# Patient Record
Sex: Female | Born: 1990 | Race: White | Hispanic: No | Smoking: Never smoker
Health system: Southern US, Community
[De-identification: ages and names within clinical notes are randomized; demographics above are authoritative.]

## PROBLEM LIST (undated history)

## (undated) DIAGNOSIS — E538 Deficiency of other specified B group vitamins: Secondary | ICD-10-CM

## (undated) DIAGNOSIS — L709 Acne, unspecified: Secondary | ICD-10-CM

## (undated) DIAGNOSIS — F419 Anxiety disorder, unspecified: Secondary | ICD-10-CM

## (undated) DIAGNOSIS — L409 Psoriasis, unspecified: Secondary | ICD-10-CM

## (undated) DIAGNOSIS — T7840XA Allergy, unspecified, initial encounter: Secondary | ICD-10-CM

## (undated) DIAGNOSIS — D649 Anemia, unspecified: Secondary | ICD-10-CM

## (undated) DIAGNOSIS — E559 Vitamin D deficiency, unspecified: Secondary | ICD-10-CM

## (undated) DIAGNOSIS — F319 Bipolar disorder, unspecified: Secondary | ICD-10-CM

## (undated) DIAGNOSIS — F988 Other specified behavioral and emotional disorders with onset usually occurring in childhood and adolescence: Secondary | ICD-10-CM

## (undated) DIAGNOSIS — E282 Polycystic ovarian syndrome: Secondary | ICD-10-CM

## (undated) DIAGNOSIS — E039 Hypothyroidism, unspecified: Secondary | ICD-10-CM

## (undated) DIAGNOSIS — M2550 Pain in unspecified joint: Secondary | ICD-10-CM

## (undated) DIAGNOSIS — F1990 Other psychoactive substance use, unspecified, uncomplicated: Secondary | ICD-10-CM

## (undated) DIAGNOSIS — M7989 Other specified soft tissue disorders: Secondary | ICD-10-CM

## (undated) DIAGNOSIS — F1010 Alcohol abuse, uncomplicated: Secondary | ICD-10-CM

## (undated) DIAGNOSIS — E079 Disorder of thyroid, unspecified: Secondary | ICD-10-CM

## (undated) DIAGNOSIS — F32A Depression, unspecified: Secondary | ICD-10-CM

## (undated) HISTORY — DX: Disorder of thyroid, unspecified: E07.9

## (undated) HISTORY — DX: Allergy, unspecified, initial encounter: T78.40XA

## (undated) HISTORY — DX: Anemia, unspecified: D64.9

## (undated) HISTORY — DX: Hypothyroidism, unspecified: E03.9

## (undated) HISTORY — DX: Polycystic ovarian syndrome: E28.2

## (undated) HISTORY — DX: Bipolar disorder, unspecified: F31.9

## (undated) HISTORY — DX: Anxiety disorder, unspecified: F41.9

## (undated) HISTORY — DX: Other specified soft tissue disorders: M79.89

## (undated) HISTORY — DX: Psoriasis, unspecified: L40.9

## (undated) HISTORY — DX: Other psychoactive substance use, unspecified, uncomplicated: F19.90

## (undated) HISTORY — DX: Vitamin D deficiency, unspecified: E55.9

## (undated) HISTORY — DX: Alcohol abuse, uncomplicated: F10.10

## (undated) HISTORY — DX: Deficiency of other specified B group vitamins: E53.8

## (undated) HISTORY — DX: Other specified behavioral and emotional disorders with onset usually occurring in childhood and adolescence: F98.8

## (undated) HISTORY — DX: Acne, unspecified: L70.9

## (undated) HISTORY — DX: Pain in unspecified joint: M25.50

---
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
## 2009-03-19 ENCOUNTER — Emergency Department (HOSPITAL_COMMUNITY): Admission: EM | Admit: 2009-03-19 | Discharge: 2009-03-19 | Payer: Self-pay | Admitting: Emergency Medicine

## 2010-10-25 ENCOUNTER — Emergency Department (HOSPITAL_COMMUNITY)
Admission: EM | Admit: 2010-10-25 | Discharge: 2010-10-25 | Payer: Self-pay | Source: Home / Self Care | Admitting: Family Medicine

## 2011-03-10 LAB — CULTURE, ROUTINE-ABSCESS: Gram Stain: NONE SEEN

## 2011-04-08 LAB — URINALYSIS, ROUTINE W REFLEX MICROSCOPIC
Glucose, UA: NEGATIVE mg/dL
Hgb urine dipstick: NEGATIVE
Specific Gravity, Urine: 1.023 (ref 1.005–1.030)
Urobilinogen, UA: 1 mg/dL (ref 0.0–1.0)
pH: 6 (ref 5.0–8.0)

## 2011-04-08 LAB — DIFFERENTIAL
Basophils Absolute: 0 10*3/uL (ref 0.0–0.1)
Basophils Relative: 0 % (ref 0–1)
Monocytes Relative: 8 % (ref 3–12)
Neutro Abs: 3.4 10*3/uL (ref 1.7–7.7)
Neutrophils Relative %: 58 % (ref 43–77)

## 2011-04-08 LAB — COMPREHENSIVE METABOLIC PANEL
ALT: 10 U/L (ref 0–35)
Albumin: 3.4 g/dL — ABNORMAL LOW (ref 3.5–5.2)
Alkaline Phosphatase: 57 U/L (ref 39–117)
Glucose, Bld: 64 mg/dL — ABNORMAL LOW (ref 70–99)
Potassium: 3.7 mEq/L (ref 3.5–5.1)
Sodium: 138 mEq/L (ref 135–145)
Total Protein: 5.7 g/dL — ABNORMAL LOW (ref 6.0–8.3)

## 2011-04-08 LAB — WET PREP, GENITAL: WBC, Wet Prep HPF POC: NONE SEEN

## 2011-04-08 LAB — CBC
MCHC: 35.5 g/dL (ref 30.0–36.0)
RBC: 4.13 MIL/uL (ref 3.87–5.11)

## 2011-04-08 LAB — URINE MICROSCOPIC-ADD ON

## 2011-09-21 LAB — CULTURE, ROUTINE-ABSCESS

## 2011-10-08 LAB — CBC
MCHC: 34.9
MCV: 90.5
RBC: 4.53

## 2011-10-08 LAB — URINALYSIS, ROUTINE W REFLEX MICROSCOPIC
Bilirubin Urine: NEGATIVE
Ketones, ur: NEGATIVE
Nitrite: NEGATIVE
Urobilinogen, UA: 0.2
pH: 8

## 2011-10-08 LAB — RAPID URINE DRUG SCREEN, HOSP PERFORMED
Amphetamines: NOT DETECTED
Cocaine: NOT DETECTED
Opiates: NOT DETECTED
Tetrahydrocannabinol: NOT DETECTED

## 2011-10-08 LAB — DIFFERENTIAL
Basophils Relative: 0
Eosinophils Absolute: 0.3
Eosinophils Relative: 5
Monocytes Relative: 8
Neutrophils Relative %: 65

## 2011-10-30 ENCOUNTER — Emergency Department (HOSPITAL_COMMUNITY)
Admission: EM | Admit: 2011-10-30 | Discharge: 2011-10-30 | Disposition: A | Discharge status: Home / Self Care | Payer: Self-pay | Attending: Emergency Medicine | Admitting: Emergency Medicine

## 2011-10-30 ENCOUNTER — Emergency Department (HOSPITAL_COMMUNITY): Payer: Self-pay

## 2011-10-30 DIAGNOSIS — F341 Dysthymic disorder: Secondary | ICD-10-CM | POA: Insufficient documentation

## 2011-10-30 DIAGNOSIS — R05 Cough: Secondary | ICD-10-CM | POA: Insufficient documentation

## 2011-10-30 DIAGNOSIS — F17200 Nicotine dependence, unspecified, uncomplicated: Secondary | ICD-10-CM | POA: Insufficient documentation

## 2011-10-30 DIAGNOSIS — R059 Cough, unspecified: Secondary | ICD-10-CM | POA: Insufficient documentation

## 2011-10-30 DIAGNOSIS — R0602 Shortness of breath: Secondary | ICD-10-CM | POA: Insufficient documentation

## 2011-10-30 DIAGNOSIS — J40 Bronchitis, not specified as acute or chronic: Secondary | ICD-10-CM | POA: Insufficient documentation

## 2016-01-09 ENCOUNTER — Ambulatory Visit (INDEPENDENT_AMBULATORY_CARE_PROVIDER_SITE_OTHER): Payer: BLUE CROSS/BLUE SHIELD | Admitting: Family Medicine

## 2016-01-09 ENCOUNTER — Ambulatory Visit (INDEPENDENT_AMBULATORY_CARE_PROVIDER_SITE_OTHER): Payer: BLUE CROSS/BLUE SHIELD

## 2016-01-09 VITALS — BP 122/80 | HR 87 | Temp 98.0°F | Resp 16 | Ht 71.0 in | Wt 169.2 lb

## 2016-01-09 DIAGNOSIS — Z658 Other specified problems related to psychosocial circumstances: Secondary | ICD-10-CM

## 2016-01-09 DIAGNOSIS — R0789 Other chest pain: Secondary | ICD-10-CM

## 2016-01-09 DIAGNOSIS — R0781 Pleurodynia: Secondary | ICD-10-CM | POA: Diagnosis not present

## 2016-01-09 DIAGNOSIS — F439 Reaction to severe stress, unspecified: Secondary | ICD-10-CM

## 2016-01-09 LAB — POCT CBC
Granulocyte percent: 68.9 %G (ref 37–80)
HCT, POC: 46 % (ref 37.7–47.9)
HEMOGLOBIN: 15.8 g/dL (ref 12.2–16.2)
LYMPH, POC: 1.5 (ref 0.6–3.4)
MCH: 34 pg — AB (ref 27–31.2)
MCHC: 34.4 g/dL (ref 31.8–35.4)
MCV: 98.8 fL — AB (ref 80–97)
MID (cbc): 0.3 (ref 0–0.9)
MPV: 8.7 fL (ref 0–99.8)
PLATELET COUNT, POC: 185 10*3/uL (ref 142–424)
POC Granulocyte: 3.9 (ref 2–6.9)
POC LYMPH PERCENT: 26.1 %L (ref 10–50)
POC MID %: 5 % (ref 0–12)
RBC: 4.65 M/uL (ref 4.04–5.48)
RDW, POC: 11.4 %
WBC: 5.6 10*3/uL (ref 4.6–10.2)

## 2016-01-09 LAB — D-DIMER, QUANTITATIVE (NOT AT ARMC): D DIMER QUANT: 0.26 ug{FEU}/mL (ref 0.00–0.48)

## 2016-01-09 NOTE — Progress Notes (Signed)
Subjective:    Patient ID: Wanda Torres, female    DOB: [birth year], 25 y.o.   MRN: [id number]  HPI Wanda Torres is a 25 y.o. female  C/o stabbing chest pain on L front of chest. Now on 4th day.  NKI.  No new exercises, does Yoga everyday, but not inpast 4 days due to pain. Initially had some soreness in L arm, but that is now resolved. More sore to bend forward or lying down, but is able to press and reproduce pain pressing on upper left chest wall. Now hurts to clear throat, talking, hurts to take a deep breath. Stopped Adderall on 2nd day in case it was heart related. Did take Adderall today, but still feels fatigued. No fever. No dyspnea, just pain with deep breathe. No cough or recent URI.   Has been under a lot of stress recently.  Husband with alcohol W/D recently. Hx of anxiety and panic attacks in past, but current sx's feel different. No SI/HI, just stressed. Drank 1/2 bottle wine last night, but usually only 2-3 per week, no hx of DUI or problems with alcohol. No illicit drug use.   No recent prolonged car travel, but flew to [place] (2 and a half hours) 1 week ago, no recent calf pain or swelling.no hx of DVT. No OCP's, but is smoker.    Tx: ibuprofen - 855m once initially, then 8040mup to every 6 hours.  Helps some.    Review of Systems  Constitutional: Negative for fever and chills.  HENT: Negative for congestion.   Respiratory: Negative for cough, chest tightness and shortness of breath.   Cardiovascular: Positive for chest pain (sharp). Negative for palpitations and leg swelling.  Musculoskeletal: Positive for arthralgias (l arm initailly, not now. ).       Objective:   Physical Exam  Constitutional: She is oriented to person, place, and time. She appears well-developed and well-nourished.  HENT:  Head: Normocephalic and atraumatic.  Eyes: Conjunctivae and EOM are normal. Pupils are equal, round, and reactive to light.  Neck: Normal range of motion.  Carotid bruit is not present.  Cardiovascular: Normal rate, regular rhythm, normal heart sounds and intact distal pulses.   Pulmonary/Chest: Effort normal and breath sounds normal. She exhibits tenderness. She exhibits no crepitus, no deformity and no swelling.    Abdominal: Soft. She exhibits no pulsatile midline mass. There is no tenderness.  Musculoskeletal:       Cervical back: She exhibits normal range of motion, no tenderness and no bony tenderness.  Calves nt, negative homan's, no LE edema.   Neurological: She is alert and oriented to person, place, and time.  Skin: Skin is warm and dry.  Psychiatric: She has a normal mood and affect. Her behavior is normal.  Vitals reviewed.  EKG: SR, no acute findings.   [facility] reading (PRIMARY) by  Dr. GrCarlota Raspberry CXR: no apparent pneumothorax or acute findings.      Assessment & Plan:   Wanda KAMAs a [age].o. female Pleuritic chest pain - Plan: EKG 12-Lead, DG Chest 2 View, POCT CBC, Sedimentation Rate, D-dimer, [facility] Chest wall pain - Plan: EKG 12-Lead, DG Chest 2 View, POCT CBC, Sedimentation Rate, D[facility]dimer, [facility] Situational stress  Appears to be chest wall pain as reproducible in office. D dimer and ESR were ultimately normal range, and EKG, CXR appeared ok.  Doubt pericarditis, PE, or cardiac cause.   -symptomatic  care discussed with heat or ice and advil otc - dosing discussed and to take with food.   -rtc/er precautions discussed if persistent or worsening  -situational stressors at home, but appears to be coping ok overall - advised to rtc to discuss further if needed.   Meds ordered this encounter  Medications  . amphetamine-dextroamphetamine (ADDERALL) 10 MG tablet    Sig: Take 10 mg by mouth daily with breakfast.   Patient Instructions  I suspect your pain is coming form the chest wall, but will check some blood tests for other conditions as we discussed.  Heat or ice to  affected area if it feels better, ibuprofen 600-863m every 6-8 hours (take with food), relative rest from upper body exercises for now.  We will call you about the blood test results, and if other testing needed will let you know.  Return to the clinic or go to the nearest emergency room if any of your symptoms worsen or new symptoms occur.  Chest Wall Pain Chest wall pain is pain in or around the bones and muscles of your chest. Sometimes, an injury causes this pain. Sometimes, the cause may not be known. This pain may take several weeks or longer to get better. HOME CARE INSTRUCTIONS  Pay attention to any changes in your symptoms. Take these actions to help with your pain:   Rest as told by your health care provider.   Avoid activities that cause pain. These include any activities that use your chest muscles or your abdominal and side muscles to lift heavy items.   If directed, apply ice to the painful area:  Put ice in a plastic bag.  Place a towel between your skin and the bag.  Leave the ice on for 20 minutes, 2-3 times per day.  Take over-the-counter and prescription medicines only as told by your health care provider.  Do not use tobacco products, including cigarettes, chewing tobacco, and e-cigarettes. If you need help quitting, ask your health care provider.  Keep all follow-up visits as told by your health care provider. This is important. SEEK MEDICAL CARE IF:  You have a fever.  Your chest pain becomes worse.  You have new symptoms. SEEK IMMEDIATE MEDICAL CARE IF:  You have nausea or vomiting.  You feel sweaty or light-headed.  You have a cough with phlegm (sputum) or you cough up blood.  You develop shortness of breath.   This information is not intended to replace advice given to you by your health care provider. Make sure you discuss any questions you have with your health care provider.   Document Released: 12/13/2005 Document Revised: 09/03/2015  Document Reviewed: 03/10/2015 Elsevier Interactive Patient Education [employer]     I personally performed the services described in this documentation, which was scribed in my presence. The recorded information has been reviewed and considered, and addended by me as needed.

## 2016-01-09 NOTE — Patient Instructions (Addendum)
I suspect your pain is coming form the chest wall, but will check some blood tests for other conditions as we discussed.  Heat or ice to affected area if it feels better, ibuprofen 600-800mg  every 6-8 hours (take with food), relative rest from upper body exercises for now.  We will call you about the blood test results, and if other testing needed will let you know.  Return to the clinic or go to the nearest emergency room if any of your symptoms worsen or new symptoms occur.  Chest Wall Pain Chest wall pain is pain in or around the bones and muscles of your chest. Sometimes, an injury causes this pain. Sometimes, the cause may not be known. This pain may take several weeks or longer to get better. HOME CARE INSTRUCTIONS  Pay attention to any changes in your symptoms. Take these actions to help with your pain:   Rest as told by your health care provider.   Avoid activities that cause pain. These include any activities that use your chest muscles or your abdominal and side muscles to lift heavy items.   If directed, apply ice to the painful area:  Put ice in a plastic bag.  Place a towel between your skin and the bag.  Leave the ice on for 20 minutes, 2-3 times per day.  Take over-the-counter and prescription medicines only as told by your health care provider.  Do not use tobacco products, including cigarettes, chewing tobacco, and e-cigarettes. If you need help quitting, ask your health care provider.  Keep all follow-up visits as told by your health care provider. This is important. SEEK MEDICAL CARE IF:  You have a fever.  Your chest pain becomes worse.  You have new symptoms. SEEK IMMEDIATE MEDICAL CARE IF:  You have nausea or vomiting.  You feel sweaty or light-headed.  You have a cough with phlegm (sputum) or you cough up blood.  You develop shortness of breath.   This information is not intended to replace advice given to you by your health care provider. Make  sure you discuss any questions you have with your health care provider.   Document Released: 12/13/2005 Document Revised: 09/03/2015 Document Reviewed: 03/10/2015 Elsevier Interactive Patient Education [employer]

## 2016-01-10 LAB — SEDIMENTATION RATE: Sed Rate: 3 mm/hr (ref 0–20)

## 2016-01-26 ENCOUNTER — Encounter: Payer: Self-pay | Admitting: Family Medicine

## 2016-01-26 ENCOUNTER — Ambulatory Visit (INDEPENDENT_AMBULATORY_CARE_PROVIDER_SITE_OTHER): Payer: BLUE CROSS/BLUE SHIELD | Admitting: Family Medicine

## 2016-01-26 ENCOUNTER — Ambulatory Visit: Payer: Self-pay | Admitting: Family Medicine

## 2016-01-26 VITALS — BP 114/82 | HR 92 | Temp 98.4°F | Resp 16 | Ht 71.75 in | Wt 168.8 lb

## 2016-01-26 DIAGNOSIS — F418 Other specified anxiety disorders: Secondary | ICD-10-CM

## 2016-01-26 DIAGNOSIS — G4700 Insomnia, unspecified: Secondary | ICD-10-CM

## 2016-01-26 DIAGNOSIS — E282 Polycystic ovarian syndrome: Secondary | ICD-10-CM

## 2016-01-26 DIAGNOSIS — L709 Acne, unspecified: Secondary | ICD-10-CM

## 2016-01-26 MED ORDER — CLONAZEPAM 0.5 MG PO TABS: 0.2500 mg | ORAL_TABLET | Freq: Every evening | ORAL | Status: DC | PRN

## 2016-01-26 MED ORDER — SPIRONOLACTONE 25 MG PO TABS: 25.0000 mg | ORAL_TABLET | Freq: Every day | ORAL | Status: DC

## 2016-01-26 NOTE — Progress Notes (Signed)
Subjective:  By signing my name below, I, Wanda Torres, attest that this documentation has been prepared under the direction and in the presence of Wanda Ray, MD. Electronically Signed: Moises Torres, [place]. 01/26/2016 , 5:28 PM .  Patient was seen in Room 26 .   Patient ID: Wanda Torres, female    DOB: 08-01-1991, 25 y.o.   MRN: [id number] Chief Complaint  Patient presents with  . Follow-up  . Anxiety    "no better", can't sleep  . have taken melantonin, not working  . depression scale during triage    score 14   HPI Wanda Torres is a 25 y.o. female Here for follow up. Last seen by me approximately 3 weeks ago for situational stress and chest wall pain. Suspected chest wall pain at that time with normal d-dimer, normal sed rate, reassuring EKG and chest xray. Based on our discussion at that time, she is coping okay with her stressors. Today, here for worsening symptoms of anxiety and difficulty sleeping.   Anxiety She's taken melatonin without any relief. She's still having situational shortness of breath and chest wall pain. She's been stressed due to getting her husband to treatment at [facility]. She had to move out to pay for her husband's treatment. She's currently staying with her friends and has had trouble sleeping. She tends to stay up until around 4:00AM. She's tried to meditation (via Calm App), melatonin, yoga, and walking her dogs without relief at the moment. She has an appointment in 2 weeks, covered by [employer]. She has coffee in the morning. She denies naps during the day.   Medications She denies taking xanax, or klonopin in the past.  She's taken [place] in the past when she had her daughter.  She's still on adderall.   She works at [employer], Librarian, [place]   Depression Depression screen [facility] 2/9 01/26/2016 01/09/2016  Decreased Interest 1 0  Down, Depressed, Hopeless 3 0  PHQ - 2 Score 4 0  Altered sleeping 3 -  Tired,  decreased energy 0 -  Change in appetite 1 -  Feeling bad or failure about yourself  1 -  Trouble concentrating 2 -  Moving slowly or fidgety/restless 3 -  Suicidal thoughts 0 -  PHQ-9 Score 14 -  Difficult doing work/chores Very difficult -   She denies SI and HI. She denies illicit drug use.   Acne She took spironolactone for acne and facial hair 6 years ago. She's on PCS and her periods are normal.   There are no active problems to display for this patient.  Past Medical History  Diagnosis Date  . Allergy   . Anemia   . Anxiety   . Thyroid disease    No past surgical history on file. No Known Allergies Prior to Admission medications   Medication Sig Start Date End Date Taking? Authorizing Provider  amphetamine-dextroamphetamine (ADDERALL) 10 MG tablet Take 10 mg by mouth daily with breakfast.    Historical Provider, MD   Social History   Social History  . Marital Status: Single    Spouse Name: N/A  . Number of Children: N/A  . Years of Education: N/A   Occupational History  . Not on file.   Social History Main Topics  . Smoking status: Never Smoker   . Smokeless tobacco: Not on file  . Alcohol Use: Not on file  . Drug Use: Not on file  . Sexual Activity: Not on file  Other Topics Concern  . Not on file   Social History Narrative   Review of Systems  Constitutional: Positive for fatigue. Negative for fever and chills.  Gastrointestinal: Negative for nausea and vomiting.  Skin: Positive for rash. Negative for wound.  Neurological: Negative for dizziness, weakness, numbness and headaches.  Psychiatric/Behavioral: Positive for sleep disturbance. Negative for suicidal ideas and self-injury.       Objective:   Physical Exam  Constitutional: She is oriented to person, place, and time. She appears well-developed and well-nourished. No distress.  HENT:  Head: Normocephalic and atraumatic.  Eyes: EOM are normal. Pupils are equal, round, and reactive to  light.  Neck: Neck supple.  Cardiovascular: Normal rate.   Pulmonary/Chest: Effort normal. No respiratory distress.  Musculoskeletal: Normal range of motion.  Neurological: She is alert and oriented to person, place, and time.  Skin: Skin is warm and dry.  Few scratched excoriated comedones, cystic appearance on face bilaterally, nothing on upper back or chest, no other rash  Psychiatric: She has a normal mood and affect. Her behavior is normal.  Nursing note and vitals reviewed.   Filed Vitals:   01/26/16 1631  BP: 114/82  Pulse: 92  Temp: 98.4 F (36.9 C)  TempSrc: Oral  Resp: 16  Height: 5' 11.75" (1.822 m)  Weight: 168 lb 12.8 oz (76.567 kg)  SpO2: 98%      Assessment & Plan:   AMMI HUTT is a 25 y.o. female Insomnia - Plan: clonazePAM (KLONOPIN) 0.5 MG tablet Situational anxiety - Plan: clonazePAM (KLONOPIN) 0.5 MG tablet  - situational stressor wit husband in alcohol rehab and new living situation.   -continue to try relaxation techniques, yoga, exercise in afternoons, avoid afternoon caffeine.   -follow up with therapist as planned, [place] meeting  -short term klonopin at bedtime discussed, taper off as soon as sx's improve.   -follow up in 1 month to decide if daily med needed.   PCOS (polycystic ovarian syndrome) - Plan: spironolactone (ALDACTONE) 25 MG tablet Acne, unspecified acne type - Plan: spironolactone (ALDACTONE) 25 MG tablet  - discussed OCP as first line, but HA's with ocp's and tolerated spirinolactone in past.   -start low dose spirinolactone, SED, orthostatic/hypotension precautions. May need to increase dose as 36m and up may be needed for PCOS sx's.   - discuss further next OV.     Meds ordered this encounter  Medications  . clonazePAM (KLONOPIN) 0.5 MG tablet    Sig: Take 0.5-1 tablets (0.25-0.5 mg total) by mouth at bedtime as needed for anxiety.    Dispense:  15 tablet    Refill:  0  . spironolactone (ALDACTONE) 25 MG tablet     Sig: Take 1 tablet (25 mg total) by mouth daily.    Dispense:  30 tablet    Refill:  1   Patient Instructions  Check into dose of spirinolactone previously used, but start with 1/2 pill per day for first week, then full pill per day.  Klonopin at bedtime if needed, but keep appointment with therapist as planned and try other techniques for insomnia.  recheck in next month. sooner if worse.  Insomnia Insomnia is a sleep disorder that makes it difficult to fall asleep or to stay asleep. Insomnia can cause tiredness (fatigue), low energy, difficulty concentrating, mood swings, and poor performance at work or school.  There are three different ways to classify insomnia:  Difficulty falling asleep.  Difficulty staying asleep.  Waking up too early in  the morning. Any type of insomnia can be long-term (chronic) or short-term (acute). Both are common. Short-term insomnia usually lasts for three months or less. Chronic insomnia occurs at least three times a week for longer than three months. CAUSES  Insomnia may be caused by another condition, situation, or substance, such as:  Anxiety.  Certain medicines.  Gastroesophageal reflux disease (GERD) or other gastrointestinal conditions.  Asthma or other breathing conditions.  Restless legs syndrome, sleep apnea, or other sleep disorders.  Chronic pain.  Menopause. This may include hot flashes.  Stroke.  Abuse of alcohol, tobacco, or illegal drugs.  Depression.  Caffeine.   Neurological disorders, such as Alzheimer disease.  An overactive thyroid (hyperthyroidism). The cause of insomnia may not be known. RISK FACTORS Risk factors for insomnia include:  Gender. Women are more commonly affected than men.  Age. Insomnia is more common as you get older.  Stress. This may involve your professional or personal life.  Income. Insomnia is more common in people with lower income.  Lack of exercise.   Irregular work schedule  or night shifts.  Traveling between different time zones. SIGNS AND SYMPTOMS If you have insomnia, trouble falling asleep or trouble staying asleep is the main symptom. This may lead to other symptoms, such as:  Feeling fatigued.  Feeling nervous about going to sleep.  Not feeling rested in the morning.  Having trouble concentrating.  Feeling irritable, anxious, or depressed. TREATMENT  Treatment for insomnia depends on the cause. If your insomnia is caused by an underlying condition, treatment will focus on addressing the condition. Treatment may also include:   Medicines to help you sleep.  Counseling or therapy.  Lifestyle adjustments. HOME CARE INSTRUCTIONS   Take medicines only as directed by your health care provider.  Keep regular sleeping and waking hours. Avoid naps.  Keep a sleep diary to help you and your health care provider figure out what could be causing your insomnia. Include:   When you sleep.  When you wake up during the night.  How well you sleep.   How rested you feel the next day.  Any side effects of medicines you are taking.  What you eat and drink.   Make your bedroom a comfortable place where it is easy to fall asleep:  Put up shades or special blackout curtains to block light from outside.  Use a white noise machine to block noise.  Keep the temperature cool.   Exercise regularly as directed by your health care provider. Avoid exercising right before bedtime.  Use relaxation techniques to manage stress. Ask your health care provider to suggest some techniques that may work well for you. These may include:  Breathing exercises.  Routines to release muscle tension.  Visualizing peaceful scenes.  Cut back on alcohol, caffeinated beverages, and cigarettes, especially close to bedtime. These can disrupt your sleep.  Do not overeat or eat spicy foods right before bedtime. This can lead to digestive discomfort that can make it hard  for you to sleep.  Limit screen use before bedtime. This includes:  Watching TV.  Using your smartphone, tablet, and computer.  Stick to a routine. This can help you fall asleep faster. Try to do a quiet activity, brush your teeth, and go to bed at the same time each night.  Get out of bed if you are still awake after 15 minutes of trying to sleep. Keep the lights down, but try reading or doing a quiet activity. When you feel  sleepy, go back to bed.  Make sure that you drive carefully. Avoid driving if you feel very sleepy.  Keep all follow-up appointments as directed by your health care provider. This is important. SEEK MEDICAL CARE IF:   You are tired throughout the day or have trouble in your daily routine due to sleepiness.  You continue to have sleep problems or your sleep problems get worse. SEEK IMMEDIATE MEDICAL CARE IF:   You have serious thoughts about hurting yourself or someone else.   This information is not intended to replace advice given to you by your health care provider. Make sure you discuss any questions you have with your health care provider.   Document Released: 12/10/2000 Document Revised: 09/03/2015 Document Reviewed: 09/13/2014 Elsevier Interactive Patient Education 2016 [place] and Stress Management Stress is a normal reaction to life events. It is what you feel when life demands more than you are used to or more than you can handle. Some stress can be useful. For example, the stress reaction can help you catch the last bus of the day, study for a test, or meet a deadline at work. But stress that occurs too often or for too long can cause problems. It can affect your emotional health and interfere with relationships and normal daily activities. Too much stress can weaken your immune system and increase your risk for physical illness. If you already have a medical problem, stress can make it worse. CAUSES  All sorts of life events may cause  stress. An event that causes stress for one person may not be stressful for another person. Major life events commonly cause stress. These may be positive or negative. Examples include losing your job, moving into a new home, getting married, having a baby, or losing a loved one. Less obvious life events may also cause stress, especially if they occur day after day or in combination. Examples include working long hours, driving in traffic, caring for children, being in debt, or being in a difficult relationship. SIGNS AND SYMPTOMS Stress may cause emotional symptoms including, the following:  Anxiety. This is feeling worried, afraid, on edge, overwhelmed, or out of control.  Anger. This is feeling irritated or impatient.  Depression. This is feeling sad, down, helpless, or guilty.  Difficulty focusing, remembering, or making decisions. Stress may cause physical symptoms, including the following:   Aches and pains. These may affect your head, neck, back, stomach, or other areas of your body.  Tight muscles or clenched jaw.  Low energy or trouble sleeping. Stress may cause unhealthy behaviors, including the following:   Eating to feel better (overeating) or skipping meals.  Sleeping too little, too much, or both.  Working too much or putting off tasks (procrastination).  Smoking, drinking alcohol, or using drugs to feel better. DIAGNOSIS  Stress is diagnosed through an assessment by your health care provider. Your health care provider will ask questions about your symptoms and any stressful life events.Your health care provider will also ask about your medical history and may order Torres tests or other tests. Certain medical conditions and medicine can cause physical symptoms similar to stress. Mental illness can cause emotional symptoms and unhealthy behaviors similar to stress. Your health care provider may refer you to a mental health professional for further evaluation.  TREATMENT    Stress management is the recommended treatment for stress.The goals of stress management are reducing stressful life events and coping with stress in healthy ways.  Techniques for reducing  stressful life events include the following:  Stress identification. Self-monitor for stress and identify what causes stress for you. These skills may help you to avoid some stressful events.  Time management. Set your priorities, keep a calendar of events, and learn to say "no." These tools can help you avoid making too many commitments. Techniques for coping with stress include the following:  Rethinking the problem. Try to think realistically about stressful events rather than ignoring them or overreacting. Try to find the positives in a stressful situation rather than focusing on the negatives.  Exercise. Physical exercise can release both physical and emotional tension. The key is to find a form of exercise you enjoy and do it regularly.  Relaxation techniques. These relax the body and mind. Examples include yoga, meditation, tai chi, biofeedback, deep breathing, progressive muscle relaxation, listening to music, being out in nature, journaling, and other hobbies. Again, the key is to find one or more that you enjoy and can do regularly.  Healthy lifestyle. Eat a balanced diet, get plenty of sleep, and do not smoke. Avoid using alcohol or drugs to relax.  Strong support network. Spend time with family, friends, or other people you enjoy being around.Express your feelings and talk things over with someone you trust. Counseling or talktherapy with a mental health professional may be helpful if you are having difficulty managing stress on your own. Medicine is typically not recommended for the treatment of stress.Talk to your health care provider if you think you need medicine for symptoms of stress. HOME CARE INSTRUCTIONS  Keep all follow-up visits as directed by your health care provider.  Take all  medicines as directed by your health care provider. SEEK MEDICAL CARE IF:  Your symptoms get worse or you start having new symptoms.  You feel overwhelmed by your problems and can no longer manage them on your own. SEEK IMMEDIATE MEDICAL CARE IF:  You feel like hurting yourself or someone else.   This information is not intended to replace advice given to you by your health care provider. Make sure you discuss any questions you have with your health care provider.   Document Released: 06/08/2001 Document Revised: 01/03/2015 Document Reviewed: 08/07/2013 Elsevier Interactive Patient Education [employer].     I personally performed the services described in this documentation, which was scribed in my presence. The recorded information has been reviewed and considered, and addended by me as needed.

## 2016-01-26 NOTE — Patient Instructions (Signed)
Check into dose of spirinolactone previously used, but start with 1/2 pill per day for first week, then full pill per day.  Klonopin at bedtime if needed, but keep appointment with therapist as planned and try other techniques for insomnia.  recheck in next month. sooner if worse.  Insomnia Insomnia is a sleep disorder that makes it difficult to fall asleep or to stay asleep. Insomnia can cause tiredness (fatigue), low energy, difficulty concentrating, mood swings, and poor performance at work or school.  There are three different ways to classify insomnia:  Difficulty falling asleep.  Difficulty staying asleep.  Waking up too early in the morning. Any type of insomnia can be long-term (chronic) or short-term (acute). Both are common. Short-term insomnia usually lasts for three months or less. Chronic insomnia occurs at least three times a week for longer than three months. CAUSES  Insomnia may be caused by another condition, situation, or substance, such as:  Anxiety.  Certain medicines.  Gastroesophageal reflux disease (GERD) or other gastrointestinal conditions.  Asthma or other breathing conditions.  Restless legs syndrome, sleep apnea, or other sleep disorders.  Chronic pain.  Menopause. This may include hot flashes.  Stroke.  Abuse of alcohol, tobacco, or illegal drugs.  Depression.  Caffeine.   Neurological disorders, such as Alzheimer disease.  An overactive thyroid (hyperthyroidism). The cause of insomnia may not be known. RISK FACTORS Risk factors for insomnia include:  Gender. Women are more commonly affected than men.  Age. Insomnia is more common as you get older.  Stress. This may involve your professional or personal life.  Income. Insomnia is more common in people with lower income.  Lack of exercise.   Irregular work schedule or night shifts.  Traveling between different time zones. SIGNS AND SYMPTOMS If you have insomnia, trouble  falling asleep or trouble staying asleep is the main symptom. This may lead to other symptoms, such as:  Feeling fatigued.  Feeling nervous about going to sleep.  Not feeling rested in the morning.  Having trouble concentrating.  Feeling irritable, anxious, or depressed. TREATMENT  Treatment for insomnia depends on the cause. If your insomnia is caused by an underlying condition, treatment will focus on addressing the condition. Treatment may also include:   Medicines to help you sleep.  Counseling or therapy.  Lifestyle adjustments. HOME CARE INSTRUCTIONS   Take medicines only as directed by your health care provider.  Keep regular sleeping and waking hours. Avoid naps.  Keep a sleep diary to help you and your health care provider figure out what could be causing your insomnia. Include:   When you sleep.  When you wake up during the night.  How well you sleep.   How rested you feel the next day.  Any side effects of medicines you are taking.  What you eat and drink.   Make your bedroom a comfortable place where it is easy to fall asleep:  Put up shades or special blackout curtains to block light from outside.  Use a white noise machine to block noise.  Keep the temperature cool.   Exercise regularly as directed by your health care provider. Avoid exercising right before bedtime.  Use relaxation techniques to manage stress. Ask your health care provider to suggest some techniques that may work well for you. These may include:  Breathing exercises.  Routines to release muscle tension.  Visualizing peaceful scenes.  Cut back on alcohol, caffeinated beverages, and cigarettes, especially close to bedtime. These can disrupt your sleep.  Do not overeat or eat spicy foods right before bedtime. This can lead to digestive discomfort that can make it hard for you to sleep.  Limit screen use before bedtime. This includes:  Watching TV.  Using your smartphone,  tablet, and computer.  Stick to a routine. This can help you fall asleep faster. Try to do a quiet activity, brush your teeth, and go to bed at the same time each night.  Get out of bed if you are still awake after 15 minutes of trying to sleep. Keep the lights down, but try reading or doing a quiet activity. When you feel sleepy, go back to bed.  Make sure that you drive carefully. Avoid driving if you feel very sleepy.  Keep all follow-up appointments as directed by your health care provider. This is important. SEEK MEDICAL CARE IF:   You are tired throughout the day or have trouble in your daily routine due to sleepiness.  You continue to have sleep problems or your sleep problems get worse. SEEK IMMEDIATE MEDICAL CARE IF:   You have serious thoughts about hurting yourself or someone else.   This information is not intended to replace advice given to you by your health care provider. Make sure you discuss any questions you have with your health care provider.   Document Released: 12/10/2000 Document Revised: 09/03/2015 Document Reviewed: 09/13/2014 Elsevier Interactive Patient Education 2016 [place] and Stress Management Stress is a normal reaction to life events. It is what you feel when life demands more than you are used to or more than you can handle. Some stress can be useful. For example, the stress reaction can help you catch the last bus of the day, study for a test, or meet a deadline at work. But stress that occurs too often or for too long can cause problems. It can affect your emotional health and interfere with relationships and normal daily activities. Too much stress can weaken your immune system and increase your risk for physical illness. If you already have a medical problem, stress can make it worse. CAUSES  All sorts of life events may cause stress. An event that causes stress for one person may not be stressful for another person. Major life events  commonly cause stress. These may be positive or negative. Examples include losing your job, moving into a new home, getting married, having a baby, or losing a loved one. Less obvious life events may also cause stress, especially if they occur day after day or in combination. Examples include working long hours, driving in traffic, caring for children, being in debt, or being in a difficult relationship. SIGNS AND SYMPTOMS Stress may cause emotional symptoms including, the following:  Anxiety. This is feeling worried, afraid, on edge, overwhelmed, or out of control.  Anger. This is feeling irritated or impatient.  Depression. This is feeling sad, down, helpless, or guilty.  Difficulty focusing, remembering, or making decisions. Stress may cause physical symptoms, including the following:   Aches and pains. These may affect your head, neck, back, stomach, or other areas of your body.  Tight muscles or clenched jaw.  Low energy or trouble sleeping. Stress may cause unhealthy behaviors, including the following:   Eating to feel better (overeating) or skipping meals.  Sleeping too little, too much, or both.  Working too much or putting off tasks (procrastination).  Smoking, drinking alcohol, or using drugs to feel better. DIAGNOSIS  Stress is diagnosed through an assessment by your health  care provider. Your health care provider will ask questions about your symptoms and any stressful life events.Your health care provider will also ask about your medical history and may order blood tests or other tests. Certain medical conditions and medicine can cause physical symptoms similar to stress. Mental illness can cause emotional symptoms and unhealthy behaviors similar to stress. Your health care provider may refer you to a mental health professional for further evaluation.  TREATMENT  Stress management is the recommended treatment for stress.The goals of stress management are reducing  stressful life events and coping with stress in healthy ways.  Techniques for reducing stressful life events include the following:  Stress identification. Self-monitor for stress and identify what causes stress for you. These skills may help you to avoid some stressful events.  Time management. Set your priorities, keep a calendar of events, and learn to say "no." These tools can help you avoid making too many commitments. Techniques for coping with stress include the following:  Rethinking the problem. Try to think realistically about stressful events rather than ignoring them or overreacting. Try to find the positives in a stressful situation rather than focusing on the negatives.  Exercise. Physical exercise can release both physical and emotional tension. The key is to find a form of exercise you enjoy and do it regularly.  Relaxation techniques. These relax the body and mind. Examples include yoga, meditation, tai chi, biofeedback, deep breathing, progressive muscle relaxation, listening to music, being out in nature, journaling, and other hobbies. Again, the key is to find one or more that you enjoy and can do regularly.  Healthy lifestyle. Eat a balanced diet, get plenty of sleep, and do not smoke. Avoid using alcohol or drugs to relax.  Strong support network. Spend time with family, friends, or other people you enjoy being around.Express your feelings and talk things over with someone you trust. Counseling or talktherapy with a mental health professional may be helpful if you are having difficulty managing stress on your own. Medicine is typically not recommended for the treatment of stress.Talk to your health care provider if you think you need medicine for symptoms of stress. HOME CARE INSTRUCTIONS  Keep all follow-up visits as directed by your health care provider.  Take all medicines as directed by your health care provider. SEEK MEDICAL CARE IF:  Your symptoms get worse or  you start having new symptoms.  You feel overwhelmed by your problems and can no longer manage them on your own. SEEK IMMEDIATE MEDICAL CARE IF:  You feel like hurting yourself or someone else.   This information is not intended to replace advice given to you by your health care provider. Make sure you discuss any questions you have with your health care provider.   Document Released: 06/08/2001 Document Revised: 01/03/2015 Document Reviewed: 08/07/2013 Elsevier Interactive Patient Education [employer].

## 2016-02-13 ENCOUNTER — Telehealth: Payer: Self-pay

## 2016-02-13 NOTE — Telephone Encounter (Signed)
Pt needs a refill on Adderall  Please advise when ready  [phone]

## 2016-02-16 NOTE — Telephone Encounter (Signed)
Dr Neva Seat, I see in your OV notes from 1/30 that you noted that pt is taking Adderall, but we have not Rxd it for pt before. Did you discuss Rxing this for pt or does pt need to RTC now to discuss this rather than waiting for f/up that you planned for end of month?

## 2016-02-16 NOTE — Telephone Encounter (Signed)
We have not been prescribing that medicine. I believe she had a different provider for Adderall.

## 2016-02-17 NOTE — Telephone Encounter (Signed)
Left message we have not prescribed the adderall,

## 2016-02-19 ENCOUNTER — Ambulatory Visit (INDEPENDENT_AMBULATORY_CARE_PROVIDER_SITE_OTHER): Payer: BLUE CROSS/BLUE SHIELD | Admitting: Family Medicine

## 2016-02-19 VITALS — BP 102/72 | HR 81 | Temp 98.7°F | Resp 16 | Ht 71.0 in | Wt 168.6 lb

## 2016-02-19 DIAGNOSIS — F39 Unspecified mood [affective] disorder: Secondary | ICD-10-CM

## 2016-02-19 DIAGNOSIS — L709 Acne, unspecified: Secondary | ICD-10-CM | POA: Diagnosis not present

## 2016-02-19 DIAGNOSIS — E282 Polycystic ovarian syndrome: Secondary | ICD-10-CM | POA: Diagnosis not present

## 2016-02-19 MED ORDER — AMPHETAMINE-DEXTROAMPHETAMINE 10 MG PO TABS: 20.0000 mg | ORAL_TABLET | Freq: Every day | ORAL | Status: DC

## 2016-02-19 MED ORDER — SPIRONOLACTONE 25 MG PO TABS: 25.0000 mg | ORAL_TABLET | Freq: Every day | ORAL | Status: DC

## 2016-02-19 NOTE — Patient Instructions (Signed)
Let us know when you have run out of your medicine so we can refill it.

## 2016-02-19 NOTE — Progress Notes (Signed)
By signing my name below, I, Stann Ore, attest that this documentation has been prepared under the direction and in the presence of Elvina Sidle, MD. Electronically Signed: Stann Ore, Scribe. 02/19/2016 , 4:36 PM .  Patient was seen in room 9 .   Patient ID: Wanda Torres MRN: [id number], DOB: 06/10/1991, 25 y.o. Date of Encounter: 02/19/2016  Primary Physician: No primary care provider on file.  Chief Complaint:  Chief Complaint  Patient presents with  . Medication Refill    adderall    HPI:  Wanda Torres is a 25 y.o. female who presents to [facility] for medication refill of adderall. She has  tablets, and takes 2 daily on days she works. She just takes 1 tablet on days she doesn't work.   She also takes spironolactone for her acne.   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   Past Medical History  Diagnosis Date  . Allergy   . Anemia   . Anxiety   . Thyroid disease      Home Meds: Prior to Admission medications   Medication Sig Start Date End Date Taking? Authorizing Provider  amphetamine-dextroamphetamine (ADDERALL) 10 MG tablet Take 10 mg by mouth daily with breakfast.   Yes Historical Provider, MD  spironolactone (ALDACTONE) 25 MG tablet Take 1 tablet (25 mg total) by mouth daily. Patient taking differently: Take 12.5 mg by mouth daily.  01/26/16  Yes Shade Flood, MD  clonazePAM (KLONOPIN) 0.5 MG tablet Take 0.5-1 tablets (0.25-0.5 mg total) by mouth at bedtime as needed for anxiety. Patient not taking: Reported on 02/19/2016 01/26/16   Shade Flood, MD    Allergies: No Known Allergies  Social History   Social History  . Marital Status: Single    Spouse Name: N/A  . Number of Children: N/A  . Years of Education: N/A   Occupational History  . Not on file.   Social History Main Topics  . Smoking status: Never Smoker   . Smokeless tobacco: Not on file  . Alcohol Use: Not on file   . Drug Use: Not on file  . Sexual Activity: Not on file   Other Topics Concern  . Not on file   Social History Narrative     Review of Systems: Constitutional: negative for fever, chills, night sweats, weight changes, or fatigue  HEENT: negative for vision changes, hearing loss, congestion, rhinorrhea, ST, epistaxis, or sinus pressure Cardiovascular: negative for chest pain or palpitations Respiratory: negative for hemoptysis, wheezing, shortness of breath, or cough Abdominal: negative for abdominal pain, nausea, vomiting, diarrhea, or constipation Dermatological: negative for rash Neurologic: negative for headache, dizziness, or syncope All other systems reviewed and are otherwise negative with the exception to those above and in the HPI.  Physical Exam: Blood pressure 102/72, pulse 81, temperature 98.7 F (37.1 C), temperature source Oral, resp. rate 16, height  (1.803 m), weight 168 lb 9.6 oz (76.476 kg), last menstrual period 02/15/2016, SpO2 99 %., Body mass index is 23.53 kg/(m^2). General: Well developed, well nourished, in no acute distress. Head: Normocephalic, atraumatic, eyes without discharge, sclera non-icteric, nares are without discharge. Bilateral auditory canals clear, TM's are without perforation, pearly grey and translucent with reflective cone of light bilaterally. Oral cavity moist, posterior pharynx without exudate, erythema, peritonsillar abscess, or post nasal drip.  Neck: Supple. No thyromegaly. Full ROM. No lymphadenopathy. Lungs: Clear bilaterally to auscultation without wheezes, rales, or rhonchi. Breathing is unlabored. Heart:  RRR with S1 S2. No murmurs, rubs, or gallops appreciated. Abdomen: Soft, non-tender, non-distended with normoactive bowel sounds. No hepatomegaly. No rebound/guarding. No obvious abdominal masses. Msk:  Strength and tone normal for age. Extremities/Skin: Warm and dry. Mild cystic acne Neuro: Alert and oriented X 3. Moves all  extremities spontaneously. Gait is normal. CNII-XII grossly in tact. Psych:  Responds to questions appropriately with a normal affect.    ASSESSMENT AND PLAN:  25 y.o. year old female with  This chart was scribed in my presence and reviewed by me personally.    ICD-9-CM ICD-10-CM   1. PCOS (polycystic ovarian syndrome) 256.4 E28.2 spironolactone (ALDACTONE) 25 MG tablet  2. Acne, unspecified acne type 706.1 L70.9 spironolactone (ALDACTONE) 25 MG tablet  3. Affective disorder (HCC) 296.90 F39 amphetamine-dextroamphetamine (ADDERALL) 10 MG tablet   Signed, Elvina Sidle, MD 02/19/2016 4:36 PM

## 2016-02-25 ENCOUNTER — Ambulatory Visit: Payer: Self-pay | Admitting: Family Medicine

## 2016-03-16 ENCOUNTER — Telehealth: Payer: Self-pay

## 2016-03-16 NOTE — Telephone Encounter (Signed)
She will need to find another PCP.  I filled the Adderall as a courtesy last visit, but I am really not the right person to refill this.  I can refer her or she can "adopt" a different PCP and follow up with that person

## 2016-03-16 NOTE — Telephone Encounter (Signed)
Pt would like a refill on her amphetamine-dextroamphetamine (ADDERALL) 10 MG tablet [[id number]]. CB # [phone]

## 2016-03-18 NOTE — Telephone Encounter (Signed)
Please ask her to call [facility] or [facility]

## 2016-03-18 NOTE — Telephone Encounter (Signed)
Who would you refer her to? Psychiatry?

## 2016-03-18 NOTE — Telephone Encounter (Signed)
Patient stated she will like a referral for a PCP. I reviewed the message to the patient from Dr. Milus GlazierLauenstein.  Patient stated she didn't know that was the case.  [phone].

## 2016-03-19 NOTE — Telephone Encounter (Signed)
Advised pt, she will come in to establish care with another provider. She has had her psychological testing done.

## 2016-03-22 ENCOUNTER — Ambulatory Visit (INDEPENDENT_AMBULATORY_CARE_PROVIDER_SITE_OTHER): Payer: BLUE CROSS/BLUE SHIELD | Admitting: Family Medicine

## 2016-03-22 VITALS — BP 120/80 | HR 103 | Temp 98.0°F | Resp 20 | Wt 178.2 lb

## 2016-03-22 DIAGNOSIS — F39 Unspecified mood [affective] disorder: Secondary | ICD-10-CM

## 2016-03-22 DIAGNOSIS — E282 Polycystic ovarian syndrome: Secondary | ICD-10-CM

## 2016-03-22 DIAGNOSIS — L700 Acne vulgaris: Secondary | ICD-10-CM

## 2016-03-22 DIAGNOSIS — J301 Allergic rhinitis due to pollen: Secondary | ICD-10-CM | POA: Diagnosis not present

## 2016-03-22 MED ORDER — AMPHETAMINE-DEXTROAMPHET ER 10 MG PO CP24: 10.0000 mg | ORAL_CAPSULE | Freq: Two times a day (BID) | ORAL | Status: DC

## 2016-03-22 MED ORDER — CLINDAMYCIN PHOSPHATE 1 % EX GEL
Freq: Two times a day (BID) | CUTANEOUS | Status: DC
Start: 2016-03-22 — End: 2016-10-20

## 2016-03-22 NOTE — Patient Instructions (Addendum)
     IF you received an x-ray today, you will receive an invoice from [facility]. Please contact [facility] at [phone] with questions or concerns regarding your invoice.   IF you received labwork today, you will receive an invoice from [facility]. Please contact [facility] at [phone] with questions or concerns regarding your invoice.   Our billing staff will not be able to assist you with questions regarding bills from these companies.  You will be contacted with the lab results as soon as they are available. The fastest way to get your results is to activate your My Chart account. Instructions are located on the last page of this paperwork. If you have not heard from us regarding the results in 2 weeks, please contact this office.     [facility] Policy for Prescribing Controlled Substances (Revised 10/2012) 1. Prescriptions for controlled substances will be filled by ONE provider at [facility] with whom you have established and developed a plan for your care, including follow-up. 2. You are encouraged to schedule an appointment with your prescriber at our appointment center for follow-up visits whenever possible. 3. If you request a prescription for the controlled substance while at [facility] for an acute problem (with someone other than your regular prescriber), you MAY be given a ONE-TIME prescription for a 30-day supply of the controlled substance, to allow time for you to return to see your regular prescriber for additional prescriptions. 4.  

## 2016-03-22 NOTE — Progress Notes (Signed)
Subjective:    Patient ID: Wanda Torres, female    DOB: 16-Apr-1991, 25 y.o.   MRN: [id number]  03/22/2016  Medication Refill   HPI This 25 y.o. female presents for follow-up:   1.  ADD: ran out of Adderall for days.  Unaware that if transferred office, will need to establish with new provider.  Spoke with medical records staff; previous office had copy of records from psychologist.  Copy is in storage.  Saw Dr. Jenean Lindau in [place] for diagnosis; recent dx 1.5 years ago.  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  Husband was very disappointed; very stressed out; saw psychologist; sensory discourse processing disorder; Adderall is very beneficial.  No side effects other than ten pound wegiht loss.  Started eating six times per day.  Same dose for the entire time; had  ER bid.  Started taking both in the morning.  On days off, does not need more than  daily.  The fatigue if does not take it is very intense.    2. Acne: wants to discuss options.  Got a sample of Aczone gel 7.5%.  Taking Spironolactone  1/2 daily. Known PCOS so no contraception because causes nausea.  Cannot tolerate nausea from OCPs.  Still desires children.  Doxycycline causes nausea.  Septra in the past and caused nausea and diarrhea.  Face wash charcoal yes.   3. PCOS: testosterone was really high; no one managing PCOS now.  Insulin resistance.  Previous Metformin.  Has been taking Spironolactone two months ago.   Regular menses; goes 42 days every other month; 27-36 days every other month.  Not actively trying to get pregnant.  Daughter five weeks early; consider progesterone cream.  Children (1).     Last physical: Pap smear:  Midwife in [place]; 2015; previous [place] Influenza: Eye exam: Dental exam:  Review of Systems  Constitutional: Negative for fever, chills, diaphoresis and fatigue.  Eyes: Negative for visual disturbance.  Respiratory: Negative for cough and  shortness of breath.   Cardiovascular: Negative for chest pain, palpitations and leg swelling.  Gastrointestinal: Negative for nausea, vomiting, abdominal pain, diarrhea and constipation.  Endocrine: Negative for cold intolerance, heat intolerance, polydipsia, polyphagia and polyuria.  Neurological: Negative for dizziness, tremors, seizures, syncope, facial asymmetry, speech difficulty, weakness, light-headedness, numbness and headaches.    Past Medical History  Diagnosis Date  . Allergy   . Anemia   . Anxiety   . Thyroid disease    No past surgical history on file. No Known Allergies  Social History   Social History  . Marital Status: Single    Spouse Name: N/A  . Number of Children: N/A  . Years of Education: N/A   Occupational History  . Not on file.   Social History Main Topics  . Smoking status: Never Smoker   . Smokeless tobacco: Not on file  . Alcohol Use: Not on file  . Drug Use: Not on file  . Sexual Activity: Not on file   Other Topics Concern  . Not on file   Social History Narrative   No family history on file.     Objective:    BP 120/80 mmHg  Pulse 103  Temp(Src) 98 F (36.7 C) (Oral)  Resp 20  Wt 178 lb 3.2 oz (80.831 kg)  SpO2 98%  LMP 03/22/2016 Physical Exam  Constitutional: She is oriented to person, place, and time. She appears well-developed and well-nourished. No distress.  HENT:  Head: Normocephalic and atraumatic.  Right Ear: External ear normal.  Left Ear: External ear normal.  Nose: Nose normal.  Mouth/Throat: Oropharynx is clear and moist.  Eyes: Conjunctivae and EOM are normal. Pupils are equal, round, and reactive to light.  Neck: Normal range of motion. Neck supple. Carotid bruit is not present. No thyromegaly present.  Cardiovascular: Normal rate, regular rhythm, normal heart sounds and intact distal pulses.  Exam reveals no gallop and no friction rub.   No murmur heard. Pulmonary/Chest: Effort normal and breath sounds  normal. She has no wheezes. She has no rales.  Abdominal: Soft. Bowel sounds are normal. She exhibits no distension and no mass. There is no tenderness. There is no rebound and no guarding.  Lymphadenopathy:    She has no cervical adenopathy.  Neurological: She is alert and oriented to person, place, and time. No cranial nerve deficit.  Skin: Skin is warm and dry. No rash noted. She is not diaphoretic. No erythema. No pallor.  Cystic acne scattered along face and anterior chest.  Psychiatric: She has a normal mood and affect. Her behavior is normal.   Results for orders placed or performed in visit on 01/09/16  Sedimentation Rate  Result Value Ref Range   Sed Rate 3 0 - 20 mm/hr  D-dimer, quantitative (not at [facility])  Result Value Ref Range   D-Dimer, Quant 0.26 0.00 - 0.48 ug/mL-FEU  POCT CBC  Result Value Ref Range   WBC 5.6 4.6 - 10.2 K/uL   Lymph, poc 1.5 0.6 - 3.4   POC LYMPH PERCENT 26.1 10 - 50 %L   MID (cbc) 0.3 0 - 0.9   POC MID % 5.0 0 - 12 %M   POC Granulocyte 3.9 2 - 6.9   Granulocyte percent 68.9 37 - 80 %G   RBC 4.65 4.04 - 5.48 M/uL   Hemoglobin 15.8 12.2 - 16.2 g/dL   HCT, POC 16.1 09.6 - 47.9 %   MCV 98.8 (A) 80 - 97 fL   MCH, POC 34.0 (A) 27 - 31.2 pg   MCHC 34.4 31.8 - 35.4 g/dL   RDW, POC 04.5 %   Platelet Count, POC 185 142 - 424 K/uL   MPV 8.7 0 - 99.8 fL       Assessment & Plan:   1. Affective disorder (HCC)   2. Acne vulgaris   3. PCOS (polycystic ovarian syndrome)   4. Seasonal allergic rhinitis due to pollen     No orders of the defined types were placed in this encounter.   Meds ordered this encounter  Medications  . fluticasone (FLONASE) 50 MCG/ACT nasal spray    Sig: Place into both nostrils daily.  Marland Kitchen DISCONTD: amphetamine-dextroamphetamine (ADDERALL XR) 10 MG 24 hr capsule    Sig: Take 1 capsule (10 mg total) by mouth 2 (two) times daily.    Dispense:  60 capsule    Refill:  0  . DISCONTD: amphetamine-dextroamphetamine (ADDERALL XR)  10 MG 24 hr capsule    Sig: Take 1 capsule (10 mg total) by mouth 2 (two) times daily.    Dispense:  60 capsule    Refill:  0    DO NOT FILL UNTIL 04-22-2016  . amphetamine-dextroamphetamine (ADDERALL XR) 10 MG 24 hr capsule    Sig: Take 1 capsule (10 mg total) by mouth 2 (two) times daily.    Dispense:  60 capsule    Refill:  0    DO NOT FILL UNTIL 05-22-2016  .  clindamycin (CLINDAGEL) 1 % gel    Sig: Apply topically 2 (two) times daily.    Dispense:  30 g    Refill:  2    Return in about 6 months (around 09/22/2016) for recheck.    Clarine Elrod Paulita FujitaMartin Sokhna Christoph, M.D. [facility]   [address] [place], [place]  [address] [phone] phone [phone] fax

## 2016-03-26 DIAGNOSIS — E282 Polycystic ovarian syndrome: Secondary | ICD-10-CM | POA: Insufficient documentation

## 2016-06-21 ENCOUNTER — Other Ambulatory Visit: Payer: Self-pay

## 2016-06-21 NOTE — Telephone Encounter (Signed)
Patient is calling to request a refill for adderral [phone]

## 2016-06-24 MED ORDER — AMPHETAMINE-DEXTROAMPHET ER 10 MG PO CP24: 10.0000 mg | ORAL_CAPSULE | Freq: Two times a day (BID) | ORAL | Status: DC

## 2016-06-24 NOTE — Telephone Encounter (Signed)
Last Rx due to be filled 5/27.

## 2016-06-24 NOTE — Telephone Encounter (Signed)
Please call -- Adderall is ready for pick up.

## 2016-06-28 NOTE — Telephone Encounter (Signed)
I do not see this Rx in drawer or log book. I called pt and LMOM for her to call back if she still needs this script.

## 2016-06-28 NOTE — Telephone Encounter (Signed)
Pt called back and advised that she has not picked up this Rx, but that she would like to today if possible.

## 2016-07-07 ENCOUNTER — Ambulatory Visit (INDEPENDENT_AMBULATORY_CARE_PROVIDER_SITE_OTHER): Payer: BLUE CROSS/BLUE SHIELD | Admitting: Family Medicine

## 2016-07-07 ENCOUNTER — Encounter: Payer: Self-pay | Admitting: Family Medicine

## 2016-07-07 VITALS — BP 120/66 | HR 89 | Temp 98.4°F | Resp 16 | Ht 71.75 in | Wt 182.2 lb

## 2016-07-07 DIAGNOSIS — L409 Psoriasis, unspecified: Secondary | ICD-10-CM | POA: Diagnosis not present

## 2016-07-07 MED ORDER — DESONIDE 0.05 % EX CREA: TOPICAL_CREAM | Freq: Two times a day (BID) | CUTANEOUS | Status: DC

## 2016-07-07 MED ORDER — CLOBETASOL PROPIONATE 0.05 % EX SHAM: 1.0000 "application " | MEDICATED_SHAMPOO | Freq: Every day | CUTANEOUS | Status: DC

## 2016-07-07 MED ORDER — KETOCONAZOLE 2 % EX SHAM: 1.0000 "application " | MEDICATED_SHAMPOO | CUTANEOUS | Status: DC

## 2016-07-07 NOTE — Patient Instructions (Addendum)
     IF you received an x-ray today, you will receive an invoice from [facility]. Please contact [facility] at [phone] with questions or concerns regarding your invoice.   IF you received labwork today, you will receive an invoice from [facility]. Please contact [facility] at [phone] with questions or concerns regarding your invoice.   Our billing staff will not be able to assist you with questions regarding bills from these companies.  You will be contacted with the lab results as soon as they are available. The fastest way to get your results is to activate your My Chart account. Instructions are located on the last page of this paperwork. If you have not heard from us regarding the results in 2 weeks, please contact this office.    Seborrheic Dermatitis Seborrheic dermatitis involves pink or red skin with greasy, flaky scales. It usually occurs on the scalp, and it is often called dandruff. This condition may also affect the eyebrows, nose, ears, chest, and the bearded area of men's faces. It often occurs where skin has more oil (sebaceous) glands. It may come and go for no known reason, and it is often long-lasting (chronic). CAUSES The cause is not known. RISK FACTORS This condition is more like to develop in:  People who are stressed or tired.  People who have skin conditions, such as acne.  People who have certain conditions, such as:  HIV (human immunodeficiency virus).  AIDS (acquired immunodeficiency syndrome).  Parkinson disease.  An eating disorder.  Stroke.  Depression.  Epilepsy.  Alcoholism.  People who live in places that have extreme weather.  People who have a family history of seborrheic dermatitis.  People who use skin creams that are made with alcohol.  People who are 30-60 years old.  People who take certain medicines. SYMPTOMS Symptoms of this condition include:  Thick scales on the  scalp.  Redness on the face or in the armpits.  Skin that is flaky. The flakes may be white or yellow.  Skin that seems oily or dry but is not helped with moisturizers.  Itching or burning in the affected areas. DIAGNOSIS This condition is diagnosed with a medical history and physical exam. A sample of your skin may be tested (skin biopsy). You may need to see a skin specialist (dermatologist). TREATMENT There is no cure for this condition, but treatment can help to manage the symptoms. Treatment may include:  Cortisone (steroid) ointments, creams, and lotions.  Over-the-counter or prescription shampoos. HOME CARE INSTRUCTIONS  Apply over-the-counter and prescription medicines only as told by your health care provider.  Keep all follow-up visits as told by your health care provider. This is important.  Try to reduce your stress, such as with yoga or mediation. If you need help to reduce stress, ask your health care provider.  Shower or bathe as told by your health care provider.  Use any medicated shampoos as told by your health care provider. SEEK MEDICAL CARE IF:  Your symptoms do not improve with treatment.  Your symptoms get worse.  You have new symptoms.   This information is not intended to replace advice given to you by your health care provider. Make sure you discuss any questions you have with your health care provider.   Document Released: 12/13/2005 Document Revised: 09/03/2015 Document Reviewed: 04/30/2015 Elsevier Interactive Patient Education 2016 Elsevier Inc.  

## 2016-07-07 NOTE — Progress Notes (Signed)
Subjective:    Patient ID: Wanda Torres, female    DOB: 01-27-1991, 25 y.o.   MRN: [id number]  07/07/2016  Psoriasis (on body - head and fingers x 1 week)   HPI This 25 y.o. female presents for evaluation of worsening psoriasis.  Diagnosed years ago by dermatology. Recent worsening especially around hairline along forehead and posterior scalp.  Not sure of trigger.   Acne vulgaris: spironolactone daily.   Review of Systems  Constitutional: Negative for chills, diaphoresis, fatigue and fever.  Musculoskeletal: Negative for arthralgias and joint swelling.  Skin: Positive for color change and rash. Negative for pallor.    Past Medical History:  Diagnosis Date  . Allergy   . Anemia   . Anxiety   . Thyroid disease    History reviewed. No pertinent surgical history. No Known Allergies  Social History   Social History  . Marital status: Single    Spouse name: N/A  . Number of children: N/A  . Years of education: N/A   Occupational History  .      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 Smoking status: Never Smoker  . Smokeless tobacco: Not on file  . Alcohol use Not on file  . Drug use: Unknown  . Sexual activity: Not on file   Other Topics Concern  . Not on file   Social History Narrative  . No narrative on file   History reviewed. No pertinent family history.     Objective:    BP 120/66   Pulse 89   Temp 98.4 F (36.9 C) (Oral)   Resp 16   Ht 5' 11.75" (1.822 m)   Wt 182 lb 3.2 oz (82.6 kg)   LMP 07/06/2016   SpO2 100%   BMI 24.88 kg/m  Physical Exam  Constitutional: She is oriented to person, place, and time. She appears well-developed and well-nourished. No distress.  HENT:  Head: Normocephalic and atraumatic.  Eyes: Conjunctivae are normal. Pupils are equal, round, and reactive to light.  Neck: Normal range of motion. Neck supple.  Cardiovascular: Normal rate, regular rhythm and normal  heart sounds.  Exam reveals no gallop and no friction rub.   No murmur heard. Pulmonary/Chest: Effort normal and breath sounds normal. She has no wheezes. She has no rales.  Neurological: She is alert and oriented to person, place, and time.  Skin: Rash noted. She is not diaphoretic.  Scaling erythematous rash along hairline diffusely along frontal region and posterior scalp.   Psychiatric: She has a normal mood and affect. Her behavior is normal.  Nursing note and vitals reviewed.  Results for orders placed or performed in visit on 01/09/16  Sedimentation Rate  Result Value Ref Range   Sed Rate 3 0 - 20 mm/hr  D-dimer, quantitative (not at [facility])  Result Value Ref Range   D-Dimer, Quant 0.26 0.00 - 0.48 ug/mL-FEU  POCT CBC  Result Value Ref Range   WBC 5.6 4.6 - 10.2 K/uL   Lymph, poc 1.5 0.6 - 3.4   POC LYMPH PERCENT 26.1 10 - 50 %L   MID (cbc) 0.3 0 - 0.9   POC MID % 5.0 0 - 12 %M   POC Granulocyte 3.9 2 - 6.9   Granulocyte percent 68.9 37 - 80 %G   RBC 4.65 4.04 - 5.48 M/uL   Hemoglobin 15.8 12.2 - 16.2 g/dL   HCT, POC 40.9 81.1 - 47.9 %   MCV  98.8 (A) 80 - 97 fL   MCH, POC 34.0 (A) 27 - 31.2 pg   MCHC 34.4 31.8 - 35.4 g/dL   RDW, POC 16.111.4 %   Platelet Count, POC 185 142 - 424 K/uL   MPV 8.7 0 - 99.8 fL       Assessment & Plan:   1. Psoriasis    -acutely worsening. -versus seborrhea dermatitis. -treatment is similar. -rx for Clobetasol shampoo, Ketoconazole shampoo, and Desowen. -if no improvement, refer to dermatology.   No orders of the defined types were placed in this encounter.  Meds ordered this encounter  Medications  . Clobetasol Propionate 0.05 % shampoo    Sig: Apply 1 application topically daily.    Dispense:  118 mL    Refill:  2  . ketoconazole (NIZORAL) 2 % shampoo    Sig: Apply 1 application topically 2 (two) times a week.    Dispense:  120 mL    Refill:  2  . desonide (DESOWEN) 0.05 % cream    Sig: Apply topically 2 (two) times daily.     Dispense:  30 g    Refill:  0    No Follow-up on file.    Wanda Torres Wanda Torres, M.D. [facility]  [place] [address] [place], [place]  [address] [phone] phone [phone] fax

## 2016-08-18 ENCOUNTER — Encounter: Payer: Self-pay | Admitting: Family Medicine

## 2016-09-23 ENCOUNTER — Ambulatory Visit (INDEPENDENT_AMBULATORY_CARE_PROVIDER_SITE_OTHER): Payer: BLUE CROSS/BLUE SHIELD | Admitting: Family Medicine

## 2016-09-23 VITALS — BP 130/74 | HR 109 | Temp 98.6°F | Resp 17 | Ht 71.75 in | Wt 180.0 lb

## 2016-09-23 DIAGNOSIS — F329 Major depressive disorder, single episode, unspecified: Secondary | ICD-10-CM | POA: Diagnosis not present

## 2016-09-23 DIAGNOSIS — F4321 Adjustment disorder with depressed mood: Secondary | ICD-10-CM

## 2016-09-23 DIAGNOSIS — F900 Attention-deficit hyperactivity disorder, predominantly inattentive type: Secondary | ICD-10-CM

## 2016-09-23 DIAGNOSIS — F419 Anxiety disorder, unspecified: Secondary | ICD-10-CM | POA: Diagnosis not present

## 2016-09-23 DIAGNOSIS — F4320 Adjustment disorder, unspecified: Secondary | ICD-10-CM | POA: Diagnosis not present

## 2016-09-23 MED ORDER — AMPHETAMINE-DEXTROAMPHET ER 10 MG PO CP24: 10.0000 mg | ORAL_CAPSULE | Freq: Two times a day (BID) | ORAL | 0 refills | Status: DC

## 2016-09-23 MED ORDER — SERTRALINE HCL 50 MG PO TABS: 50.0000 mg | ORAL_TABLET | Freq: Every day | ORAL | 5 refills | Status: DC

## 2016-09-23 MED ORDER — CLONAZEPAM 0.5 MG PO TABS: 0.2500 mg | ORAL_TABLET | Freq: Two times a day (BID) | ORAL | 0 refills | Status: DC | PRN

## 2016-09-23 NOTE — Progress Notes (Signed)
Subjective:    Patient ID: Wanda Torres, female    DOB: 03-Aug-1991, 25 y.o.   MRN: [id number]  09/23/2016  Depression (Onset August 23, 2016 since Aunt died.) and Medication Refill (Adderall)  HPI  HPI Comments: Wanda Torres is a 25 y.o. female who [facility] medication refill of Adderall and to discuss depression.  Aunt recently committed suicide and patient not coping well.  Having episodes of anger and anxiety; frequently will develop times of rage and does not recall the events of these episodes.  Denies SI.    Depression screen [facility] 2/9 09/23/2016 07/07/2016 03/22/2016 02/19/2016 01/26/2016  Decreased Interest 1 0 0 0 1  Down, Depressed, Hopeless 3 0 0 0 3  PHQ - 2 Score 4 0 0 0 4  Altered sleeping 3 - 0 - 3  Tired, decreased energy 3 - 0 - 0  Change in appetite 3 - 0 - 1  Feeling bad or failure about yourself  0 - 0 - 1  Trouble concentrating 3 - 0 - 2  Moving slowly or fidgety/restless 0 - 0 - 3  Suicidal thoughts 0 - 0 - 0  PHQ-9 Score 16 - 0 - 14  Difficult doing work/chores Very difficult - Not difficult at all - Very difficult   Review of Systems  Past Medical History:  Diagnosis Date  . Acne   . Allergy   . Anemia   . Anxiety   . Psoriasis   . Thyroid disease    No past surgical history on file. No Known Allergies  Social History   Social History  . Marital status: Single    Spouse name: N/A  . Number of children: N/A  . Years of education: N/A   Occupational History  .      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 Smoking status: Never Smoker  . Smokeless tobacco: Not on file  . Alcohol use Not on file  . Drug use: Unknown  . Sexual activity: Not on file   Other Topics Concern  . Not on file   Social History Narrative  . No narrative on file   No family history on file.     Objective:    BP 130/74 (BP Location: Right Arm, Patient Position: Sitting,  Cuff Size: Normal)   Pulse (!) 109   Temp 98.6 F (37 C) (Oral)   Resp 17   Ht 5' 11.75" (1.822 m)   Wt 180 lb (81.6 kg)   LMP 09/16/2016   SpO2 97%   BMI 24.58 kg/m  Physical Exam  Constitutional: She is oriented to person, place, and time. She appears well-developed and well-nourished. No distress.  HENT:  Head: Normocephalic and atraumatic.  Right Ear: External ear normal.  Left Ear: External ear normal.  Nose: Nose normal.  Mouth/Throat: Oropharynx is clear and moist.  Eyes: Conjunctivae and EOM are normal. Pupils are equal, round, and reactive to light.  Neck: Normal range of motion. Neck supple. Carotid bruit is not present. No thyromegaly present.  Cardiovascular: Normal rate, regular rhythm, normal heart sounds and intact distal pulses.  Exam reveals no gallop and no friction rub.   No murmur heard. Pulmonary/Chest: Effort normal and breath sounds normal. She has no wheezes. She has no rales.  Abdominal: Soft. Bowel sounds are normal. She exhibits no distension and no mass. There is no tenderness. There is no rebound and no  guarding.  Lymphadenopathy:    She has no cervical adenopathy.  Neurological: She is alert and oriented to person, place, and time. No cranial nerve deficit. She exhibits normal muscle tone. Coordination normal.  Skin: Skin is warm and dry. No rash noted. She is not diaphoretic. No erythema. No pallor.  Psychiatric: She has a normal mood and affect. Her behavior is normal. Judgment and thought content normal.    Assessment & Plan:   1. Anxiety and depression   2. Grief reaction   3. Attention deficit hyperactivity disorder (ADHD), predominantly inattentive type    -New; counseling provided; rx for Zoloft 50mg  daily and Klonopin 0.5mg  provided for PRN use. -highly recommend psychotherapy. -refill of Adderall XR 10mg  daily provided; call in three months for refills.   No orders of the defined types were placed in this encounter.  Meds ordered  this encounter  Medications  . sertraline (ZOLOFT) 50 MG tablet    Sig: Take 1 tablet (50 mg total) by mouth daily.    Dispense:  30 tablet    Refill:  5  . DISCONTD: amphetamine-dextroamphetamine (ADDERALL XR) 10 MG 24 hr capsule    Sig: Take 1 capsule (10 mg total) by mouth 2 (two) times daily.    Dispense:  60 capsule    Refill:  0    Do not fill until 09/24/2016  . DISCONTD: amphetamine-dextroamphetamine (ADDERALL XR) 10 MG 24 hr capsule    Sig: Take 1 capsule (10 mg total) by mouth 2 (two) times daily.    Dispense:  60 capsule    Refill:  0    Do not fill until 10/24/2016  . amphetamine-dextroamphetamine (ADDERALL XR) 10 MG 24 hr capsule    Sig: Take 1 capsule (10 mg total) by mouth 2 (two) times daily.    Dispense:  60 capsule    Refill:  0    Do not fill until 11/24/2016  . clonazePAM (KLONOPIN) 0.5 MG tablet    Sig: Take 0.5-1 tablets (0.25-0.5 mg total) by mouth 2 (two) times daily as needed for anxiety.    Dispense:  20 tablet    Refill:  0    Return in about 4 weeks (around 10/21/2016) for recheck.   Wanda Hanken Paulita FujitaMartin Zuriel Torres, M.D. [facility]  [place] [address] [place], [place]  [address] [phone] phone [phone] fax

## 2016-09-23 NOTE — Patient Instructions (Addendum)
   IF you received an x-ray today, you will receive an invoice from [facility]. Please contact [facility] at [phone] with questions or concerns regarding your invoice.   IF you received labwork today, you will receive an invoice from [facility]. Please contact [facility] at [phone] with questions or concerns regarding your invoice.   Our billing staff will not be able to assist you with questions regarding bills from these companies.  You will be contacted with the lab results as soon as they are available. The fastest way to get your results is to activate your My Chart account. Instructions are located on the last page of this paperwork. If you have not heard from us regarding the results in 2 weeks, please contact this office.     Generalized Anxiety Disorder Generalized anxiety disorder (GAD) is a mental disorder. It interferes with life functions, including relationships, work, and school. GAD is different from normal anxiety, which everyone experiences at some point in their lives in response to specific life events and activities. Normal anxiety actually helps us prepare for and get through these life events and activities. Normal anxiety goes away after the event or activity is over.  GAD causes anxiety that is not necessarily related to specific events or activities. It also causes excess anxiety in proportion to specific events or activities. The anxiety associated with GAD is also difficult to control. GAD can vary from mild to severe. People with severe GAD can have intense waves of anxiety with physical symptoms (panic attacks).  SYMPTOMS The anxiety and worry associated with GAD are difficult to control. This anxiety and worry are related to many life events and activities and also occur more days than not for 6 months or longer. People with GAD also have three or more of the following symptoms (one or more in  children):  Restlessness.   Fatigue.  Difficulty concentrating.   Irritability.  Muscle tension.  Difficulty sleeping or unsatisfying sleep. DIAGNOSIS GAD is diagnosed through an assessment by your health care provider. Your health care provider will ask you questions aboutyour mood,physical symptoms, and events in your life. Your health care provider may ask you about your medical history and use of alcohol or drugs, including prescription medicines. Your health care provider may also do a physical exam and blood tests. Certain medical conditions and the use of certain substances can cause symptoms similar to those associated with GAD. Your health care provider may refer you to a mental health specialist for further evaluation. TREATMENT The following therapies are usually used to treat GAD:   Medication. Antidepressant medication usually is prescribed for long-term daily control. Antianxiety medicines may be added in severe cases, especially when panic attacks occur.   Talk therapy (psychotherapy). Certain types of talk therapy can be helpful in treating GAD by providing support, education, and guidance. A form of talk therapy called cognitive behavioral therapy can teach you healthy ways to think about and react to daily life events and activities.  Stress managementtechniques. These include yoga, meditation, and exercise and can be very helpful when they are practiced regularly. A mental health specialist can help determine which treatment is best for you. Some people see improvement with one therapy. However, other people require a combination of therapies.   This information is not intended to replace advice given to you by your health care provider. Make sure you discuss any questions you have with your health care provider.   Document Released: 04/09/2013   Document Revised: 01/03/2015 Document Reviewed: 04/09/2013 Elsevier Interactive Patient Education 2016 Elsevier Inc.  

## 2016-10-17 DIAGNOSIS — F419 Anxiety disorder, unspecified: Principal | ICD-10-CM

## 2016-10-17 DIAGNOSIS — F329 Major depressive disorder, single episode, unspecified: Secondary | ICD-10-CM | POA: Insufficient documentation

## 2016-10-17 DIAGNOSIS — F900 Attention-deficit hyperactivity disorder, predominantly inattentive type: Secondary | ICD-10-CM | POA: Insufficient documentation

## 2016-10-17 DIAGNOSIS — F32A Depression, unspecified: Secondary | ICD-10-CM | POA: Insufficient documentation

## 2016-10-18 ENCOUNTER — Ambulatory Visit: Payer: BLUE CROSS/BLUE SHIELD

## 2016-10-20 ENCOUNTER — Ambulatory Visit (INDEPENDENT_AMBULATORY_CARE_PROVIDER_SITE_OTHER): Payer: BLUE CROSS/BLUE SHIELD | Admitting: Family Medicine

## 2016-10-20 VITALS — BP 112/76 | HR 120 | Temp 98.7°F | Resp 17 | Ht 71.75 in | Wt 177.0 lb

## 2016-10-20 DIAGNOSIS — E282 Polycystic ovarian syndrome: Secondary | ICD-10-CM | POA: Diagnosis not present

## 2016-10-20 DIAGNOSIS — F900 Attention-deficit hyperactivity disorder, predominantly inattentive type: Secondary | ICD-10-CM | POA: Diagnosis not present

## 2016-10-20 DIAGNOSIS — F419 Anxiety disorder, unspecified: Principal | ICD-10-CM

## 2016-10-20 DIAGNOSIS — F418 Other specified anxiety disorders: Secondary | ICD-10-CM

## 2016-10-20 DIAGNOSIS — F32A Depression, unspecified: Secondary | ICD-10-CM

## 2016-10-20 DIAGNOSIS — F329 Major depressive disorder, single episode, unspecified: Secondary | ICD-10-CM

## 2016-10-20 DIAGNOSIS — L700 Acne vulgaris: Secondary | ICD-10-CM

## 2016-10-20 MED ORDER — MINOCYCLINE HCL 50 MG PO CAPS: 50.0000 mg | ORAL_CAPSULE | Freq: Three times a day (TID) | ORAL | 2 refills | Status: DC

## 2016-10-20 MED ORDER — NORETHIN ACE-ETH ESTRAD-FE 1-20 MG-MCG PO TABS: 1.0000 | ORAL_TABLET | Freq: Every day | ORAL | 11 refills | Status: DC

## 2016-10-20 MED ORDER — ADAPALENE 0.1 % EX GEL: Freq: Every day | CUTANEOUS | 0 refills | Status: DC

## 2016-10-20 NOTE — Patient Instructions (Addendum)
  Call dermatology for an appointment. Call for gynecology appointment for IUD.   IF you received an x-ray today, you will receive an invoice from [facility]. Please contact [facility] at [phone] with questions or concerns regarding your invoice.   IF you received labwork today, you will receive an invoice from [facility]. Please contact [facility] at [phone] with questions or concerns regarding your invoice.   Our billing staff will not be able to assist you with questions regarding bills from these companies.  You will be contacted with the lab results as soon as they are available. The fastest way to get your results is to activate your My Chart account. Instructions are located on the last page of this paperwork. If you have not heard from us regarding the results in 2 weeks, please contact this office.
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
## 2017-01-26 ENCOUNTER — Ambulatory Visit: Payer: BLUE CROSS/BLUE SHIELD | Admitting: Family Medicine

## 2017-04-13 ENCOUNTER — Encounter: Payer: Self-pay | Admitting: Family Medicine

## 2017-04-14 ENCOUNTER — Ambulatory Visit (INDEPENDENT_AMBULATORY_CARE_PROVIDER_SITE_OTHER): Payer: BLUE CROSS/BLUE SHIELD | Admitting: Family Medicine

## 2017-04-14 VITALS — BP 110/70 | HR 102 | Temp 98.1°F | Resp 18 | Ht 71.75 in | Wt 207.0 lb

## 2017-04-14 DIAGNOSIS — F329 Major depressive disorder, single episode, unspecified: Secondary | ICD-10-CM | POA: Diagnosis not present

## 2017-04-14 DIAGNOSIS — F900 Attention-deficit hyperactivity disorder, predominantly inattentive type: Secondary | ICD-10-CM | POA: Diagnosis not present

## 2017-04-14 DIAGNOSIS — E282 Polycystic ovarian syndrome: Secondary | ICD-10-CM

## 2017-04-14 DIAGNOSIS — F419 Anxiety disorder, unspecified: Secondary | ICD-10-CM

## 2017-04-14 DIAGNOSIS — L700 Acne vulgaris: Secondary | ICD-10-CM

## 2017-04-14 DIAGNOSIS — F32A Depression, unspecified: Secondary | ICD-10-CM

## 2017-04-14 MED ORDER — SERTRALINE HCL 50 MG PO TABS: 50.0000 mg | ORAL_TABLET | Freq: Every day | ORAL | 5 refills | Status: DC

## 2017-04-14 MED ORDER — AMPHETAMINE-DEXTROAMPHET ER 10 MG PO CP24: 10.0000 mg | ORAL_CAPSULE | Freq: Two times a day (BID) | ORAL | 0 refills | Status: DC

## 2017-04-14 NOTE — Patient Instructions (Signed)
     IF you received an x-ray today, you will receive an invoice from [facility]. Please contact [facility] at [phone] with questions or concerns regarding your invoice.   IF you received labwork today, you will receive an invoice from [facility]. Please contact [facility] at [phone] with questions or concerns regarding your invoice.   Our billing staff will not be able to assist you with questions regarding bills from these companies.  You will be contacted with the lab results as soon as they are available. The fastest way to get your results is to activate your My Chart account. Instructions are located on the last page of this paperwork. If you have not heard from us regarding the results in 2 weeks, please contact this office.     

## 2017-04-14 NOTE — Progress Notes (Signed)
110/70

## 2017-04-14 NOTE — Progress Notes (Signed)
Subjective:    Patient ID: Wanda Torres, female    DOB: 21-Jul-1991, 26 y.o.   MRN: [id number]  04/14/2017  Medication Refill (Refill on Zoloft and would like to discuss other meds )   HPI This 26 y.o. female presents for six month follow-up of anxiety/depression.  After last visit, realized that menses late; menses have always been irregular.  After Thanksgiving, had been six weeks since menses.  Took five pregnancy tests; all five positive.  Six months pregnant.  [facility].  Now on progesterone injections weekly.  Nauseated, tired.  Daughter was 35 weeks.  Quit smoking.  Stopped all medications; still on Sertraline  daily.  Anatomy ultrasound normal.  Took Sertraline  daily first pregnancy.  Stopped with all shampoos also.  Has not taken Adderall for six months.  Wanted to discuss options; has had three discussions with supervisor during pregnancy due to decline in work performance off of Adderall.   [redacted] weeks pregnant.    Review of Systems  Constitutional: Negative for chills, diaphoresis, fatigue and fever.  Eyes: Negative for visual disturbance.  Respiratory: Negative for cough and shortness of breath.   Cardiovascular: Negative for chest pain, palpitations and leg swelling.  Gastrointestinal: Negative for abdominal pain, constipation, diarrhea, nausea and vomiting.  Endocrine: Negative for cold intolerance, heat intolerance, polydipsia, polyphagia and polyuria.  Neurological: Negative for dizziness, tremors, seizures, syncope, facial asymmetry, speech difficulty, weakness, light-headedness, numbness and headaches.  Psychiatric/Behavioral: Positive for decreased concentration. Negative for dysphoric mood, self-injury, sleep disturbance and suicidal ideas. The patient is nervous/anxious.     Past Medical History:  Diagnosis Date  . Acne   . Allergy   . Anemia   . Anxiety   . Psoriasis   . Thyroid disease    History reviewed. No pertinent  surgical history. No Known Allergies  Social History   Social History  . Marital status: Single    Spouse name: N/A  . Number of children: N/A  . Years of education: N/A   Occupational History  .      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 Smoking status: Never Smoker  . Smokeless tobacco: Never Used  . Alcohol use Not on file  . Drug use: Unknown  . Sexual activity: Not on file   Other Topics Concern  . Not on file   Social History Narrative  . No narrative on file   History reviewed. No pertinent family history.     Objective:    BP 110/70   Pulse (!) 102   Temp 98.1 F (36.7 C) (Oral)   Resp 18   Ht 5' 11.75" (1.822 m)   Wt 207 lb (93.9 kg)   LMP 10/18/2016   SpO2 96%   BMI 28.27 kg/m  Physical Exam  Constitutional: She is oriented to person, place, and time. She appears well-developed and well-nourished. No distress.  HENT:  Head: Normocephalic and atraumatic.  Right Ear: External ear normal.  Left Ear: External ear normal.  Nose: Nose normal.  Mouth/Throat: Oropharynx is clear and moist.  Eyes: Conjunctivae and EOM are normal. Pupils are equal, round, and reactive to light.  Neck: Normal range of motion. Neck supple. Carotid bruit is not present. No thyromegaly present.  Cardiovascular: Normal rate, regular rhythm, normal heart sounds and intact distal pulses.  Exam reveals no gallop and no friction rub.   No murmur heard. Pulmonary/Chest: Effort normal and breath sounds  normal. She has no wheezes. She has no rales.  Abdominal:  Gravid abdomen.  Lymphadenopathy:    She has no cervical adenopathy.  Neurological: She is alert and oriented to person, place, and time. No cranial nerve deficit.  Skin: Skin is warm and dry. No rash noted. She is not diaphoretic. No erythema. No pallor.  Psychiatric: She has a normal mood and affect. Her behavior is normal. Judgment and thought content normal.   Depression  screen [facility] 2/9 04/14/2017 10/20/2016 09/23/2016 07/07/2016 03/22/2016  Decreased Interest 0 0 1 0 0  Down, Depressed, Hopeless 0 0 3 0 0  PHQ - 2 Score 0 0 4 0 0  Altered sleeping - - 3 - 0  Tired, decreased energy - - 3 - 0  Change in appetite - - 3 - 0  Feeling bad or failure about yourself  - - 0 - 0  Trouble concentrating - - 3 - 0  Moving slowly or fidgety/restless - - 0 - 0  Suicidal thoughts - - 0 - 0  PHQ-9 Score - - 16 - 0  Difficult doing work/chores - - Very difficult - Not difficult at all        Assessment & Plan:   1. Anxiety and depression   2. Attention deficit hyperactivity disorder (ADHD), predominantly inattentive type   3. PCOS (polycystic ovarian syndrome)   4. Acne vulgaris    -discussed risk and benefits of stimulants during pregnancy.  Refill of Adderall XR  daily provided.Pregnancy class C. -discussed risk and benefits of Zoloft use during pregnancy; refill of Zoloft provided.  Use of SSRIs have been associated risk of PP hemorrhage.  Pregnancy Class C. -recommend regular psychotherapy as warranted. -discussed increased risk of PP depression; thus, close follow-up warranted after delivery.  No orders of the defined types were placed in this encounter.  Meds ordered this encounter  Medications  . hydroxyprogesterone caproate (MAKENA) 250 mg/mL OIL injection    Sig: Inject 250 mg into the muscle once. Once weekly  . sertraline (ZOLOFT) 50 MG tablet    Sig: Take 1 tablet (50 mg total) by mouth daily.    Dispense:  30 tablet    Refill:  5  . amphetamine-dextroamphetamine (ADDERALL XR) 10 MG 24 hr capsule    Sig: Take 1 capsule (10 mg total) by mouth 2 (two) times daily.    Dispense:  60 capsule    Refill:  0  . DISCONTD: amphetamine-dextroamphetamine (ADDERALL XR) 10 MG 24 hr capsule    Sig: Take 1 capsule (10 mg total) by mouth 2 (two) times daily.    Dispense:  60 capsule    Refill:  0    Do not fill for 30 days after prescribed  .  amphetamine-dextroamphetamine (ADDERALL XR) 10 MG 24 hr capsule    Sig: Take 1 capsule (10 mg total) by mouth 2 (two) times daily.    Dispense:  60 capsule    Refill:  0    Do not fill for 60 days after prescribed    No Follow-up on file.   Kam Rahimi Paulita Fujita, M.D. Primary Care at [facility] previously [facility] [address] [place], [place]  [address] [phone] phone [phone] fax

## 2017-04-17 ENCOUNTER — Other Ambulatory Visit: Payer: Self-pay | Admitting: Family Medicine

## 2017-06-27 ENCOUNTER — Encounter: Payer: Self-pay | Admitting: Family Medicine

## 2017-06-27 ENCOUNTER — Ambulatory Visit (INDEPENDENT_AMBULATORY_CARE_PROVIDER_SITE_OTHER): Payer: BLUE CROSS/BLUE SHIELD | Admitting: Family Medicine

## 2017-06-27 VITALS — BP 119/74 | HR 95 | Temp 98.0°F | Resp 18 | Ht 72.05 in | Wt 220.0 lb

## 2017-06-27 DIAGNOSIS — Z3A32 32 weeks gestation of pregnancy: Secondary | ICD-10-CM

## 2017-06-27 DIAGNOSIS — G5621 Lesion of ulnar nerve, right upper limb: Secondary | ICD-10-CM

## 2017-06-27 NOTE — Progress Notes (Signed)
Subjective:    Patient ID: Wanda Torres, female    DOB: 06-Dec-1991, 26 y.o.   MRN: [id number]  06/27/2017  Carpal Tunnel (x 1 month right hand )   HPI This 26 y.o. female presents for evaluation of R carpal tunnel syndrome. Onset one month ago.  Wrote out of work.  Wrote out of work 05/25/17.   Has not been to work in past one month.  Still having symptoms.  Wearing brace also in afternoon and at night.No B6 or B complex to take.  Taking PNV.  Fourth and fifth digits are numb and radiates into forearm.  When making fist, pain radiates into forearm.  Boyfriend is Public librarianJoey and starting medical school; bought a house.  Taking Tylenol.  [facility] [place].  Goes to see them next week.    For work, types all day.    Not taking Adderall since out of work for past month.  Midwife will not fill out paperwork for short-term disability so patient is presenting to PCP for short term disability form completion.  Midwife wrote patient out of work for four weeks.     Review of Systems  Constitutional: Negative for chills, diaphoresis, fatigue and fever.  Eyes: Negative for visual disturbance.  Respiratory: Negative for cough and shortness of breath.   Cardiovascular: Negative for chest pain, palpitations and leg swelling.  Gastrointestinal: Negative for abdominal pain, constipation, diarrhea, nausea and vomiting.  Endocrine: Negative for cold intolerance, heat intolerance, polydipsia, polyphagia and polyuria.  Musculoskeletal: Positive for arthralgias.  Neurological: Positive for weakness and numbness. Negative for dizziness, tremors, seizures, syncope, facial asymmetry, speech difficulty, light-headedness and headaches.    Past Medical History:  Diagnosis Date  . Acne   . Allergy   . Anemia   . Anxiety   . Psoriasis   . Thyroid disease    History reviewed. No pertinent surgical history. No Known Allergies Current Outpatient Prescriptions  Medication Sig  Dispense Refill  . amphetamine-dextroamphetamine (ADDERALL XR) 10 MG 24 hr capsule Take 1 capsule (10 mg total) by mouth 2 (two) times daily. 60 capsule 0  . hydroxyprogesterone caproate (MAKENA) 250 mg/mL OIL injection Inject 250 mg into the muscle once. Once weekly    . sertraline (ZOLOFT) 50 MG tablet Take 1 tablet (50 mg total) by mouth daily. 30 tablet 5   No current facility-administered medications for this visit.    Social History   Social History  . Marital status: Single    Spouse name: N/A  . Number of children: N/A  . Years of education: N/A   Occupational History  .      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 Smoking status: Never Smoker  . Smokeless tobacco: Never Used  . Alcohol use Not on file  . Drug use: Unknown  . Sexual activity: Not on file   Other Topics Concern  . Not on file   Social History Narrative  . No narrative on file   History reviewed. No pertinent family history.     Objective:    BP 119/74   Pulse 95   Temp 98 F (36.7 C) (Oral)   Resp 18   Ht 6' 0.05" (1.83 m)   Wt 220 lb (99.8 kg)   LMP 10/18/2016   SpO2 97%   BMI 29.80 kg/m  Physical Exam  Constitutional: She is oriented to person, place, and time. She appears  well-developed and well-nourished. No distress.  HENT:  Head: Normocephalic and atraumatic.  Eyes: Conjunctivae and EOM are normal. Pupils are equal, round, and reactive to light.  Neck: Normal range of motion. Neck supple. Carotid bruit is not present.  Cardiovascular: Normal rate, regular rhythm, normal heart sounds and intact distal pulses.  Exam reveals no gallop and no friction rub.   No murmur heard. Pulmonary/Chest: Effort normal and breath sounds normal. She has no wheezes. She has no rales.  Abdominal:  gravid  Musculoskeletal:       Right shoulder: Normal.       Right elbow: Normal.      Right wrist: Normal.       Cervical back: Normal. She exhibits  normal range of motion, no tenderness, no bony tenderness, no pain, no spasm and normal pulse.       Right hand: Normal.  Neurological: She is alert and oriented to person, place, and time. No cranial nerve deficit.  Skin: Skin is warm and dry. No rash noted. She is not diaphoretic. No erythema. No pallor.  Psychiatric: She has a normal mood and affect. Her behavior is normal.        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 [redacted] weeks gestation of pregnancy    -New onset RIGHT ulnar neuropathy; continue wrist splint during the day. -recommend starting B complex vitamin. -clearance to return to work; recommend submitting paperwork to midwife who wrote pt out of work.  I am not comfortable completing disability paperwork as I would not have written patient out of work for ulnar neuropathy.  -refer to ortho for further management   Orders Placed This Encounter  Procedures  . AMB referral to orthopedics    Referral Priority:   Routine    Referral Type:   Consultation    Number of Visits Requested:   1   No orders of the defined types were placed in this encounter.   No Follow-up on file.   Ranyah Groeneveld Paulita Fujita, M.D. Primary Care at [facility] previously [facility] [address] [place], [place]  [address] [phone] phone [phone] fax

## 2017-06-27 NOTE — Patient Instructions (Addendum)
Recommend starting B complex daily. Wear brace at night and only with excessive use of wrist during the day.   IF you received an x-ray today, you will receive an invoice from [facility]. Please contact [facility] at [phone] with questions or concerns regarding your invoice.   IF you received labwork today, you will receive an invoice from [facility]. Please contact [facility] at [phone] with questions or concerns regarding your invoice.   Our billing staff will not be able to assist you with questions regarding bills from these companies.  You will be contacted with the lab results as soon as they are available. The fastest way to get your results is to activate your My Chart account. Instructions are located on the last page of this paperwork. If you have not heard from us regarding the results in 2 weeks, please contact this office.      Ulnar Nerve Contusion Rehab Ask your health care provider which exercises are safe for you. Do exercises exactly as told by your health care provider and adjust them as directed. It is normal to feel mild stretching, pulling, tightness, or discomfort as you do these exercises, but you should stop right away if you feel sudden pain or your pain gets worse. Do not begin these exercises until told by your health care provider. Stretching and range of motion exercises These exercises warm up your muscles and joints and improve the movement and flexibility of your forearm. These exercises also help to relieve pain, numbness, and tingling. Exercise A: Extensor stretch 1. Stand over a tabletop with your __________ hand resting palm-up on the tabletop and your fingers pointing away from your body. Your arm should be extended and there should be a slight bend in your elbow. 2. Gently press the back of your hand down onto the table by straightening your elbow. You should feel a stretch on the top of your forearm. 3. Hold this position for  __________ seconds. 4. Slowly return to the starting position. Repeat __________ times. Complete this stretch __________ times a day. Exercise B: Flexor stretch  1. Stand over a tabletop with your __________ hand resting palm-down on the tabletop and your fingers pointing away from your body. Your arm should be extended and there should be a slight bend in your elbow. 2. Gently press your fingers and palm down onto the table by straightening your elbow. You should feel a stretch on the inside of your forearm. 3. Hold this position for __________ seconds. 4. Slowly return to the starting position. Repeat __________ times. Complete this stretch __________ times a day. Exercise C: Ulnar nerve glide, hand moving 1. Stand or sit with your left / right elbow bent and your hand at the height of your shoulder. 2. Move your elbow about 6 inches (15 cm) away from your body. 3. Gently and slowly wave your hand back and forth. 4. Repeat this motion for __________ seconds. Repeat __________ times. Complete this exercise __________ times a day. Exercise D: Ulnar nerve glide, elbow moving 1. Stand or sit with your left / right arm straight out to your side at shoulder height. 2. Bring your fingers up toward the ceiling so your palm faces away from you. 3. Bend your elbow and bring it to your side and bend your wrist so your palm now faces the floor. 4. Slowly go back and forth between doing the step 2 position and the step 3 position. 5. Repeat these motions for __________ seconds. Repeat __________  times. Complete this exercise __________ times a day. Strengthening exercises These exercises build strength and endurance in your forearm. Endurance is the ability to use your muscles for a long time, even after they get tired. Exercise E: Grip  1. Hold one of these items in your __________ hand: a dense sponge, a tennis ball, or a large, rolled sock. 2. Slowly squeeze as hard as you can without increasing  any pain. 3. Hold this position for __________ seconds. 4. Slowly release your grip. Repeat __________ times. Complete this exercise __________ times a day. This information is not intended to replace advice given to you by your health care provider. Make sure you discuss any questions you have with your health care provider. Document Released: 12/13/2005 Document Revised: 08/19/2016 Document Reviewed: 10/23/2015 Elsevier Interactive Patient Education  [employer].

## 2017-08-26 ENCOUNTER — Ambulatory Visit (INDEPENDENT_AMBULATORY_CARE_PROVIDER_SITE_OTHER): Payer: BLUE CROSS/BLUE SHIELD | Admitting: Family Medicine

## 2017-08-26 ENCOUNTER — Encounter: Payer: Self-pay | Admitting: Family Medicine

## 2017-08-26 VITALS — BP 97/63 | HR 81 | Temp 98.8°F | Resp 16 | Ht 71.0 in | Wt 205.0 lb

## 2017-08-26 DIAGNOSIS — F900 Attention-deficit hyperactivity disorder, predominantly inattentive type: Secondary | ICD-10-CM | POA: Diagnosis not present

## 2017-08-26 DIAGNOSIS — L700 Acne vulgaris: Secondary | ICD-10-CM

## 2017-08-26 DIAGNOSIS — E282 Polycystic ovarian syndrome: Secondary | ICD-10-CM | POA: Diagnosis not present

## 2017-08-26 DIAGNOSIS — F329 Major depressive disorder, single episode, unspecified: Secondary | ICD-10-CM

## 2017-08-26 DIAGNOSIS — F419 Anxiety disorder, unspecified: Secondary | ICD-10-CM | POA: Diagnosis not present

## 2017-08-26 DIAGNOSIS — Z23 Encounter for immunization: Secondary | ICD-10-CM | POA: Diagnosis not present

## 2017-08-26 MED ORDER — AMPHETAMINE-DEXTROAMPHET ER 10 MG PO CP24: 10.0000 mg | ORAL_CAPSULE | Freq: Two times a day (BID) | ORAL | 0 refills | Status: DC

## 2017-08-26 NOTE — Patient Instructions (Signed)
     IF you received an x-ray today, you will receive an invoice from [facility]. Please contact  Radiology at [phone] with questions or concerns regarding your invoice.   IF you received labwork today, you will receive an invoice from [facility]. Please contact [facility] at [phone] with questions or concerns regarding your invoice.   Our billing staff will not be able to assist you with questions regarding bills from these companies.  You will be contacted with the lab results as soon as they are available. The fastest way to get your results is to activate your My Chart account. Instructions are located on the last page of this paperwork. If you have not heard from us regarding the results in 2 weeks, please contact this office.     

## 2017-08-26 NOTE — Progress Notes (Signed)
Subjective:    Patient ID: Wanda Torres, female    DOB: [birth year], 26 y.o.   MRN: [id number]  08/26/2017  Medication Refill (ADDERALL and ZOLOFT)   HPI This 26 y.o. female presents for evaluation of ANXIETY AND ADHD.  So tired.  Exhausted.  No sadness; no anxiety.  MRSA admission; armpit.  Four day admission.  Went well.  Now stressed about every little thing; s/p lumbar puncture.    Newborn is one 18month old.   Delivery went well; 39 weeks.  Joey had a quiz the day went into labor. 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    Going back to work after six weeks; has two more weeks remaining.  Wearing brace.   Breastfeeding.  Taking Zoloft 50mg  daily.    Adderall XR not recommended in breastfeeding.     BP Readings from Last 3 Encounters:  08/26/17 97/63  06/27/17 119/74  04/14/17 110/70   Wt Readings from Last 3 Encounters:  08/26/17 205 lb (93 kg)  06/27/17 220 lb (99.8 kg)  04/14/17 207 lb (93.9 kg)   There is no immunization history for the selected administration types on file for this patient.  Review of Systems  Constitutional: Negative for chills, diaphoresis, fatigue and fever.  Eyes: Negative for visual disturbance.  Respiratory: Negative for cough and shortness of breath.   Cardiovascular: Negative for chest pain, palpitations and leg swelling.  Gastrointestinal: Negative for abdominal pain, constipation, diarrhea, nausea and vomiting.  Endocrine: Negative for cold intolerance, heat intolerance, polydipsia, polyphagia and polyuria.  Neurological: Negative for dizziness, tremors, seizures, syncope, facial asymmetry, speech difficulty, weakness, light-headedness, numbness and headaches.  Psychiatric/Behavioral: Positive for decreased concentration. Negative for dysphoric mood, self-injury and sleep disturbance. The patient is nervous/anxious.     Past Medical History:  Diagnosis Date  . Acne   . Allergy   . Anemia   . Anxiety     . Psoriasis   . Thyroid disease    No past surgical history on file. No Known Allergies  Social History   Social History  . Marital status: Single    Spouse name: N/A  . Number of children: N/A  . Years of education: N/A   Occupational History  .      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 Smoking status: Never Smoker  . Smokeless tobacco: Never Used  . Alcohol use Not on file  . Drug use: Unknown  . Sexual activity: Not on file   Other Topics Concern  . Not on file   Social History Narrative  . No narrative on file   No family history on file.     Objective:    BP 97/63 (BP Location: Right Arm, Patient Position: Sitting, Cuff Size: Large)   Pulse 81   Temp 98.8 F (37.1 C) (Oral)   Resp 16   Ht 5\' 11"  (1.803 m)   Wt 205 lb (93 kg)   LMP 10/18/2016   SpO2 97%   BMI 28.59 kg/m  Physical Exam  Constitutional: She is oriented to person, place, and time. She appears well-developed and well-nourished. No distress.  HENT:  Head: Normocephalic and atraumatic.  Right Ear: External ear normal.  Left Ear: External ear normal.  Nose: Nose normal.  Mouth/Throat: Oropharynx is clear and moist.  Eyes: Pupils are equal, round, and reactive to light. Conjunctivae and EOM are normal.  Neck: Normal range of motion. Neck supple.  Carotid bruit is not present. No thyromegaly present.  Cardiovascular: Normal rate, regular rhythm, normal heart sounds and intact distal pulses.  Exam reveals no gallop and no friction rub.   No murmur heard. Pulmonary/Chest: Effort normal and breath sounds normal. She has no wheezes. She has no rales.  Abdominal: Soft. Bowel sounds are normal. She exhibits no distension and no mass. There is no tenderness. There is no rebound and no guarding.  Lymphadenopathy:    She has no cervical adenopathy.  Neurological: She is alert and oriented to person, place, and time. No cranial nerve deficit.  Skin:  Skin is warm and dry. No rash noted. She is not diaphoretic. No erythema. No pallor.  Psychiatric: She has a normal mood and affect. Her behavior is normal. Judgment and thought content normal.    No results found. Depression screen [facility] 2/9 08/26/2017 06/27/2017 04/14/2017 10/20/2016 09/23/2016  Decreased Interest 0 0 0 0 1  Down, Depressed, Hopeless 0 0 0 0 3  PHQ - 2 Score 0 0 0 0 4  Altered sleeping - - - - 3  Tired, decreased energy - - - - 3  Change in appetite - - - - 3  Feeling bad or failure about yourself  - - - - 0  Trouble concentrating - - - - 3  Moving slowly or fidgety/restless - - - - 0  Suicidal thoughts - - - - 0  PHQ-9 Score - - - - 16  Difficult doing work/chores - - - - Very difficult   Fall Risk  08/26/2017 06/27/2017 04/14/2017 10/20/2016 07/07/2016  Falls in the past year? No No No No No        Assessment & Plan:   1. Anxiety and depression   2. Attention deficit hyperactivity disorder (ADHD), predominantly inattentive type   3. PCOS (polycystic ovarian syndrome)   4. Acne vulgaris   5. Need for prophylactic vaccination and inoculation against influenza    -doing well in postpartum period; appropriate stress with newborn with recent sepsis rule out admission.  Continue Zoloft at current dose. -refill of Adderall provided; cannot breastfeed while taking Adderall XR; pt expressed understanding; plans to breastfeed on weekends while not taking Adderall. Must have Adderall for returning to work. -prolonged face-to-face for 25 minutes with greater than 50% of time dedicated to counseling and coordination of care.   Orders Placed This Encounter  Procedures  . Flu Vaccine QUAD 36+ mos IM   Meds ordered this encounter  Medications  . DISCONTD: amphetamine-dextroamphetamine (ADDERALL XR) 10 MG 24 hr capsule    Sig: Take 1 capsule (10 mg total) by mouth 2 (two) times daily.    Dispense:  60 capsule    Refill:  0  . DISCONTD: amphetamine-dextroamphetamine (ADDERALL  XR) 10 MG 24 hr capsule    Sig: Take 1 capsule (10 mg total) by mouth 2 (two) times daily.    Dispense:  60 capsule    Refill:  0    DO NOT FILL FOR 30 DAYS AFTER PRESCRIBED  . amphetamine-dextroamphetamine (ADDERALL XR) 10 MG 24 hr capsule    Sig: Take 1 capsule (10 mg total) by mouth 2 (two) times daily.    Dispense:  60 capsule    Refill:  0    DO NOT FILL FOR 60 DAYS AFTER PRESCRIBED    Return in about 3 months (around 11/25/2017) for recheck ADHD and anxiety.   Timm Bonenberger Paulita Fujita, M.D. Primary Care at [facility] previously  [facility] [address] [place], [place]  [address] [phone] phone [phone] fax

## 2017-10-13 ENCOUNTER — Encounter: Payer: Self-pay | Admitting: Family Medicine

## 2017-10-13 ENCOUNTER — Ambulatory Visit (INDEPENDENT_AMBULATORY_CARE_PROVIDER_SITE_OTHER): Payer: BLUE CROSS/BLUE SHIELD | Admitting: Family Medicine

## 2017-10-13 VITALS — BP 102/68 | HR 130 | Temp 98.0°F | Resp 16 | Ht 72.05 in | Wt 212.0 lb

## 2017-10-13 DIAGNOSIS — F32A Depression, unspecified: Secondary | ICD-10-CM

## 2017-10-13 DIAGNOSIS — F419 Anxiety disorder, unspecified: Secondary | ICD-10-CM | POA: Diagnosis not present

## 2017-10-13 DIAGNOSIS — F329 Major depressive disorder, single episode, unspecified: Secondary | ICD-10-CM

## 2017-10-13 DIAGNOSIS — F900 Attention-deficit hyperactivity disorder, predominantly inattentive type: Secondary | ICD-10-CM | POA: Diagnosis not present

## 2017-10-13 MED ORDER — AMPHETAMINE-DEXTROAMPHET ER 25 MG PO CP24: 25.0000 mg | ORAL_CAPSULE | Freq: Every day | ORAL | 0 refills | Status: DC

## 2017-10-13 MED ORDER — AMPHETAMINE-DEXTROAMPHETAMINE 5 MG PO TABS: 5.0000 mg | ORAL_TABLET | Freq: Every day | ORAL | 0 refills | Status: DC

## 2017-10-13 NOTE — Patient Instructions (Signed)
     IF you received an x-ray today, you will receive an invoice from [facility]. Please contact [facility] at [phone] with questions or concerns regarding your invoice.   IF you received labwork today, you will receive an invoice from [facility]. Please contact [facility] at [phone] with questions or concerns regarding your invoice.   Our billing staff will not be able to assist you with questions regarding bills from these companies.  You will be contacted with the lab results as soon as they are available. The fastest way to get your results is to activate your My Chart account. Instructions are located on the last page of this paperwork. If you have not heard from us regarding the results in 2 weeks, please contact this office.     

## 2017-10-13 NOTE — Progress Notes (Signed)
Subjective:    Patient ID: Wanda Torres, female    DOB: 07-03-1991, 26 y.o.   MRN: [id number]  10/13/2017  ADHD (follow-up ) and Anxiety    HPI This 26 y.o. female presents for six week follow-up of anxiety/depression, ADHD.  Returning early because of poor concentration.  Has returned to work; returned on 09/29/17.  Struggling at work.  Getting up at 2:00am for diaper change; then feeds at 5:00am.  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   Taking Adderall XR 20mg  at 6:00am. Works 9:30-6:00. Leaving at 3:00-4:00.   BP Readings from Last 3 Encounters:  10/13/17 102/68  08/26/17 97/63  06/27/17 119/74   Wt Readings from Last 3 Encounters:  10/13/17 212 lb (96.2 kg)  08/26/17 205 lb (93 kg)  06/27/17 220 lb (99.8 kg)   Immunization History  Administered Date(s) Administered  . Influenza,inj,Quad PF,6+ Mos 08/26/2017    Review of Systems  Constitutional: Negative for chills, diaphoresis, fatigue and fever.  Eyes: Negative for visual disturbance.  Respiratory: Negative for cough and shortness of breath.   Cardiovascular: Negative for chest pain, palpitations and leg swelling.  Gastrointestinal: Negative for abdominal pain, constipation, diarrhea, nausea and vomiting.  Endocrine: Negative for cold intolerance, heat intolerance, polydipsia, polyphagia and polyuria.  Neurological: Negative for dizziness, tremors, seizures, syncope, facial asymmetry, speech difficulty, weakness, light-headedness, numbness and headaches.  Psychiatric/Behavioral: Positive for decreased concentration. Negative for dysphoric mood, self-injury and sleep disturbance. The patient is not nervous/anxious.     Past Medical History:  Diagnosis Date  . Acne   . Allergy   . Anemia   . Anxiety   . Psoriasis   . Thyroid disease    History reviewed. No pertinent surgical history. No Known Allergies Current Outpatient Prescriptions on File Prior to Visit  Medication Sig Dispense Refill  . sertraline (ZOLOFT) 50 MG  tablet Take 1 tablet (50 mg total) by mouth daily. 30 tablet 5   No current facility-administered medications on file prior to visit.    Social History   Social History  . Marital status: Single    Spouse name: N/A  . Number of children: N/A  . Years of education: N/A   Occupational History  .      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 Smoking status: Never Smoker  . Smokeless tobacco: Never Used  . Alcohol use Not on file  . Drug use: Unknown  . Sexual activity: Not on file   Other Topics Concern  . Not on file   Social History Narrative  . No narrative on file   History reviewed. No pertinent family history.     Objective:    BP 102/68   Pulse (!) 130   Temp 98 F (36.7 C) (Oral)   Resp 16   Ht 6' 0.05" (1.83 m)   Wt 212 lb (96.2 kg)   LMP 10/18/2016   SpO2 97%   Breastfeeding? Unknown   BMI 28.71 kg/m  Physical Exam  Constitutional: She is oriented to person, place, and time. She appears well-developed and well-nourished. No distress.  HENT:  Head: Normocephalic and atraumatic.  Eyes: Pupils are equal, round, and reactive to light. Conjunctivae are normal.  Neck: Normal range of motion. Neck supple.  Cardiovascular: Normal rate, regular rhythm and normal heart sounds.  Exam reveals no gallop and no friction rub.   No murmur heard. Pulmonary/Chest: Effort normal and breath sounds normal. She has no wheezes. She has no  rales.  Neurological: She is alert and oriented to person, place, and time.  Skin: She is not diaphoretic.  Psychiatric: She has a normal mood and affect. Her behavior is normal. Judgment and thought content normal.  Nursing note and vitals reviewed.  No results found. Depression screen [facility] 2/9 10/13/2017 08/26/2017 06/27/2017 04/14/2017 10/20/2016  Decreased Interest 0 0 0 0 0  Down, Depressed, Hopeless 0 0 0 0 0  PHQ - 2 Score 0 0 0 0 0  Altered sleeping - - - - -  Tired, decreased energy  - - - - -  Change in appetite - - - - -  Feeling bad or failure about yourself  - - - - -  Trouble concentrating - - - - -  Moving slowly or fidgety/restless - - - - -  Suicidal thoughts - - - - -  PHQ-9 Score - - - - -  Difficult doing work/chores - - - - -   Fall Risk  10/13/2017 08/26/2017 06/27/2017 04/14/2017 10/20/2016  Falls in the past year? No No No No No        Assessment & Plan:   1. Anxiety and depression   2. Attention deficit hyperactivity disorder (ADHD), predominantly inattentive type     -anxiety and depression well controlled on current regimen; coping well with stressors of newborn. -uncontrolled ADHD since returning to work; increase Adderall XR to 25mg  daily and add Adderall 5mg  after lunch.  No orders of the defined types were placed in this encounter.  Meds ordered this encounter  Medications  . amphetamine-dextroamphetamine (ADDERALL XR) 25 MG 24 hr capsule    Sig: Take 1 capsule by mouth daily.    Dispense:  32 capsule    Refill:  0  . amphetamine-dextroamphetamine (ADDERALL) 5 MG tablet    Sig: Take 1 tablet (5 mg total) by mouth daily after lunch.    Dispense:  30 tablet    Refill:  0    No Follow-up on file.   Wanda Torres, M.D. Primary Care at [facility] [address] [place], [place]  [id number] [phone] phone [phone] fax

## 2017-10-19 ENCOUNTER — Other Ambulatory Visit: Payer: Self-pay | Admitting: Family Medicine

## 2017-11-14 ENCOUNTER — Ambulatory Visit: Payer: BLUE CROSS/BLUE SHIELD | Admitting: Family Medicine

## 2017-11-25 ENCOUNTER — Ambulatory Visit: Payer: BLUE CROSS/BLUE SHIELD | Admitting: Family Medicine

## 2017-11-25 ENCOUNTER — Other Ambulatory Visit: Payer: Self-pay

## 2017-11-25 ENCOUNTER — Encounter: Payer: Self-pay | Admitting: Family Medicine

## 2017-11-25 VITALS — BP 116/72 | HR 101 | Temp 99.0°F | Resp 16 | Ht 71.26 in | Wt 217.0 lb

## 2017-11-25 DIAGNOSIS — F329 Major depressive disorder, single episode, unspecified: Secondary | ICD-10-CM

## 2017-11-25 DIAGNOSIS — F900 Attention-deficit hyperactivity disorder, predominantly inattentive type: Secondary | ICD-10-CM

## 2017-11-25 DIAGNOSIS — F419 Anxiety disorder, unspecified: Secondary | ICD-10-CM | POA: Diagnosis not present

## 2017-11-25 MED ORDER — AMPHETAMINE-DEXTROAMPHET ER 25 MG PO CP24: 25.0000 mg | ORAL_CAPSULE | Freq: Every day | ORAL | 0 refills | Status: DC

## 2017-11-25 MED ORDER — AMPHETAMINE-DEXTROAMPHETAMINE 10 MG PO TABS: 10.0000 mg | ORAL_TABLET | Freq: Every day | ORAL | 0 refills | Status: DC

## 2017-11-25 NOTE — Patient Instructions (Signed)
     IF you received an x-ray today, you will receive an invoice from [facility]. Please contact [facility] at [phone] with questions or concerns regarding your invoice.   IF you received labwork today, you will receive an invoice from [facility]. Please contact [facility] at [phone] with questions or concerns regarding your invoice.   Our billing staff will not be able to assist you with questions regarding bills from these companies.  You will be contacted with the lab results as soon as they are available. The fastest way to get your results is to activate your My Chart account. Instructions are located on the last page of this paperwork. If you have not heard from us regarding the results in 2 weeks, please contact this office.     

## 2017-11-25 NOTE — Progress Notes (Signed)
Subjective:    Patient ID: Wanda Torres, female    DOB: [birth year], 26 y.o.   MRN: [id number]  11/25/2017  Depression (4 week follow-up ) and Anxiety    HPI This 26 y.o. female presents for evaluation of anxiety/depression and ADHD.  Increased Adderall XR at last visit and also added Adderall 5mg  every afternoon.  Great improvement in work performance with adjustments.  Anxiety has greatly improved with improved sleep.  Tolerating medications without side effects.  Sleeping better. Four months old.  Ready to exercise. Got to get in shape; plans to get in shape to get pregnant again.  Anxiety is fine.  Remember to take medication.   Brain is working better.   Adderall XR 25mg  is good.  Adderall 5mg  at 2:00-3:00 is not effective.     BP Readings from Last 3 Encounters:  11/25/17 116/72  10/13/17 102/68  08/26/17 97/63   Wt Readings from Last 3 Encounters:  11/25/17 217 lb (98.4 kg)  10/13/17 212 lb (96.2 kg)  08/26/17 205 lb (93 kg)   Immunization History  Administered Date(s) Administered  . Influenza,inj,Quad PF,6+ Mos 08/26/2017    Review of Systems  Constitutional: Negative for chills, diaphoresis, fatigue and fever.  Eyes: Negative for visual disturbance.  Respiratory: Negative for cough and shortness of breath.   Cardiovascular: Negative for chest pain, palpitations and leg swelling.  Endocrine: Negative for cold intolerance, heat intolerance, polydipsia, polyphagia and polyuria.  Neurological: Negative for dizziness, tremors, seizures, syncope, facial asymmetry, speech difficulty, weakness, light-headedness, numbness and headaches.  Psychiatric/Behavioral: Positive for decreased concentration and sleep disturbance. Negative for self-injury and suicidal ideas. The patient is nervous/anxious.     Past Medical History:  Diagnosis Date  . Acne   . Allergy   . Anemia   . Anxiety   . Psoriasis   . Thyroid disease    History reviewed. No pertinent  surgical history. No Known Allergies Current Outpatient Medications on File Prior to Visit  Medication Sig Dispense Refill  . sertraline (ZOLOFT) 50 MG tablet TAKE 1 TABLET BY MOUTH ONCE DAILY 30 tablet 0   No current facility-administered medications on file prior to visit.    Social History   Socioeconomic History  . Marital status: Single    Spouse name: Not on file  . Number of children: Not on file  . Years of education: Not on file  . Highest education level: Not on file  Social Needs  . Financial resource strain: Not on file  . Food insecurity - worry: Not on file  . Food insecurity - inability: Not on file  . Transportation needs - medical: Not on file  . 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 Smoking status: Never Smoker  . Smokeless tobacco: Never Used  Substance and Sexual Activity  . Alcohol use: Not on file  . Drug use: Not on file  . Sexual activity: Not on file  Other Topics Concern  . Not on file  Social History Narrative  . Not on file   History reviewed. No pertinent family history.     Objective:    BP 116/72   Pulse (!) 101   Temp 99 F (37.2 C) (Oral)   Resp 16   Ht 5' 11.26" (1.81 m)   Wt 217 lb (98.4 kg)   SpO2 97%   BMI 30.04 kg/m  Physical Exam  Constitutional: She is oriented to person, place, and time. She appears well-developed and well-nourished. No distress.  HENT:  Head: Normocephalic and atraumatic.  Right Ear: External ear normal.  Left Ear: External ear normal.  Nose: Nose normal.  Mouth/Throat: Oropharynx is clear and moist.  Eyes: Conjunctivae and EOM are normal. Pupils are equal, round, and reactive to light.  Neck: Normal range of motion. Neck supple. Carotid bruit is not present. No thyromegaly present.  Cardiovascular: Normal rate, regular rhythm, normal heart sounds and intact distal pulses. Exam reveals  no gallop and no friction rub.  No murmur heard. Pulmonary/Chest: Effort normal and breath sounds normal. She has no wheezes. She has no rales.  Abdominal: Soft. Bowel sounds are normal. She exhibits no distension and no mass. There is no tenderness. There is no rebound and no guarding.  Lymphadenopathy:    She has no cervical adenopathy.  Neurological: She is alert and oriented to person, place, and time. No cranial nerve deficit.  Skin: Skin is warm and dry. No rash noted. She is not diaphoretic. No erythema. No pallor.  Psychiatric: She has a normal mood and affect. Her behavior is normal.   No results found. [facility] screen [facility] 2/9 11/25/2017 10/13/2017 08/26/2017 06/27/2017 04/14/2017  Decreased Interest 0 0 0 0 0  Down, Depressed, Hopeless 0 0 0 0 0  PHQ - 2 Score 0 0 0 0 0  Altered sleeping - - - - -  Tired, decreased energy - - - - -  Change in appetite - - - - -  Feeling bad or failure about yourself  - - - - -  Trouble concentrating - - - - -  Moving slowly or fidgety/restless - - - - -  Suicidal thoughts - - - - -  PHQ-9 Score - - - - -  Difficult doing work/chores - - - - -   Fall Risk  11/25/2017 10/13/2017 08/26/2017 06/27/2017 04/14/2017  Falls in the past year? No No No No No        Assessment & Plan:   1. Attention deficit hyperactivity disorder (ADHD), predominantly inattentive type   2. Anxiety and depression    Improved control of attention deficit with increase of Adderall to 25 mg and adding short acting Adderall in the afternoons.  Will increase afternoon Adderall dose to 10 mg.  Refills for 2 months provided today at visit.  Patient return in 2 months for follow-up.  Anxiety much improved with adjustment to newborn baby.  Newborn is also sleeping much better and patient is sleeping better.  No changes in sertraline dose at this time.  Recommend exercise for stress management and weight management.  Patient plans to start an exercise regimen in anticipation of  attempting pregnancy again in approximately 1 year.   No orders of the defined types were placed in this encounter.  Meds ordered this encounter  Medications  . DISCONTD: amphetamine-dextroamphetamine (ADDERALL) 10 MG tablet    Sig: Take 1 tablet (10 mg total) by mouth daily.    Dispense:  30 tablet    Refill:  0  . DISCONTD: amphetamine-dextroamphetamine (ADDERALL XR) 25 MG 24 hr capsule    Sig: Take 1 capsule by mouth daily.    Dispense:  32 capsule    Refill:  0  . amphetamine-dextroamphetamine (ADDERALL) 10 MG tablet    Sig: Take 1 tablet (10 mg total) by mouth daily.    Dispense:  32 tablet    Refill:  0  Do not fill for 30 days after filled  . amphetamine-dextroamphetamine (ADDERALL XR) 25 MG 24 hr capsule    Sig: Take 1 capsule by mouth daily.    Dispense:  32 capsule    Refill:  0    Do not fill for 30 days after prescribed.    Return in about 2 months (around 01/25/2018) for follow-up chronic medical conditions.   Donalda Job Paulita Fujita, M.D. Primary Care at [facility] previously [facility] [address] [place], [place]  [address] [phone] phone [phone] fax

## 2017-11-30 ENCOUNTER — Ambulatory Visit: Payer: BLUE CROSS/BLUE SHIELD | Admitting: Family Medicine

## 2018-01-02 ENCOUNTER — Other Ambulatory Visit: Payer: Self-pay | Admitting: Family Medicine

## 2018-01-25 ENCOUNTER — Ambulatory Visit: Payer: BLUE CROSS/BLUE SHIELD | Admitting: Family Medicine

## 2018-01-26 ENCOUNTER — Other Ambulatory Visit: Payer: Self-pay | Admitting: Family Medicine

## 2018-01-27 ENCOUNTER — Encounter: Payer: Self-pay | Admitting: Family Medicine

## 2018-01-27 ENCOUNTER — Other Ambulatory Visit: Payer: Self-pay | Admitting: Family Medicine

## 2018-01-27 NOTE — Telephone Encounter (Signed)
Copied from CRM [phone]#[id number]. Topic: Quick Communication - Rx Refill/Question >> Jan 27, 2018  3:20 PM Cipriano BunkerLambe, Annette S wrote: Medication: Adderall    Has the patient contacted their pharmacy? Yes.     (Agent: If no, request that the patient contact the pharmacy for the refill.)   Preferred Pharmacy (with phone number or street name): [facility] [id number] - [place], [place] - [address] [place] [address] Phone: [phone] Fax: [phone]     Agent: Please be advised that RX refills may take up to 3 business days. We ask that you follow-up with your pharmacy.

## 2018-01-27 NOTE — Telephone Encounter (Signed)
Requesting refill of Adderall  LOV 11/25/17 with Dr. Katrinka BlazingSmith  [facility] 11[facility]  #32  0 refills  Preferred Pharmacy (with phone number or street name): [facility] [id number] - [place], [place] - [address] [place] [address] Phone: [phone] Fax: [phone]

## 2018-01-30 NOTE — Telephone Encounter (Addendum)
°  Relation to pt: self Call back number: [phone] [id number] [address] [place] [address] [phone] (Phone) [phone] (Fax)     Reason for call:  Patient checking on the status of message below, informed patient please allow 48 to 72 hour turn around time, patient voice understanding

## 2018-01-31 MED ORDER — AMPHETAMINE-DEXTROAMPHET ER 25 MG PO CP24: 25.0000 mg | ORAL_CAPSULE | Freq: Every day | ORAL | 0 refills | Status: DC

## 2018-01-31 MED ORDER — AMPHETAMINE-DEXTROAMPHETAMINE 10 MG PO TABS: 10.0000 mg | ORAL_TABLET | Freq: Every day | ORAL | 0 refills | Status: DC

## 2018-01-31 NOTE — Telephone Encounter (Signed)
[facility]  Per [place] Controlled Substance Registry, last refilled 1/5 and 1/8; so patient should not be out of medication.

## 2018-01-31 NOTE — Telephone Encounter (Signed)
Left detailed message per Release, Rx's sent and per MD notes, Pt advised to call us back if she has any further questions or concerns

## 2018-02-27 ENCOUNTER — Other Ambulatory Visit: Payer: Self-pay | Admitting: Family Medicine

## 2018-02-27 NOTE — Telephone Encounter (Signed)
Copied from CRM [id number]. Topic: Quick Communication - Rx Refill/Question >> Feb 27, 2018 12:38 PM Raquel SarnaHayes, Teresa G wrote: Adderall 10 mg Adderall - Extended 25 mg  Pt is out of refills.   [facility] [id number] - [place] - [address] [facility] [address] [place] [phone] Phone: [phone] Fax: [phone]

## 2018-02-28 NOTE — Telephone Encounter (Signed)
LOV 11/23/17 Dr. Nilda SimmerKristi Smith [facility] [address]

## 2018-03-02 ENCOUNTER — Other Ambulatory Visit: Payer: Self-pay | Admitting: Family Medicine

## 2018-03-06 ENCOUNTER — Encounter: Payer: Self-pay | Admitting: Family Medicine

## 2018-03-06 ENCOUNTER — Ambulatory Visit: Payer: Self-pay | Admitting: Family Medicine

## 2018-03-06 VITALS — BP 108/70 | HR 101 | Temp 97.6°F | Resp 16 | Ht 72.0 in | Wt 223.3 lb

## 2018-03-06 DIAGNOSIS — F329 Major depressive disorder, single episode, unspecified: Secondary | ICD-10-CM

## 2018-03-06 DIAGNOSIS — E282 Polycystic ovarian syndrome: Secondary | ICD-10-CM

## 2018-03-06 DIAGNOSIS — R635 Abnormal weight gain: Secondary | ICD-10-CM

## 2018-03-06 DIAGNOSIS — F419 Anxiety disorder, unspecified: Secondary | ICD-10-CM

## 2018-03-06 DIAGNOSIS — F900 Attention-deficit hyperactivity disorder, predominantly inattentive type: Secondary | ICD-10-CM

## 2018-03-06 MED ORDER — AMPHETAMINE-DEXTROAMPHET ER 10 MG PO CP24: 10.0000 mg | ORAL_CAPSULE | Freq: Every day | ORAL | 0 refills | Status: DC

## 2018-03-06 MED ORDER — AMPHETAMINE-DEXTROAMPHETAMINE 10 MG PO TABS: 10.0000 mg | ORAL_TABLET | Freq: Every day | ORAL | 0 refills | Status: DC

## 2018-03-06 NOTE — Patient Instructions (Addendum)
60-100 grams of protein per day; 20 grams per meal of protein.   IF you received an x-ray today, you will receive an invoice from [facility]. Please contact [facility] at [phone] with questions or concerns regarding your invoice.   IF you received labwork today, you will receive an invoice from [place]. Please contact [facility] at [phone] with questions or concerns regarding your invoice.   Our billing staff will not be able to assist you with questions regarding bills from these companies.  You will be contacted with the lab results as soon as they are available. The fastest way to get your results is to activate your My Chart account. Instructions are located on the last page of this paperwork. If you have not heard from [place] regarding the results in 2 weeks, please contact this office.      Calorie Counting for Weight Loss Calories are units of energy. Your body needs a certain amount of calories from food to keep you going throughout the day. When you eat more calories than your body needs, your body stores the extra calories as fat. When you eat fewer calories than your body needs, your body burns fat to get the energy it needs. Calorie counting means keeping track of how many calories you eat and drink each day. Calorie counting can be helpful if you need to lose weight. If you make sure to eat fewer calories than your body needs, you should lose weight. Ask your health care provider what a healthy weight is for you. For calorie counting to work, you will need to eat the right number of calories in a day in order to lose a healthy amount of weight per week. A dietitian can help you determine how many calories you need in a day and will give you suggestions on how to reach your calorie goal.  A healthy amount of weight to lose per week is usually 1-2 lb (0.5-0.9 kg). This usually means that your daily calorie intake should be reduced by 500-750  calories.  Eating 1,200 - 1,500 calories per day can help most women lose weight.  Eating 1,500 - 1,800 calories per day can help most men lose weight.  What is my plan? My goal is to have ____1500-1800______ calories per day. If I have this many calories per day, I should lose around __________ pounds per week. What do I need to know about calorie counting? In order to meet your daily calorie goal, you will need to:  Find out how many calories are in each food you would like to eat. Try to do this before you eat.  Decide how much of the food you plan to eat.  Write down what you ate and how many calories it had. Doing this is called keeping a food log.  To successfully lose weight, it is important to balance calorie counting with a healthy lifestyle that includes regular activity. Aim for 150 minutes of moderate exercise (such as walking) or 75 minutes of vigorous exercise (such as running) each week. Where do I find calorie information?  The number of calories in a food can be found on a Nutrition Facts label. If a food does not have a Nutrition Facts label, try to look up the calories online or ask your dietitian for help. Remember that calories are listed per serving. If you choose to have more than one serving of a food, you will have to multiply the calories per serving by the amount of  servings you plan to eat. For example, the label on a package of bread might say that a serving size is 1 slice and that there are 90 calories in a serving. If you eat 1 slice, you will have eaten 90 calories. If you eat 2 slices, you will have eaten 180 calories. How do I keep a food log? Immediately after each meal, record the following information in your food log:  What you ate. Don't forget to include toppings, sauces, and other extras on the food.  How much you ate. This can be measured in cups, ounces, or number of items.  How many calories each food and drink had.  The total number of  calories in the meal.  Keep your food log near you, such as in a small notebook in your pocket, or use a mobile app or website. Some programs will calculate calories for you and show you how many calories you have left for the day to meet your goal. What are some calorie counting tips?  Use your calories on foods and drinks that will fill you up and not leave you hungry: ? Some examples of foods that fill you up are nuts and nut butters, vegetables, lean proteins, and high-fiber foods like whole grains. High-fiber foods are foods with more than 5 g fiber per serving. ? Drinks such as sodas, specialty coffee drinks, alcohol, and juices have a lot of calories, yet do not fill you up.  Eat nutritious foods and avoid empty calories. Empty calories are calories you get from foods or beverages that do not have many vitamins or protein, such as candy, sweets, and soda. It is better to have a nutritious high-calorie food (such as an avocado) than a food with few nutrients (such as a bag of chips).  Know how many calories are in the foods you eat most often. This will help you calculate calorie counts faster.  Pay attention to calories in drinks. Low-calorie drinks include water and unsweetened drinks.  Pay attention to nutrition labels for "low fat" or "fat free" foods. These foods sometimes have the same amount of calories or more calories than the full fat versions. They also often have added sugar, starch, or salt, to make up for flavor that was removed with the fat.  Find a way of tracking calories that works for you. Get creative. Try different apps or programs if writing down calories does not work for you. What are some portion control tips?  Know how many calories are in a serving. This will help you know how many servings of a certain food you can have.  Use a measuring cup to measure serving sizes. You could also try weighing out portions on a kitchen scale. With time, you will be able to  estimate serving sizes for some foods.  Take some time to put servings of different foods on your favorite plates, bowls, and cups so you know what a serving looks like.  Try not to eat straight from a bag or box. Doing this can lead to overeating. Put the amount you would like to eat in a cup or on a plate to make sure you are eating the right portion.  Use smaller plates, glasses, and bowls to prevent overeating.  Try not to multitask (for example, watch TV or use your computer) while eating. If it is time to eat, sit down at a table and enjoy your food. This will help you to know when you are full.  It will also help you to be aware of what you are eating and how much you are eating. What are tips for following this plan? Reading food labels  Check the calorie count compared to the serving size. The serving size may be smaller than what you are used to eating.  Check the source of the calories. Make sure the food you are eating is high in vitamins and protein and low in saturated and trans fats. Shopping  Read nutrition labels while you shop. This will help you make healthy decisions before you decide to purchase your food.  Make a grocery list and stick to it. Cooking  Try to cook your favorite foods in a healthier way. For example, try baking instead of frying.  Use low-fat dairy products. Meal planning  Use more fruits and vegetables. Half of your plate should be fruits and vegetables.  Include lean proteins like poultry and fish. How do I count calories when eating out?  Ask for smaller portion sizes.  Consider sharing an entree and sides instead of getting your own entree.  If you get your own entree, eat only half. Ask for a box at the beginning of your meal and put the rest of your entree in it so you are not tempted to eat it.  If calories are listed on the menu, choose the lower calorie options.  Choose dishes that include vegetables, fruits, whole grains, low-fat  dairy products, and lean protein.  Choose items that are boiled, broiled, grilled, or steamed. Stay away from items that are buttered, battered, fried, or served with cream sauce. Items labeled "crispy" are usually fried, unless stated otherwise.  Choose water, low-fat milk, unsweetened iced tea, or other drinks without added sugar. If you want an alcoholic beverage, choose a lower calorie option such as a glass of wine or light beer.  Ask for dressings, sauces, and syrups on the side. These are usually high in calories, so you should limit the amount you eat.  If you want a salad, choose a garden salad and ask for grilled meats. Avoid extra toppings like bacon, cheese, or fried items. Ask for the dressing on the side, or ask for olive oil and vinegar or lemon to use as dressing.  Estimate how many servings of a food you are given. For example, a serving of cooked rice is  cup or about the size of half a baseball. Knowing serving sizes will help you be aware of how much food you are eating at restaurants. The list below tells you how big or small some common portion sizes are based on everyday objects: ? 1 oz-4 stacked dice. ? 3 oz-1 deck of cards. ? 1 tsp-1 die. ? 1 Tbsp- a ping-pong ball. ? 2 Tbsp-1 ping-pong ball. ?  cup- baseball. ? 1 cup-1 baseball. Summary  Calorie counting means keeping track of how many calories you eat and drink each day. If you eat fewer calories than your body needs, you should lose weight.  A healthy amount of weight to lose per week is usually 1-2 lb (0.5-0.9 kg). This usually means reducing your daily calorie intake by 500-750 calories.  The number of calories in a food can be found on a Nutrition Facts label. If a food does not have a Nutrition Facts label, try to look up the calories online or ask your dietitian for help.  Use your calories on foods and drinks that will fill you up, and not on foods and drinks  that will leave you hungry.  Use smaller  plates, glasses, and bowls to prevent overeating. This information is not intended to replace advice given to you by your health care provider. Make sure you discuss any questions you have with your health care provider. Document Released: 12/13/2005 Document Revised: 11/12/2016 Document Reviewed: 11/12/2016 Elsevier Interactive Patient Education  [employer]2018 Elsevier Inc.

## 2018-03-06 NOTE — Progress Notes (Signed)
Subjective:    Patient ID: Wanda HarbourElizabeth A Rosetti, female    DOB: 01-08-1991, 27 y.o.   MRN: [id number]  03/06/2018  ADHD (3 month follow-up )    HPI This 27 y.o. female presents for evaluation of ADHD and anxiety.   No longer working at [employer] any longer.  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  Plans to do school online.   Getting bored. Not insane.  Daughter is 2seven years old.   Bus drops daughter in afternoon. Wants to decrease Adderall 10 and 10. Very different ---- Adderall IR is like drinking a cup of coffee; immediate; XR is not interchangeable.   Anxiety has been fine;considering to adjust Zoloft when quit work in December 2018.  Ran out of Zoloft 50mg  until a couple of days ago. Breastfeeding and pumping.  Gaining weight.  Exercising yoga every day.  Cold feet.  No longer smoking for past year.  BP Readings from Last 3 Encounters:  03/06/18 108/70  11/25/17 116/72  10/13/17 102/68   Wt Readings from Last 3 Encounters:  03/06/18 223 lb 4.8 oz (101.3 kg)  11/25/17 217 lb (98.4 kg)  10/13/17 212 lb (96.2 kg)   Immunization History  Administered Date(s) Administered  . Influenza,inj,Quad PF,6+ Mos 08/26/2017  . Tdap 09/05/2010    Review of Systems  Constitutional: Negative for chills, diaphoresis, fatigue and fever.  HENT: Negative for ear pain, postnasal drip, rhinorrhea, sinus pressure, sore throat and trouble swallowing.   Respiratory: Negative for cough and shortness of breath.   Cardiovascular: Negative for chest pain, palpitations and leg swelling.  Gastrointestinal: Negative for abdominal pain, constipation, diarrhea, nausea and vomiting.  Psychiatric/Behavioral: Positive for decreased concentration. Negative for dysphoric mood, self-injury, sleep disturbance and suicidal ideas. The patient is not nervous/anxious and is not hyperactive.     Past Medical History:  Diagnosis Date  . Acne   . Allergy   . Anemia   .  Anxiety   . Psoriasis   . Thyroid disease    History reviewed. No pertinent surgical history. No Known Allergies Current Outpatient Medications on File Prior to Visit  Medication Sig Dispense Refill  . sertraline (ZOLOFT) 50 MG tablet TAKE 1 TABLET BY MOUTH DAILY 30 tablet 0   No current facility-administered medications on file prior to visit.    Social History   Socioeconomic History  . Marital status: Single    Spouse name: Not on file  . Number of children: Not on file  . Years of education: Not on file  . Highest education level: Not on file  Occupational History    Comment: Supervisor reservations; 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[employer]  Social Needs  . Financial resource strain: Not on file  . Food insecurity:    Worry: Not on file    Inability: Not on file  . Transportation needs:    Medical: Not on file    Non-medical: Not on file  Tobacco Use  . Smoking status: Never Smoker  . Smokeless tobacco: Never Used  Substance and Sexual Activity  . Alcohol use: Not on file  . Drug use: Not on file  . Sexual activity: Not on file  Lifestyle  . Physical activity:    Days per week: Not on file    Minutes per session: Not on file  . Stress: Not on file  Relationships  . Social connections:    Talks on phone: Not on file    Gets together: Not on file  Attends religious service: Not on file    Active member of club or organization: Not on file    Attends meetings of clubs or organizations: Not on file    Relationship status: Not on file  . Intimate partner violence:    Fear of current or ex partner: Not on file    Emotionally abused: Not on file    Physically abused: Not on file    Forced sexual activity: Not on file  Other Topics Concern  . Not on file  Social History Narrative  . Not on file   History reviewed. No pertinent family history.     Objective:    BP 108/70 (BP Location: Left Arm, Patient Position: Sitting, Cuff Size: Large)   Pulse (!) 101   Temp  97.6 F (36.4 C) (Oral)   Resp 16   Ht 6' (1.829 m)   Wt 223 lb 4.8 oz (101.3 kg)   SpO2 99%   BMI 30.28 kg/m  Physical Exam  Constitutional: She is oriented to person, place, and time. She appears well-developed and well-nourished. No distress.  HENT:  Head: Normocephalic and atraumatic.  Eyes: Conjunctivae are normal. Pupils are equal, round, and reactive to light.  Neck: Normal range of motion. Neck supple.  Cardiovascular: Normal rate, regular rhythm and normal heart sounds. Exam reveals no gallop and no friction rub.  No murmur heard. Pulmonary/Chest: Effort normal and breath sounds normal. She has no wheezes. She has no rales.  Neurological: She is alert and oriented to person, place, and time.  Skin: She is not diaphoretic.  Psychiatric: She has a normal mood and affect. Her behavior is normal.  Nursing note and vitals reviewed.  No results found. Depression screen [facility] 2/9 03/06/2018 11/25/2017 10/13/2017 08/26/2017 06/27/2017  Decreased Interest 0 0 0 0 0  Down, Depressed, Hopeless 0 0 0 0 0  PHQ - 2 Score 0 0 0 0 0  Altered sleeping - - - - -  Tired, decreased energy - - - - -  Change in appetite - - - - -  Feeling bad or failure about yourself  - - - - -  Trouble concentrating - - - - -  Moving slowly or fidgety/restless - - - - -  Suicidal thoughts - - - - -  PHQ-9 Score - - - - -  Difficult doing work/chores - - - - -   Fall Risk  03/06/2018 11/25/2017 10/13/2017 08/26/2017 06/27/2017  Falls in the past year? No No No No No        Assessment & Plan:   1. Anxiety and depression   2. Attention deficit hyperactivity disorder (ADHD), predominantly inattentive type   3. PCOS (polycystic ovarian syndrome)   4. Weight gain    Anxiety depression: Well-controlled on Zoloft therapy. ADHD: Controlled.  Will adjust therapy now but no longer employed.  Prescription for Adderall XR 10 mg every morning provided.  Also prescribed Adderall short-acting 10 mg every  afternoon. Unintentional weight gain: Obtain thyroid panel. Recommend weight loss, exercise for 30-60 minutes five days per week; recommend 1600 kcal restriction per day with a minimum of 60 grams of protein per day. PCOS: Chronic.  Obtain labs.  Recommend weight loss, exercise, low-fat food choices.   Orders Placed This Encounter  Procedures  . TSH  . T4, free  . Testosterone  . Progesterone  . Comprehensive metabolic panel   Meds ordered this encounter  Medications  . DISCONTD: amphetamine-dextroamphetamine (ADDERALL XR) 10 MG  24 hr capsule    Sig: Take 1-2 capsules (10-20 mg total) by mouth daily.    Dispense:  60 capsule    Refill:  0  . DISCONTD: amphetamine-dextroamphetamine (ADDERALL) 10 MG tablet    Sig: Take 1 tablet (10 mg total) by mouth daily.    Dispense:  30 tablet    Refill:  0  . DISCONTD: amphetamine-dextroamphetamine (ADDERALL) 10 MG tablet    Sig: Take 1 tablet (10 mg total) by mouth daily.    Dispense:  30 tablet    Refill:  0    DO NOT FILL FOR 30 DAYS AFTER PRESCRIBED.  Marland Kitchen DISCONTD: amphetamine-dextroamphetamine (ADDERALL XR) 10 MG 24 hr capsule    Sig: Take 1-2 capsules (10-20 mg total) by mouth daily.    Dispense:  60 capsule    Refill:  0    DO NOT FILL FOR 30 DAYS AFTER PRESCRIBED.  Marland Kitchen amphetamine-dextroamphetamine (ADDERALL) 10 MG tablet    Sig: Take 1 tablet (10 mg total) by mouth daily.    Dispense:  30 tablet    Refill:  0    DO NOT FILL FOR 60 DAYS AFTER PRESCRIBED.  Marland Kitchen amphetamine-dextroamphetamine (ADDERALL XR) 10 MG 24 hr capsule    Sig: Take 1-2 capsules (10-20 mg total) by mouth daily.    Dispense:  60 capsule    Refill:  0    DO NOT FILL FOR 60 DAYS AFTER PRESCRIBED.    Return in about 6 months (around 09/06/2018) for follow-up chronic medical conditions.   Chrissa Meetze Paulita Fujita, M.D. Primary [facility] at [facility] previously [facility] [address] [place], [place]  [address] [phone] phone [phone] fax

## 2018-03-07 ENCOUNTER — Encounter: Payer: Self-pay | Admitting: Family Medicine

## 2018-03-07 ENCOUNTER — Other Ambulatory Visit: Payer: Self-pay | Admitting: Family Medicine

## 2018-03-07 LAB — COMPREHENSIVE METABOLIC PANEL
ALBUMIN: 4.7 g/dL (ref 3.5–5.5)
ALK PHOS: 95 IU/L (ref 39–117)
ALT: 13 IU/L (ref 0–32)
AST: 16 IU/L (ref 0–40)
Albumin/Globulin Ratio: 1.9 (ref 1.2–2.2)
BUN/Creatinine Ratio: 27 — ABNORMAL HIGH (ref 9–23)
BUN: 17 mg/dL (ref 6–20)
Bilirubin Total: 0.3 mg/dL (ref 0.0–1.2)
CHLORIDE: 100 mmol/L (ref 96–106)
CO2: 24 mmol/L (ref 20–29)
CREATININE: 0.64 mg/dL (ref 0.57–1.00)
Calcium: 9.7 mg/dL (ref 8.7–10.2)
GFR calc Af Amer: 141 mL/min/{1.73_m2} (ref >59)
GFR calc non Af Amer: 123 mL/min/{1.73_m2} (ref >59)
Globulin, Total: 2.5 g/dL (ref 1.5–4.5)
Glucose: 82 mg/dL (ref 65–99)
Potassium: 4 mmol/L (ref 3.5–5.2)
Sodium: 139 mmol/L (ref 134–144)
Total Protein: 7.2 g/dL (ref 6.0–8.5)

## 2018-03-07 LAB — TSH: TSH: 3.06 u[IU]/mL (ref 0.450–4.500)

## 2018-03-07 LAB — TESTOSTERONE: Testosterone: 35 ng/dL (ref 8–48)

## 2018-03-07 LAB — PROGESTERONE: Progesterone: <0.1 ng/mL

## 2018-03-07 LAB — T4, FREE: Free T4: 0.91 ng/dL (ref 0.82–1.77)

## 2018-03-07 NOTE — Telephone Encounter (Signed)
Copied from CRM [id number]. Topic: Quick Communication - Rx Refill/Question >> Mar 07, 2018  2:15 PM [name], Wanda B wrote: Pharmacist called b/c the medication is not available @ their pharmacy but is available @ the pharmacy mentioned below  Medication: amphetamine-dextroamphetamine (ADDERALL XR) 10 MG 24 hr capsule [[id number]][[id number]]   [facility] [address] [phone]

## 2018-03-08 ENCOUNTER — Ambulatory Visit: Payer: BLUE CROSS/BLUE SHIELD | Admitting: Family Medicine

## 2018-03-08 NOTE — Telephone Encounter (Signed)
Phone call to Medicaid, spoke with Inetta Fermoina. She states authorization is needed for quantity. In "submission for clarification field" pharmacy would have to input the number 10. Case for authorization is [id number].  Phone call to pharmacy. Spoke with CiscoChristina. She states she has made a note of this and will contact patient for follow up.

## 2018-03-08 NOTE — Telephone Encounter (Signed)
Pt called in to f/u - she is out of her medication - she states MCD requires PA. Please advise.  Call back [phone]

## 2018-03-08 NOTE — Telephone Encounter (Signed)
LOV 03/06/18 [facility] [address]

## 2018-03-10 ENCOUNTER — Telehealth: Payer: Self-pay

## 2018-03-10 NOTE — Telephone Encounter (Signed)
Received PA request from pharmacy for Adderall XR 10 mg [employer]Called insurance company - Adderall XR 10 mg did not need a PA and was already purchased by patient.

## 2018-03-13 ENCOUNTER — Ambulatory Visit: Payer: BLUE CROSS/BLUE SHIELD | Admitting: Family Medicine

## 2018-03-30 ENCOUNTER — Other Ambulatory Visit: Payer: Self-pay | Admitting: Family Medicine

## 2018-04-10 ENCOUNTER — Telehealth: Payer: Self-pay | Admitting: *Deleted

## 2018-04-18 NOTE — Telephone Encounter (Signed)
j

## 2018-05-01 ENCOUNTER — Encounter: Payer: Self-pay | Admitting: Family Medicine

## 2018-05-01 ENCOUNTER — Other Ambulatory Visit: Payer: Self-pay

## 2018-05-01 ENCOUNTER — Ambulatory Visit: Payer: Medicaid Other | Admitting: Family Medicine

## 2018-05-01 VITALS — BP 130/70 | HR 112 | Temp 98.0°F | Resp 16 | Ht 72.0 in | Wt 230.0 lb

## 2018-05-01 DIAGNOSIS — G4710 Hypersomnia, unspecified: Secondary | ICD-10-CM

## 2018-05-01 DIAGNOSIS — F419 Anxiety disorder, unspecified: Secondary | ICD-10-CM

## 2018-05-01 DIAGNOSIS — R635 Abnormal weight gain: Secondary | ICD-10-CM | POA: Diagnosis not present

## 2018-05-01 DIAGNOSIS — E282 Polycystic ovarian syndrome: Secondary | ICD-10-CM | POA: Diagnosis not present

## 2018-05-01 DIAGNOSIS — E6609 Other obesity due to excess calories: Secondary | ICD-10-CM

## 2018-05-01 DIAGNOSIS — L700 Acne vulgaris: Secondary | ICD-10-CM | POA: Diagnosis not present

## 2018-05-01 DIAGNOSIS — Z6831 Body mass index (BMI) 31.0-31.9, adult: Secondary | ICD-10-CM

## 2018-05-01 DIAGNOSIS — R5383 Other fatigue: Secondary | ICD-10-CM

## 2018-05-01 DIAGNOSIS — F329 Major depressive disorder, single episode, unspecified: Secondary | ICD-10-CM

## 2018-05-01 DIAGNOSIS — F900 Attention-deficit hyperactivity disorder, predominantly inattentive type: Secondary | ICD-10-CM

## 2018-05-01 DIAGNOSIS — F32A Depression, unspecified: Secondary | ICD-10-CM

## 2018-05-01 MED ORDER — AMPHETAMINE-DEXTROAMPHET ER 10 MG PO CP24: 10.0000 mg | ORAL_CAPSULE | Freq: Every day | ORAL | 0 refills | Status: DC

## 2018-05-01 MED ORDER — AMPHETAMINE-DEXTROAMPHETAMINE 10 MG PO TABS: 10.0000 mg | ORAL_TABLET | Freq: Every day | ORAL | 0 refills | Status: DC

## 2018-05-01 NOTE — Patient Instructions (Addendum)
  Add protein shakes for snacks or meals. Goal of 20 grams of protein per meal.  Recommend weight loss, exercise for 30-60 minutes five days per week; recommend 2000 kcal restriction per day with a minimum of 60 grams of protein per day.  Eat 3 meals per day. Do not skip meals. Consider having a protein shake as a meal replacement to aid with eliminating meal skipping. Look for products with <220 calories, <7 gm sugar, and 20-30 gm protein.  Eat breakfast within 2 hours of getting up.   Make  your plate non-starchy vegetables,  protein, and  carbohydrates at lunch and dinner.   Aim for at least 64 oz. of calorie-free beverages daily (water, Crystal Light, diet green tea, etc.). Eliminate any sugary beverages such as regular soda, sweet tea, or fruit juice.   Pay attention to hunger and fullness cues.  Stop eating once you feel satisfied; don't wait until you feel full, stuffed, or sick from eating.  Choose lean meats and low fat/fat free dairy products.  Choose foods high in fiber such as fruits, vegetables, and whole grains (brown rice, whole wheat pasta, whole wheat bread, etc.).  Limit foods with added sugar to <7 gm per serving.  Always eat in the kitchen/dining room.  Never eat in the bedroom or in front of the TV.    Collie Siad, MD IRMA Leretha Pol, MD Benny Lennert, PA-C    IF you received an x-ray today, you will receive an invoice from [facility]. Please contact [facility] at [phone] with questions or concerns regarding your invoice.   IF you received labwork today, you will receive an invoice from [place]. Please contact [facility] at [phone] with questions or concerns regarding your invoice.   Our billing staff will not be able to assist you with questions regarding bills from these companies.  You will be contacted with the lab results as soon as they are available. The fastest way to get your results is to activate your My Chart account.  Instructions are located on the last page of this paperwork. If you have not heard from [place] regarding the results in 2 weeks, please contact this office.

## 2018-05-01 NOTE — Progress Notes (Signed)
Subjective:    Patient ID: Wanda Torres, female    DOB: 04-12-1991, 27 y.o.   MRN: [id number]  05/01/2018  Chronic Conditions (2 month follow-up )    HPI This 27 y.o. female presents for two month follow-up of unintentional weight gain.  Got [email or web address]; was not eating enough calories; not eating enough protein; had to account for breastfeeding.  Has only eaten ice cream once.  Walking everyday for 22 minutes.  Going to do yoga training course; has moved body so much; has eaten so well.  Has gained weight.  So tired all of the time.  Husband is convinced thyroid pathology.   Weird dry spots on skin.   Zoloft is working well; other than tired; very tired.  Lack of energy.   Anxiety is good; well controlled.  Mood down slightly; yet does not feel well.  Now frustrated and angry. Working hard to lose weight.  Has great routine.  Eating 2300 kcal per day; goal is 2300-2400 kcal.  Was eating 2000 kcal.  Skips lunch sometimes.   Was at 2000 kcal; added extra because added protein. B: two eggs, water S: coffee L: skips or salad or sandwich.  Dark greens, purple, hard boil egg, avacodo.  Water. Dinner: meat, vegetable, rice.  Water.  Getting protein not sure; good days. Not hungry.  Gets hungry in middle of the night.  Tries not to eat anything.  Snacking rare; cashews. IUD after birth of daughter.   Sleeping eight hours a night; infant is sleeping through the night.  Taking naps with daughter every day.  Two naps per day. No snoring. Sleeping seven hours per night.  Sleeps all night.   Taking Adderall daily.  Still sleepy.   BP Readings from Last 3 Encounters:  05/01/18 130/70  03/06/18 108/70  11/25/17 116/72   Wt Readings from Last 3 Encounters:  05/01/18 230 lb (104.3 kg)  03/06/18 223 lb 4.8 oz (101.3 kg)  11/25/17 217 lb (98.4 kg)   Immunization History  Administered Date(s) Administered  . Influenza,inj,Quad PF,6+ Mos 08/26/2017  . Tdap 09/05/2010    Review  of Systems  Constitutional: Positive for unexpected weight change. Negative for activity change, appetite change, chills, diaphoresis, fatigue and fever.  HENT: Negative for congestion, dental problem, drooling, ear discharge, ear pain, facial swelling, hearing loss, mouth sores, nosebleeds, postnasal drip, rhinorrhea, sinus pressure, sneezing, sore throat, tinnitus, trouble swallowing and voice change.   Eyes: Negative for photophobia, pain, discharge, redness, itching and visual disturbance.  Respiratory: Negative for apnea, cough, choking, chest tightness, shortness of breath, wheezing and stridor.   Cardiovascular: Negative for chest pain, palpitations and leg swelling.  Gastrointestinal: Negative for abdominal distention, abdominal pain, anal bleeding, blood in stool, constipation, diarrhea, nausea, rectal pain and vomiting.  Endocrine: Negative for cold intolerance, heat intolerance, polydipsia, polyphagia and polyuria.  Genitourinary: Negative for decreased urine volume, difficulty urinating, dyspareunia, dysuria, enuresis, flank pain, frequency, genital sores, hematuria, menstrual problem, pelvic pain, urgency, vaginal bleeding, vaginal discharge and vaginal pain.  Musculoskeletal: Negative for arthralgias, back pain, gait problem, joint swelling, myalgias, neck pain and neck stiffness.  Skin: Negative for color change, pallor, rash and wound.  Allergic/Immunologic: Negative for environmental allergies, food allergies and immunocompromised state.  Neurological: Negative for dizziness, tremors, seizures, syncope, facial asymmetry, speech difficulty, weakness, light-headedness, numbness and headaches.  Hematological: Negative for adenopathy. Does not bruise/bleed easily.  Psychiatric/Behavioral: Positive for decreased concentration. Negative for agitation, behavioral problems, confusion, dysphoric mood, hallucinations, self-injury, sleep  disturbance and suicidal ideas. The patient is  nervous/anxious. The patient is not hyperactive.     Past Medical History:  Diagnosis Date  . Acne   . Allergy   . Anemia   . Anxiety   . Psoriasis   . Thyroid disease    History reviewed. No pertinent surgical history. No Known Allergies Current Outpatient Medications on File Prior to Visit  Medication Sig Dispense Refill  . chlorhexidine (PERIDEX) 0.12 % solution SWISH 1 TO 2 MINUTES AND SPIT QAM AND NIGHT AFTER BRUSHING  1  . PREVIDENT 5000 BOOSTER PLUS 1.1 % PSTE USE ONCE DAILY IN PLACE OF REGULAR TOOTHPASTE. DONT EAT OR DRINK ANYTHING FOR 30 MINUTES AFTER USING  3   No current facility-administered medications on file prior to visit.    Social History   Socioeconomic History  . Marital status: Single    Spouse name: Not on file  . Number of children: Not on file  . Years of education: Not on file  . Highest education level: Not on file  Occupational History    Comment: Supervisor reservations; 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[employer]  Social Needs  . Financial resource strain: Not on file  . Food insecurity:    Worry: Not on file    Inability: Not on file  . Transportation needs:    Medical: Not on file    Non-medical: Not on file  Tobacco Use  . Smoking status: Never Smoker  . Smokeless tobacco: Never Used  Substance and Sexual Activity  . Alcohol use: Not on file  . Drug use: Not on file  . Sexual activity: Not on file  Lifestyle  . Physical activity:    Days per week: Not on file    Minutes per session: Not on file  . Stress: Not on file  Relationships  . Social connections:    Talks on phone: Not on file    Gets together: Not on file    Attends religious service: Not on file    Active member of club or organization: Not on file    Attends meetings of clubs or organizations: Not on file    Relationship status: Not on file  . Intimate partner violence:    Fear of current or ex partner: Not on file    Emotionally abused: Not on file    Physically abused: Not  on file    Forced sexual activity: Not on file  Other Topics Concern  . Not on file  Social History Narrative  . Not on file   History reviewed. No pertinent family history.     Objective:    BP 130/70   Pulse (!) 112   Temp 98 F (36.7 C) (Oral)   Resp 16   Ht 6' (1.829 m)   Wt 230 lb (104.3 kg)   SpO2 99%   BMI 31.19 kg/m  Physical Exam  Constitutional: She is oriented to person, place, and time. She appears well-developed and well-nourished. No distress.  HENT:  Head: Normocephalic and atraumatic.  Right Ear: External ear normal.  Left Ear: External ear normal.  Nose: Nose normal.  Mouth/Throat: Oropharynx is clear and moist.  Eyes: Pupils are equal, round, and reactive to light. Conjunctivae and EOM are normal.  Neck: Normal range of motion. Neck supple. Carotid bruit is not present. No thyromegaly present.  Cardiovascular: Normal rate, regular rhythm, normal heart sounds and intact distal pulses. Exam reveals no gallop and no friction rub.  No murmur heard. Pulmonary/Chest:  Effort normal and breath sounds normal. She has no wheezes. She has no rales.  Abdominal: Soft. Bowel sounds are normal. She exhibits no distension and no mass. There is no tenderness. There is no rebound and no guarding.  Lymphadenopathy:    She has no cervical adenopathy.  Neurological: She is alert and oriented to person, place, and time. No cranial nerve deficit.  Skin: Skin is warm and dry. No rash noted. She is not diaphoretic. No erythema. No pallor.  Psychiatric: She has a normal mood and affect. Her behavior is normal.   No results found. Depression screen [facility] 2/9 05/01/2018 03/06/2018 11/25/2017 10/13/2017 08/26/2017  Decreased Interest 0 0 0 0 0  Down, Depressed, Hopeless 0 0 0 0 0  PHQ - 2 Score 0 0 0 0 0  Altered sleeping - - - - -  Tired, decreased energy - - - - -  Change in appetite - - - - -  Feeling bad or failure about yourself  - - - - -  Trouble concentrating - - - - -    Moving slowly or fidgety/restless - - - - -  Suicidal thoughts - - - - -  PHQ-9 Score - - - - -  Difficult doing work/chores - - - - -   Fall Risk  05/01/2018 03/06/2018 11/25/2017 10/13/2017 08/26/2017  Falls in the past year? No No No No No        Assessment & Plan:   1. Weight gain   2. PCOS (polycystic ovarian syndrome)   3. Acne vulgaris   4. Hypersomnolence   5. Other fatigue   6. Anxiety and depression   7. Attention deficit hyperactivity disorder (ADHD), predominantly inattentive type     Unintentional weight gain: Ongoing issue for patient despite attempts at exercise and weight loss.  Obtain labs to rule out secondary causes.  Recommend patient adding protein shakes for snacks or meals.  Recommend a goal of 20 g of protein per meal.  Patient is on incorporating enough protein throughout the day.  I also recommend moderate intensity exercise for 30 to 45 minutes daily.  Hypersomnolence with fatigue: New onset.  Obtain labs to rule out secondary causes.  No improvement with increase in exercise and is worsening with weight gain.  Anxiety and depression under good control at this time.  Anxiety and depression with attention deficit disorder: Moderately controlled at this time.  Coping well with stressors of newborn.  PCOS and acne vulgaris: Stable at this time.  Orders Placed This Encounter  Procedures  . CBC with Differential/Platelet  . Comprehensive metabolic panel  . VITAMIN D 25 Hydroxy (Vit-D Deficiency, Fractures)  . Vitamin B12  . TSH  . T4, free  . Iron   Meds ordered this encounter  Medications  . DISCONTD: amphetamine-dextroamphetamine (ADDERALL XR) 10 MG 24 hr capsule    Sig: Take 1-2 capsules (10-20 mg total) by mouth daily.    Dispense:  60 capsule    Refill:  0    DO NOT FILL FOR 60 DAYS AFTER PRESCRIBED.  Marland Kitchen DISCONTD: amphetamine-dextroamphetamine (ADDERALL) 10 MG tablet    Sig: Take 1 tablet (10 mg total) by mouth daily.    Dispense:  30 tablet     Refill:  0    DO NOT FILL FOR 60 DAYS AFTER PRESCRIBED.  Marland Kitchen DISCONTD: amphetamine-dextroamphetamine (ADDERALL XR) 10 MG 24 hr capsule    Sig: Take 1-2 capsules (10-20 mg total) by mouth daily.  Dispense:  60 capsule    Refill:  0    DO NOT FILL FOR 30 DAYS AFTER PRESCRIBED.  Marland Kitchen DISCONTD: amphetamine-dextroamphetamine (ADDERALL) 10 MG tablet    Sig: Take 1 tablet (10 mg total) by mouth daily.    Dispense:  30 tablet    Refill:  0    DO NOT FILL FOR 30 DAYS AFTER PRESCRIBED.  Marland Kitchen amphetamine-dextroamphetamine (ADDERALL XR) 10 MG 24 hr capsule    Sig: Take 1-2 capsules (10-20 mg total) by mouth daily.    Dispense:  60 capsule    Refill:  0  . amphetamine-dextroamphetamine (ADDERALL) 10 MG tablet    Sig: Take 1 tablet (10 mg total) by mouth daily.    Dispense:  30 tablet    Refill:  0  . sertraline (ZOLOFT) 50 MG tablet    Sig: Take 1 tablet (50 mg total) by mouth daily.    Dispense:  30 tablet    Refill:  5    Return in about 3 months (around 08/01/2018) for recheck.   Mcclellan Demarais Paulita Fujita, M.D. Primary Care at [facility] [address] [place], [place]  [address] [phone] phone [phone] fax

## 2018-05-02 ENCOUNTER — Telehealth: Payer: Self-pay

## 2018-05-02 LAB — CBC WITH DIFFERENTIAL/PLATELET
BASOS: 0 %
Basophils Absolute: 0 10*3/uL (ref 0.0–0.2)
EOS (ABSOLUTE): 0.1 10*3/uL (ref 0.0–0.4)
Eos: 2 %
HEMOGLOBIN: 14.4 g/dL (ref 11.1–15.9)
Hematocrit: 40.9 % (ref 34.0–46.6)
IMMATURE GRANS (ABS): 0 10*3/uL (ref 0.0–0.1)
IMMATURE GRANULOCYTES: 0 %
LYMPHS: 27 %
Lymphocytes Absolute: 2 10*3/uL (ref 0.7–3.1)
MCH: 32 pg (ref 26.6–33.0)
MCHC: 35.2 g/dL (ref 31.5–35.7)
MCV: 91 fL (ref 79–97)
Monocytes Absolute: 0.7 10*3/uL (ref 0.1–0.9)
Monocytes: 10 %
NEUTROS ABS: 4.5 10*3/uL (ref 1.4–7.0)
NEUTROS PCT: 61 %
PLATELETS: 219 10*3/uL (ref 150–379)
RBC: 4.5 x10E6/uL (ref 3.77–5.28)
RDW: 13 % (ref 12.3–15.4)
WBC: 7.4 10*3/uL (ref 3.4–10.8)

## 2018-05-02 LAB — COMPREHENSIVE METABOLIC PANEL
A/G RATIO: 1.8 (ref 1.2–2.2)
ALK PHOS: 80 IU/L (ref 39–117)
ALT: 16 IU/L (ref 0–32)
AST: 18 IU/L (ref 0–40)
Albumin: 4.4 g/dL (ref 3.5–5.5)
BUN/Creatinine Ratio: 22 (ref 9–23)
BUN: 15 mg/dL (ref 6–20)
Bilirubin Total: 0.3 mg/dL (ref 0.0–1.2)
CO2: 24 mmol/L (ref 20–29)
Calcium: 9.4 mg/dL (ref 8.7–10.2)
Chloride: 105 mmol/L (ref 96–106)
Creatinine, Ser: 0.69 mg/dL (ref 0.57–1.00)
GFR calc Af Amer: 138 mL/min/{1.73_m2} (ref >59)
GFR calc non Af Amer: 120 mL/min/{1.73_m2} (ref >59)
GLOBULIN, TOTAL: 2.4 g/dL (ref 1.5–4.5)
Glucose: 76 mg/dL (ref 65–99)
POTASSIUM: 4.4 mmol/L (ref 3.5–5.2)
SODIUM: 142 mmol/L (ref 134–144)
Total Protein: 6.8 g/dL (ref 6.0–8.5)

## 2018-05-02 LAB — VITAMIN B12: Vitamin B-12: 1361 pg/mL — ABNORMAL HIGH (ref 232–1245)

## 2018-05-02 LAB — IRON: Iron: 79 ug/dL (ref 27–159)

## 2018-05-02 LAB — T4, FREE: Free T4: 0.84 ng/dL (ref 0.82–1.77)

## 2018-05-02 LAB — VITAMIN D 25 HYDROXY (VIT D DEFICIENCY, FRACTURES): VIT D 25 HYDROXY: 37.1 ng/mL (ref 30.0–100.0)

## 2018-05-02 LAB — TSH: TSH: 2.21 u[IU]/mL (ref 0.450–4.500)

## 2018-05-02 NOTE — Telephone Encounter (Signed)
Prior auth done and med approved picked up by pt for adderall xr. Ref [id number]

## 2018-05-12 ENCOUNTER — Encounter: Payer: Self-pay | Admitting: Family Medicine

## 2018-05-17 ENCOUNTER — Encounter: Payer: Self-pay | Admitting: Family Medicine

## 2018-06-06 DIAGNOSIS — M25561 Pain in right knee: Secondary | ICD-10-CM | POA: Insufficient documentation

## 2018-07-16 MED ORDER — SERTRALINE HCL 50 MG PO TABS: 50.0000 mg | ORAL_TABLET | Freq: Every day | ORAL | 5 refills | Status: DC

## 2018-08-09 DIAGNOSIS — O09891 Supervision of other high risk pregnancies, first trimester: Secondary | ICD-10-CM | POA: Insufficient documentation

## 2018-08-09 DIAGNOSIS — O09291 Supervision of pregnancy with other poor reproductive or obstetric history, first trimester: Secondary | ICD-10-CM | POA: Insufficient documentation

## 2018-08-09 LAB — HEPATITIS C ANTIBODY: Hepatitis C Ab: NEGATIVE

## 2018-08-09 LAB — HIV ANTIBODY: HIV 1/2 Antibodies: NONREACTIVE

## 2019-01-03 DIAGNOSIS — F419 Anxiety disorder, unspecified: Secondary | ICD-10-CM | POA: Diagnosis present

## 2019-01-19 DIAGNOSIS — O09899 Supervision of other high risk pregnancies, unspecified trimester: Secondary | ICD-10-CM | POA: Insufficient documentation

## 2019-01-19 HISTORY — DX: Supervision of other high risk pregnancies, unspecified trimester: O09.899

## 2019-02-20 DIAGNOSIS — R768 Other specified abnormal immunological findings in serum: Secondary | ICD-10-CM | POA: Insufficient documentation

## 2019-11-09 ENCOUNTER — Emergency Department (HOSPITAL_COMMUNITY): Payer: Medicaid Other

## 2019-11-09 ENCOUNTER — Other Ambulatory Visit: Payer: Self-pay

## 2019-11-09 ENCOUNTER — Emergency Department (HOSPITAL_COMMUNITY)
Admission: EM | Admit: 2019-11-09 | Discharge: 2019-11-09 | Disposition: A | Discharge status: Home / Self Care | Payer: Medicaid Other | Attending: Emergency Medicine | Admitting: Emergency Medicine

## 2019-11-09 ENCOUNTER — Encounter (HOSPITAL_COMMUNITY): Payer: Self-pay

## 2019-11-09 DIAGNOSIS — Y998 Other external cause status: Secondary | ICD-10-CM | POA: Diagnosis not present

## 2019-11-09 DIAGNOSIS — M25571 Pain in right ankle and joints of right foot: Secondary | ICD-10-CM | POA: Insufficient documentation

## 2019-11-09 DIAGNOSIS — W010XXA Fall on same level from slipping, tripping and stumbling without subsequent striking against object, initial encounter: Secondary | ICD-10-CM | POA: Insufficient documentation

## 2019-11-09 DIAGNOSIS — M25561 Pain in right knee: Secondary | ICD-10-CM | POA: Diagnosis present

## 2019-11-09 DIAGNOSIS — Y92838 Other recreation area as the place of occurrence of the external cause: Secondary | ICD-10-CM | POA: Diagnosis not present

## 2019-11-09 DIAGNOSIS — W19XXXA Unspecified fall, initial encounter: Secondary | ICD-10-CM

## 2019-11-09 DIAGNOSIS — Z79899 Other long term (current) drug therapy: Secondary | ICD-10-CM | POA: Insufficient documentation

## 2019-11-09 DIAGNOSIS — Y9301 Activity, walking, marching and hiking: Secondary | ICD-10-CM | POA: Insufficient documentation

## 2019-11-09 MED ORDER — HYDROCODONE-ACETAMINOPHEN 5-325 MG PO TABS: 1.0000 | ORAL_TABLET | Freq: Two times a day (BID) | ORAL | 0 refills | Status: DC | PRN

## 2019-11-09 MED ORDER — ONDANSETRON 4 MG PO TBDP
4.0000 mg | ORAL_TABLET | Freq: Once | ORAL | Status: AC
  Administered 2019-11-09: 4 mg via ORAL
  Filled 2019-11-09: qty 1

## 2019-11-09 MED ORDER — ONDANSETRON HCL 4 MG PO TABS: 4.0000 mg | ORAL_TABLET | Freq: Four times a day (QID) | ORAL | 0 refills | Status: DC

## 2019-11-09 MED ORDER — METOCLOPRAMIDE HCL 10 MG PO TABS: 10.0000 mg | ORAL_TABLET | Freq: Four times a day (QID) | ORAL | 0 refills | Status: DC

## 2019-11-09 MED ORDER — HYDROMORPHONE HCL 1 MG/ML IJ SOLN
1.0000 mg | Freq: Once | INTRAMUSCULAR | Status: AC
  Administered 2019-11-09: 1 mg via INTRAMUSCULAR
  Filled 2019-11-09: qty 1

## 2019-11-09 MED ORDER — METOCLOPRAMIDE HCL 5 MG/ML IJ SOLN
5.0000 mg | Freq: Once | INTRAMUSCULAR | Status: AC
  Administered 2019-11-09: 21:00:00 5 mg via INTRAMUSCULAR
  Filled 2019-11-09: qty 2

## 2019-11-09 MED ORDER — METOCLOPRAMIDE HCL 10 MG PO TABS
5.0000 mg | ORAL_TABLET | Freq: Once | ORAL | Status: DC
  Filled 2019-11-09: qty 1

## 2019-11-09 NOTE — ED Notes (Signed)
Patient transported to X-ray 

## 2019-11-09 NOTE — ED Provider Notes (Signed)
[facility] DEPT Provider Note   CSN: [id number] Arrival date & time: 11/09/19  1800     History   Chief Complaint Chief Complaint  Patient presents with   Leg Injury    HPI Wanda Torres is a 28 y.o. female.     HPI   28 year old female with a history of acne, allergies, anemia, anxiety, psoriasis, thyroid disease, who presents the emergency department today complaining of right lower extremity pain after mechanical fall prior to arrival.  States she was walking through the mud when she bent her right leg sideways and fell.  Since then she has had significant pain that has been constant in the leg.  Worse with weightbearing or any movement.  She denies any other injuries including no head trauma or LOC.  No pain elsewhere.  Past Medical History:  Diagnosis Date   Acne    Allergy    Anemia    Anxiety    Psoriasis    Thyroid disease     Patient Active Problem List   Diagnosis Date Noted   Anxiety and depression 10/17/2016   Attention deficit hyperactivity disorder (ADHD), predominantly inattentive type 10/17/2016   PCOS (polycystic ovarian syndrome) 03/26/2016   Acne 02/19/2016    History reviewed. No pertinent surgical history.   OB History    Gravida  1   Para      Term      Preterm      AB      Living        SAB      TAB      Ectopic      Multiple      Live Births               Home Medications    Prior to Admission medications   Medication Sig Start Date End Date Taking? Authorizing Provider  amphetamine-dextroamphetamine (ADDERALL XR) 10 MG 24 hr capsule Take 1-2 capsules (10-20 mg total) by mouth daily. 05/01/18   Wardell Honour, MD  amphetamine-dextroamphetamine (ADDERALL) 10 MG tablet Take 1 tablet (10 mg total) by mouth daily. 05/01/18   [facility]ell Honour, MD  chlorhexidine (PERIDEX) 0.12 % solution SWISH 1 TO 2 MINUTES AND SPIT QAM AND NIGHT AFTER BRUSHING 03/21/18   [provider]  HYDROcodone-acetaminophen (NORCO/VICODIN) 5-325 MG tablet Take 1 tablet by mouth every 12 (twelve) hours as needed. 11/09/19   Blondine Hottel S, PA-C  PREVIDENT 5000 BOOSTER PLUS 1.1 % PSTE USE ONCE DAILY IN PLACE OF REGULAR TOOTHPASTE. DONT EAT OR DRINK ANYTHING FOR 30 MINUTES AFTER USING 03/21/18   [provider]  sertraline (ZOLOFT) 50 MG tablet Take 1 tablet (50 mg total) by mouth daily. 07/16/18   W[facility]ell Honour, MD    Family History History reviewed. No pertinent family history.  Social History Social History   Tobacco Use   Smoking status: Never Smoker   Smokeless tobacco: Never Used  Substance Use Topics   Alcohol use: Yes    Alcohol/week: 0.0 standard drinks   Drug use: Never     Allergies   Patient has no known allergies.   Review of Systems Review of Systems  Constitutional: Negative for fever.  Musculoskeletal:       RLE pain  Neurological:       No head trauma or loc     Physical Exam Updated Vital Signs BP 123/77 (BP Location: Left Arm)    Pulse (!) 111  Temp 98.8 F (37.1 C) (Oral)    Resp 18    Ht 6' (1.829 m)    Wt 115.7 kg    SpO2 98%    BMI 34.58 kg/m   Physical Exam Vitals signs and nursing note reviewed.  Constitutional:      General: She is not in acute distress.    Appearance: She is well-developed.  HENT:     Head: Normocephalic and atraumatic.  Eyes:     Conjunctiva/sclera: Conjunctivae normal.  Neck:     Musculoskeletal: Neck supple.  Cardiovascular:     Rate and Rhythm: Normal rate.  Pulmonary:     Effort: Pulmonary effort is normal.  Musculoskeletal: Normal range of motion.     Comments: TTP to the right knee, proximal tib/fib, distal fib and medial/lateral malleolus. Mild joint laxity with valgus stress. No significant laxity with varus stress. Exam somewhat limited 2/2 pain, pt unable to tolerate ACL/PCL testing.  DP pulse intact. Sensation intact. Brisk cap refill.   Skin:    General: Skin is warm and dry.    Neurological:     Mental Status: She is alert.      ED Treatments / Results  Labs (all labs ordered are listed, but only abnormal results are displayed) Labs Reviewed - No data to display  EKG None  Radiology Dg Tibia/fibula Right  Result Date: 11/09/2019 CLINICAL DATA:  Recent slip and fall with right leg pain, initial encounter EXAM: RIGHT TIBIA AND FIBULA - 2 VIEW COMPARISON:  None. FINDINGS: Well corticated bony density is noted adjacent to distal fibula consistent with prior fracture and nonunion. No acute fracture is seen. No soft tissue abnormality is noted. IMPRESSION: Changes consistent with prior distal fibular fracture. No acute abnormality noted. Electronically Signed   By: Alcide CleverMark  Lukens M.D.   On: 11/09/2019 19:11   Dg Ankle Complete Right  Result Date: 11/09/2019 CLINICAL DATA:  Recent slip and fall with right ankle pain, initial encounter EXAM: RIGHT ANKLE - COMPLETE 3+ VIEW COMPARISON:  None. FINDINGS: Well corticated bony density is noted adjacent to the distal fibula consistent with prior trauma and nonunion. Some mild widening of the medial aspect of the tibiotalar space is noted which may be projectional in nature as it is only seen on 1 image. No significant soft tissue abnormality is seen. No acute fracture is noted. 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 No acute abnormality is noted. Electronically Signed   By: Alcide CleverMark  Lukens M.D.   On: 11/09/2019 19:13   Dg Knee Complete 4 Views Right  Result Date: 11/09/2019 CLINICAL DATA:  Recent slip and fall with knee pain, initial encounter EXAM: RIGHT KNEE - COMPLETE 4+ VIEW COMPARISON:  None. FINDINGS: No evidence of fracture, dislocation, or joint effusion. No evidence of arthropathy or other focal bone abnormality. Soft tissues are unremarkable. IMPRESSION: No acute abnormality noted. Electronically Signed   By: Alcide CleverMark  Lukens M.D.   On: 11/09/2019 19:09    Procedures Procedures (including  critical care time)  Medications Ordered in ED Medications  HYDROmorphone (DILAUDID) injection 1 mg (1 mg Intramuscular Given 11/09/19 1915)  ondansetron (ZOFRAN-ODT) disintegrating tablet 4 mg (4 mg Oral Given 11/09/19 2012)  HYDROmorphone (DILAUDID) injection 1 mg (1 mg Intramuscular Given 11/09/19 2013)     Initial Impression / Assessment and Plan / ED Course  I have reviewed the triage vital signs and the nursing notes.  Pertinent labs & imaging results that were available during my care of the patient  were reviewed by me and considered in my medical decision making (see chart for details).     Final Clinical Impressions(s) / ED Diagnoses   Final diagnoses:  Fall, initial encounter  Acute pain of right knee  Acute right ankle pain   28 year old female presenting for evaluation of right lower extremity pain following fall prior to arrival.  Does have tenderness throughout the knee and to the right lateral malleolus and distal fib with associated swelling.  Low suspicion for arterial injury with a normal sensation and neurovascular status.  X-ray right knee negative for acute abnormality X-ray right tip/fib with changes consistent with prior distal fibular fracture without acute abnormality X-ray right ankle with changes consistent with prior trauma and nonunion without acute abnormality.  8:30 PM Discussed case with Dr. Magnus Ivan with orthopedics who recommends ASO ankle splint and a knee immobilizer. Pt can weight bear as tolerated.   Patient given pain medications and Ortho follow-up.  Advised on return precautions.  She voiced understand the plan and reasons to return.  All questions.   ED Discharge Orders         Ordered    HYDROcodone-acetaminophen (NORCO/VICODIN) 5-325 MG tablet  Every 12 hours PRN     11/09/19 2106           Rayne Du 11/09/19 2108    Tegeler, Canary Brim, MD 11/10/19 [id number]

## 2019-11-09 NOTE — Discharge Instructions (Addendum)
You may rotate tylenol and motrin for pain. If you have breakthrough pain you may take norco as discussed while you were in the ED.   Prescription given for Norco. Take medication as directed and do not operate machinery, drive a car, or work while taking this medication as it can make you drowsy.   Please follow up with orthopedics within 5-7 days for re-evaluation of your symptoms. Please return to the emergency department for any new or worsening symptoms.

## 2019-11-09 NOTE — ED Triage Notes (Signed)
Pt arrives POV from home with complaints of right knee pain. Pt reports a slip and fall. Pt reports when she fell she fell with her rt leg landing in an unnatural position. Pulse present in rt foot with use of doppler. Pt reports decreased sensation

## 2019-12-06 DIAGNOSIS — S83411A Sprain of medial collateral ligament of right knee, initial encounter: Secondary | ICD-10-CM | POA: Insufficient documentation

## 2019-12-06 DIAGNOSIS — S83511A Sprain of anterior cruciate ligament of right knee, initial encounter: Secondary | ICD-10-CM | POA: Insufficient documentation

## 2020-01-02 DIAGNOSIS — Z9889 Other specified postprocedural states: Secondary | ICD-10-CM | POA: Insufficient documentation

## 2020-03-20 ENCOUNTER — Ambulatory Visit: Payer: Medicaid Other | Attending: Internal Medicine

## 2020-03-20 DIAGNOSIS — Z23 Encounter for immunization: Secondary | ICD-10-CM

## 2020-03-20 NOTE — Progress Notes (Signed)
   Covid-19 Vaccination Clinic  Name:  Wanda Torres    MRN: [id number] DOB: [birth year]  03/20/2020  [name] was observed post Covid-19 immunization for 15 minutes without incident. She was provided with Vaccine Information Sheet and instruction to access the V-Safe system.   [name] was instructed to call 911 with any severe reactions post vaccine: Marland Kitchen Difficulty breathing  . Swelling of face and throat  . A fast heartbeat  . A bad rash all over body  . Dizziness and weakness   Immunizations Administered    Name Date Dose VIS Date Route   Pfizer COVID-19 Vaccine 03/20/2020  4:00 PM 0.3 mL 12/07/2019 Intramuscular   Manufacturer: [employer], [employer]   Lot: [id number]   NDC: [address]-4

## 2020-04-14 ENCOUNTER — Ambulatory Visit: Payer: Medicaid Other | Attending: Internal Medicine

## 2020-04-14 DIAGNOSIS — Z23 Encounter for immunization: Secondary | ICD-10-CM

## 2020-04-14 NOTE — Progress Notes (Signed)
   Covid-19 Vaccination Clinic  Name:  SAMAIRA HOLZWORTH    MRN: [id number] DOB: [birth year]  04/14/2020  Ms. Goldenstein was observed post Covid-19 immunization for 15 minutes without incident. She was provided with Vaccine Information Sheet and instruction to access the V-Safe system.   Ms. Twist was instructed to call 911 with any severe reactions post vaccine: Marland Kitchen Difficulty breathing  . Swelling of face and throat  . A fast heartbeat  . A bad rash all over body  . Dizziness and weakness   Immunizations Administered    Name Date Dose VIS Date Route   Pfizer COVID-19 Vaccine 04/14/2020  4:41 PM 0.3 mL 02/20/2019 Intramuscular   Manufacturer: [employer], [employer]   Lot: [id number]   NDC: [address]-8

## 2020-07-09 ENCOUNTER — Inpatient Hospital Stay (HOSPITAL_COMMUNITY)
Admission: RE | Admit: 2020-07-09 | Discharge: 2020-07-14 | DRG: 885 | Disposition: A | Discharge status: Home / Self Care | Payer: BC Managed Care – PPO | Attending: Psychiatry | Admitting: Psychiatry

## 2020-07-09 ENCOUNTER — Encounter (HOSPITAL_COMMUNITY): Payer: Self-pay | Admitting: Psychiatry

## 2020-07-09 ENCOUNTER — Other Ambulatory Visit: Payer: Self-pay

## 2020-07-09 DIAGNOSIS — F329 Major depressive disorder, single episode, unspecified: Secondary | ICD-10-CM | POA: Diagnosis not present

## 2020-07-09 DIAGNOSIS — F332 Major depressive disorder, recurrent severe without psychotic features: Principal | ICD-10-CM

## 2020-07-09 DIAGNOSIS — Z79899 Other long term (current) drug therapy: Secondary | ICD-10-CM

## 2020-07-09 DIAGNOSIS — F17200 Nicotine dependence, unspecified, uncomplicated: Secondary | ICD-10-CM | POA: Diagnosis present

## 2020-07-09 DIAGNOSIS — F32A Depression, unspecified: Secondary | ICD-10-CM | POA: Diagnosis present

## 2020-07-09 DIAGNOSIS — R451 Restlessness and agitation: Secondary | ICD-10-CM | POA: Diagnosis present

## 2020-07-09 DIAGNOSIS — X781XXA Intentional self-harm by knife, initial encounter: Secondary | ICD-10-CM | POA: Diagnosis present

## 2020-07-09 DIAGNOSIS — R45851 Suicidal ideations: Secondary | ICD-10-CM | POA: Diagnosis present

## 2020-07-09 DIAGNOSIS — G4700 Insomnia, unspecified: Secondary | ICD-10-CM | POA: Diagnosis present

## 2020-07-09 DIAGNOSIS — Z20822 Contact with and (suspected) exposure to covid-19: Secondary | ICD-10-CM | POA: Diagnosis present

## 2020-07-09 DIAGNOSIS — F419 Anxiety disorder, unspecified: Secondary | ICD-10-CM | POA: Diagnosis present

## 2020-07-09 LAB — HEPATIC FUNCTION PANEL
ALT: 49 U/L — ABNORMAL HIGH (ref 0–44)
AST: 49 U/L — ABNORMAL HIGH (ref 15–41)
Albumin: 4.2 g/dL (ref 3.5–5.0)
Alkaline Phosphatase: 72 U/L (ref 38–126)
Bilirubin, Direct: 0.1 mg/dL (ref 0.0–0.2)
Indirect Bilirubin: 0.6 mg/dL (ref 0.3–0.9)
Total Bilirubin: 0.7 mg/dL (ref 0.3–1.2)
Total Protein: 7.1 g/dL (ref 6.5–8.1)

## 2020-07-09 LAB — HEMOGLOBIN A1C
Hgb A1c MFr Bld: 4.4 % — ABNORMAL LOW (ref 4.8–5.6)
Mean Plasma Glucose: 79.58 mg/dL

## 2020-07-09 LAB — LIPID PANEL
Cholesterol: 131 mg/dL (ref 0–200)
HDL: 59 mg/dL (ref >40)
LDL Cholesterol: 43 mg/dL (ref 0–99)
Total CHOL/HDL Ratio: 2.2 RATIO
Triglycerides: 143 mg/dL (ref <150)
VLDL: 29 mg/dL (ref 0–40)

## 2020-07-09 LAB — COMPREHENSIVE METABOLIC PANEL
ALT: 49 U/L — ABNORMAL HIGH (ref 0–44)
AST: 50 U/L — ABNORMAL HIGH (ref 15–41)
Albumin: 4.2 g/dL (ref 3.5–5.0)
Alkaline Phosphatase: 73 U/L (ref 38–126)
Anion gap: 10 (ref 5–15)
BUN: 18 mg/dL (ref 6–20)
CO2: 26 mmol/L (ref 22–32)
Calcium: 8.9 mg/dL (ref 8.9–10.3)
Chloride: 101 mmol/L (ref 98–111)
Creatinine, Ser: 0.78 mg/dL (ref 0.44–1.00)
GFR calc Af Amer: >60 mL/min (ref >60)
GFR calc non Af Amer: >60 mL/min (ref >60)
Glucose, Bld: 103 mg/dL — ABNORMAL HIGH (ref 70–99)
Potassium: 3.9 mmol/L (ref 3.5–5.1)
Sodium: 137 mmol/L (ref 135–145)
Total Bilirubin: 0.7 mg/dL (ref 0.3–1.2)
Total Protein: 7.2 g/dL (ref 6.5–8.1)

## 2020-07-09 LAB — TSH: TSH: 2.264 u[IU]/mL (ref 0.350–4.500)

## 2020-07-09 LAB — SARS CORONAVIRUS 2 BY RT PCR (HOSPITAL ORDER, PERFORMED IN ~~LOC~~ HOSPITAL LAB): SARS Coronavirus 2: NEGATIVE

## 2020-07-09 LAB — MAGNESIUM: Magnesium: 2.3 mg/dL (ref 1.7–2.4)

## 2020-07-09 LAB — ETHANOL: Alcohol, Ethyl (B): <10 mg/dL (ref <10)

## 2020-07-09 MED ORDER — DULOXETINE HCL 30 MG PO CPEP
30.0000 mg | ORAL_CAPSULE | Freq: Every day | ORAL | Status: DC
  Administered 2020-07-09 – 2020-07-14 (×6): 30 mg via ORAL
  Filled 2020-07-09 (×9): qty 1

## 2020-07-09 MED ORDER — ALUM & MAG HYDROXIDE-SIMETH 200-200-20 MG/5ML PO SUSP: 30.0000 mL | ORAL | Status: DC | PRN

## 2020-07-09 MED ORDER — NICOTINE 21 MG/24HR TD PT24
21.0000 mg | MEDICATED_PATCH | Freq: Every day | TRANSDERMAL | Status: DC
  Administered 2020-07-09 – 2020-07-14 (×6): 21 mg via TRANSDERMAL
  Filled 2020-07-09 (×7): qty 1

## 2020-07-09 MED ORDER — ACETAMINOPHEN 325 MG PO TABS
650.0000 mg | ORAL_TABLET | Freq: Four times a day (QID) | ORAL | Status: DC | PRN
  Administered 2020-07-09 – 2020-07-13 (×7): 650 mg via ORAL
  Filled 2020-07-09 (×8): qty 2

## 2020-07-09 MED ORDER — HYDROXYZINE HCL 25 MG PO TABS
25.0000 mg | ORAL_TABLET | ORAL | Status: AC
  Administered 2020-07-09: 25 mg via ORAL

## 2020-07-09 MED ORDER — HYDROXYZINE HCL 25 MG PO TABS
25.0000 mg | ORAL_TABLET | Freq: Three times a day (TID) | ORAL | Status: DC | PRN
  Administered 2020-07-09 – 2020-07-14 (×16): 25 mg via ORAL
  Filled 2020-07-09 (×17): qty 1

## 2020-07-09 MED ORDER — MAGNESIUM HYDROXIDE 400 MG/5ML PO SUSP: 30.0000 mL | Freq: Every day | ORAL | Status: DC | PRN

## 2020-07-09 MED ORDER — TRAZODONE HCL 50 MG PO TABS
50.0000 mg | ORAL_TABLET | Freq: Every evening | ORAL | Status: DC | PRN
  Administered 2020-07-10: 50 mg via ORAL
  Filled 2020-07-09: qty 1

## 2020-07-09 MED ORDER — BACITRACIN-NEOMYCIN-POLYMYXIN 400-5-5000 EX OINT
TOPICAL_OINTMENT | Freq: Three times a day (TID) | CUTANEOUS | Status: DC
  Administered 2020-07-10 – 2020-07-11 (×2): 1 via TOPICAL
  Filled 2020-07-09 (×18): qty 1

## 2020-07-09 NOTE — H&P (Signed)
Psychiatric Admission Assessment Adult  Patient Identification: Wanda Torres MRN:  [id number] Date of Evaluation:  07/09/2020 Chief Complaint:  Encounter for psychological evaluation [Z00.8] Principal Diagnosis: Anxiety and depression Diagnosis:  Principal Problem:   Anxiety and depression  History of Present Illness: Patient is seen and examined.  Patient is a 29 year old female with a past psychiatric history significant for depression and anxiety who presented [facility] the [facility] with a friend secondary to worsening depression over the last 4 to 6 weeks.  The patient stated that she had been treated with Zoloft as well as Vyvanse.  She decided to go off medications approximately 4 to 6 weeks ago.  Since then she has had worsening depression, and feeling overwhelmed.  She began to have thoughts of self-harm, and cut her left ankle.  She felt so overwhelmed today that she considered hiring babysitter, and been taking aspirin and drinking excessively hoping to die.  She stated she had been treated for depression since approximately age 29.  That is when she is essentially started on Zoloft.  Over the years she is done well with this, and the dosage has been increased to approximately 150 mg p.o. daily.  Towards the end of taking the Zoloft she does not feel as though it was making things much better, but things have certainly gotten much worse.  She has 3 children and does have a significant other, but the significant other recently told her that they were transitioning to female.  This again has caused a great deal of emotional distress and her.  She admitted to helplessness, hopelessness and worthlessness.  She admitted to suicidal ideation this morning.  The decision was made to admit her to the hospital for evaluation and stabilization.  Associated Signs/Symptoms: Depression Symptoms:  depressed mood, anhedonia, insomnia, psychomotor retardation, fatigue, feelings  of worthlessness/guilt, difficulty concentrating, hopelessness, suicidal thoughts without plan, anxiety, disturbed sleep, (Hypo) Manic Symptoms:  Impulsivity, Anxiety Symptoms:  Excessive Worry, Psychotic Symptoms:  denied PTSD Symptoms: Had a traumatic exposure:  In the past Total Time spent with patient: 45 minutes  Past Psychiatric History: Patient had 1 previous psychiatric admission is now less than.  She has been on Zoloft since then.  It has been titrated to 150 mg p.o. daily over the years.  She has been on Adderall extended release but it led to side effects for her attention deficit disorder.  She stated she does better on Vyvanse and it does not disrupt her sleep.  She stopped taking both these medications 4 to 6 weeks ago.  Is the patient at risk to self? Yes.    Has the patient been a risk to self in the past 6 months? No.  Has the patient been a risk to self within the distant past? No.  Is the patient a risk to others? No.  Has the patient been a risk to others in the past 6 months? No.  Has the patient been a risk to others within the distant past? No.   Prior Inpatient Therapy:   Prior Outpatient Therapy:    Alcohol Screening: 1. How often do you have a drink containing alcohol?: Monthly or less 2. How many drinks containing alcohol do you have on a typical day when you are drinking?: 3 or 4 3. How often do you have six or more drinks on one occasion?: Less than monthly AUDIT-C Score: 3 4. How often during the last year have you found that you were not  able to stop drinking once you had started?: Never 5. How often during the last year have you failed to do what was normally expected from you because of drinking?: Never 6. How often during the last year have you needed a first drink in the morning to get yourself going after a heavy drinking session?: Never 7. How often during the last year have you had a feeling of guilt of remorse after drinking?: Never 8. How  often during the last year have you been unable to remember what happened the night before because you had been drinking?: Never 9. Have you or someone else been injured as a result of your drinking?: No 10. Has a relative or friend or a doctor or another health worker been concerned about your drinking or suggested you cut down?: No Alcohol Use Disorder Identification Test Final Score (AUDIT): 3 Alcohol Brief Interventions/Follow-up: AUDIT Score <7 follow-up not indicated Substance Abuse History in the last 12 months:  No. Consequences of Substance Abuse: Negative Previous Psychotropic Medications: Yes  Psychological Evaluations: Yes  Past Medical History:  Past Medical History:  Diagnosis Date  . Acne   . Allergy   . Anemia   . Anxiety   . Psoriasis   . Thyroid disease    History reviewed. No pertinent surgical history. Family History: History reviewed. No pertinent family history. Family Psychiatric  History: She has a family history of an aunt who had a suicide attempt.  She stated she had several members of her family with depression and anxiety. Tobacco Screening: Have you used any form of tobacco in the last 30 days? (Cigarettes, Smokeless Tobacco, Cigars, and/or Pipes): Yes Tobacco use, Select all that apply: smokeless tobacco use, not daily Are you interested in Tobacco Cessation Medications?: Yes, will notify MD for an order Counseled patient on smoking cessation including recognizing danger situations, developing coping skills and basic information about quitting provided: Yes Social History:  Social History   Substance and Sexual Activity  Alcohol Use Yes  . Alcohol/week: 0.0 standard drinks     Social History   Substance and Sexual Activity  Drug Use Never    Additional Social History:      Pain Medications: See MAR Prescriptions: See MAR Over the Counter: See MAR Withdrawal Symptoms: Nausea / Vomiting                    Allergies:  No Known  Allergies Lab Results:  Results for orders placed or performed during the hospital encounter of 07/09/20 (from the past 48 hour(s))  SARS Coronavirus 2 by RT PCR (hospital order, performed in [facility] hospital lab) Nasopharyngeal Nasopharyngeal Swab     Status: None   Collection Time: 07/09/20  3:07 AM   Specimen: Nasopharyngeal Swab  Result Value Ref Range   SARS Coronavirus 2 NEGATIVE NEGATIVE    Comment: (NOTE) SARS-CoV-2 target nucleic acids are NOT DETECTED.  The SARS-CoV-2 RNA is generally detectable in upper and lower respiratory specimens during the acute phase of infection. The lowest concentration of SARS-CoV-2 viral copies this assay can detect is 250 copies / mL. A negative result does not preclude SARS-CoV-2 infection and should not be used as the sole basis for treatment or other patient management decisions.  A negative result may occur with improper specimen collection / handling, submission of specimen other than nasopharyngeal swab, presence of viral mutation(s) within the areas targeted by this assay, and inadequate number of viral copies (<250 copies / mL). A  negative result must be combined with clinical observations, patient history, and epidemiological information.  Fact Sheet for Patients:   BoilerBrush.com.cy  Fact Sheet for Healthcare Providers: https://pope.com/  This test is not yet approved or  cleared by the [place] FDA and has been authorized for detection and/or diagnosis of SARS-CoV-2 by FDA under an Emergency Use Authorization (EUA).  This EUA will remain in effect (meaning this test can be used) for the duration of the COVID-19 declaration under Section 564(b)(1) of the Act, 21 U.S.C. section 360bbb-3(b)(1), unless the authorization is terminated or revoked sooner.  Performed at [facility], [address] [place], [place] [address]     Blood Alcohol level:  Lab  Results  Component Value Date   [facility]  08/11/2007    <5        LOWEST DETECTABLE LIMIT FOR SERUM ALCOHOL IS 11 mg/dL FOR MEDICAL PURPOSES ONLY    Metabolic Disorder Labs:  No results found for: HGBA1C, MPG No results found for: PROLACTIN No results found for: CHOL, TRIG, HDL, CHOLHDL, VLDL, LDLCALC  Current Medications: Current Facility-Administered Medications  Medication Dose Route Frequency Provider Last Rate Last Admin  . acetaminophen (TYLENOL) tablet 650 mg  650 mg Oral Q6H PRN Anike, Adaku C, NP   650 mg at 07/09/20 0333  . alum & mag hydroxide-simeth (MAALOX/MYLANTA) 200-200-20 MG/5ML suspension 30 mL  30 mL Oral Q4H PRN Anike, Adaku C, NP      . DULoxetine (CYMBALTA) DR capsule 30 mg  30 mg Oral Daily Antonieta Pert, MD   30 mg at 07/09/20 1013  . hydrOXYzine (ATARAX/VISTARIL) tablet 25 mg  25 mg Oral TID PRN Anike, Adaku C, NP   25 mg at 07/09/20 0333  . magnesium hydroxide (MILK OF MAGNESIA) suspension 30 mL  30 mL Oral Daily PRN Anike, Adaku C, NP      . neomycin-bacitracin-polymyxin (NEOSPORIN) ointment packet   Topical TID Antonieta Pert, MD   Given at 07/09/20 1151  . nicotine (NICODERM CQ - dosed in mg/24 hours) patch 21 mg  21 mg Transdermal Daily Antonieta Pert, MD   21 mg at 07/09/20 1152  . traZODone (DESYREL) tablet 50 mg  50 mg Oral QHS PRN Anike, Adaku C, NP       PTA Medications: Medications Prior to Admission  Medication Sig Dispense Refill Last Dose  . levonorgestrel (MIRENA) 20 MCG/24HR IUD 1 Intra Uterine Device by Intrauterine route continuous.   04/16/2019  . sertraline (ZOLOFT) 100 MG tablet Take 100 mg by mouth daily.   06/17/2020 at Unknown time  . amphetamine-dextroamphetamine (ADDERALL XR) 10 MG 24 hr capsule Take 1-2 capsules (10-20 mg total) by mouth daily. (Patient not taking: Reported on 07/09/2020) 60 capsule 0 Not Taking at Unknown time  . amphetamine-dextroamphetamine (ADDERALL) 10 MG tablet Take 1 tablet (10 mg total) by mouth daily.  (Patient not taking: Reported on 07/09/2020) 30 tablet 0 Not Taking at Unknown time  . HYDROcodone-acetaminophen (NORCO/VICODIN) 5-325 MG tablet Take 1 tablet by mouth every 12 (twelve) hours as needed. (Patient not taking: Reported on 07/09/2020) 8 tablet 0 Not Taking at Unknown time  . metoCLOPramide (REGLAN) 10 MG tablet Take 1 tablet (10 mg total) by mouth every 6 (six) hours for 5 days. (Patient not taking: Reported on 07/09/2020) 20 tablet 0 Not Taking  . ondansetron (ZOFRAN) 4 MG tablet Take 1 tablet (4 mg total) by mouth every 6 (six) hours. (Patient not taking: Reported on 07/09/2020) 12  tablet 0 Not Taking at Unknown time    Musculoskeletal: Strength & Muscle Tone: within normal limits Gait & Station: normal Patient leans: N/A  Psychiatric Specialty Exam: Physical Exam Vitals and nursing note reviewed.  HENT:     Head: Normocephalic and atraumatic.  Pulmonary:     Effort: Pulmonary effort is normal.  Neurological:     General: No focal deficit present.     Mental Status: She is alert and oriented to person, place, and time.     Review of Systems  Blood pressure 125/70, pulse (!) 107, temperature 98 F (36.7 C), temperature source Oral, resp. rate 18, height 6' (1.829 m), weight 125.6 kg, SpO2 100 %, unknown if currently breastfeeding.Body mass index is 37.57 kg/m.  General Appearance: Disheveled  Eye Contact:  Minimal  Speech:  Normal Rate  Volume:  Decreased  Mood:  Depressed  Affect:  Congruent  Thought Process:  Coherent and Descriptions of Associations: Intact  Orientation:  Full (Time, Place, and Person)  Thought Content:  Logical  Suicidal Thoughts:  Yes.  without intent/plan  Homicidal Thoughts:  No  Memory:  Immediate;   Fair Recent;   Fair Remote;   Fair  Judgement:  Intact  Insight:  Fair  Psychomotor Activity:  Psychomotor Retardation  Concentration:  Concentration: Fair and Attention Span: Fair  Recall:  [employer] of Knowledge:  Good  Language:  Good   Akathisia:  Negative  Handed:  Right  AIMS (if indicated):     Assets:  Desire for Improvement Housing Resilience Talents/Skills Transportation Vocational/Educational  ADL's:  Intact  Cognition:  WNL  Sleep:  Number of Hours: 0.5    Treatment Plan Summary: Daily contact with patient to assess and evaluate symptoms and progress in treatment, Medication management and Plan : Patient is seen and examined.  Patient is a 29 year old female with the above-stated past psychiatric history who was admitted secondary to worsening depression and suicidal ideation.  She will be admitted to the hospital.  She will be integrated in the milieu.  She will be encouraged to attend groups.  We discussed the option of either going back on Zoloft or switching medications.  She is unclear on whether or not the Zoloft was helpful.  We will go on and switch to Cymbalta.  We will start 30 mg p.o. daily and titrate that during the course of the hospitalization.  She does have a history of psoriasis, but is no longer taking methotrexate or folic acid.  She will also have available hydroxyzine for anxiety as well as trazodone for sleep.  Unfortunately we have no laboratories on her currently.  Her blood pressure is stable, and she is mildly tachycardic at a rate of 107.  She is afebrile.  She does have the wound to her left ankle, and we will treat that accordingly.  Observation Level/Precautions:  15 minute checks  Laboratory:  Chemistry Profile  Psychotherapy:    Medications:    Consultations:    Discharge Concerns:    Estimated LOS:  Other:     Physician Treatment Plan for Primary Diagnosis: Anxiety and depression Long Term Goal(s): Improvement in symptoms so as ready for discharge  Short Term Goals: Ability to identify changes in lifestyle to reduce recurrence of condition will improve, Ability to verbalize feelings will improve, Ability to disclose and discuss suicidal ideas, Ability to demonstrate  self-control will improve, Ability to identify and develop effective coping behaviors will improve, Ability to maintain clinical measurements within normal  limits will improve and Compliance with prescribed medications will improve  Physician Treatment Plan for Secondary Diagnosis: Principal Problem:   Anxiety and depression  Long Term Goal(s): Improvement in symptoms so as ready for discharge  Short Term Goals: Ability to identify changes in lifestyle to reduce recurrence of condition will improve, Ability to verbalize feelings will improve, Ability to disclose and discuss suicidal ideas, Ability to demonstrate self-control will improve, Ability to identify and develop effective coping behaviors will improve, Ability to maintain clinical measurements within normal limits will improve and Compliance with prescribed medications will improve  I certify that inpatient services furnished can reasonably be expected to improve the patient's condition.    Antonieta Pert, MD 7/14/20213:24 PM
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
## 2020-07-09 NOTE — BH Assessment (Signed)
Comprehensive Clinical Assessment (CCA) Screening, Triage and Referral Note  07/09/2020 Wanda Torres [id number]  Visit Diagnosis:  F33.2, Major depressive disorder, Recurrent episode, Severe   ICD-10-CM   1. Anxiety and depression  F41.9    F32.9    Wanda Torres is a 29 year old patient who was voluntarily brought to [facility] by her friend due to pt having increased depression over the past month after she stopped taking her medication, which included Zoloft 150mg  and Vyvanse 50mg . Pt states that, since d/c her medication (cold-turkey), she has had worsening depression and she has begun to experience SI with plans to kill herself. Pt states things got bad enough tonight that she planned to hire a babysitter, take a bottle of Asprin and drink excessively, drive over a bridge, or find someone to buy drugs from and o/d. She states one of her best friends killed herself years ago by hanging and that she doesn't want to leave a mess for anyone to have to clean up.  Pt acknowledges SI with a plan. She denies HI, AVH, access to guns/weapons, engagement with the legal system, or the use of substances (with the exception of EtOH). Pt shares she engaged in NSSIB via cutting with a pocket knife this afternoon/evening, which is something she has only engaged in on one other occasion, which was approximately 6 years ago. Pt stated she engaged in this due to dissociating and needing to be able to feel something so she could cook her children dinner.  Pt's protective factors include stable housing, no HI or AVH, and the support of her friends and her sister.  Pt is oriented x5. Her recent and remote memory is intact. Pt was pleasant and cooperative throughout the assessment process, though she was, at times, irritable due to lack of sleep and either the same questions being asked or questions being asked in an insensitive manner. Pt's insight, judgement, and impulse control is fair-poor at this  time.   Patient Reported Information How did you hear about ? No data recorded  Referral name: No data recorded  Referral phone number: No data recorded Whom do you see for routine medical problems? No data recorded  Practice/Facility Name: No data recorded  Practice/Facility Phone Number: No data recorded  Name of Contact: No data recorded  Contact Number: No data recorded  Contact Fax Number: No data recorded  Prescriber Name: No data recorded  Prescriber Address (if known): No data recorded What Is the Reason for Your Visit/Call Today? No data recorded How Long Has This Been Causing You Problems? No data recorded Have You Recently Been in Any Inpatient Treatment (Hospital/Detox/Crisis Center/28-Day Program)? No data recorded  Name/Location of Program/Hospital:No data recorded  How Long Were You There? No data recorded  When Were You Discharged? No data recorded Have You Ever Received Services From [facility] Before? No data recorded  Who Do You See at [facility]? No data recorded Have You Recently Had Any Thoughts About Hurting Yourself? No data recorded  Are You Planning to Commit Suicide/Harm Yourself At This time?  No data recorded Have you Recently Had Thoughts About Hurting Someone [facility]? No data recorded  Explanation: No data recorded Have You Used Any Alcohol or Drugs in the Past 24 Hours? No data recorded  How Long Ago Did You Use Drugs or Alcohol?  No data recorded  What Did You Use and How Much? No data recorded What Do You Feel Would Help You the Most Today? No data recorded  Do You Currently Have a Therapist/Psychiatrist? No data recorded  Name of Therapist/Psychiatrist: No data recorded  Have You Been Recently Discharged From Any Office Practice or Programs? No data recorded  Explanation of Discharge From Practice/Program:  No data recorded    CCA Screening Triage Referral Assessment Type of Contact: No data recorded  Is this Initial or Reassessment? No data  recorded  Date Telepsych consult ordered in [facility]:  No data recorded  Time Telepsych consult ordered in [facility]:  No data recorded Patient Reported Information Reviewed? No data recorded  Patient Left Without Being Seen? No data recorded  Reason for Not Completing Assessment: No data recorded Collateral Involvement: No data recorded Does Patient Have a Court Appointed Legal Guardian? No data recorded  Name and Contact of Legal Guardian:  No data recorded If Minor and Not Living with Parent(s), Who has Custody? No data recorded Is CPS involved or ever been involved? No data recorded Is APS involved or ever been involved? No data recorded Patient Determined To Be At Risk for Harm To Self or Others Based on Review of Patient Reported Information or Presenting Complaint? No data recorded  Method: No data recorded  Availability of Means: No data recorded  Intent: No data recorded  Notification Required: No data recorded  Additional Information for Danger to Others Potential:  No data recorded  Additional Comments for Danger to Others Potential:  No data recorded  Are There Guns or Other Weapons in Your Home?  No data recorded   Types of Guns/Weapons: No data recorded   Are These Weapons Safely Secured?                              No data recorded   Who Could Verify You Are Able To Have These Secured:    No data recorded Do You Have any Outstanding Charges, Pending Court Dates, Parole/Probation? No data recorded Contacted To Inform of Risk of Harm To Self or Others: No data recorded Location of Assessment: No data recorded Does Patient Present under Involuntary Commitment? No data recorded  IVC Papers Initial File Date: No data recorded  [place] of Residence: No data recorded Patient Currently Receiving the Following Services: No data recorded  Determination of Need: No data recorded  Options For Referral: No data recorded  Chevis Pretty, NP, reviewed pt's chart and information and talked with  pt and determined she meets inpatient criteria. Pt was accepted to [facility] Room 305-2 pending a negative COVID test.  Ralph Dowdy, LMFT
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
## 2020-07-09 NOTE — Tx Team (Signed)
Interdisciplinary Treatment and Diagnostic Plan Update  07/09/2020 Time of Session: 9:20am Wanda Torres MRN: [id number]  Principal Diagnosis: Anxiety and depression  Secondary Diagnoses: Principal Problem:   Anxiety and depression   Current Medications:  Current Facility-Administered Medications  Medication Dose Route Frequency Provider Last Rate Last Admin  . acetaminophen (TYLENOL) tablet 650 mg  650 mg Oral Q6H PRN Anike, Adaku C, NP   650 mg at 07/09/20 0333  . alum & mag hydroxide-simeth (MAALOX/MYLANTA) 200-200-20 MG/5ML suspension 30 mL  30 mL Oral Q4H PRN Anike, Adaku C, NP      . DULoxetine (CYMBALTA) DR capsule 30 mg  30 mg Oral Daily Sharma Covert, MD   30 mg at 07/09/20 1013  . hydrOXYzine (ATARAX/VISTARIL) tablet 25 mg  25 mg Oral TID PRN Anike, Adaku C, NP   25 mg at 07/09/20 0333  . magnesium hydroxide (MILK OF MAGNESIA) suspension 30 mL  30 mL Oral Daily PRN Anike, Adaku C, NP      . neomycin-bacitracin-polymyxin (NEOSPORIN) ointment packet   Topical TID Sharma Covert, MD   Given at 07/09/20 1151  . nicotine (NICODERM CQ - dosed in mg/24 hours) patch 21 mg  21 mg Transdermal Daily Sharma Covert, MD   21 mg at 07/09/20 1152  . traZODone (DESYREL) tablet 50 mg  50 mg Oral QHS PRN Anike, Adaku C, NP       PTA Medications: Medications Prior to Admission  Medication Sig Dispense Refill Last Dose  . levonorgestrel (MIRENA) 20 MCG/24HR IUD 1 Intra Uterine Device by Intrauterine route continuous.   04/16/2019  . sertraline (ZOLOFT) 100 MG tablet Take 100 mg by mouth daily.   06/17/2020 at Unknown time  . amphetamine-dextroamphetamine (ADDERALL XR) 10 MG 24 hr capsule Take 1-2 capsules (10-20 mg total) by mouth daily. (Patient not taking: Reported on 07/09/2020) 60 capsule 0 Not Taking at Unknown time  . amphetamine-dextroamphetamine (ADDERALL) 10 MG tablet Take 1 tablet (10 mg total) by mouth daily. (Patient not taking: Reported on 07/09/2020) 30 tablet 0 Not  Taking at Unknown time  . HYDROcodone-acetaminophen (NORCO/VICODIN) 5-325 MG tablet Take 1 tablet by mouth every 12 (twelve) hours as needed. (Patient not taking: Reported on 07/09/2020) 8 tablet 0 Not Taking at Unknown time  . metoCLOPramide (REGLAN) 10 MG tablet Take 1 tablet (10 mg total) by mouth every 6 (six) hours for 5 days. (Patient not taking: Reported on 07/09/2020) 20 tablet 0 Not Taking  . ondansetron (ZOFRAN) 4 MG tablet Take 1 tablet (4 mg total) by mouth every 6 (six) hours. (Patient not taking: Reported on 07/09/2020) 12 tablet 0 Not Taking at Unknown time    Patient Stressors: Marital or family conflict Medication change or noncompliance Substance abuse Traumatic event  Patient Strengths: Ability for insight Capable of independent living Communication skills Supportive family/friends Work skills  Treatment Modalities: Medication Management, Group therapy, Case management,  1 to 1 session with clinician, Psychoeducation, Recreational therapy.   Physician Treatment Plan for Primary Diagnosis: Anxiety and depression Long Term Goal(s): Improvement in symptoms so as ready for discharge Improvement in symptoms so as ready for discharge   Short Term Goals: Ability to identify changes in lifestyle to reduce recurrence of condition will improve Ability to demonstrate self-control will improve Ability to identify and develop effective coping behaviors will improve Ability to maintain clinical measurements within normal limits will improve Compliance with prescribed medications will improve Ability to identify triggers associated with substance abuse/mental health issues will  improve Ability to identify changes in lifestyle to reduce recurrence of condition will improve Ability to demonstrate self-control will improve Ability to identify and develop effective coping behaviors will improve Ability to maintain clinical measurements within normal limits will improve Compliance with  prescribed medications will improve Ability to identify triggers associated with substance abuse/mental health issues will improve  Medication Management: Evaluate patient's response, side effects, and tolerance of medication regimen.  Therapeutic Interventions: 1 to 1 sessions, Unit Group sessions and Medication administration.  Evaluation of Outcomes: Not Met  Physician Treatment Plan for Secondary Diagnosis: Principal Problem:   Anxiety and depression  Long Term Goal(s): Improvement in symptoms so as ready for discharge Improvement in symptoms so as ready for discharge   Short Term Goals: Ability to identify changes in lifestyle to reduce recurrence of condition will improve Ability to demonstrate self-control will improve Ability to identify and develop effective coping behaviors will improve Ability to maintain clinical measurements within normal limits will improve Compliance with prescribed medications will improve Ability to identify triggers associated with substance abuse/mental health issues will improve Ability to identify changes in lifestyle to reduce recurrence of condition will improve Ability to demonstrate self-control will improve Ability to identify and develop effective coping behaviors will improve Ability to maintain clinical measurements within normal limits will improve Compliance with prescribed medications will improve Ability to identify triggers associated with substance abuse/mental health issues will improve     Medication Management: Evaluate patient's response, side effects, and tolerance of medication regimen.  Therapeutic Interventions: 1 to 1 sessions, Unit Group sessions and Medication administration.  Evaluation of Outcomes: Not Met   RN Treatment Plan for Primary Diagnosis: Anxiety and depression Long Term Goal(s): Knowledge of disease and therapeutic regimen to maintain health will improve  Short Term Goals: Ability to remain free from  injury will improve, Ability to participate in decision making will improve, Ability to identify and develop effective coping behaviors will improve and Compliance with prescribed medications will improve  Medication Management: RN will administer medications as ordered by provider, will assess and evaluate patient's response and provide education to patient for prescribed medication. RN will report any adverse and/or side effects to prescribing provider.  Therapeutic Interventions: 1 on 1 counseling sessions, Psychoeducation, Medication administration, Evaluate responses to treatment, Monitor vital signs and CBGs as ordered, Perform/monitor CIWA, COWS, AIMS and Fall Risk screenings as ordered, Perform wound care treatments as ordered.  Evaluation of Outcomes: Not Met   LCSW Treatment Plan for Primary Diagnosis: Anxiety and depression Long Term Goal(s): Safe transition to appropriate next level of care at discharge, Engage patient in therapeutic group addressing interpersonal concerns.  Short Term Goals: Engage patient in aftercare planning with referrals and resources, Increase social support, Identify triggers associated with mental health/substance abuse issues and Increase skills for wellness and recovery  Therapeutic Interventions: Assess for all discharge needs, 1 to 1 time with Social worker, Explore available resources and support systems, Assess for adequacy in community support network, Educate family and significant other(s) on suicide prevention, Complete Psychosocial Assessment, Interpersonal group therapy.  Evaluation of Outcomes: Not Met   Progress in Treatment: Attending groups: No. Participating in groups: No. Taking medication as prescribed: Yes. Toleration medication: Yes. Family/Significant other contact made: No, will contact:  if consent is provided. Patient understands diagnosis: Yes. Discussing patient identified problems/goals with staff: Yes. Medical problems  stabilized or resolved: Yes. Denies suicidal/homicidal ideation: Yes. Issues/concerns per patient self-inventory: No.   New problem(s) identified: No, Describe:  CSW will continue to assess.  New Short Term/Long Term Goal(s): medication stabilization, elimination of SI thoughts, development of comprehensive mental wellness plan.   Patient Goals:  "To get my medicine straight and to not have suicidal thoughts"  Discharge Plan or Barriers: Patient recently admitted. CSW will continue to follow and assess for appropriate referrals and possible discharge planning.   Reason for Continuation of Hospitalization: Anxiety Depression Medication stabilization Suicidal ideation  Estimated Length of Stay: 3-5 days   Attendees: Patient:  07/09/2020   Physician: Myles Lipps, MD 07/09/2020   Nursing:  07/09/2020   RN Care Manager: 07/09/2020   Social Worker: Darletta Moll, [place] 07/09/2020   Recreational Therapist:  07/09/2020   Other:  07/09/2020   Other:  07/09/2020   Other: 07/09/2020      Scribe for Treatment Team: Vassie Moselle, [place] 07/09/2020 3:11 PM
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
## 2020-07-09 NOTE — Tx Team (Signed)
Initial Treatment Plan 07/09/2020 5:13 AM SAM WUNSCHEL YQM:[id number]    PATIENT STRESSORS: Marital or family conflict Medication change or noncompliance Substance abuse Traumatic event   PATIENT STRENGTHS: Ability for insight Capable of independent living Communication skills Supportive family/friends Work skills   PATIENT IDENTIFIED PROBLEMS: Anxiety  Depression   "My medication adjusted"  "Not to die"               DISCHARGE CRITERIA:  Ability to meet basic life and health needs Improved stabilization in mood, thinking, and/or behavior Medical problems require only outpatient monitoring Motivation to continue treatment in a less acute level of care  PRELIMINARY DISCHARGE PLAN: Attend aftercare/continuing care group Attend PHP/IOP Outpatient therapy Return to previous living arrangement Return to previous work or school arrangements  PATIENT/FAMILY INVOLVEMENT: This treatment plan has been presented to and reviewed with the patient, Wanda Torres, and/or family member.  The patient and family have been given the opportunity to ask questions and make suggestions.  Bethann Punches, RN 07/09/2020, 5:13 AM
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
## 2020-07-09 NOTE — Progress Notes (Addendum)
   07/09/20 2030  Psych Admission Type (Psych Patients Only)  Admission Status Voluntary  Psychosocial Assessment  Patient Complaints Anxiety;Worrying  Eye Contact Fair  Facial Expression Anxious;Worried;Sad  Affect Anxious;Depressed;Sad  Speech Logical/coherent  Interaction Assertive  Motor Activity Other (Comment) (WNL)  Appearance/Hygiene Unremarkable  Behavior Characteristics Cooperative;Anxious  Mood Anxious;Sad;Pleasant  Thought Process  Coherency WDL  Content Blaming self  Delusions WDL  Perception WDL  Hallucination None reported or observed  Judgment Poor  Confusion None  Danger to Self  Current suicidal ideation? Passive  Self-Injurious Behavior Some self-injurious ideation observed or expressed.  No lethal plan expressed   Agreement Not to Harm Self Yes  Description of Agreement verbal  Danger to Others  Danger to Others None reported or observed   Pt pleasant. Pt rates anxiety 6-7/10. Pt is worried about being in here and not seeing her three kids. She says they are in a safe place with her sister and a friend. This is her first psychiatric admission. Pt acknowledges that she stopped her depression meds because she felt they were not working for her. She knows that she shouldn't have done that. Pt educated on the dangers of stopping these meds without tapering off. Pt also educated on the need to communicate with her doctor about any side effects or inefficacy of medications so this can be addressed.  Pt endorses passive SI but contracts for safety.

## 2020-07-10 DIAGNOSIS — F329 Major depressive disorder, single episode, unspecified: Secondary | ICD-10-CM

## 2020-07-10 DIAGNOSIS — F419 Anxiety disorder, unspecified: Secondary | ICD-10-CM

## 2020-07-10 DIAGNOSIS — F332 Major depressive disorder, recurrent severe without psychotic features: Secondary | ICD-10-CM

## 2020-07-10 LAB — URINALYSIS, COMPLETE (UACMP) WITH MICROSCOPIC
Bilirubin Urine: NEGATIVE
Glucose, UA: NEGATIVE mg/dL
Hgb urine dipstick: NEGATIVE
Ketones, ur: NEGATIVE mg/dL
Leukocytes,Ua: NEGATIVE
Nitrite: NEGATIVE
Protein, ur: NEGATIVE mg/dL
Specific Gravity, Urine: 1.02 (ref 1.005–1.030)
pH: 7 (ref 5.0–8.0)

## 2020-07-10 LAB — RAPID URINE DRUG SCREEN, HOSP PERFORMED
Amphetamines: NOT DETECTED
Barbiturates: NOT DETECTED
Benzodiazepines: NOT DETECTED
Cocaine: NOT DETECTED
Opiates: NOT DETECTED
Tetrahydrocannabinol: NOT DETECTED

## 2020-07-10 MED ORDER — GABAPENTIN 100 MG PO CAPS
100.0000 mg | ORAL_CAPSULE | Freq: Three times a day (TID) | ORAL | Status: DC
  Administered 2020-07-10 – 2020-07-11 (×2): 100 mg via ORAL
  Filled 2020-07-10 (×5): qty 1
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
## 2020-07-10 NOTE — Plan of Care (Signed)
Progress note  D: pt found in bed; compliant with medication administration. Pt feels their anxiety medication is working, but not as well as they would hope. Pt still has interspersed episodes of anxiety between administration. Pt states poor sleep, but was unaware they had PRN sleep medications. Pt was educated on coming out of their room more and socializing/attending groups as isolation probably isn't helping their depression. Pt stated they would inform RN when they were ready for wound care after their daily shower. Pt endorses passive si with no plan but agrees to approach staff before harming self while at bhh. Pt denies hi/ah/vh and verbally agrees to approach staff if these become apparent or before harming themself/others while at b[facility].  A: Pt provided support and encouragement. Pt given medication per protocol and standing orders. Q23m safety checks implemented and continued.  R: Pt safe on the unit. Will continue to monitor.  Pt progressing in the following metrics  Problem: Coping: Goal: Ability to identify and develop effective coping behavior will improve Outcome: Progressing   Problem: Education: Goal: Utilization of techniques to improve thought processes will improve Outcome: Progressing Goal: Knowledge of the prescribed therapeutic regimen will improve Outcome: Progressing   Problem: Coping: Goal: Coping ability will improve Outcome: Progressing

## 2020-07-10 NOTE — [facility] Suicide Risk Assessment (Signed)
[facility] INPATIENT:  Family/Significant Other Suicide Prevention Education  Suicide Prevention Education:  Education Completed; Wanda Torres [phone],  (name of family member/significant other) has been identified by the patient as the family member/significant other with whom the patient will be residing, and identified as the person(s) who will aid the patient in the event of a mental health crisis (suicidal ideations/suicide attempt).  With written consent from the patient, the family member/significant other has been provided the following suicide prevention education, prior to the and/or following the discharge of the patient.  The suicide prevention education provided includes the following:  Suicide risk factors  Suicide prevention and interventions  National Suicide Hotline telephone number  [facility] assessment telephone number  [facility] Emergency Assistance 911  [facility] and/or Residential Mobile Crisis Unit telephone number  Request made of family/significant other to:  Remove weapons (e.g., guns, rifles, knives), all items previously/currently identified as safety concern.    Remove drugs/medications (over-the-counter, prescriptions, illicit drugs), all items previously/currently identified as a safety concern.  The family member/significant other verbalizes understanding of the suicide prevention education information provided.  The family member/significant other agrees to remove the items of safety concern listed above.  Wanda Torres 07/10/2020, 3:03 PM

## 2020-07-10 NOTE — Progress Notes (Signed)
[facility] MD Progress Note  07/10/2020 3:04 PM Wanda Torres  MRN:  [id number]  Subjective: Wanda Torres reports, "I'm still feeling very depressed today. My anxiety is to the roof. The vistaril is not helping me. I'm wondering if there is anything else to try for this anxiety".  Objective: Patient is a 29 year old female with a past psychiatric history significant for depression and anxiety who presented to the [facility] behavioral health hospital with a friend secondary to worsening depression over the last 4 to 6 weeks. The patient stated that she had been treated with Zoloft as well as Vyvanse. She decided to go off medications approximately 4 to 6 weeks ago. Since then she has had worsening depression, and feeling overwhelmed. She began to have thoughts of self-harm, and cut her left ankle. She felt so overwhelmed today that she considered hiring babysitter, and been taking aspirin and drinking excessively hoping to die. She stated she had been treated for depression since approximately age 27.  Wanda Torres is seen, chart reviewed. The chart findings discussed with the treatment team. She is lying down in her bed napping, easily aroused. She is making good eye contact. She is verbally responsive. She says she is not doing her best today. She says she was feeling very depressed at home. She says she cut her ankle to relieve some emotional pain the other day. However, the self inflicted laceration started hurting her badly & she developed suicidal ideations. She says she is feeling very depressed today & her anxiety is to the roof. She says she is taking the vistaril, but not very helpful. She is asking for something else that may do better for her than the Vistaril. She is taking & tolerating her treatment regimen. Denies any adverse effects. She continues to endorse suicidal ideations, denies any plans or intent to hurt herself. She is able to verbally contract for safety. She currently denies any HI,  AVH, delusional thoughts or paranoia. She does not appear to be responding to any internal stimuli. Wanda Torres rates her depression #10 & anxiety #8. She is in agreement to start gabapentin 100 mg po tid for the anxiety.  Principal Problem: Anxiety and depression  Diagnosis: Principal Problem:   Anxiety and depression  Total Time spent with patient: 25 minutes  Past Psychiatric History:   Past Medical History:  Past Medical History:  Diagnosis Date  . Acne   . Allergy   . Anemia   . Anxiety   . Psoriasis   . Thyroid disease    History reviewed. No pertinent surgical history.  Family History: History reviewed. No pertinent family history.  Family Psychiatric  History: See H&P  Social History:  Social History   Substance and Sexual Activity  Alcohol Use Yes  . Alcohol/week: 0.0 standard drinks     Social History   Substance and Sexual Activity  Drug Use Never    Social History   Socioeconomic History  . Marital status: Single    Spouse name: Not on file  . Number of children: Not on file  . Years of education: Not on file  . Highest education level: Not on file  Occupational History    Comment: Supervisor reservations; 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[employer]  Tobacco Use  . Smoking status: Never Smoker  . Smokeless tobacco: Never Used  Substance and Sexual Activity  . Alcohol use: Yes    Alcohol/week: 0.0 standard drinks  . Drug use: Never  . Sexual activity: Not on file  Other Topics  Concern  . Not on file  Social History Narrative  . Not on file   Social Determinants of Health   Financial Resource Strain:   . Difficulty of Paying Living Expenses:   Food Insecurity:   . Worried About Programme researcher, broadcasting/film/video in the Last Year:   . Barista in the Last Year:   Transportation Needs:   . Freight forwarder (Medical):   Marland Kitchen Lack of Transportation (Non-Medical):   Physical Activity:   . Days of Exercise per Week:   . Minutes of Exercise per Session:    Stress:   . Feeling of Stress :   Social Connections:   . Frequency of Communication with Friends and Family:   . Frequency of Social Gatherings with Friends and Family:   . Attends Religious Services:   . Active Member of Clubs or Organizations:   . Attends Banker Meetings:   Marland Kitchen Marital Status:    Additional Social History:    Pain Medications: See MAR Prescriptions: See MAR Over the Counter: See MAR Withdrawal Symptoms: Nausea / Vomiting  Sleep: 6.75 per doucumentation. However, patient says she did not sleep well last night.  Appetite:  Fair  Current Medications: Current Facility-Administered Medications  Medication Dose Route Frequency Provider Last Rate Last Admin  . acetaminophen (TYLENOL) tablet 650 mg  650 mg Oral Q6H PRN Anike, Adaku C, NP   650 mg at 07/09/20 1604  . alum & mag hydroxide-simeth (MAALOX/MYLANTA) 200-200-20 MG/5ML suspension 30 mL  30 mL Oral Q4H PRN Anike, Adaku C, NP      . DULoxetine (CYMBALTA) DR capsule 30 mg  30 mg Oral Daily Antonieta Pert, MD   30 mg at 07/10/20 0747  . hydrOXYzine (ATARAX/VISTARIL) tablet 25 mg  25 mg Oral TID PRN Anike, Adaku C, NP   25 mg at 07/10/20 0641  . magnesium hydroxide (MILK OF MAGNESIA) suspension 30 mL  30 mL Oral Daily PRN Anike, Adaku C, NP      . neomycin-bacitracin-polymyxin (NEOSPORIN) ointment packet   Topical TID Antonieta Pert, MD   1 application at 07/10/20 0746  . nicotine (NICODERM CQ - dosed in mg/24 hours) patch 21 mg  21 mg Transdermal Daily Antonieta Pert, MD   21 mg at 07/10/20 0747  . traZODone (DESYREL) tablet 50 mg  50 mg Oral QHS PRN Anike, Adaku C, NP        Lab Results:  Results for orders placed or performed during the hospital encounter of 07/09/20 (from the past 48 hour(s))  Urinalysis, Complete w Microscopic     Status: Abnormal   Collection Time: 07/09/20  2:55 AM  Result Value Ref Range   Color, Urine YELLOW YELLOW   APPearance CLEAR CLEAR   Specific Gravity,  Urine 1.020 1.005 - 1.030   pH 7.0 5.0 - 8.0   Glucose, UA NEGATIVE NEGATIVE mg/dL   Hgb urine dipstick NEGATIVE NEGATIVE   Bilirubin Urine NEGATIVE NEGATIVE   Ketones, ur NEGATIVE NEGATIVE mg/dL   Protein, ur NEGATIVE NEGATIVE mg/dL   Nitrite NEGATIVE NEGATIVE   Leukocytes,Ua NEGATIVE NEGATIVE   RBC / HPF 0-5 0 - 5 RBC/hpf   WBC, UA 0-5 0 - 5 WBC/hpf   Bacteria, UA RARE (A) NONE SEEN   Squamous Epithelial / LPF 0-5 0 - 5    Comment: Performed at [facility], [address], [place], [place] [address]  Urine rapid drug screen (hosp performed)not at [facility]  Status: None   Collection Time: 07/09/20  2:55 AM  Result Value Ref Range   Opiates NONE DETECTED NONE DETECTED   Cocaine NONE DETECTED NONE DETECTED   Benzodiazepines NONE DETECTED NONE DETECTED   Amphetamines NONE DETECTED NONE DETECTED   Tetrahydrocannabinol NONE DETECTED NONE DETECTED   Barbiturates NONE DETECTED NONE DETECTED    Comment: (NOTE) DRUG SCREEN FOR MEDICAL PURPOSES ONLY.  IF CONFIRMATION IS NEEDED FOR ANY PURPOSE, NOTIFY LAB WITHIN 5 DAYS.  LOWEST DETECTABLE LIMITS FOR URINE DRUG SCREEN Drug Class                     Cutoff (ng/mL) Amphetamine and metabolites    1000 Barbiturate and metabolites    200 Benzodiazepine                 200 Tricyclics and metabolites     300 Opiates and metabolites        300 Cocaine and metabolites        300 THC                            50 Performed at [facility], [address] [place], [place] [address]   SARS Coronavirus 2 by RT PCR (hospital order, performed in [facility] hospital lab) Nasopharyngeal Nasopharyngeal Swab     Status: None   Collection Time: 07/09/20  3:07 AM   Specimen: Nasopharyngeal Swab  Result Value Ref Range   SARS Coronavirus 2 NEGATIVE NEGATIVE    Comment: (NOTE) SARS-CoV-2 target nucleic acids are NOT DETECTED.  The SARS-CoV-2 RNA is generally detectable in upper and lower respiratory specimens  during the acute phase of infection. The lowest concentration of SARS-CoV-2 viral copies this assay can detect is 250 copies / mL. A negative result does not preclude SARS-CoV-2 infection and should not be used as the sole basis for treatment or other patient management decisions.  A negative result may occur with improper specimen collection / handling, submission of specimen other than nasopharyngeal swab, presence of viral mutation(s) within the areas targeted by this assay, and inadequate number of viral copies (<250 copies / mL). A negative result must be combined with clinical observations, patient history, and epidemiological information.  Fact Sheet for Patients:   BoilerBrush.com.cy  Fact Sheet for Healthcare Providers: https://pope.com/  This test is not yet approved or  cleared by the [place] FDA and has been authorized for detection and/or diagnosis of SARS-CoV-2 by FDA under an Emergency Use Authorization (EUA).  This EUA will remain in effect (meaning this test can be used) for the duration of the COVID-19 declaration under Section 564(b)(1) of the Act, 21 U.S.C. section 360bbb-3(b)(1), unless the authorization is terminated or revoked sooner.  Performed at [facility], [address], [place], [place] [address]   Comprehensive metabolic panel     Status: Abnormal   Collection Time: 07/09/20  5:45 PM  Result Value Ref Range   Sodium 137 135 - 145 mmol/L   Potassium 3.9 3.5 - 5.1 mmol/L   Chloride 101 98 - 111 mmol/L   CO2 26 22 - 32 mmol/L   Glucose, Bld 103 (H) 70 - 99 mg/dL    Comment: Glucose reference range applies only to samples taken after fasting for at least 8 hours.   BUN 18 6 - 20 mg/dL   Creatinine, Ser 3.47 0.44 - 1.00 mg/dL   Calcium 8.9 8.9 -  10.3 mg/dL   Total Protein 7.2 6.5 - 8.1 g/dL   Albumin 4.2 3.5 - 5.0 g/dL   AST 50 (H) 15 - 41 U/L   ALT 49 (H) 0 - 44 U/L    Alkaline Phosphatase 73 38 - 126 U/L   Total Bilirubin 0.7 0.3 - 1.2 mg/dL   GFR calc non Af Amer >60 >60 mL/min   GFR calc Af Amer >60 >60 mL/min   Anion gap 10 5 - 15    Comment: Performed at [facility], [address], [place], [place] [address]  Hemoglobin A1c     Status: Abnormal   Collection Time: 07/09/20  5:45 PM  Result Value Ref Range   Hgb A1c MFr Bld 4.4 (L) 4.8 - 5.6 %    Comment: (NOTE) Pre diabetes:          5.7%-6.4%  Diabetes:              >6.4%  Glycemic control for   <7.0% adults with diabetes    Mean Plasma Glucose 79.58 mg/dL    Comment: Performed at [facility], [address] [place], [place] [address]  Magnesium     Status: None   Collection Time: 07/09/20  5:45 PM  Result Value Ref Range   Magnesium 2.3 1.7 - 2.4 mg/dL    Comment: Performed at [facility], [address], [place], [place] [address]  Ethanol     Status: None   Collection Time: 07/09/20  5:45 PM  Result Value Ref Range   Alcohol, Ethyl (B) <10 <10 mg/dL    Comment: (NOTE) Lowest detectable limit for serum alcohol is 10 mg/dL.  For medical purposes only. Performed at [facility], [address], [place], [place] [address]   Lipid panel     Status: None   Collection Time: 07/09/20  5:45 PM  Result Value Ref Range   Cholesterol 131 0 - 200 mg/dL   Triglycerides 700 <174 mg/dL   HDL 59 >94 mg/dL   Total CHOL/HDL Ratio 2.2 RATIO   VLDL 29 0 - 40 mg/dL   LDL Cholesterol 43 0 - 99 mg/dL    Comment:        Total Cholesterol/HDL:CHD Risk Coronary Heart Disease Risk Table                     Men   Women  1/2 Average Risk   3.4   3.3  Average Risk       5.0   4.4  2 X Average Risk   9.6   7.1  3 X Average Risk  23.4   11.0        Use the calculated Patient Ratio above and the CHD Risk Table to determine the patient's CHD Risk.        ATP III CLASSIFICATION (LDL):  <100     mg/dL   Optimal  496-759  mg/dL   Near  or Above                    Optimal  130-159  mg/dL   Borderline  163-846  mg/dL   High  >659     mg/dL   Very High Performed at [facility], [address] [place], [place] [address]   Hepatic function panel     Status: Abnormal   Collection Time: 07/09/20  5:45 PM  Result Value Ref Range  Total Protein 7.1 6.5 - 8.1 g/dL   Albumin 4.2 3.5 - 5.0 g/dL   AST 49 (H) 15 - 41 U/L   ALT 49 (H) 0 - 44 U/L   Alkaline Phosphatase 72 38 - 126 U/L   Total Bilirubin 0.7 0.3 - 1.2 mg/dL   Bilirubin, Direct 0.1 0.0 - 0.2 mg/dL   Indirect Bilirubin 0.6 0.3 - 0.9 mg/dL    Comment: Performed at [facility], [address] [place], [place] [address]  TSH     Status: None   Collection Time: 07/09/20  5:45 PM  Result Value Ref Range   TSH 2.264 0.350 - 4.500 uIU/mL    Comment: Performed by a 3rd Generation assay with a functional sensitivity of <=0.01 uIU/mL. Performed at [facility], [address] [place], [place] [address]    Blood Alcohol level:  Lab Results  Component Value Date   ETH <10 07/09/2020   [facility]    [facility]5        LOWEST DETECTABLE LIMIT FOR SERUM ALCOHOL IS 11 mg/dL FOR MEDICAL PURPOSES ONLY   Metabolic Disorder Labs: Lab Results  Component Value Date   HGBA1C 4.4 (L) 07/09/2020   MPG 79.58 07/09/2020   No results found for: PROLACTIN Lab Results  Component Value Date   CHOL 131 07/09/2020   TRIG 143 07/09/2020   HDL 59 07/09/2020   CHOLHDL 2.2 07/09/2020   VLDL 29 07/09/2020   LDLCALC 43 07/09/2020   Physical Findings: AIMS: Facial and Oral Movements Muscles of Facial Expression: None, normal Lips and Perioral Area: None, normal Jaw: None, normal Tongue: None, normal,Extremity Movements Upper (arms, wrists, hands, fingers): None, normal Lower (legs, knees, ankles, toes): None, normal, Trunk Movements Neck, shoulders, hips: None, normal, Overall Severity Severity of abnormal movements  (highest score from questions above): None, normal Incapacitation due to abnormal movements: None, normal Patient's awareness of abnormal movements (rate only patient's report): No Awareness, Dental Status Current problems with teeth and/or dentures?: No Does patient usually wear dentures?: No  CIWA:  CIWA-Ar Total: 6 COWS:  COWS Total Score: 6  Musculoskeletal: Strength & Muscle Tone: within normal limits Gait & Station: normal Patient leans: N/A  Psychiatric Specialty Exam: Physical Exam Vitals and nursing note reviewed.  HENT:     Head: Normocephalic.     Nose: Nose normal.     Mouth/Throat:     Pharynx: Oropharynx is clear.  Eyes:     Pupils: Pupils are equal, round, and reactive to light.  Cardiovascular:     Rate and Rhythm: Normal rate.     Pulses: Normal pulses.  Pulmonary:     Effort: Pulmonary effort is normal.  Genitourinary:    Comments: Deferred Musculoskeletal:     Cervical back: Normal range of motion.  Skin:    General: Skin is warm and dry.  Neurological:     Mental Status: She is alert and oriented to person, place, and time.     Review of Systems  Constitutional: Negative.   HENT: Negative.   Eyes: Negative.   Respiratory: Negative.   Cardiovascular: Negative.   Gastrointestinal: Negative.   Endocrine: Negative.   Genitourinary: Negative.  Negative for difficulty urinating.  Musculoskeletal: Positive for myalgias.  Allergic/Immunologic: Negative for environmental allergies and food allergies.       Allergies: NKDA  Neurological: Negative for dizziness, tremors, seizures, numbness and headaches.  Psychiatric/Behavioral: Positive for dysphoric mood (Rates #10 (scale 1-10)), self-injury (Hx of), sleep  disturbance and suicidal ideas (Hx.of). Negative for agitation, behavioral problems, confusion, decreased concentration and hallucinations. The patient is nervous/anxious (Rates #8 (scale 1-10)). The patient is not hyperactive.     Blood pressure  122/66, pulse 100, temperature 98.5 F (36.9 C), temperature source Oral, resp. rate 16, height 6' (1.829 m), weight 125.6 kg, SpO2 98 %, unknown if currently breastfeeding.Body mass index is 37.57 kg/m.  General Appearance: Disheveled  Eye Contact:  Minimal  Speech:  Normal Rate  Volume:  Decreased  Mood:  Depressed  Affect:  Congruent  Thought Process:  Coherent and Descriptions of Associations: Intact  Orientation:  Full (Time, Place, and Person)  Thought Content:  Logical  Suicidal Thoughts:  Yes.  without intent/plan  Homicidal Thoughts:  No  Memory:  Immediate;   Fair Recent;   Fair Remote;   Fair  Judgement:  Intact  Insight:  Fair  Psychomotor Activity:  Psychomotor Retardation  Concentration:  Concentration: Fair and Attention Span: Fair  Recall:  [employer] of Knowledge:  Good  Language:  Good  Akathisia:  Negative  Handed:  Right  AIMS (if indicated):     Assets:  Desire for Improvement Housing Resilience Talents/Skills Transportation Vocational/Educational  ADL's:  Intact  Cognition:  WNL    Sleep:  Number of Hours: 6.75   Treatment Plan Summary: Daily contact with patient to assess and evaluate symptoms and progress in treatment and Medication management.  - Continue inpatient hospitalization. - Will continue today 07/10/2020 plan as below except where it is noted.  Depression.    - Continue Duloxetine 30 mg po daily.   Anxiety/agitation.    - Initiated gabapentin 100 mg po tid.    - Continue Vistaril 25 mg po tid prn.  Insomnia.     - Continue Trazodone 50 mg po Q hs prn.  Nicotine withdrawal symptoms,     - Continue the Nicotine patch 21 mg topically Q 24.  Encourage group session participation to learn coping skills. Discharge disposition is ongoing.     Armandina Stammer, NP, PMHNP, FNP-BC 07/10/2020, 3:04 PM

## 2020-07-10 NOTE — Progress Notes (Signed)
Pt did attend the evening wrap up group. Pt was attentive, sharing, and supportive. Positive thinking and positive change were discussed.  

## 2020-07-10 NOTE — Progress Notes (Signed)
   07/10/20 2100  Psych Admission Type (Psych Patients Only)  Admission Status Voluntary  Psychosocial Assessment  Patient Complaints Anxiety  Eye Contact Fair  Facial Expression Anxious;Animated  Affect Anxious  Speech Logical/coherent  Interaction Assertive  Motor Activity Other (Comment) (WNL)  Appearance/Hygiene Unremarkable  Behavior Characteristics Cooperative;Anxious  Mood Anxious  Thought Process  Coherency WDL  Content WDL  Delusions None reported or observed  Perception WDL  Hallucination None reported or observed  Judgment Poor  Confusion None  Danger to Self  Current suicidal ideation? Denies  Danger to Others  Danger to Others None reported or observed   Pt rates anxiety 3/10. "I feel pretty good today." Pt took Trazodone tonight for sleep because she didn't sleep well last night.

## 2020-07-11 MED ORDER — TRAZODONE HCL 100 MG PO TABS
100.0000 mg | ORAL_TABLET | Freq: Every day | ORAL | Status: DC
  Administered 2020-07-11 – 2020-07-13 (×3): 100 mg via ORAL
  Filled 2020-07-11 (×6): qty 1

## 2020-07-11 MED ORDER — TRAZODONE HCL 100 MG PO TABS: 100.0000 mg | ORAL_TABLET | Freq: Every evening | ORAL | Status: DC | PRN

## 2020-07-11 MED ORDER — GABAPENTIN 100 MG PO CAPS
200.0000 mg | ORAL_CAPSULE | Freq: Three times a day (TID) | ORAL | Status: DC
  Administered 2020-07-11 – 2020-07-14 (×9): 200 mg via ORAL
  Filled 2020-07-11 (×13): qty 2

## 2020-07-11 NOTE — Progress Notes (Signed)
Pt denied SI/HI/AVH.  Endorsed feeling depressed and anxious.  Reported that she had trouble staying asleep last night.  RN administered medications per provider orders.  RN provided encouragement and reassurance. Q 15 min safety checks remain in place.

## 2020-07-11 NOTE — [facility] Counselor (Signed)
CSW called and left message regarding referral at [facility].

## 2020-07-11 NOTE — Progress Notes (Signed)
[facility]  07/11/2020 1:01 PM Rip Harbourlizabeth A Romanek  MRN:  [id number]  Subjective: Wanda Torres reports, "I'm feeling fine today. I don't know if this is all in my head, but after I took the gabapentin yesterday, it did actually help my anxiety. But for the sleep, the Trazodone helps me fall asleep, but does not keep me asleep. Can it be increased & also schedule at 09:00 PM at night. I'm still feeling depressed. My depression is at #8 & anxiety #7 today. No suicidal thoughts".  Objective: Patient is a 29 year old female with a past psychiatric history significant for depression and anxiety who [facility] the [facility] with a friend secondary to worsening depression over the last 4 to 6 weeks. The patient stated that she had been treated with Zoloft as well as Vyvanse. She decided to go off medications approximately 4 to 6 weeks ago. Since then she has had worsening depression, and feeling overwhelmed. She began to have thoughts of self-harm, and cut her left ankle. She felt so overwhelmed today that she considered hiring babysitter, and been taking aspirin and drinking excessively hoping to die. She stated she had been treated for depression since approximately age 29.  Wanda Torres is seen, chart reviewed. The chart findings discussed with the treatment team. She is lying down in her bed, but able to sit-up for this follow-up care assessment. She is making good eye contact. She is verbally responsive. She says she is doing fine, but continues to endorse being depressed & anxious still. She currently denies any SI.  Denies any plans or intent to hurt herself. She is able to verbally contract for safety. She currently denies any HI, AVH, delusional thoughts or paranoia. She does not appear to be responding to any internal stimuli. Wanda Torres says today that she noticed that her anxiety has improved since starting the gabapentin. She is explained that she is still on the  lower dose of the gabapentin. She agreed to increasing the gabapentin to 200 mg po tid. She rates her depression today #8 & anxiety #7. She adds for the record, that she was on Vyvanse prior to coming to the hospital. She feels she does not need it at this time, but plans on resuming it after discharge to be able to concentrate at her work. She has asked for her Trazodone to be scheduled at 9:00 pm Q hs. She is in agreement to continue current plan of care as already in progress. She is attending group sessions. No behavioral problems displayed..  Principal Problem: Major depressive disorder, recurrent episode, severe (HCC)  Diagnosis: Principal Problem:   Major depressive disorder, recurrent episode, severe (HCC) Active Problems:   Anxiety and depression  Total Time spent with patient: 25 minutes  Past Psychiatric History:   Past Medical History:  Past Medical History:  Diagnosis Date   Acne    Allergy    Anemia    Anxiety    Psoriasis    Thyroid disease    History reviewed. No pertinent surgical history.  Family History: History reviewed. No pertinent family history.  Family Psychiatric  History: See H&P  Social History:  Social History   Substance and Sexual Activity  Alcohol Use Yes   Alcohol/week: 0.0 standard drinks     Social History   Substance and Sexual Activity  Drug Use Never    Social History   Socioeconomic History   Marital status: Single    Spouse name: Not on file  Number of children: Not on file   Years of education: Not on file   Highest education level: Not on file  Occupational History    Comment: Supervisor reservations; 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[employer]  Tobacco Use   Smoking status: Never Smoker   Smokeless tobacco: Never Used  Substance and Sexual Activity   Alcohol use: Yes    Alcohol/week: 0.0 standard drinks   Drug use: Never   Sexual activity: Not on file  Other Topics Concern   Not on file  Social History  Narrative   Not on file   Social Determinants of Health   Financial Resource Strain:    Difficulty of Paying Living Expenses:   Food Insecurity:    Worried About Programme researcher, broadcasting/film/video in the Last Year:    Barista in the Last Year:   Transportation Needs:    Freight forwarder (Medical):    Lack of Transportation (Non-Medical):   Physical Activity:    Days of Exercise per Week:    Minutes of Exercise per Session:   Stress:    Feeling of Stress :   Social Connections:    Frequency of Communication with Friends and Family:    Frequency of Social Gatherings with Friends and Family:    Attends Religious Services:    Active Member of Clubs or Organizations:    Attends Banker Meetings:    Marital Status:    Additional Social History:    Pain Medications: See MAR Prescriptions: See MAR Over the Counter: See MAR Withdrawal Symptoms: Nausea / Vomiting  Sleep: 6.0 per doucumentation. However, patient says she did not sleep well last night.  Appetite:  Fair  Current Medications: Current Facility-Administered Medications  Medication Dose Route Frequency Provider Last Rate Last Admin   acetaminophen (TYLENOL) tablet 650 mg  650 mg Oral Q6H PRN Anike, Adaku C, NP   650 mg at 07/10/20 1715   alum & mag hydroxide-simeth (MAALOX/MYLANTA) 200-200-20 MG/5ML suspension 30 mL  30 mL Oral Q4H PRN Anike, Adaku C, NP       DULoxetine (CYMBALTA) DR capsule 30 mg  30 mg Oral Daily Antonieta Pert, MD   30 mg at 07/11/20 7106   gabapentin (NEURONTIN) capsule 200 mg  200 mg Oral TID Armandina Stammer I, NP   200 mg at 07/11/20 1249   hydrOXYzine (ATARAX/VISTARIL) tablet 25 mg  25 mg Oral TID PRN Anike, Adaku C, NP   25 mg at 07/11/20 0819   magnesium hydroxide (MILK OF MAGNESIA) suspension 30 mL  30 mL Oral Daily PRN Anike, Adaku C, NP       neomycin-bacitracin-polymyxin (NEOSPORIN) ointment packet   Topical TID Antonieta Pert, MD   1 application at  07/11/20 0817   nicotine (NICODERM CQ - dosed in mg/24 hours) patch 21 mg  21 mg Transdermal Daily Antonieta Pert, MD   21 mg at 07/11/20 0817   traZODone (DESYREL) tablet 100 mg  100 mg Oral QHS Armandina Stammer I, NP       Lab Results:  Results for orders placed or performed during the hospital encounter of 07/09/20 (from the past 48 hour(s))  Comprehensive metabolic panel     Status: Abnormal   Collection Time: 07/09/20  5:45 PM  Result Value Ref Range   Sodium 137 135 - 145 mmol/L   Potassium 3.9 3.5 - 5.1 mmol/L   Chloride 101 98 - 111 mmol/L   CO2 26 22 - 32 mmol/L  Glucose, Bld 103 (H) 70 - 99 mg/dL    Comment: Glucose reference range applies only to samples taken after fasting for at least 8 hours.   BUN 18 6 - 20 mg/dL   Creatinine, Ser 1.91 0.44 - 1.00 mg/dL   Calcium 8.9 8.9 - 47.8 mg/dL   Total Protein 7.2 6.5 - 8.1 g/dL   Albumin 4.2 3.5 - 5.0 g/dL   AST 50 (H) 15 - 41 U/L   ALT 49 (H) 0 - 44 U/L   Alkaline Phosphatase 73 38 - 126 U/L   Total Bilirubin 0.7 0.3 - 1.2 mg/dL   GFR calc non Af Amer >60 >60 mL/min   GFR calc Af Amer >60 >60 mL/min   Anion gap 10 5 - 15    Comment: Performed at [facility], [address], [place], [place] [address]  Hemoglobin A1c     Status: Abnormal   Collection Time: 07/09/20  5:45 PM  Result Value Ref Range   Hgb A1c MFr Bld 4.4 (L) 4.8 - 5.6 %    Comment: (NOTE) Pre diabetes:          5.7%-6.4%  Diabetes:              >6.4%  Glycemic control for   <7.0% adults with diabetes    Mean Plasma Glucose 79.58 mg/dL    Comment: Performed at [facility], [address], [place], [place] [address]  Magnesium     Status: None   Collection Time: 07/09/20  5:45 PM  Result Value Ref Range   Magnesium 2.3 1.7 - 2.4 mg/dL    Comment: Performed at [facility], [address] [place], [place] [address]  Ethanol     Status: None   Collection Time: 07/09/20  5:45 PM  Result Value Ref Range    Alcohol, Ethyl (B) <10 <10 mg/dL    Comment: (NOTE) Lowest detectable limit for serum alcohol is 10 mg/dL.  For medical purposes only. Performed at [facility], [address], [place], [place] [address]   Lipid panel     Status: None   Collection Time: 07/09/20  5:45 PM  Result Value Ref Range   Cholesterol 131 0 - 200 mg/dL   Triglycerides 284 <132 mg/dL   HDL 59 >44 mg/dL   Total CHOL/HDL Ratio 2.2 RATIO   VLDL 29 0 - 40 mg/dL   LDL Cholesterol 43 0 - 99 mg/dL    Comment:        Total Cholesterol/HDL:CHD Risk Coronary Heart Disease Risk Table                     Men   Women  1/2 Average Risk   3.4   3.3  Average Risk       5.0   4.4  2 X Average Risk   9.6   7.1  3 X Average Risk  23.4   11.0        Use the calculated Patient Ratio above and the CHD Risk Table to determine the patient's CHD Risk.        ATP III CLASSIFICATION (LDL):  <100     mg/dL   Optimal  010-272  mg/dL   Near or Above                    Optimal  130-159  mg/dL   Borderline  536-644  mg/dL  High  >190     mg/dL   Very High Performed at [facility], [address] [place], [place] [address]   Hepatic function panel     Status: Abnormal   Collection Time: 07/09/20  5:45 PM  Result Value Ref Range   Total Protein 7.1 6.5 - 8.1 g/dL   Albumin 4.2 3.5 - 5.0 g/dL   AST 49 (H) 15 - 41 U/L   ALT 49 (H) 0 - 44 U/L   Alkaline Phosphatase 72 38 - 126 U/L   Total Bilirubin 0.7 0.3 - 1.2 mg/dL   Bilirubin, Direct 0.1 0.0 - 0.2 mg/dL   Indirect Bilirubin 0.6 0.3 - 0.9 mg/dL    Comment: Performed at [facility], [address], [place], [place] [address]  TSH     Status: None   Collection Time: 07/09/20  5:45 PM  Result Value Ref Range   TSH 2.264 0.350 - 4.500 uIU/mL    Comment: Performed by a 3rd Generation assay with a functional sensitivity of <=0.01 uIU/mL. Performed at [facility], [address]  [place], [place] [address]    Blood Alcohol level:  Lab Results  Component Value Date   ETH <10 07/09/2020   [facility]  08/11/2007    <5        LOWEST DETECTABLE LIMIT FOR SERUM ALCOHOL IS 11 mg/dL FOR MEDICAL PURPOSES ONLY   Metabolic Disorder Labs: Lab Results  Component Value Date   HGBA1C 4.4 (L) 07/09/2020   MPG 79.58 07/09/2020   No results found for: PROLACTIN Lab Results  Component Value Date   CHOL 131 07/09/2020   TRIG 143 07/09/2020   HDL 59 07/09/2020   CHOLHDL 2.2 07/09/2020   VLDL 29 07/09/2020   LDLCALC 43 07/09/2020   Physical Findings: AIMS: Facial and Oral Movements Muscles of Facial Expression: None, normal Lips and Perioral Area: None, normal Jaw: None, normal Tongue: None, normal,Extremity Movements Upper (arms, wrists, hands, fingers): None, normal Lower (legs, knees, ankles, toes): None, normal, Trunk Movements Neck, shoulders, hips: None, normal, Overall Severity Severity of abnormal movements (highest score from questions above): None, normal Incapacitation due to abnormal movements: None, normal Patient's awareness of abnormal movements (rate only patient's report): No Awareness, Dental Status Current problems with teeth and/or dentures?: No Does patient usually wear dentures?: No  CIWA:  CIWA-Ar Total: 6 COWS:  COWS Total Score: 6  Musculoskeletal: Strength & Muscle Tone: within normal limits Gait & Station: normal Patient leans: N/A  Psychiatric Specialty Exam: Physical Exam Vitals and nursing note reviewed.  HENT:     Head: Normocephalic.     Nose: Nose normal.     Mouth/Throat:     Pharynx: Oropharynx is clear.  Eyes:     Pupils: Pupils are equal, round, and reactive to light.  Cardiovascular:     Rate and Rhythm: Normal rate.     Pulses: Normal pulses.  Pulmonary:     Effort: Pulmonary effort is normal.  Genitourinary:    Comments: Deferred Musculoskeletal:     Cervical back: Normal range of motion.  Skin:    General: Skin is  warm and dry.  Neurological:     Mental Status: She is alert and oriented to person, place, and time.     Review of Systems  Constitutional: Negative.   HENT: Negative.   Eyes: Negative.   Respiratory: Negative.   Cardiovascular: Negative.   Gastrointestinal: Negative.   Endocrine: Negative.   Genitourinary:  Negative.  Negative for difficulty urinating.  Musculoskeletal: Positive for myalgias.  Allergic/Immunologic: Negative for environmental allergies and food allergies.       Allergies: NKDA  Neurological: Negative for dizziness, tremors, seizures, numbness and headaches.  Psychiatric/Behavioral: Positive for dysphoric mood (Rates #10 (scale 1-10)), self-injury (Hx of), sleep disturbance and suicidal ideas (Hx.of). Negative for agitation, behavioral problems, confusion, decreased concentration and hallucinations. The patient is nervous/anxious (Rates #8 (scale 1-10)). The patient is not hyperactive.     Blood pressure 127/79, pulse 92, temperature 98.1 F (36.7 C), temperature source Oral, resp. rate 16, height 6' (1.829 m), weight 125.6 kg, SpO2 98 %, unknown if currently breastfeeding.Body mass index is 37.57 kg/m.  General Appearance: Casual  Eye Contact: Good  Speech:  Normal Rate  Volume: Normal  Mood:  Still Depressed, but improving some. Rated depression #8, anxiety #7.  Affect: Appropriate & reactive.  Thought Process:  Coherent and Descriptions of Associations: Intact  Orientation:  Full (Time, Place, and Person)  Thought Content:  Logical  Suicidal Thoughts:  Denies any thoughts, plans or intent, able to contract for safety, verbally.  Homicidal Thoughts:  No  Memory:  Immediate; Good Recent;   Fair Remote;   Fair  Judgement:  Intact  Insight:  Fair  Psychomotor Activity:  Psychomotor Retardation  Concentration:  Concentration: Fair and Attention Span: Fair  Recall:  [employer] of Knowledge:  Good  Language:  Good  Akathisia:  Negative  Handed:  Right  AIMS  (if indicated):     Assets:  Desire for Improvement Housing Resilience Talents/Skills Transportation Vocational/Educational  ADL's:  Intact  Cognition:  WNL    Sleep:  Number of Hours: 6   Treatment Plan Summary: Daily contact with patient to assess and evaluate symptoms and progress in treatment and Medication management.  - Continue inpatient hospitalization. - Will continue today 07/11/2020 plan as below except where it is noted.  Depression.    - Continue Duloxetine 30 mg po daily.   Anxiety/agitation.    - Increased gabapentin from 100 mg po tid to gabapentin 200 mg po tid.    - Continue Vistaril 25 mg po tid prn.  Insomnia.     - Increased Trazodone from 50 mg to Trazodone 100 mg po Q hs routinely.  Nicotine withdrawal symptoms,     - Continue the Nicotine patch 21 mg topically Q 24.  Encourage group session participation to learn coping skills. Discharge disposition is ongoing.     Armandina Stammer, NP, PMHNP, FNP-BC 07/11/2020, 1:01 PMPatient ID: [name], female   DOB: 10-03-1991, 29 y.o.   MRN: [id number]

## 2020-07-11 NOTE — Progress Notes (Signed)
During 15 min checks, pt told [facility] that she hadn't slept all night. Pt was given trazodone 50 mg and Vistaril 25 mg this evening. Pt safely maintained on unit.

## 2020-07-12 NOTE — Progress Notes (Signed)
   07/11/20 2351  Psych Admission Type (Psych Patients Only)  Admission Status Voluntary  Psychosocial Assessment  Patient Complaints Depression  Eye Contact Fair  Facial Expression Flat  Affect Appropriate to circumstance  Speech Logical/coherent  Interaction Assertive  Appearance/Hygiene Unremarkable  Behavior Characteristics Cooperative;Appropriate to situation  Mood Depressed  Thought Process  Coherency WDL  Content WDL  Delusions WDL  Perception WDL  Hallucination None reported or observed  Judgment WDL  Confusion WDL  Danger to Self  Current suicidal ideation? Denies  Agreement Not to Harm Self Yes  Danger to Others  Danger to Others None reported or observed  Reports some improvement since admission with her level of depression. Bright tonight and playing cards when not in group.

## 2020-07-12 NOTE — [facility] Group Notes (Signed)
[facility] LCSW Group Therapy Note  Date/Time:    07/12/2020 10:00-11:00AM  Type of Therapy and Topic:  Group Therapy:  Healthy vs Unhealthy Coping Skills  Participation Level:  Did Not Attend   Description of Group:  The focus of this group was to determine what unhealthy coping techniques typically are used by group members and what healthy coping techniques would be helpful in coping with various problems. Patients were guided in becoming aware of the differences between healthy and unhealthy coping techniques.  Patients were asked to identify 1-2 healthy coping skills they would like to learn to use more effectively, and many mentioned meditation, breathing, and relaxation.  At the end of group, additional ideas of healthy coping skills were shared in a fun exercise.  Therapeutic Goals Patients learned that coping is what human beings do all day long to deal with various situations in their lives Patients defined and discussed healthy vs unhealthy coping techniques Patients identified their preferred coping techniques and identified whether these were healthy or unhealthy Patients determined 1-2 healthy coping skills they would like to become more familiar with and use more often Patients provided support and ideas to each other  Summary of Patient Progress: N/A   Therapeutic Modalities Cognitive Behavioral Therapy Motivational Interviewing   Marky Buresh Grossman-Orr, LCSW 07/12/2020, 9:22 AM    

## 2020-07-12 NOTE — [facility] Group Notes (Signed)
Adult Psychoeducational Group Note  Date:  07/12/2020 Time:  9:44 PM  Group Topic/Focus:  Wrap-Up Group:   The focus of this group is to help patients review their daily goal of treatment and discuss progress on daily workbooks.  Participation Level:  Active  Participation Quality:  Appropriate and Attentive  Affect:  Appropriate  Cognitive:  Alert and Appropriate  Insight: Appropriate and Good  Engagement in Group:  Engaged  Modes of Intervention:  Discussion and Education  Additional Comments:  Pt attended and participated in wrap up group this evening and rated their day a 7/10, due to them not sleeping all day. Pt is happy that they handled a difficult phone very well and states this is due to the medication working well. Pt completed their goal, which was to not sleep all day.    Wanda Torres 07/12/2020, 9:44 PM

## 2020-07-12 NOTE — Progress Notes (Signed)
Pt did not attend wrap-up group   

## 2020-07-12 NOTE — Progress Notes (Signed)
[facility] MD Progress Note  07/12/2020 4:19 PM Wanda Torres  MRN:  [id number]  Subjective: Wanda Torres reports, "I'm doing great. I feel good. I was up last night, stayed up for a while in the dayroom, played the [employer] game with the other patients. I was just on the phone with my aunt. She informed me that she did tell my ex that I'm actually in a psychiatric hospital. That did upset me because I don't want it to cause me my children. Other than this, I am doing good. I have no complaints. I'm doing well on the medicines".  Objective: Patient is a 29 year old female with a past psychiatric history significant for depression and anxiety who presented to the [facility] behavioral health hospital with a friend secondary to worsening depression over the last 4 to 6 weeks. The patient stated that she had been treated with Zoloft as well as Vyvanse. She decided to go off medications approximately 4 to 6 weeks ago. Since then she has had worsening depression, and feeling overwhelmed. She began to have thoughts of self-harm, and cut her left ankle. She felt so overwhelmed today that she considered hiring babysitter, and been taking aspirin and drinking excessively hoping to die. She stated she had been treated for depression since approximately age 17.  Wanda Torres is seen, chart reviewed. The chart findings discussed with the treatment team. She awake, visible on the unit. She is making good eye contact. She is verbally responsive. She says she is doing great today. Denies any symptoms of depression or anxiety. She currently denies any SI.  Denies any plans or intent to hurt herself. She is able to verbally contract for safety. She currently denies any HI, AVH, delusional thoughts or paranoia. She does not appear to be responding to any internal stimuli.  She rates her depression today #5 & anxiety #5. She added yesterday for the record, that she was on Vyvanse prior to coming to the hospital. She feels she does not  need it at this time, but plans on resuming it after discharge to be able to concentrate at her work. She says she got upset earlier after her aunt told her the she (aunt) has told her ex that she (patient is in a psychiatric hospital). She says she does not want her psychiatric hospitalization cause her children. However, she says she is not worried about because she feels that she is a good mother. She is in agreement to continue current plan of care as already in progress. She is attending group sessions. No behavioral problems displayed..  Principal Problem: Major depressive disorder, recurrent episode, severe (HCC)  Diagnosis: Principal Problem:   Major depressive disorder, recurrent episode, severe (HCC) Active Problems:   Anxiety and depression  Total Time spent with patient: 25 minutes  Past Psychiatric History:   Past Medical History:  Past Medical History:  Diagnosis Date  . Acne   . Allergy   . Anemia   . Anxiety   . Psoriasis   . Thyroid disease    History reviewed. No pertinent surgical history.  Family History: History reviewed. No pertinent family history.  Family Psychiatric  History: See H&P  Social History:  Social History   Substance and Sexual Activity  Alcohol Use Yes  . Alcohol/week: 0.0 standard drinks     Social History   Substance and Sexual Activity  Drug Use Never    Social History   Socioeconomic History  . Marital status: Single    Spouse name:  Not on file  . Number of children: Not on file  . Years of education: Not on file  . Highest education level: Not on file  Occupational History    Comment: Supervisor reservations; 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[employer]  Tobacco Use  . Smoking status: Never Smoker  . Smokeless tobacco: Never Used  Substance and Sexual Activity  . Alcohol use: Yes    Alcohol/week: 0.0 standard drinks  . Drug use: Never  . Sexual activity: Not on file  Other Topics Concern  . Not on file  Social History Narrative   . Not on file   Social Determinants of Health   Financial Resource Strain:   . Difficulty of Paying Living Expenses:   Food Insecurity:   . Worried About Programme researcher, broadcasting/film/video in the Last Year:   . Barista in the Last Year:   Transportation Needs:   . Freight forwarder (Medical):   Marland Kitchen Lack of Transportation (Non-Medical):   Physical Activity:   . Days of Exercise per Week:   . Minutes of Exercise per Session:   Stress:   . Feeling of Stress :   Social Connections:   . Frequency of Communication with Friends and Family:   . Frequency of Social Gatherings with Friends and Family:   . Attends Religious Services:   . Active Member of Clubs or Organizations:   . Attends Banker Meetings:   Marland Kitchen Marital Status:    Additional Social History:    Pain Medications: See MAR Prescriptions: See MAR Over the Counter: See MAR Withdrawal Symptoms: Nausea / Vomiting  Sleep: Good  Appetite:  Good  Current Medications: Current Facility-Administered Medications  Medication Dose Route Frequency Provider Last Rate Last Admin  . acetaminophen (TYLENOL) tablet 650 mg  650 mg Oral Q6H PRN Anike, Adaku C, NP   650 mg at 07/12/20 1439  . alum & mag hydroxide-simeth (MAALOX/MYLANTA) 200-200-20 MG/5ML suspension 30 mL  30 mL Oral Q4H PRN Anike, Adaku C, NP      . DULoxetine (CYMBALTA) DR capsule 30 mg  30 mg Oral Daily Antonieta Pert, MD   30 mg at 07/12/20 0941  . gabapentin (NEURONTIN) capsule 200 mg  200 mg Oral TID Armandina Stammer I, NP   200 mg at 07/12/20 1334  . hydrOXYzine (ATARAX/VISTARIL) tablet 25 mg  25 mg Oral TID PRN Anike, Adaku C, NP   25 mg at 07/12/20 1334  . magnesium hydroxide (MILK OF MAGNESIA) suspension 30 mL  30 mL Oral Daily PRN Anike, Adaku C, NP      . neomycin-bacitracin-polymyxin (NEOSPORIN) ointment packet   Topical TID Antonieta Pert, MD   Given at 07/12/20 [phone]  . nicotine (NICODERM CQ - dosed in mg/24 hours) patch 21 mg  21 mg Transdermal  Daily Antonieta Pert, MD   21 mg at 07/12/20 0800  . traZODone (DESYREL) tablet 100 mg  100 mg Oral QHS Armandina Stammer I, NP   100 mg at 07/11/20 2122   Lab Results:  No results found for this or any previous visit (from the past 48 hour(s)). Blood Alcohol level:  Lab Results  Component Value Date   ETH <10 07/09/2020   ETH  08/11/2007    <5        LOWEST DETECTABLE LIMIT FOR SERUM ALCOHOL IS 11 mg/dL FOR MEDICAL PURPOSES ONLY   Metabolic Disorder Labs: Lab Results  Component Value Date   HGBA1C 4.4 (L) 07/09/2020  MPG 79.58 07/09/2020   No results found for: PROLACTIN Lab Results  Component Value Date   CHOL 131 07/09/2020   TRIG 143 07/09/2020   HDL 59 07/09/2020   CHOLHDL 2.2 07/09/2020   VLDL 29 07/09/2020   LDLCALC 43 07/09/2020   Physical Findings: AIMS: Facial and Oral Movements Muscles of Facial Expression: None, normal Lips and Perioral Area: None, normal Jaw: None, normal Tongue: None, normal,Extremity Movements Upper (arms, wrists, hands, fingers): None, normal Lower (legs, knees, ankles, toes): None, normal, Trunk Movements Neck, shoulders, hips: None, normal, Overall Severity Severity of abnormal movements (highest score from questions above): None, normal Incapacitation due to abnormal movements: None, normal Patient's awareness of abnormal movements (rate only patient's report): No Awareness, Dental Status Current problems with teeth and/or dentures?: No Does patient usually wear dentures?: No  CIWA:  CIWA-Ar Total: 6 COWS:  COWS Total Score: 6  Musculoskeletal: Strength & Muscle Tone: within normal limits Gait & Station: normal Patient leans: N/A  Psychiatric Specialty Exam: Physical Exam Vitals and nursing note reviewed.  HENT:     Head: Normocephalic.     Nose: Nose normal.     Mouth/Throat:     Pharynx: Oropharynx is clear.  Eyes:     Pupils: Pupils are equal, round, and reactive to light.  Cardiovascular:     Rate and Rhythm:  Normal rate.     Pulses: Normal pulses.  Pulmonary:     Effort: Pulmonary effort is normal.  Genitourinary:    Comments: Deferred Musculoskeletal:     Cervical back: Normal range of motion.  Skin:    General: Skin is warm and dry.  Neurological:     Mental Status: She is alert and oriented to person, place, and time.     Review of Systems  Constitutional: Negative.   HENT: Negative.   Eyes: Negative.   Respiratory: Negative.   Cardiovascular: Negative.   Gastrointestinal: Negative.   Endocrine: Negative.   Genitourinary: Negative.  Negative for difficulty urinating.  Musculoskeletal: Positive for myalgias.  Allergic/Immunologic: Negative for environmental allergies and food allergies.       Allergies: NKDA  Neurological: Negative for dizziness, tremors, seizures, numbness and headaches.  Psychiatric/Behavioral: Positive for dysphoric mood (Rates #10 (scale 1-10)), self-injury (Hx of), sleep disturbance and suicidal ideas (Hx.of). Negative for agitation, behavioral problems, confusion, decreased concentration and hallucinations. The patient is nervous/anxious (Rates #8 (scale 1-10)). The patient is not hyperactive.     Blood pressure 133/86, pulse (!) 108, temperature 97.8 F (36.6 C), temperature source Oral, resp. rate 16, height 6' (1.829 m), weight 125.6 kg, SpO2 98 %, unknown if currently breastfeeding.Body mass index is 37.57 kg/m.  General Appearance: Casual  Eye Contact: Good  Speech:  Normal Rate  Volume: Normal  Mood:  Still Depressed, but improving some. Rated depression #8, anxiety #7.  Affect: Appropriate & reactive.  Thought Process:  Coherent and Descriptions of Associations: Intact  Orientation:  Full (Time, Place, and Person)  Thought Content:  Logical  Suicidal Thoughts:  Denies any thoughts, plans or intent, able to contract for safety, verbally.  Homicidal Thoughts:  No  Memory:  Immediate; Good Recent;   Fair Remote;   Fair  Judgement:  Intact   Insight:  Fair  Psychomotor Activity:  Psychomotor Retardation  Concentration:  Concentration: Fair and Attention Span: Fair  Recall:  Fair  Fund of Knowledge:  Good  Language:  Good  Akathisia:  Negative  Handed:  Right  AIMS (if indicated):  Assets:  Desire for Improvement Housing Resilience Talents/Skills Transportation Vocational/Educational  ADL's:  Intact  Cognition:  WNL    Sleep:  Number of Hours: 6.25   Treatment Plan Summary: Daily contact with patient to assess and evaluate symptoms and progress in treatment and Medication management.  - Continue inpatient hospitalization. - Will continue today 07/12/2020 plan as below except where it is noted.  Depression.    - Continue Duloxetine 30 mg po daily.   Anxiety/agitation.    - Continue gabapentin 200 mg po tid.    - Continue Vistaril 25 mg po tid prn.  Insomnia.     - Trazodone 100 mg po Q hs routinely.  Nicotine withdrawal symptoms,     - Continue the Nicotine patch 21 mg topically Q 24.  Encourage group session participation to learn coping skills. Discharge disposition is ongoing.     Armandina StammerAgnes Daniah Zaldivar, NP, PMHNP, FNP-BC 07/12/2020, 4:19 PMPatient ID: Wanda Torres, female   DOB: 05-15-1991, 29 y.o.   MRN: [id number] Patient ID: Wanda Torres, female   DOB: 05-15-1991, 29 y.o.   MRN: [id number]

## 2020-07-12 NOTE — Plan of Care (Signed)
Patient is active in the milieu. Pleasant and and cooperative. Alert and oriented and denying thoughts of self harm. Denying hallucinations. Reports that her appetite is improving, that she is sleeping well. No major concern so far. Support and encouragements provided. Safety monitored as expected.

## 2020-07-13 NOTE — Progress Notes (Signed)
[place] CORONAV[place] (COVID-19) DAILY CHECK-OFF SYMPTOMS - answer yes or no to each - every day NO YES  Have you had a fever in the past 24 hours?  . Fever (Temp > 37.80C / 100F) X   Have you had any of these symptoms in the past 24 hours? . New Cough .  Sore Throat  .  Shortness of Breath .  Difficulty Breathing .  Unexplained Body Aches   X   Have you had any one of these symptoms in the past 24 hours not related to allergies?   . Runny Nose .  Nasal Congestion .  Sneezing   X   If you have had runny nose, nasal congestion, sneezing in the past 24 hours, has it worsened?  X   EXPOSURES - check yes or no X   Have you traveled outside the state in the past 14 days?  X   Have you been in contact with someone with a confirmed diagnosis of COVID-19 or PUI in the past 14 days without wearing appropriate PPE?  X   Have you been living in the same home as a person with confirmed diagnosis of COVID-19 or a PUI (household contact)?    X   Have you been diagnosed with COVID-19?    X              What to do next: Answered NO to all: Answered YES to anything:   Proceed with unit schedule Follow the [facility] Inpatient Flowsheet.   Lanaiya Lantry K. Bradyn Vassey MSN, RN, [facility] [phone] 

## 2020-07-13 NOTE — Progress Notes (Signed)
[facility] Group Notes:  (Nursing/MHT/Case Management/Adjunct)  Date:  07/13/2020  Time: 2000 Type of Therapy:  wrap up group  Participation Level:  Active  Participation Quality:  Appropriate, Attentive, Sharing and Supportive  Affect:  Appropriate  Cognitive:  Appropriate  Insight:  Improving  Engagement in Group:  Engaged  Modes of Intervention:  Clarification, Education and Support  Summary of Progress/Problems: Positive thinking and self-care were discussed.   Marcille Buffy 07/13/2020, 9:49 PM

## 2020-07-13 NOTE — Progress Notes (Signed)
   07/13/20 0018  Psych Admission Type (Psych Patients Only)  Admission Status Voluntary  Psychosocial Assessment  Patient Complaints Anxiety  Eye Contact Fair  Facial Expression Anxious  Affect Apprehensive  Speech Logical/coherent  Interaction Assertive  Motor Activity Slow  Appearance/Hygiene Unremarkable  Behavior Characteristics Appropriate to situation  Mood Depressed  Thought Process  Coherency WDL  Content WDL  Delusions None reported or observed  Perception WDL  Hallucination None reported or observed  Judgment WDL  Confusion None  Danger to Self  Current suicidal ideation? Denies  Agreement Not to Harm Self Yes  Description of Agreement verbal  Danger to Others  Danger to Others None reported or observed

## 2020-07-13 NOTE — Progress Notes (Signed)
   07/13/20 0016  COVID-19 Daily Checkoff  Have you had a fever (temp > 37.80C/100F)  in the past 24 hours?  No  If you have had runny nose, nasal congestion, sneezing in the past 24 hours, has it worsened? No  COVID-19 EXPOSURE  Have you traveled outside the state in the past 14 days? No  Have you been in contact with someone with a confirmed diagnosis of COVID-19 or PUI in the past 14 days without wearing appropriate PPE? No  Have you been living in the same home as a person with confirmed diagnosis of COVID-19 or a PUI (household contact)? No  Have you been diagnosed with COVID-19? No

## 2020-07-13 NOTE — Progress Notes (Signed)
[facility] MD Progress Note  07/13/2020 3:14 PM Wanda Torres  MRN:  [id number]  Subjective: Wanda Torres reports, "I'm doing great. Ready to be discharged, hopefully tomorrow morning".  Objective: Patient is a 29 year old female with a past psychiatric history significant for depression and anxiety who presented to the [facility] behavioral health hospital with a friend secondary to worsening depression over the last 4 to 6 weeks. The patient stated that she had been treated with Zoloft as well as Vyvanse. She decided to go off medications approximately 4 to 6 weeks ago. Since then she has had worsening depression, and feeling overwhelmed. She began to have thoughts of self-harm, and cut her left ankle. She felt so overwhelmed today that she considered hiring babysitter, and been taking aspirin and drinking excessively hoping to die. She stated she had been treated for depression since approximately age 29.  Wanda Torres is seen, chart reviewed. The chart findings discussed with the treatment team. She awake, visible on the unit. She is making good eye contact. She is verbally responsive. She says she is doing great today. Denies any symptoms of depression or anxiety. She currently denies any SI.  Denies any plans or intent to hurt herself. She is able to verbally contract for safety. She currently denies any HI, AVH, delusional thoughts or paranoia. She does not appear to be responding to any internal stimuli.  She rates her depression today #1 & anxiety #2. She previously added for the record, that she was on Vyvanse prior to coming to the hospital. She feels she does not need it at this time, but plans on resuming it after discharge to be able to concentrate at her work. She says after discharge, she will just have a full day to herself, get herself together, then go get her children. She is in agreement to continue current plan of care as already in progress. She is attending group sessions. No behavioral  problems displayed..  Principal Problem: Major depressive disorder, recurrent episode, severe (HCC)  Diagnosis: Principal Problem:   Major depressive disorder, recurrent episode, severe (HCC) Active Problems:   Anxiety and depression  Total Time spent with patient: 25 minutes  Past Psychiatric History:   Past Medical History:  Past Medical History:  Diagnosis Date  . Acne   . Allergy   . Anemia   . Anxiety   . Psoriasis   . Thyroid disease    History reviewed. No pertinent surgical history.  Family History: History reviewed. No pertinent family history.  Family Psychiatric  History: See H&P  Social History:  Social History   Substance and Sexual Activity  Alcohol Use Yes  . Alcohol/week: 0.0 standard drinks     Social History   Substance and Sexual Activity  Drug Use Never    Social History   Socioeconomic History  . Marital status: Single    Spouse name: Not on file  . Number of children: Not on file  . Years of education: Not on file  . Highest education level: Not on file  Occupational History    Comment: Supervisor reservations; 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[employer]  Tobacco Use  . Smoking status: Never Smoker  . Smokeless tobacco: Never Used  Substance and Sexual Activity  . Alcohol use: Yes    Alcohol/week: 0.0 standard drinks  . Drug use: Never  . Sexual activity: Not on file  Other Topics Concern  . Not on file  Social History Narrative  . Not on file   Social Determinants of  Health   Financial Resource Strain:   . Difficulty of Paying Living Expenses:   Food Insecurity:   . Worried About Programme researcher, broadcasting/film/video in the Last Year:   . Barista in the Last Year:   Transportation Needs:   . Freight forwarder (Medical):   Marland Kitchen Lack of Transportation (Non-Medical):   Physical Activity:   . Days of Exercise per Week:   . Minutes of Exercise per Session:   Stress:   . Feeling of Stress :   Social Connections:   . Frequency of Communication  with Friends and Family:   . Frequency of Social Gatherings with Friends and Family:   . Attends Religious Services:   . Active Member of Clubs or Organizations:   . Attends Banker Meetings:   Marland Kitchen Marital Status:    Additional Social History:    Pain Medications: See MAR Prescriptions: See MAR Over the Counter: See MAR Withdrawal Symptoms: Nausea / Vomiting  Sleep: Good  Appetite:  Good  Current Medications: Current Facility-Administered Medications  Medication Dose Route Frequency Provider Last Rate Last Admin  . acetaminophen (TYLENOL) tablet 650 mg  650 mg Oral Q6H PRN Anike, Adaku C, NP   650 mg at 07/13/20 1409  . alum & mag hydroxide-simeth (MAALOX/MYLANTA) 200-200-20 MG/5ML suspension 30 mL  30 mL Oral Q4H PRN Anike, Adaku C, NP      . DULoxetine (CYMBALTA) DR capsule 30 mg  30 mg Oral Daily Antonieta Pert, MD   30 mg at 07/13/20 0810  . gabapentin (NEURONTIN) capsule 200 mg  200 mg Oral TID Armandina Stammer I, NP   200 mg at 07/13/20 1256  . hydrOXYzine (ATARAX/VISTARIL) tablet 25 mg  25 mg Oral TID PRN Anike, Adaku C, NP   25 mg at 07/13/20 1256  . magnesium hydroxide (MILK OF MAGNESIA) suspension 30 mL  30 mL Oral Daily PRN Anike, Adaku C, NP      . neomycin-bacitracin-polymyxin (NEOSPORIN) ointment packet   Topical TID Antonieta Pert, MD   Given at 07/12/20 [phone]  . nicotine (NICODERM CQ - dosed in mg/24 hours) patch 21 mg  21 mg Transdermal Daily Antonieta Pert, MD   21 mg at 07/13/20 0830  . traZODone (DESYREL) tablet 100 mg  100 mg Oral QHS Armandina Stammer I, NP   100 mg at 07/12/20 2115   Lab Results:  No results found for this or any previous visit (from the past 48 hour(s)). Blood Alcohol level:  Lab Results  Component Value Date   ETH <10 07/09/2020   ETH  08/11/2007    <5        LOWEST DETECTABLE LIMIT FOR SERUM ALCOHOL IS 11 mg/dL FOR MEDICAL PURPOSES ONLY   Metabolic Disorder Labs: Lab Results  Component Value Date   HGBA1C 4.4 (L)  07/09/2020   MPG 79.58 07/09/2020   No results found for: PROLACTIN Lab Results  Component Value Date   CHOL 131 07/09/2020   TRIG 143 07/09/2020   HDL 59 07/09/2020   CHOLHDL 2.2 07/09/2020   VLDL 29 07/09/2020   LDLCALC 43 07/09/2020   Physical Findings: AIMS: Facial and Oral Movements Muscles of Facial Expression: None, normal Lips and Perioral Area: None, normal Jaw: None, normal Tongue: None, normal,Extremity Movements Upper (arms, wrists, hands, fingers): None, normal Lower (legs, knees, ankles, toes): None, normal, Trunk Movements Neck, shoulders, hips: None, normal, Overall Severity Severity of abnormal movements (highest score from questions above): None,  normal Incapacitation due to abnormal movements: None, normal Patient's awareness of abnormal movements (rate only patient's report): No Awareness, Dental Status Current problems with teeth and/or dentures?: No Does patient usually wear dentures?: No  CIWA:  CIWA-Ar Total: 6 COWS:  COWS Total Score: 6  Musculoskeletal: Strength & Muscle Tone: within normal limits Gait & Station: normal Patient leans: N/A  Psychiatric Specialty Exam: Physical Exam Vitals and nursing note reviewed.  HENT:     Head: Normocephalic.     Nose: Nose normal.     Mouth/Throat:     Pharynx: Oropharynx is clear.  Eyes:     Pupils: Pupils are equal, round, and reactive to light.  Cardiovascular:     Rate and Rhythm: Normal rate.     Pulses: Normal pulses.  Pulmonary:     Effort: Pulmonary effort is normal.  Genitourinary:    Comments: Deferred Musculoskeletal:     Cervical back: Normal range of motion.  Skin:    General: Skin is warm and dry.  Neurological:     Mental Status: She is alert and oriented to person, place, and time.     Review of Systems  Constitutional: Negative.   HENT: Negative.   Eyes: Negative.   Respiratory: Negative.   Cardiovascular: Negative.   Gastrointestinal: Negative.   Endocrine: Negative.    Genitourinary: Negative.  Negative for difficulty urinating.  Musculoskeletal: Negative for myalgias.  Allergic/Immunologic: Positive for environmental allergies. Negative for food allergies.       Allergies: NKDA  Neurological: Negative for dizziness, tremors, seizures, numbness and headaches.  Psychiatric/Behavioral: Positive for dysphoric mood (Rates #1 (scale 1-10)  "improved"). Negative for agitation, behavioral problems, confusion, decreased concentration, hallucinations, self-injury (Hx of), sleep disturbance and suicidal ideas (Hx.of). The patient is nervous/anxious (Rates #2 (scale 1-10) ). The patient is not hyperactive.     Blood pressure (!) 110/57, pulse (!) 108, temperature (!) 97.5 F (36.4 C), temperature source Oral, resp. rate 16, height 6' (1.829 m), weight 125.6 kg, SpO2 98 %, unknown if currently breastfeeding.Body mass index is 37.57 kg/m.  General Appearance: Casual  Eye Contact: Good  Speech:  Normal Rate  Volume: Normal  Mood:  Still Depressed, but improving some. Rated depression #8, anxiety #7.  Affect: Appropriate & reactive.  Thought Process:  Coherent and Descriptions of Associations: Intact  Orientation:  Full (Time, Place, and Person)  Thought Content:  Logical  Suicidal Thoughts:  Denies any thoughts, plans or intent, able to contract for safety, verbally.  Homicidal Thoughts:  No  Memory:  Immediate; Good Recent;   Fair Remote;   Fair  Judgement:  Intact  Insight:  Fair  Psychomotor Activity:  Psychomotor Retardation  Concentration:  Concentration: Fair and Attention Span: Fair  Recall:  [employer]Fair  Fund of Knowledge:  Good  Language:  Good  Akathisia:  Negative  Handed:  Right  AIMS (if indicated):     Assets:  Desire for Improvement Housing Resilience Talents/Skills Transportation Vocational/Educational  ADL's:  Intact  Cognition:  WNL    Sleep:  Number of Hours: 6.75   Treatment Plan Summary: Daily contact with patient to assess and  evaluate symptoms and progress in treatment and Medication management.  - Continue inpatient hospitalization. - Will continue today 07/13/2020 plan as below except where it is noted.  Depression.    - Continue Duloxetine 30 mg po daily.   Anxiety/agitation.    - Continue gabapentin 200 mg po tid.    - Continue Vistaril 25 mg po  tid prn.  Insomnia.     - Trazodone 100 mg po Q hs routinely.  Nicotine withdrawal symptoms,     - Continue the Nicotine patch 21 mg topically Q 24.  Encourage group session participation to learn coping skills. Discharge disposition is ongoing.     Armandina Stammer, NP, PMHNP, FNP-BC 07/13/2020, 3:14 PMPatient ID: [name], female   DOB: 02/26/1991, 29 y.o.   MRN: [id number] Patient ID: [name], female   DOB: [birth year], 29 y.o.   MRN: [id number] Patient ID: [name], female   DOB: [birth year], 29 y.o.   MRN: [id number]

## 2020-07-13 NOTE — Progress Notes (Signed)
Pt observed in the day room with peers solving a puzzle. Pt stated she is feeling better and looking forward to being discharged to morrow except not sure how is going to be since she is going back to the  real life. Pt reports having had a good day, good appetite. Denies SI/HI and contracted for safety, will continue to monitor.

## 2020-07-13 NOTE — [facility] Group Notes (Signed)
[facility] LCSW Group Therapy Note  Date/Time:  07/13/2020 9:00-10:00 or 10:00-11:00AM  Type of Therapy and Topic:  Group Therapy:  Healthy and Unhealthy Supports  Participation Level:  Active   Description of Group:  Patients in this group were introduced to the idea of adding a variety of healthy supports to address the various needs in their lives.Patients discussed what additional healthy supports could be helpful in their recovery and wellness after discharge in order to prevent future hospitalizations.   An emphasis was placed on using counselor, doctor, therapy groups, 12-step groups, and problem-specific support groups to expand supports.  The group really focused in large part on what has happened to patients when they have neglected themselves in favor of taking care of other people, and how that has not only NOT worked in the past, it will never be in their best interest to ignore their own needs. It was emphasized that, in fact, if a person ignores their own needs, they actually do not have the ability to care for anybody else at all in the long run.  This was discussed at length by various group members, and everyone listened attentively.  There were a lot of tears in group.  Therapeutic Goals:   1)  discuss importance of adding supports to stay well once out of the hospital  2)  compare healthy versus unhealthy supports and identify some examples of each  3)  generate ideas and descriptions of healthy supports that can be added  4)  offer mutual support about how to address unhealthy supports  5)  encourage active participation in and adherence to discharge plan    Summary of Patient Progress:  The patient expressed a willingness to add a variety of support(s) to help in her recovery journey.  She was one of the most vocal members of the group and she was an Agricultural consultant to others throughout.  She showed insight and stated that we were giving her new ways to think about  things.   Therapeutic Modalities:   Motivational Interviewing Brief Solution-Focused Therapy  Ambrose Mantle, LCSW

## 2020-07-13 NOTE — Progress Notes (Signed)
  Pt is a female  of 29 y.o. , DOB [birth year], MRN  [id number]  presents voluntary with MDD,  Hx of cutting.   Patient   reports good appetite, energy normal, and fair sleeping pattern . Pt denies depression , hopelessness , and anxiety.   Denies  SI, HI or AVH.  Pt reports LBM yesterday, Denies pain.  Vitals signs  being monitored. Medication given as Rx, no adverse reaction observed, safety maintained with q15 minute checks.  Einar Crow. Melvyn Neth MSN, RN, [facility] [phone]

## 2020-07-14 MED ORDER — HYDROXYZINE HCL 25 MG PO TABS: 25.0000 mg | ORAL_TABLET | Freq: Three times a day (TID) | ORAL | 0 refills | Status: DC | PRN

## 2020-07-14 MED ORDER — GABAPENTIN 100 MG PO CAPS: 200.0000 mg | ORAL_CAPSULE | Freq: Three times a day (TID) | ORAL | 0 refills | Status: DC

## 2020-07-14 MED ORDER — NICOTINE 21 MG/24HR TD PT24: 21.0000 mg | MEDICATED_PATCH | Freq: Every day | TRANSDERMAL | 0 refills | Status: DC

## 2020-07-14 MED ORDER — DULOXETINE HCL 30 MG PO CPEP: 30.0000 mg | ORAL_CAPSULE | Freq: Every day | ORAL | 0 refills | Status: DC

## 2020-07-14 MED ORDER — BACITRACIN-NEOMYCIN-POLYMYXIN 400-5-5000 EX OINT: TOPICAL_OINTMENT | Freq: Three times a day (TID) | CUTANEOUS | 0 refills | Status: DC

## 2020-07-14 MED ORDER — TRAZODONE HCL 100 MG PO TABS: 100.0000 mg | ORAL_TABLET | Freq: Every day | ORAL | 0 refills | Status: DC

## 2020-07-14 NOTE — Progress Notes (Signed)
RN met with pt and reviewed pt's discharge instructions.  Pt verbalized understanding of discharge instructions and pt did not have any questions. RN reviewed and provided pt with a copy of SRA, AVS and Transition Record.  RN returned pt's belongings to pt. Pt denied SI/HI/AVH and voiced no concerns.  Pt was appreciative of the care pt received at [facility].  Patient discharged to the lobby where pt's friend was waiting to take pt home.

## 2020-07-14 NOTE — [facility] Suicide Risk Assessment (Signed)
[facility] Discharge Suicide Risk Assessment   Principal Problem: Major depressive disorder, recurrent episode, severe (HCC) Discharge Diagnoses: Principal Problem:   Major depressive disorder, recurrent episode, severe (HCC) Active Problems:   Anxiety and depression   Total Time spent with patient: 20 minutes  Musculoskeletal: Strength & Muscle Tone: within normal limits Gait & Station: normal Patient leans: N/A  Psychiatric Specialty Exam: Review of Systems  All other systems reviewed and are negative.   Blood pressure (!) 145/74, pulse (!) 108, temperature (!) 97.5 F (36.4 C), temperature source Oral, resp. rate 16, height 6' (1.829 m), weight 125.6 kg, SpO2 98 %, unknown if currently breastfeeding.Body mass index is 37.57 kg/m.  General Appearance: Casual  Eye Contact::  Good  Speech:  Normal Rate409  Volume:  Normal  Mood:  Euthymic  Affect:  Congruent  Thought Process:  Coherent and Descriptions of Associations: Intact  Orientation:  Full (Time, Place, and Person)  Thought Content:  Logical  Suicidal Thoughts:  No  Homicidal Thoughts:  No  Memory:  Immediate;   Good Recent;   Good Remote;   Good  Judgement:  Intact  Insight:  Good  Psychomotor Activity:  Normal  Concentration:  Good  Recall:  Good  Fund of Knowledge:Good  Language: Good  Akathisia:  Negative  Handed:  Right  AIMS (if indicated):     Assets:  Communication Skills Desire for Improvement Financial Resources/Insurance Housing Resilience Social Support Talents/Skills Transportation Vocational/Educational  Sleep:  Number of Hours: 5.25  Cognition: WNL  ADL's:  Intact   Mental Status Per Nursing Assessment::   On Admission:  Suicidal ideation indicated by patient  Demographic Factors:  Caucasian  Loss Factors: Loss of significant relationship  Historical Factors: Impulsivity  Risk Reduction Factors:   Responsible for children under 33 years of age, Sense of responsibility to family,  Employed, Positive social support and Positive coping skills or problem solving skills  Continued Clinical Symptoms:  Depression:   Impulsivity  Cognitive Features That Contribute To Risk:  None    Suicide Risk:  Minimal: No identifiable suicidal ideation.  Patients presenting with no risk factors but with morbid ruminations; may be classified as minimal risk based on the severity of the depressive symptoms   Follow-up Information    [facility]. Schedule an appointment as soon as possible for a visit.   Why: A referral has been made on your behalf to establish with this provider.  This provider will contact you to schedule an appointment for medication management.  Contact information: [address] [place], [place] [address]  P: [phone]  F: [phone]              Plan Of Care/Follow-up recommendations:  Activity:  ad lib  Antonieta Pert, MD 07/14/2020, 9:35 AM

## 2020-07-14 NOTE — Tx Team (Signed)
Interdisciplinary Treatment and Diagnostic Plan Update  07/14/2020 Time of Session: 9:05am Wanda Torres MRN: [id number]  Principal Diagnosis: Major depressive disorder, recurrent episode, severe (HCC)  Secondary Diagnoses: Principal Problem:   Major depressive disorder, recurrent episode, severe (HCC) Active Problems:   Anxiety and depression   Current Medications:  Current Facility-Administered Medications  Medication Dose Route Frequency Provider Last Rate Last Admin  . acetaminophen (TYLENOL) tablet 650 mg  650 mg Oral Q6H PRN Anike, Adaku C, NP   650 mg at 07/13/20 1409  . alum & mag hydroxide-simeth (MAALOX/MYLANTA) 200-200-20 MG/5ML suspension 30 mL  30 mL Oral Q4H PRN Anike, Adaku C, NP      . DULoxetine (CYMBALTA) DR capsule 30 mg  30 mg Oral Daily Antonieta Pert, MD   30 mg at 07/14/20 0815  . gabapentin (NEURONTIN) capsule 200 mg  200 mg Oral TID Armandina Stammer I, NP   200 mg at 07/14/20 0815  . hydrOXYzine (ATARAX/VISTARIL) tablet 25 mg  25 mg Oral TID PRN Anike, Adaku C, NP   25 mg at 07/14/20 0817  . magnesium hydroxide (MILK OF MAGNESIA) suspension 30 mL  30 mL Oral Daily PRN Anike, Adaku C, NP      . neomycin-bacitracin-polymyxin (NEOSPORIN) ointment packet   Topical TID Antonieta Pert, MD   Given at 07/14/20 0815  . nicotine (NICODERM CQ - dosed in mg/24 hours) patch 21 mg  21 mg Transdermal Daily Antonieta Pert, MD   21 mg at 07/14/20 0817  . traZODone (DESYREL) tablet 100 mg  100 mg Oral QHS Armandina Stammer I, NP   100 mg at 07/13/20 2102   PTA Medications: Medications Prior to Admission  Medication Sig Dispense Refill Last Dose  . levonorgestrel (MIRENA) 20 MCG/24HR IUD 1 Intra Uterine Device by Intrauterine route continuous.   04/16/2019  . sertraline (ZOLOFT) 100 MG tablet Take 100 mg by mouth daily.   06/17/2020 at Unknown time  . amphetamine-dextroamphetamine (ADDERALL XR) 10 MG 24 hr capsule Take 1-2 capsules (10-20 mg total) by mouth daily. (Patient  not taking: Reported on 07/09/2020) 60 capsule 0 Not Taking at Unknown time  . amphetamine-dextroamphetamine (ADDERALL) 10 MG tablet Take 1 tablet (10 mg total) by mouth daily. (Patient not taking: Reported on 07/09/2020) 30 tablet 0 Not Taking at Unknown time  . HYDROcodone-acetaminophen (NORCO/VICODIN) 5-325 MG tablet Take 1 tablet by mouth every 12 (twelve) hours as needed. (Patient not taking: Reported on 07/09/2020) 8 tablet 0 Not Taking at Unknown time  . metoCLOPramide (REGLAN) 10 MG tablet Take 1 tablet (10 mg total) by mouth every 6 (six) hours for 5 days. (Patient not taking: Reported on 07/09/2020) 20 tablet 0 Not Taking  . ondansetron (ZOFRAN) 4 MG tablet Take 1 tablet (4 mg total) by mouth every 6 (six) hours. (Patient not taking: Reported on 07/09/2020) 12 tablet 0 Not Taking at Unknown time    Patient Stressors: Marital or family conflict Medication change or noncompliance Substance abuse Traumatic event  Patient Strengths: Ability for insight Capable of independent living Communication skills Supportive family/friends Work skills  Treatment Modalities: Medication Management, Group therapy, Case management,  1 to 1 session with clinician, Psychoeducation, Recreational therapy.   Physician Treatment Plan for Primary Diagnosis: Major depressive disorder, recurrent episode, severe (HCC) Long Term Goal(s): Improvement in symptoms so as ready for discharge Improvement in symptoms so as ready for discharge   Short Term Goals: Ability to identify changes in lifestyle to reduce recurrence of condition will  improve Ability to verbalize feelings will improve Ability to disclose and discuss suicidal ideas Ability to demonstrate self-control will improve Ability to identify and develop effective coping behaviors will improve Ability to maintain clinical measurements within normal limits will improve Compliance with prescribed medications will improve Ability to identify changes in  lifestyle to reduce recurrence of condition will improve Ability to verbalize feelings will improve Ability to disclose and discuss suicidal ideas Ability to demonstrate self-control will improve Ability to identify and develop effective coping behaviors will improve Ability to maintain clinical measurements within normal limits will improve Compliance with prescribed medications will improve  Medication Management: Evaluate patient's response, side effects, and tolerance of medication regimen.  Therapeutic Interventions: 1 to 1 sessions, Unit Group sessions and Medication administration.  Evaluation of Outcomes: Adequate for Discharge  Physician Treatment Plan for Secondary Diagnosis: Principal Problem:   Major depressive disorder, recurrent episode, severe (HCC) Active Problems:   Anxiety and depression  Long Term Goal(s): Improvement in symptoms so as ready for discharge Improvement in symptoms so as ready for discharge   Short Term Goals: Ability to identify changes in lifestyle to reduce recurrence of condition will improve Ability to verbalize feelings will improve Ability to disclose and discuss suicidal ideas Ability to demonstrate self-control will improve Ability to identify and develop effective coping behaviors will improve Ability to maintain clinical measurements within normal limits will improve Compliance with prescribed medications will improve Ability to identify changes in lifestyle to reduce recurrence of condition will improve Ability to verbalize feelings will improve Ability to disclose and discuss suicidal ideas Ability to demonstrate self-control will improve Ability to identify and develop effective coping behaviors will improve Ability to maintain clinical measurements within normal limits will improve Compliance with prescribed medications will improve     Medication Management: Evaluate patient's response, side effects, and tolerance of medication  regimen.  Therapeutic Interventions: 1 to 1 sessions, Unit Group sessions and Medication administration.  Evaluation of Outcomes: Adequate for Discharge   RN Treatment Plan for Primary Diagnosis: Major depressive disorder, recurrent episode, severe (HCC) Long Term Goal(s): Knowledge of disease and therapeutic regimen to maintain health will improve  Short Term Goals: Ability to remain free from injury will improve, Ability to identify and develop effective coping behaviors will improve and Compliance with prescribed medications will improve  Medication Management: RN will administer medications as ordered by provider, will assess and evaluate patient's response and provide education to patient for prescribed medication. RN will report any adverse and/or side effects to prescribing provider.  Therapeutic Interventions: 1 on 1 counseling sessions, Psychoeducation, Medication administration, Evaluate responses to treatment, Monitor vital signs and CBGs as ordered, Perform/monitor CIWA, COWS, AIMS and Fall Risk screenings as ordered, Perform wound care treatments as ordered.  Evaluation of Outcomes: Adequate for Discharge   LCSW Treatment Plan for Primary Diagnosis: Major depressive disorder, recurrent episode, severe (HCC) Long Term Goal(s): Safe transition to appropriate next level of care at discharge, Engage patient in therapeutic group addressing interpersonal concerns.  Short Term Goals: Engage patient in aftercare planning with referrals and resources, Increase social support and Increase skills for wellness and recovery  Therapeutic Interventions: Assess for all discharge needs, 1 to 1 time with Social worker, Explore available resources and support systems, Assess for adequacy in community support network, Educate family and significant other(s) on suicide prevention, Complete Psychosocial Assessment, Interpersonal group therapy.  Evaluation of Outcomes: Adequate for  Discharge   Progress in Treatment: Attending groups: Yes. Participating in groups:  Yes. Taking medication as prescribed: Yes. Toleration medication: Yes. Family/Significant other contact made: No, will contact:  if consent is provided. Patient understands diagnosis: Yes. Discussing patient identified problems/goals with staff: Yes. Medical problems stabilized or resolved: Yes. Denies suicidal/homicidal ideation: Yes. Issues/concerns per patient self-inventory: No.   New problem(s) identified: No, Describe:  CSW will continue to assess.  New Short Term/Long Term Goal(s): medication stabilization, elimination of SI thoughts, development of comprehensive mental wellness plan.   Patient Goals:  "To get my medicine straight and to not have suicidal thoughts"  Discharge Plan or Barriers: Patient recently admitted. CSW will continue to follow and assess for appropriate referrals and possible discharge planning.   Reason for Continuation of Hospitalization: Medication stabilization  Estimated Length of Stay: 1-3 days   Attendees: Patient:  07/09/2020   Physician:  07/09/2020   Nursing:  07/09/2020   RN Care Manager: 07/09/2020   Social Worker: Ruthann Cancer, LCSWA 07/09/2020   Recreational Therapist:  07/09/2020   Other:  07/09/2020   Other:  07/09/2020   Other: 07/09/2020      Scribe for Treatment Team: Otelia Santee, LCSWA 07/14/2020 11:36 AM

## 2020-07-14 NOTE — Progress Notes (Signed)
Spiritual care group on grief and loss facilitated by chaplain Burnis Kingfisher  Group Goal:  Support / Education around grief and loss  Members engage in facilitated group support and psycho-social education.  Group Description:  Following introductions and group rules, group members engaged in facilitated group dialog and support around topic of loss, with particular support around experiences of loss in their lives. Group Identified types of loss (relationships / self / things) and identified patterns, circumstances, and changes that precipitate losses. Reflected on thoughts / feelings around loss, normalized grief responses, and recognized variety in grief experience.  Patient Progress: Present throughout group until discharge at end of group time.  Wanda Torres was actively engaged in group dialog - speaking about her awareness of of and normalization of grief around a loss in her past.  She advocated for working with grief counseling outside of hospital.  Noted with group that she recognized grieving loss of self on arriving at [facility] - processed being "graceful" to herself and being open to continued support on d/c.

## 2020-07-14 NOTE — Progress Notes (Signed)
Recreation Therapy Notes  Date:  7.19.20 Time: 0945 Location: 300 Hall Group Room  Group Topic: Stress Management  Goal Area(s) Addresses:  Patient will identify positive stress management techniques. Patient will identify benefits of using stress management post d/c.  Behavioral Response: Engaged  Intervention: Stress Management   Activity : Meditation.  LRT played a meditation that focused on being resilient in the face of adversity.  Patients were to listen to meditation as it played to engage in activity.   Education:  Stress Management, Discharge Planning.   Education Outcome: Acknowledges Education  Clinical Observations/Feedback: Pt attended and participated in activity.    Caroll Rancher, LRT/CTRS         Lillia Abed, Joell Buerger A 07/14/2020 11:21 AM

## 2020-07-14 NOTE — Discharge Summary (Signed)
Physician Discharge Summary Note  Patient:  Wanda Torres is an 29 y.o., female MRN:  [id number] DOB:  January 17, 1991 Patient phone:  [phone] (home)  Patient address:   [address] [place] [id number],  Total Time spent with patient: 15 minutes  Date of Admission:  07/09/2020 Date of Discharge: 07/15/20  Reason for Admission:  suicidal ideation  Principal Problem: Major depressive disorder, recurrent episode, severe (HCC) Discharge Diagnoses: Principal Problem:   Major depressive disorder, recurrent episode, severe (HCC) Active Problems:   Anxiety and depression   Past Psychiatric History: Patient had 1 previous psychiatric admission is now less than.  She has been on Zoloft since then.  It has been titrated to 150 mg p.o. daily over the years.  She has been on Adderall extended release but it led to side effects for her attention deficit disorder.  She stated she does better on Vyvanse and it does not disrupt her sleep.  She stopped taking both these medications 4 to 6 weeks ago.  Past Medical History:  Past Medical History:  Diagnosis Date  . Acne   . Allergy   . Anemia   . Anxiety   . Psoriasis   . Thyroid disease    History reviewed. No pertinent surgical history. Family History: History reviewed. No pertinent family history. Family Psychiatric  History: She has a family history of an aunt who had a suicide attempt.  She stated she had several members of her family with depression and anxiety. Social History:  Social History   Substance and Sexual Activity  Alcohol Use Yes  . Alcohol/week: 0.0 standard drinks     Social History   Substance and Sexual Activity  Drug Use Never    Social History   Socioeconomic History  . Marital status: Single    Spouse name: Not on file  . Number of children: Not on file  . Years of education: Not on file  . Highest education level: Not on file  Occupational History    Comment: Supervisor reservations; call  center [employer]  Tobacco Use  . Smoking status: Never Smoker  . Smokeless tobacco: Never Used  Substance and Sexual Activity  . Alcohol use: Yes    Alcohol/week: 0.0 standard drinks  . Drug use: Never  . Sexual activity: Not on file  Other Topics Concern  . Not on file  Social History Narrative  . Not on file   Social Determinants of Health   Financial Resource Strain:   . Difficulty of Paying Living Expenses:   Food Insecurity:   . Worried About Programme researcher, broadcasting/film/videounning Out of Food in the Last Year:   . Baristaan Out of Food in the Last Year:   Transportation Needs:   . Freight forwarderLack of Transportation (Medical):   Marland Kitchen. Lack of Transportation (Non-Medical):   Physical Activity:   . Days of Exercise per Week:   . Minutes of Exercise per Session:   Stress:   . Feeling of Stress :   Social Connections:   . Frequency of Communication with Friends and Family:   . Frequency of Social Gatherings with Friends and Family:   . Attends Religious Services:   . Active Member of Clubs or Organizations:   . Attends BankerClub or Organization Meetings:   Marland Kitchen. Marital Status:     Hospital Course:  From admission H&P: Patient is a 29 year old female with a past psychiatric history significant for depression and anxiety who [facility]  with a friend secondary to worsening depression over the last 4 to 6 weeks. The patient stated that she had been treated with Zoloft as well as Vyvanse. She decided to go off medications approximately 4 to 6 weeks ago. Since then she has had worsening depression, and feeling overwhelmed. She began to have thoughts of self-harm, and cut her left ankle. She felt so overwhelmed today that she considered hiring babysitter, and been taking aspirin and drinking excessively hoping to die. She stated she had been treated for depression since approximately age 38. That is when she is essentially started on Zoloft. Over the years she is done well with this,  and the dosage has been increased to approximately 150 mg p.o. daily. Towards the end of taking the Zoloft she does not feel as though it was making things much better, but things have certainly gotten much worse. She has 3 children and does have a significant other, but the significant other recently told her that they were transitioning to female. This again has caused a great deal of emotional distress and her. She admitted to helplessness, hopelessness and worthlessness. She admitted to suicidal ideation this morning. The decision was made to admit her to the hospital for evaluation and stabilization.  Ms. Langstaff was admitted for depression with suicidal ideation. She remained on the [facility] unit for six days. She was started on Cymbalta, Neurontin, PRN Vistaril and PRN trazodone. She participated in group therapy on the unit. She responded well to treatment with no adverse effects reported. She has shown improved mood, affect, sleep, and interaction. She denies any SI/HI/AVH and contracts for safety. She is discharging on the medications listed below. She agrees to follow up at [facility] (see below). Patient is provided with prescriptions for medications upon discharge. Her friend is picking her up for discharge home.  Physical Findings: AIMS: Facial and Oral Movements Muscles of Facial Expression: None, normal Lips and Perioral Area: None, normal Jaw: None, normal Tongue: None, normal,Extremity Movements Upper (arms, wrists, hands, fingers): None, normal Lower (legs, knees, ankles, toes): None, normal, Trunk Movements Neck, shoulders, hips: None, normal, Overall Severity Severity of abnormal movements (highest score from questions above): None, normal Incapacitation due to abnormal movements: None, normal Patient's awareness of abnormal movements (rate only patient's report): No Awareness, Dental Status Current problems with teeth and/or dentures?: No Does patient usually wear  dentures?: No  CIWA:  CIWA-Ar Total: 6 COWS:  COWS Total Score: 6  Musculoskeletal: Strength & Muscle Tone: within normal limits Gait & Station: normal Patient leans: N/A  Psychiatric Specialty Exam: Physical Exam Vitals and nursing note reviewed.  Constitutional:      Appearance: She is well-developed.  Cardiovascular:     Rate and Rhythm: Normal rate.  Pulmonary:     Effort: Pulmonary effort is normal.  Neurological:     Mental Status: She is alert and oriented to person, place, and time.     Review of Systems  Constitutional: Negative.   Respiratory: Negative for cough and shortness of breath.   Psychiatric/Behavioral: Negative for agitation, behavioral problems, confusion, dysphoric mood, hallucinations, self-injury, sleep disturbance and suicidal ideas. The patient is not nervous/anxious and is not hyperactive.     Blood pressure (!) 145/74, pulse (!) 108, temperature (!) 97.5 F (36.4 C), temperature source Oral, resp. rate 16, height 6' (1.829 m), weight 125.6 kg, SpO2 98 %, unknown if currently breastfeeding.Body mass index is 37.57 kg/m.  See MD's discharge SRA     Have  you used any form of tobacco in the last 30 days? (Cigarettes, Smokeless Tobacco, Cigars, and/or Pipes): Yes  Has this patient used any form of tobacco in the last 30 days? (Cigarettes, Smokeless Tobacco, Cigars, and/or Pipes) Yes, a prescription for an FDA-approved medication for tobacco cessation was offered at discharge.  Blood Alcohol level:  Lab Results  Component Value Date   ETH <10 07/09/2020   ETH  08/11/2007    <5        LOWEST DETECTABLE LIMIT FOR SERUM ALCOHOL IS 11 mg/dL FOR MEDICAL PURPOSES ONLY    Metabolic Disorder Labs:  Lab Results  Component Value Date   HGBA1C 4.4 (L) 07/09/2020   MPG 79.58 07/09/2020   No results found for: PROLACTIN Lab Results  Component Value Date   CHOL 131 07/09/2020   TRIG 143 07/09/2020   HDL 59 07/09/2020   CHOLHDL 2.2 07/09/2020   VLDL  29 07/09/2020   LDLCALC 43 07/09/2020    See Psychiatric Specialty Exam and Suicide Risk Assessment completed by Attending Physician prior to discharge.  Discharge destination:  Home  Is patient on multiple antipsychotic therapies at discharge:  No   Has Patient had three or more failed trials of antipsychotic monotherapy by history:  No  Recommended Plan for Multiple Antipsychotic Therapies: NA  Discharge Instructions    Discharge instructions   Complete by: As directed    Patient is instructed to take all prescribed medications as recommended. Report any side effects or adverse reactions to your outpatient psychiatrist. Patient is instructed to abstain from alcohol and illegal drugs while on prescription medications. In the event of worsening symptoms, patient is instructed to call the crisis hotline, 911, or go to the nearest emergency department for evaluation and treatment.   No wound care   Complete by: As directed      Allergies as of 07/14/2020   No Known Allergies     Medication List    STOP taking these medications   amphetamine-dextroamphetamine 10 MG 24 hr capsule Commonly known as: Adderall XR   amphetamine-dextroamphetamine 10 MG tablet Commonly known as: ADDERALL   HYDROcodone-acetaminophen 5-325 MG tablet Commonly known as: NORCO/VICODIN   levonorgestrel 20 MCG/24HR IUD Commonly known as: MIRENA   metoCLOPramide 10 MG tablet Commonly known as: REGLAN   ondansetron 4 MG tablet Commonly known as: ZOFRAN   sertraline 100 MG tablet Commonly known as: ZOLOFT     TAKE these medications     Indication  DULoxetine 30 MG capsule Commonly known as: CYMBALTA Take 1 capsule (30 mg total) by mouth daily.  Indication: Major Depressive Disorder   gabapentin 100 MG capsule Commonly known as: NEURONTIN Take 2 capsules (200 mg total) by mouth 3 (three) times daily.  Indication: Neuropathic Pain, Agitation   hydrOXYzine 25 MG tablet Commonly known as:  ATARAX/VISTARIL Take 1 tablet (25 mg total) by mouth 3 (three) times daily as needed for anxiety.  Indication: Feeling Anxious   neomycin-bacitracin-polymyxin ointment Commonly known as: NEOSPORIN Apply topically 3 (three) times daily.  Indication: Wound   nicotine 21 mg/24hr patch Commonly known as: NICODERM CQ - dosed in mg/24 hours Place 1 patch (21 mg total) onto the skin daily.  Indication: Nicotine Addiction   traZODone 100 MG tablet Commonly known as: DESYREL Take 1 tablet (100 mg total) by mouth at bedtime.  Indication: Trouble Sleeping       Follow-up Information    [facility]. Schedule an appointment as soon as possible for  a visit.   Why: A referral has been made on your behalf to establish with this provider.  This provider will contact you to schedule an appointment for medication management.  Contact information: [address] [place], [place] [address]  P: [phone]  F: [phone]              Follow-up recommendations: Activity as tolerated. Diet as recommended by primary care physician. Keep all scheduled follow-up appointments as recommended.   Comments:   Patient is instructed to take all prescribed medications as recommended. Report any side effects or adverse reactions to your outpatient psychiatrist. Patient is instructed to abstain from alcohol and illegal drugs while on prescription medications. In the event of worsening symptoms, patient is instructed to call the crisis hotline, 911, or go to the nearest emergency department for evaluation and treatment.  Signed: Aldean Baker, NP 07/15/2020, 8:15 AM

## 2020-08-12 ENCOUNTER — Telehealth (HOSPITAL_COMMUNITY): Payer: Self-pay | Admitting: Licensed Clinical Social Worker

## 2020-08-15 ENCOUNTER — Other Ambulatory Visit: Payer: Self-pay

## 2020-08-15 ENCOUNTER — Ambulatory Visit (HOSPITAL_COMMUNITY)
Admission: EM | Admit: 2020-08-15 | Discharge: 2020-08-15 | Disposition: A | Discharge status: Home / Self Care | Payer: Medicaid Other | Attending: Behavioral Health | Admitting: Behavioral Health

## 2020-08-15 MED ORDER — HYDROXYZINE HCL 25 MG PO TABS: 25.0000 mg | ORAL_TABLET | Freq: Three times a day (TID) | ORAL | 0 refills | Status: DC | PRN

## 2020-08-15 MED ORDER — NICOTINE 21 MG/24HR TD PT24: 21.0000 mg | MEDICATED_PATCH | Freq: Every day | TRANSDERMAL | 0 refills | Status: DC

## 2020-08-15 MED ORDER — DULOXETINE HCL 30 MG PO CPEP: 30.0000 mg | ORAL_CAPSULE | Freq: Every day | ORAL | 0 refills | Status: DC

## 2020-08-15 MED ORDER — GABAPENTIN 100 MG PO CAPS: 200.0000 mg | ORAL_CAPSULE | Freq: Three times a day (TID) | ORAL | 0 refills | Status: DC

## 2020-08-15 MED ORDER — BACITRACIN-NEOMYCIN-POLYMYXIN 400-5-5000 EX OINT: TOPICAL_OINTMENT | Freq: Three times a day (TID) | CUTANEOUS | 0 refills | Status: DC

## 2020-08-15 MED ORDER — TRAZODONE HCL 100 MG PO TABS: 100.0000 mg | ORAL_TABLET | Freq: Every day | ORAL | 0 refills | Status: DC

## 2020-08-15 NOTE — ED Notes (Signed)
Pt cleared to leave per Triage

## 2020-08-26 ENCOUNTER — Telehealth (INDEPENDENT_AMBULATORY_CARE_PROVIDER_SITE_OTHER): Payer: Medicaid Other | Admitting: Psychiatry

## 2020-08-26 ENCOUNTER — Other Ambulatory Visit: Payer: Self-pay

## 2020-08-26 ENCOUNTER — Encounter (HOSPITAL_COMMUNITY): Payer: Self-pay | Admitting: Psychiatry

## 2020-08-26 DIAGNOSIS — F900 Attention-deficit hyperactivity disorder, predominantly inattentive type: Secondary | ICD-10-CM | POA: Diagnosis not present

## 2020-08-26 DIAGNOSIS — F3341 Major depressive disorder, recurrent, in partial remission: Secondary | ICD-10-CM | POA: Diagnosis not present

## 2020-08-26 MED ORDER — GABAPENTIN 300 MG PO CAPS: 300.0000 mg | ORAL_CAPSULE | Freq: Three times a day (TID) | ORAL | 1 refills | Status: DC

## 2020-08-26 MED ORDER — LISDEXAMFETAMINE DIMESYLATE 50 MG PO CAPS: 50.0000 mg | ORAL_CAPSULE | Freq: Every day | ORAL | 0 refills | Status: DC

## 2020-08-26 MED ORDER — DULOXETINE HCL 30 MG PO CPEP: 30.0000 mg | ORAL_CAPSULE | Freq: Two times a day (BID) | ORAL | 1 refills | Status: DC

## 2020-08-26 NOTE — Progress Notes (Signed)
Psychiatric Initial Adult Assessment   Patient Identification: Wanda Torres MRN:  [id number] Date of Evaluation:  08/26/2020   Referral Source: [facility]  Chief Complaint:" It has been hard to find a psychiatrist."     Visit Diagnosis:    ICD-10-CM   1. Recurrent major depressive disorder, in partial remission (HCC)  F33.41 DULoxetine (CYMBALTA) 30 MG capsule    gabapentin (NEURONTIN) 300 MG capsule  2. Attention deficit hyperactivity disorder (ADHD), predominantly inattentive type  F90.0 lisdexamfetamine (VYVANSE) 50 MG capsule    lisdexamfetamine (VYVANSE) 50 MG capsule    History of Present Illness: This is a 29 year old female with history of MDD, ADHD, anxiety who was seen for establishing care.  Patient reported that she had been seeing a psychiatrist in [place] with [facility] for the past few years.  However she had a hard time getting an appointment to see them for follow-up. She was recently hospitalized at [facility] from July 14 to July 19 for worsening depression for the past few weeks.  Patient was being prescribed Zoloft by her outpatient psychiatrist and despite the dose being adjusted to 150 mg she did not find it helpful.  Patient reported feeling overwhelmed due to several different factors and had suicidal ideations which led to her seeking help.  During her hospital stay, she was taken off of the sertraline and was switched to Cymbalta.  She was discharged on Cymbalta 30 mg daily, gabapentin 200 mg 3 times daily, hydroxyzine 25 mg 3 times daily as needed, trazodone 100 mg at bedtime.  Patient was seen briefly on August 20 at [facility] after she presented there because she ran out of all her medications.  Today, patient reported that she certainly feels Cymbalta has helped her depression however she still not where she would like to be.  She stated that she feels about 60% better however she still feels like her energy levels are low when she wakes up in the  morning and sometimes prefers to stay in bed.  She reported that Neurontin has helped her anxiety however in the evening time she still needs to take hydroxyzine pretty regularly.  She stated that trazodone did not help her much with sleep and in fact made her loopy and she does not really think she needs anything for sleep at this point. She stated that she was also taking Vyvanse 50 mg prior to admission and the team had recommended that she discontinues it.  However she feels that without taking Vyvanse she cannot function as well.  She is very easily distracted and unable to accomplish any tasks. PDMP was reviewed it was noted that she has been on Adderall XR for quite a number of years and was switched to Vyvanse in April 2021.  Patient denied any symptom suggestive of hypomania or mania.  She stated that after her discharge the plan was for her to go to attend partial hospitalization program and that was her way of taking a break from work and everything else so that she could get back on her feet.  She stated that she is hoping that she can get in though so far the news have not been positive.  She denied any psychotic symptoms.  She denied consuming excessive alcohol or illicit use of substances. She denied any suicidal or homicidal ideations.  She has 3 daughters and they are in temporary custody of her family members as they felt that she was unsafe when she had  to be hospitalized recently.  She informed that oldest daughter (16) is with her sister who she can see more freely and the other 2 daughters were 3 and 1 are living with her ex and seeing them has not been that easy.  She stated that she still has her parental rights and she is hoping that very soon she can get them back to live with her.    Past Psychiatric History: MDD, ADHD, recent psychiatry hospitalization at [facility].   Previous Psychotropic Medications: Yes   Substance Abuse History in the last 12 months:   No.  Consequences of Substance Abuse: NA  Past Medical History:  Past Medical History:  Diagnosis Date   Acne    Allergy    Anemia    Anxiety    Psoriasis    Thyroid disease    No past surgical history on file.  Family Psychiatric History:   Family History: No family history on file.  Social History:   Social History   Socioeconomic History   Marital status: Divorced    Spouse name: Not on file   Number of children: Not on file   Years of education: Not on file   Highest education level: Not on file  Occupational History    Comment: Supervisor reservations; 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[employer]  Tobacco Use   Smoking status: Never Smoker   Smokeless tobacco: Never Used  Substance and Sexual Activity   Alcohol use: Yes    Alcohol/week: 0.0 standard drinks   Drug use: Never   Sexual activity: Not on file  Other Topics Concern   Not on file  Social History Narrative   Not on file   Social Determinants of Health   Financial Resource Strain:    Difficulty of Paying Living Expenses: Not on file  Food Insecurity:    Worried About Running Out of Food in the Last Year: Not on file   Ran Out of Food in the Last Year: Not on file  Transportation Needs:    Lack of Transportation (Medical): Not on file   Lack of Transportation (Non-Medical): Not on file  Physical Activity:    Days of Exercise per Week: Not on file   Minutes of Exercise per Session: Not on file  Stress:    Feeling of Stress : Not on file  Social Connections:    Frequency of Communication with Friends and Family: Not on file   Frequency of Social Gatherings with Friends and Family: Not on file   Attends Religious Services: Not on file   Active Member of Clubs or Organizations: Not on file   Attends Banker Meetings: Not on file   Marital Status: Not on file    Additional Social History: Lives alone, has 3 children (all daughters)- 9, 3 and 1. Currently  unemployed. Older daughter living with her sister and younger ones with their dad.  Family got concerned after her recent hospitalization and to temporary custody of her children.  She is planning to get back on her feet and then fight back to get them.  Allergies:  No Known Allergies  Metabolic Disorder Labs: Lab Results  Component Value Date   HGBA1C 4.4 (L) 07/09/2020   MPG 79.58 07/09/2020   No results found for: PROLACTIN Lab Results  Component Value Date   CHOL 131 07/09/2020   TRIG 143 07/09/2020   HDL 59 07/09/2020   CHOLHDL 2.2 07/09/2020   VLDL 29 07/09/2020   LDLCALC 43 07/09/2020  Lab Results  Component Value Date   TSH 2.264 07/09/2020    Therapeutic Level Labs: No results found for: LITHIUM No results found for: CBMZ No results found for: VALPROATE  Current Medications: Current Outpatient Medications  Medication Sig Dispense Refill   DULoxetine (CYMBALTA) 30 MG capsule Take 1 capsule (30 mg total) by mouth 2 (two) times daily. 60 capsule 1   gabapentin (NEURONTIN) 300 MG capsule Take 1 capsule (300 mg total) by mouth 3 (three) times daily. 90 capsule 1   lisdexamfetamine (VYVANSE) 50 MG capsule Take 1 capsule (50 mg total) by mouth daily. 30 capsule 0   [START ON 09/24/2020] lisdexamfetamine (VYVANSE) 50 MG capsule Take 1 capsule (50 mg total) by mouth daily. 30 capsule 0   neomycin-bacitracin-polymyxin (NEOSPORIN) ointment Apply topically 3 (three) times daily. Applied to affected area 7 g 0   nicotine (NICODERM CQ - DOSED IN MG/24 HOURS) 21 mg/24hr patch Place 1 patch (21 mg total) onto the skin daily. 14 patch 0   No current facility-administered medications for this visit.     Psychiatric Specialty Exam: Review of Systems  unknown if currently breastfeeding.There is no height or weight on file to calculate BMI.  General Appearance: Fairly Groomed  Eye Contact:  Good  Speech:  Clear and Coherent and Normal Rate  Volume:  Normal  Mood:  Anxious  and Depressed  Affect:  Congruent  Thought Process:  Goal Directed  Orientation:  Full (Time, Place, and Person)  Thought Content:  Logical  Suicidal Thoughts:  No  Homicidal Thoughts:  No  Memory:  Immediate;   Good Recent;   Good  Judgement:  Fair  Insight:  Fair  Psychomotor Activity:  Normal  Concentration:  Concentration: Good and Attention Span: Good  Recall:  Good  Fund of Knowledge:Good  Language: Good  Akathisia:  Negative  Handed:  Right  AIMS (if indicated):  Not done  Assets:  Communication Skills Desire for Improvement Financial Resources/Insurance Housing  ADL's:  Intact  Cognition: WNL  Sleep:  Fair   Screenings: AIMS     [facility]  AIMS Total Score 0    AUDIT     Admission (Discharged) [facility]  Alcohol Use Disorder Identification Test Final Score (AUDIT) 3    GAD-7     [facility]  Total GAD-7 Score 17    PHQ2-9     [facility]  PHQ-2 Total Score 0 0 0 0 0      Assessment and Plan: Patient reported some improvement in her depressive symptoms after being started on Cymbalta.  She was agreeable to increasing the dose to 30 mg twice a day for optimal effect.  She was also agreeable to increasing the dose of gabapentin for optimal control of anxiety and discontinuing hydroxyzine as it did not really help much.  She requested to be restarted back on Vyvanse as she wanted to be helpful.  1. Recurrent major depressive disorder, in partial remission (HCC)  - Increase DULoxetine (CYMBALTA) 30 MG capsule;  Take 1 capsule (30 mg total) by mouth 2 (  two) times daily.  Dispense: 60 capsule; Refill: 1 - Increase gabapentin (NEURONTIN) 300 MG capsule; Take 1 capsule (300 mg total) by mouth 3 (three) times daily.  Dispense: 90 capsule; Refill: 1 - Discontinue Hydroxyzine and Trazodone due to lack of efficacy  2. Attention deficit hyperactivity disorder (ADHD), predominantly inattentive type  -Restart lisdexamfetamine (VYVANSE) 50 MG capsule; Take 1 capsule (50 mg total) by mouth daily.  Dispense: 30 capsule; Refill: 0 - lisdexamfetamine (VYVANSE) 50 MG capsule; Take 1 capsule (50 mg total) by mouth daily.  Dispense: 30 capsule; Refill: 0  Refer patient for individual therapy in the clinic. Follow-up in 2 months.  Zena Amos, MD 8/31/202111:14 AM

## 2020-08-29 ENCOUNTER — Ambulatory Visit (INDEPENDENT_AMBULATORY_CARE_PROVIDER_SITE_OTHER): Payer: Medicaid Other | Admitting: Clinical

## 2020-08-29 ENCOUNTER — Other Ambulatory Visit: Payer: Self-pay

## 2020-08-29 DIAGNOSIS — F3341 Major depressive disorder, recurrent, in partial remission: Secondary | ICD-10-CM | POA: Diagnosis not present

## 2020-08-29 NOTE — Progress Notes (Signed)
   THERAPIST PROGRESS NOTE Virtual Visit via Video Note  I connected with Wanda Torres on 08/29/20 at  9:00 AM EDT by a video enabled telemedicine application and verified that I am speaking with the correct person using two identifiers.  Location: Patient: Home Provider: Office   I discussed the limitations of evaluation and management by telemedicine and the availability of in person appointments. The patient expressed understanding and agreed to proceed.   Follow Up Instructions:   I discussed the assessment and treatment plan with the patient. The patient was provided an opportunity to ask questions and all were answered. The patient agreed with the plan and demonstrated an understanding of the instructions.   The patient was advised to call back or seek an in-person evaluation if the symptoms worsen or if the condition fails to improve as anticipated.    Session Time: 52 minute  Participation Level: Active  Behavioral Response: CasualAlertDepressed  Type of Therapy: Individual Therapy  Treatment Goals addressed: Diagnosis: Depression  Interventions: CBT and Supportive  Summary:  Wanda Torres is a 29 y.o. female who presents with for the scheduled session oriented times five, appropriately dressed, and friendly. Client denied hallucinations and delusions.  Client presented for initial visit with therapist as follow up for engagement in outpatient therapy services following discharge from [facility]. Client reported she was treated for depression with suicidal ideation on 07/09/20 for voluntary treatment. Client reported this year was her first hospitalization for mental health symptoms. Client reported she had been taking medication for depression since the age of 60. Client reported her current stressors are her children not being in her custody since she admittance to the hospital. Client reported a strained relationship with her family because some of her family  made decisions to remove her children from her care. Client reported she is working on finding out how to have her children back with her.  Client reported the current medication regimen has been successful with managing her mood. Client reported yesterday she found herself enjoying the day and engaging in activities she had not done in awhile.  Client reported she has enjoyed doing meditations and practicing yoga daily.      Suicidal/Homicidal: Nowithout intent/plan  Therapist Response:  Therapist began the session by introductions and discussing confidentiality. Therapist engaged the client in conversation by asking open ended questions about how she is doing since discharge and beginning of a new medication regimen. Therapist asked the client open ended questions about her treatment history regarding depression. Therapist completed the depression treatment plan with the clients input. Therapist identified past coping skills that have been effective for depression management and encouraged the client to continue those weekly. Therapist addressed questions and concerns.      Plan: Return again in 4 weeks for individual therapy.  Diagnosis: Recurrent major depressive disorder, in partial remission    Neena Rhymes Anacleto Batterman, LCSW 08/29/2020

## 2020-09-15 ENCOUNTER — Other Ambulatory Visit (HOSPITAL_COMMUNITY): Payer: Self-pay | Admitting: Licensed Clinical Social Worker

## 2020-09-15 ENCOUNTER — Other Ambulatory Visit: Payer: Self-pay

## 2020-09-15 ENCOUNTER — Ambulatory Visit (HOSPITAL_COMMUNITY)
Admission: RE | Admit: 2020-09-15 | Discharge: 2020-09-15 | Disposition: A | Discharge status: Home / Self Care | Payer: Medicaid Other | Attending: Psychiatry | Admitting: Psychiatry

## 2020-09-15 ENCOUNTER — Ambulatory Visit (HOSPITAL_COMMUNITY)
Admission: EM | Admit: 2020-09-15 | Discharge: 2020-09-16 | Disposition: A | Discharge status: Other Acute Inpatient Hospital | Payer: Medicaid Other | Attending: Nurse Practitioner | Admitting: Nurse Practitioner

## 2020-09-15 ENCOUNTER — Encounter (HOSPITAL_COMMUNITY): Payer: Self-pay

## 2020-09-15 DIAGNOSIS — Z20822 Contact with and (suspected) exposure to covid-19: Secondary | ICD-10-CM | POA: Insufficient documentation

## 2020-09-15 DIAGNOSIS — F419 Anxiety disorder, unspecified: Secondary | ICD-10-CM | POA: Insufficient documentation

## 2020-09-15 DIAGNOSIS — Z79899 Other long term (current) drug therapy: Secondary | ICD-10-CM | POA: Insufficient documentation

## 2020-09-15 DIAGNOSIS — G4700 Insomnia, unspecified: Secondary | ICD-10-CM | POA: Insufficient documentation

## 2020-09-15 DIAGNOSIS — Z7289 Other problems related to lifestyle: Secondary | ICD-10-CM | POA: Insufficient documentation

## 2020-09-15 DIAGNOSIS — F332 Major depressive disorder, recurrent severe without psychotic features: Secondary | ICD-10-CM | POA: Insufficient documentation

## 2020-09-15 DIAGNOSIS — R450 Nervousness: Secondary | ICD-10-CM | POA: Insufficient documentation

## 2020-09-15 DIAGNOSIS — R45851 Suicidal ideations: Secondary | ICD-10-CM | POA: Insufficient documentation

## 2020-09-15 DIAGNOSIS — Z638 Other specified problems related to primary support group: Secondary | ICD-10-CM | POA: Insufficient documentation

## 2020-09-15 HISTORY — DX: Depression, unspecified: F32.A

## 2020-09-15 LAB — POCT URINE DRUG SCREEN - MANUAL ENTRY (I-SCREEN)
POC Amphetamine UR: NOT DETECTED
POC Buprenorphine (BUP): NOT DETECTED
POC Cocaine UR: NOT DETECTED
POC Marijuana UR: NOT DETECTED
POC Methadone UR: NOT DETECTED
POC Methamphetamine UR: NOT DETECTED
POC Morphine: NOT DETECTED
POC Oxazepam (BZO): NOT DETECTED
POC Oxycodone UR: NOT DETECTED
POC Secobarbital (BAR): NOT DETECTED

## 2020-09-15 LAB — POC SARS CORONAVIRUS 2 AG -  ED: SARS Coronavirus 2 Ag: NEGATIVE

## 2020-09-15 LAB — POCT PREGNANCY, URINE: Preg Test, Ur: NEGATIVE

## 2020-09-15 MED ORDER — HYDROXYZINE HCL 25 MG PO TABS
25.0000 mg | ORAL_TABLET | Freq: Three times a day (TID) | ORAL | Status: DC | PRN
  Administered 2020-09-15 – 2020-09-16 (×2): 25 mg via ORAL
  Filled 2020-09-15 (×2): qty 1

## 2020-09-15 MED ORDER — DULOXETINE HCL 30 MG PO CPEP
30.0000 mg | ORAL_CAPSULE | Freq: Two times a day (BID) | ORAL | Status: DC
  Administered 2020-09-15 – 2020-09-16 (×2): 30 mg via ORAL
  Filled 2020-09-15 (×2): qty 1

## 2020-09-15 MED ORDER — NICOTINE 14 MG/24HR TD PT24
14.0000 mg | MEDICATED_PATCH | Freq: Every day | TRANSDERMAL | Status: DC
  Filled 2020-09-15: qty 1

## 2020-09-15 MED ORDER — ALUM & MAG HYDROXIDE-SIMETH 200-200-20 MG/5ML PO SUSP: 30.0000 mL | ORAL | Status: DC | PRN

## 2020-09-15 MED ORDER — ACETAMINOPHEN 325 MG PO TABS: 650.0000 mg | ORAL_TABLET | Freq: Four times a day (QID) | ORAL | Status: DC | PRN

## 2020-09-15 MED ORDER — TRAZODONE HCL 50 MG PO TABS
50.0000 mg | ORAL_TABLET | Freq: Every evening | ORAL | Status: DC | PRN
  Administered 2020-09-15: 50 mg via ORAL
  Filled 2020-09-15: qty 1

## 2020-09-15 MED ORDER — GABAPENTIN 300 MG PO CAPS
300.0000 mg | ORAL_CAPSULE | Freq: Three times a day (TID) | ORAL | Status: DC
  Administered 2020-09-15 – 2020-09-16 (×2): 300 mg via ORAL
  Filled 2020-09-15 (×2): qty 1

## 2020-09-15 MED ORDER — NICOTINE 14 MG/24HR TD PT24
14.0000 mg | MEDICATED_PATCH | Freq: Every day | TRANSDERMAL | Status: DC
  Administered 2020-09-15 – 2020-09-16 (×2): 14 mg via TRANSDERMAL
  Filled 2020-09-15: qty 1

## 2020-09-15 MED ORDER — NICOTINE POLACRILEX 2 MG MT GUM
2.0000 mg | CHEWING_GUM | OROMUCOSAL | Status: DC | PRN
  Filled 2020-09-15: qty 1

## 2020-09-15 MED ORDER — MAGNESIUM HYDROXIDE 400 MG/5ML PO SUSP: 30.0000 mL | Freq: Every day | ORAL | Status: DC | PRN

## 2020-09-15 NOTE — H&P (Signed)
Behavioral Health Medical Screening Exam  Wanda Torres is an 29 y.o. female.  Total Time spent with patient: 15 minutes  Psychiatric Specialty Exam: Physical Exam Constitutional:      General: She is not in acute distress.    Appearance: She is not ill-appearing or toxic-appearing.  Pulmonary:     Effort: Pulmonary effort is normal. No respiratory distress.  Neurological:     Mental Status: She is alert and oriented to person, place, and time.  Psychiatric:        Mood and Affect: Mood is anxious and depressed.        Thought Content: Thought content is not paranoid or delusional. Thought content includes suicidal ideation. Thought content does not include homicidal ideation. Thought content includes suicidal plan.    Review of Systems  Constitutional: Negative for chills, diaphoresis and fever.  Respiratory: Negative for cough and shortness of breath.   Cardiovascular: Negative for chest pain and palpitations.  Gastrointestinal: Negative for diarrhea, nausea and vomiting.  Neurological: Negative for seizures.  Psychiatric/Behavioral: Positive for decreased concentration, dysphoric mood and sleep disturbance. The patient is nervous/anxious.   All other systems reviewed and are negative.  unknown if currently breastfeeding.There is no height or weight on file to calculate BMI. General Appearance: Fairly Groomed Eye Contact:  Fair Speech:  Clear and Coherent and Normal Rate Volume:  Normal Mood:  Anxious, Dysphoric, Hopeless and Worthless Affect:  Congruent and Depressed Thought Process:  Coherent, Goal Directed, Linear and Descriptions of Associations: Intact Orientation:  Full (Time, Place, and Person) Thought Content:  Logical Suicidal Thoughts:  Yes.  with intent/plan Homicidal Thoughts:  No Memory:  Immediate;   Good Recent;   Good Judgement:  Intact Insight:  Lacking Psychomotor Activity:  Normal Concentration: Concentration: Fair and Attention Span:  Fair Recall:  Good Fund of Knowledge:Good Language: Good Akathisia:  Negative Handed:  Right AIMS (if indicated):    Assets:  Communication Skills Desire for Improvement Financial Resources/Insurance Physical Health Sleep:     Musculoskeletal: Strength & Muscle Tone: within normal limits Gait & Station: normal Patient leans: N/A  unknown if currently breastfeeding.  Recommendations: Based on my evaluation the patient does not appear to have an emergency medical condition.  Jackelyn Poling, NP 09/15/2020, 8:51 PM

## 2020-09-15 NOTE — BH Assessment (Addendum)
Assessment Note  Wanda Torres is an 29 y.o. female. She presents to [facility] as a walk-in (self referral). States, "I am here because I want to die". She explains that she was hospitalized at [facility] June 2021 after experiencing suicidal thoughts with a plan. Her hospitalization at [facility] was 6-7 days. Upon discharge she admits that she felt better. She mentions medication changes may have contributed to her stabilization and "making feel better". She discharged home and admits that she did not follow up with the discharge instructions to see a therapist and/or psychiatrist. She also learned when she discharged home that her #3 children were removed from her custody. Her children are 1, 3, and 72 y/o. The two youngest live with hr ex. The oldest (30 y/o) lives with her sister. States that the 80 y/o refuses to speak with her. Patient feels hurt by the discord she is having with her daughter. She reports feelings of guilt because she feels mentally unstable. Patient has tried to deal with her mental health on her own. However, her mental health declined leading to another hospitalization.   Patient states that she went back into the hospital for inpatient crises stabilization July 2021 at [facility]. She wasn't happy with the psychiatrist and medications he was prescribing her. She decided to sign herself out of the facility AMA. She went home with continued suicidal thoughts and has felt suicidal since July. She has sense followed up with a psychiatrist-Dr. Evelene Croon and Willette Pa.  She spoke with her friends today about her continued thoughts of suicide. She was encouraged to come back to [facility] for psychiatric treatment.   Patient with current suicidal thoughts. She has a plan to take a Tylenol, cut her wrist, get into the shower and bleed to death. No prior history of suicide attempts. She has a history of suicidal ideations with plan. Depressive symptoms reported: Feeling angry/irritable, Loss of interest  in usual pleasures, Feeling worthless/self pity, Guilt, Fatigue, Isolating, Tearfulness. Anxiety increased in the past several months.  No noted family history of metal health illness. She denies HI. No history of aggressive and/or assaultive behaviors. No legal issues outside of going back and forth to court for custody of her children. No AVHs. She does not appear to be responding to internal stimuli. Patient denies drug use. States that she drinks alcohol when under a lot of stress. She estimates her frequency of alcohol use to be 1-4 times per month (f2-3 shots per use).  Today she had #3 shots of liquor prior to arrival.    Patient was alert and oriented to person, place, time, and situation. She was tearful. Speech was normal. Insight and judgement were fair. Impulse control is fair. She is dressed appropriately. Mood is depressed. Memory recently intact. Memory remotely intact.   Diagnosis: Major Depressive Disorder, Recurrent, Severe, without psychotic features; Anxiety Disorder  Past Medical History:  Past Medical History:  Diagnosis Date  . Acne   . Allergy   . Anemia   . Anxiety   . Depression   . Psoriasis     No past surgical history on file.  Family History: No family history on file.  Social History:  reports that she has never smoked. She has never used smokeless tobacco. She reports current alcohol use. She reports that she does not use drugs.  Additional Social History:  Alcohol / Drug Use Pain Medications: See MAR Prescriptions: See MAR Over the Counter: See MAR History of alcohol / drug use?: Yes  Substance #1 Name of Substance 1: Alcohol 1 - Age of First Use: 29 yrs old 1 - Amount (size/oz): "A few shots" 1 - Frequency: "I drink socially or when things are bad" 1 - Duration: on-going 1 - Last Use / Amount: 3 shots of liquor prior to arrival to [facility]  CIWA:   COWS:    Allergies: No Known Allergies  Home Medications: (Not in a hospital admission)   OB/GYN  Status:  No LMP recorded. (Menstrual status: IUD).  General Assessment Data Location of Assessment:  [facility]) TTS Assessment: In system Is this a Tele or Face-to-Face Assessment?: Face-to-Face Is this an Initial Assessment or a Re-assessment for this encounter?: Initial Assessment Patient Accompanied by::  (Self Referral ) Language Other than English: No Living Arrangements: Other (Comment) What gender do you identify as?: Female Date Telepsych consult ordered in [facility]:  (09/15/20) Marital status: Single Maiden name:  (unk) Living Arrangements: Children (Children ) Can pt return to current living arrangement?: Yes Admission Status: Voluntary Is patient capable of signing voluntary admission?: Yes Referral Source: Self/Family/Friend Insurance type:  (Medicaid )     Crisis Care Plan Living Arrangements: Children (Children ) Legal Guardian:  (no) Name of Psychiatrist:  (Rupindaur Evelene Croon ) Name of Therapist:  Lia Foyer appointment.)  Education Status Is patient currently in school?: No  Risk to self with the past 6 months Suicidal Ideation: Yes-Currently Present Has patient been a risk to self within the past 6 months prior to admission? : Yes Suicidal Intent: Yes-Currently Present Has patient had any suicidal intent within the past 6 months prior to admission? : Yes Is patient at risk for suicide?: Yes Suicidal Plan?: Yes-Currently Present Has patient had any suicidal plan within the past 6 months prior to admission? : Yes Access to Means: Yes Specify Access to Suicidal Means:  (medications and sharp objects) What has been your use of drugs/alcohol within the last 12 months?:  (socially drinks ) Previous Attempts/Gestures: No How many times?:  (0) Triggers for Past Attempts:  (n/a) Intentional Self Injurious Behavior: None Family Suicide History: Unknown Recent stressful life event(s):  (loss children ) Persecutory voices/beliefs?: No Depression: Yes Depression  Symptoms: Feeling angry/irritable, Loss of interest in usual pleasures, Feeling worthless/self pity, Guilt, Fatigue, Isolating, Tearfulness Substance abuse history and/or treatment for substance abuse?: No Suicide prevention information given to non-admitted patients: Not applicable  Risk to Others within the past 6 months Homicidal Ideation: No Does patient have any lifetime risk of violence toward others beyond the six months prior to admission? : No Thoughts of Harm to Others: No Current Homicidal Intent: No Current Homicidal Plan: No Access to Homicidal Means: No Identified Victim:  (n/a) History of harm to others?: No Assessment of Violence: None Noted Violent Behavior Description:  (currently calm and cooperative ) Does patient have access to weapons?: No Criminal Charges Pending?: No Does patient have a court date: No Is patient on probation?: No  Psychosis Hallucinations: None noted Delusions: None noted  Mental Status Report Appearance/Hygiene: Disheveled Eye Contact: Fair Motor Activity: Unremarkable Speech: Logical/coherent Level of Consciousness: Alert Mood: Depressed Affect: Appropriate to circumstance Anxiety Level: None Thought Processes: Relevant, Coherent Judgement: Impaired Orientation: Person, Place, Time, Situation Obsessive Compulsive Thoughts/Behaviors: None  Cognitive Functioning Concentration: Decreased Memory: Recent Intact, Remote Intact Is patient IDD: No Insight: Fair Impulse Control: Fair Appetite: Poor Have you had any weight changes? : No Change Sleep: Increased Total Hours of Sleep:  (18 hrs ) Vegetative Symptoms: None  ADLScreening [facility]  Assessment Services) Patient's cognitive ability adequate to safely complete daily activities?: Yes Patient able to express need for assistance with ADLs?: Yes Independently performs ADLs?: Yes (appropriate for developmental age)  Prior Inpatient Therapy Prior Inpatient Therapy: Yes Prior  Therapy Dates:  (June-[facility] and Wanda Torres ) Prior Therapy Facilty/Provider(s):  ([facility]) Reason for Treatment:  (depression )  Prior Outpatient Therapy Prior Outpatient Therapy: Yes Prior Therapy Dates:  (current ) Prior Therapy Facilty/Provider(s):  (Rupindaur Kaur-psychiatrist/Audrey Jenson-therapist) Reason for Treatment:  (depression ) Does patient have an ACCT team?: No Does patient have Intensive In-House Services?  : No Does patient have [facility] services? : No Does patient have P4CC services?: Yes  ADL Screening (condition at time of admission) Patient's cognitive ability adequate to safely complete daily activities?: Yes Patient able to express need for assistance with ADLs?: Yes Independently performs ADLs?: Yes (appropriate for developmental age)                        Disposition: Per Nira Conn, NP, patient meets criteria for inpatient treatment. No [facility] beds available.  Patient to be admitted to [facility] for placement by Disposition Social Worker. Disposition Initial Assessment Completed for this Encounter: Yes Disposition of Patient: Admit (Per Nira Conn, NP, patient meets INPT treatment ) Type of inpatient treatment program: Adult  On Site Evaluation by:   Reviewed with Physician:    Melynda Ripple 09/15/2020 10:55 PM

## 2020-09-15 NOTE — ED Notes (Signed)
Pt belongings placed in locker 23.

## 2020-09-15 NOTE — ED Provider Notes (Signed)
Behavioral Health Admission H&P [facility](FBC & OBS)  Date: 09/16/20 Patient Name: Wanda Torres MRN: [id number] Chief Complaint:  Chief Complaint  Patient presents with  . Suicidal      Diagnoses:  Final diagnoses:  Severe recurrent major depression without psychotic features (HCC)    HPI: Wanda Torres is a 29 y.o female with a history of MDD who [facility] due to worsening depression and suicidal thoughts with a plan to She has a plan to take a Tylenol, cut her wrist, get into the shower and bleed to death. [facility].  Patient reports that her depression has been worsening over the past few weeks.  She reports family discord as a trigger.  She states that her 30-year-old daughter refuses to speak with her.  She states that her 2 youngest children live with her ex and her 30-year-old lives with her sister.  She states that she learned after her discharge from [facility] in June 2021 that her children were removed from her custody.  Patient reports that [facility] due to depression and suicidal ideations.  She states that she did not have a good experience at [facility] and signed a 72-hour release.  She states that she has continued to have suicidal thoughts since [facility].  She is [facility] by [facility]aur [facility].  She denies homicidal ideations.  She denies auditory and visual hallucinations.  No indication that she is responding to internal stimuli.  No evidence of delusional thought content.  She reports occasional alcohol use when she is feeling stressed.  She denies use of other substances.  Her urine drug screen is negative.  PHQ 2-9:    Office Visit from 09/23/2016 [facility] Office Visit from 03/22/[facility] Office Visit from 01[facility]26/[facility]  Thoughts that you would be better off dead, or of hurting  yourself in some way Not at all Not at all Not at all  PHQ-9 Total Score 16 0 14        Admission (Discharged) from OP [facility]  C-SSRS RISK CATEGORY High Risk       Total Time spent with patient: 20 minutes  Musculoskeletal  Strength & Muscle Tone: within normal limits Gait & Station: normal Patient leans: N/A  Psychiatric Specialty Exam  Presentation General Appearance: Casual;Fairly Groomed  Eye Contact:Fair  Speech:Clear and Coherent  Speech Volume:Decreased  Handedness:Right   Mood and Affect  Mood:Depressed;Anxious;Hopeless;Worthless  Affect:Congruent;Depressed   Art gallery managerThought Process  Thought Processes:Coherent  Descriptions of Associations:Intact  Orientation:Full (Time, Place and Person)  Thought Content:Logical  Hallucinations:Hallucinations: None  Ideas of Reference:None  Suicidal Thoughts:Suicidal Thoughts: Yes, Active SI Active Intent and/or Plan: With Intent;With Plan;With Means to Carry Out  Homicidal Thoughts:Homicidal Thoughts: No   Sensorium  Memory:Immediate Good;Recent Good;Remote Good  Judgment:Intact  Insight:Lacking;Fair   Art therapistxecutive Functions  Concentration:Fair  Attention Span:Fair  Recall:Good  Progress EnergyFund of Knowledge:Good  Language:Good   Psychomotor Activity  Psychomotor Activity:Psychomotor Activity: Normal   Assets  Assets:Communication Skills;Desire for Improvement;Financial Resources/Insurance;Physical Health   Sleep  Sleep:Sleep: Fair   Physical Exam Constitutional:      General: She is not in acute distress.    Appearance: She is not ill-appearing, toxic-appearing or diaphoretic.  HENT:     Head: Normocephalic.  Right Ear: External ear normal.     Left Ear: External ear normal.  Eyes:     Conjunctiva/sclera: Conjunctivae normal.     Pupils: Pupils are equal, round, and reactive to light.  Cardiovascular:     Rate and Rhythm: Normal  rate.  Pulmonary:     Effort: Pulmonary effort is normal. No respiratory distress.  Musculoskeletal:        General: Normal range of motion.  Neurological:     Mental Status: She is alert and oriented to person, place, and time.  Psychiatric:        Mood and Affect: Mood is anxious and depressed.        Thought Content: Thought content is not paranoid or delusional. Thought content includes suicidal ideation. Thought content does not include homicidal ideation. Thought content includes suicidal plan.    Review of Systems  Constitutional: Negative for chills, diaphoresis, fever, malaise/fatigue and weight loss.  HENT: Negative for congestion.   Respiratory: Negative for cough and shortness of breath.   Cardiovascular: Negative for chest pain and palpitations.  Gastrointestinal: Negative for diarrhea, nausea and vomiting.  Neurological: Negative for dizziness and seizures.  Psychiatric/Behavioral: Positive for depression and suicidal ideas. Negative for hallucinations, memory loss and substance abuse. The patient is nervous/anxious and has insomnia.   All other systems reviewed and are negative.   Blood pressure (!) 110/91, pulse 97, temperature 98.1 F (36.7 C), temperature source Oral, resp. rate 18, SpO2 98 %, unknown if currently breastfeeding. There is no height or weight on file to calculate BMI.  Past Psychiatric History: MDD, ADHD  Is the patient at risk to self? Yes  Has the patient been a risk to self in the past 6 months? Yes .    Has the patient been a risk to self within the distant past? Yes   Is the patient a risk to others? No   Has the patient been a risk to others in the past 6 months? No   Has the patient been a risk to others within the distant past? No   Past Medical History:  Past Medical History:  Diagnosis Date  . Acne   . Allergy   . Anemia   . Anxiety   . Depression   . Psoriasis    No past surgical history on file.  Family History: No family  history on file.  Social History:  Social History   Socioeconomic History  . Marital status: Divorced    Spouse name: Not on file  . Number of children: Not on file  . Years of education: Not on file  . Highest education level: Not on file  Occupational History    Comment: Supervisor reservations; 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[employer]  Tobacco Use  . Smoking status: Never Smoker  . Smokeless tobacco: Never Used  Vaping Use  . Vaping Use: Every day  . Substances: Nicotine  Substance and Sexual Activity  . Alcohol use: Yes    Alcohol/week: 0.0 standard drinks  . Drug use: Never  . Sexual activity: Not on file  Other Topics Concern  . Not on file  Social History Narrative  . Not on file   Social Determinants of Health   Financial Resource Strain:   . Difficulty of Paying Living Expenses: Not on file  Food Insecurity:   . Worried About Programme researcher, broadcasting/film/video in the Last Year: Not on file  . Ran Out of Food in the Last Year: Not on file  Transportation Needs:   . Freight forwarder (Medical): Not on file  . Lack of Transportation (Non-Medical): Not on file  Physical Activity:   . Days of Exercise per Week: Not on file  . Minutes of Exercise per Session: Not on file  Stress:   . Feeling of Stress : Not on file  Social Connections:   . Frequency of Communication with Friends and Family: Not on file  . Frequency of Social Gatherings with Friends and Family: Not on file  . Attends Religious Services: Not on file  . Active Member of Clubs or Organizations: Not on file  . Attends Banker Meetings: Not on file  . Marital Status: Not on file  Intimate Partner Violence:   . Fear of Current or Ex-Partner: Not on file  . Emotionally Abused: Not on file  . Physically Abused: Not on file  . Sexually Abused: Not on file    SDOH:  SDOH Screenings   Alcohol Screen: Low Risk   . Last Alcohol Screening Score (AUDIT): 3  Depression (PHQ2-9):   . PHQ-2 Score: Not on file   Financial Resource Strain:   . Difficulty of Paying Living Expenses: Not on file  Food Insecurity:   . Worried About Programme researcher, broadcasting/film/video in the Last Year: Not on file  . Ran Out of Food in the Last Year: Not on file  Housing:   . Last Housing Risk Score: Not on file  Physical Activity:   . Days of Exercise per Week: Not on file  . Minutes of Exercise per Session: Not on file  Social Connections:   . Frequency of Communication with Friends and Family: Not on file  . Frequency of Social Gatherings with Friends and Family: Not on file  . Attends Religious Services: Not on file  . Active Member of Clubs or Organizations: Not on file  . Attends Banker Meetings: Not on file  . Marital Status: Not on file  Stress:   . Feeling of Stress : Not on file  Tobacco Use: Low Risk   . Smoking Tobacco Use: Never Smoker  . Smokeless Tobacco Use: Never Used  Transportation Needs:   . Freight forwarder (Medical): Not on file  . Lack of Transportation (Non-Medical): Not on file    Last Labs:  Admission on 09/15/2020  Component Date Value Ref Range Status  . SARS Coronavirus 2 Ag 09/15/2020 Negative  Negative Preliminary  . WBC 09/15/2020 7.2  4.0 - 10.5 K/uL Final  . RBC 09/15/2020 4.78  3.87 - 5.11 MIL/uL Final  . Hemoglobin 09/15/2020 16.1* 12.0 - 15.0 g/dL Final  . HCT 60/45/4098 45.0  36 - 46 % Final  . MCV 09/15/2020 94.1  80.0 - 100.0 fL Final  . MCH 09/15/2020 33.7  26.0 - 34.0 pg Final  . MCHC 09/15/2020 35.8  30.0 - 36.0 g/dL Final  . RDW 11/91/4782 11.4* 11.5 - 15.5 % Final  . Platelets 09/15/2020 276  150 - 400 K/uL Final  . nRBC 09/15/2020 0.0  0.0 - 0.2 % Final  . Neutrophils Relative % 09/15/2020 56  % Final  . Neutro Abs 09/15/2020 4.1  1.7 - 7.7 K/uL Final  . Lymphocytes Relative 09/15/2020 35  % Final  . Lymphs Abs 09/15/2020 2.5  0.7 - 4.0 K/uL Final  . Monocytes Relative 09/15/2020 5  % Final  . Monocytes Absolute 09/15/2020 0.3  0 - 1 K/uL Final  .  Eosinophils Relative 09/15/2020 2  %  Final  . Eosinophils Absolute 09/15/2020 0.1  0 - 0 K/uL Final  . Basophils Relative 09/15/2020 1  % Final  . Basophils Absolute 09/15/2020 0.0  0 - 0 K/uL Final  . Immature Granulocytes 09/15/2020 1  % Final  . Abs Immature Granulocytes 09/15/2020 0.06  0.00 - 0.07 K/uL Final   Performed at [facility], [address] [place], [place] [address]  . Sodium 09/15/2020 141  135 - 145 mmol/L Final  . Potassium 09/15/2020 3.4* 3.5 - 5.1 mmol/L Final  . Chloride 09/15/2020 105  98 - 111 mmol/L Final  . CO2 09/15/2020 24  22 - 32 mmol/L Final  . Glucose, Bld 09/15/2020 63* 70 - 99 mg/dL Final   Glucose reference range applies only to samples taken after fasting for at least 8 hours.  . BUN 09/15/2020 12  6 - 20 mg/dL Final  . Creatinine, Ser 09/15/2020 0.73  0.44 - 1.00 mg/dL Final  . Calcium 19/14/7829 9.1  8.9 - 10.3 mg/dL Final  . Total Protein 09/15/2020 6.8  6.5 - 8.1 g/dL Final  . Albumin 56/21/3086 4.0  3.5 - 5.0 g/dL Final  . AST 57/84/6962 19  15 - 41 U/L Final  . ALT 09/15/2020 22  0 - 44 U/L Final  . Alkaline Phosphatase 09/15/2020 61  38 - 126 U/L Final  . Total Bilirubin 09/15/2020 0.7  0.3 - 1.2 mg/dL Final  . GFR calc non Af Amer 09/15/2020 >60  >60 mL/min Final  . GFR calc Af Amer 09/15/2020 >60  >60 mL/min Final  . Anion gap 09/15/2020 12  5 - 15 Final   Performed at [facility], [address] [place], [place] [address]  . POC Amphetamine UR 09/15/2020 None Detected  None Detected Final  . POC Secobarbital (BAR) 09/15/2020 None Detected  None Detected Final  . POC Buprenorphine (BUP) 09/15/2020 None Detected  None Detected Final  . POC Oxazepam (BZO) 09/15/2020 None Detected  None Detected Final  . POC Cocaine UR 09/15/2020 None Detected  None Detected Final  . POC Methamphetamine UR 09/15/2020 None Detected  None Detected Final  . POC Morphine 09/15/2020 None Detected  None Detected Final  . POC Oxycodone UR 09/15/2020  None Detected  None Detected Final  . POC Methadone UR 09/15/2020 None Detected  None Detected Final  . POC Marijuana UR 09/15/2020 None Detected  None Detected Final  . Preg Test, Ur 09/15/2020 NEGATIVE  NEGATIVE Final   Comment:        THE SENSITIVITY OF THIS METHODOLOGY IS >24 mIU/mL   Admission on 07/09/2020, Discharged on 07/14/2020  Component Date Value Ref Range Status  . Sodium 07/09/2020 137  135 - 145 mmol/L Final  . Potassium 07/09/2020 3.9  3.5 - 5.1 mmol/L Final  . Chloride 07/09/2020 101  98 - 111 mmol/L Final  . CO2 07/09/2020 26  22 - 32 mmol/L Final  . Glucose, Bld 07/09/2020 103* 70 - 99 mg/dL Final   Glucose reference range applies only to samples taken after fasting for at least 8 hours.  . BUN 07/09/2020 18  6 - 20 mg/dL Final  . Creatinine, Ser 07/09/2020 0.78  0.44 - 1.00 mg/dL Final  . Calcium 13/24/4010 8.9  8.9 - 10.3 mg/dL Final  . Total Protein 07/09/2020 7.2  6.5 - 8.1 g/dL Final  . Albumin 27/25/3664 4.2  3.5 - 5.0 g/dL Final  . AST 40/34/7425 50* 15 - 41 U/L Final  .  ALT 07/09/2020 49* 0 - 44 U/L Final  . Alkaline Phosphatase 07/09/2020 73  38 - 126 U/L Final  . Total Bilirubin 07/09/2020 0.7  0.3 - 1.2 mg/dL Final  . GFR calc non Af Amer 07/09/2020 >60  >60 mL/min Final  . GFR calc Af Amer 07/09/2020 >60  >60 mL/min Final  . Anion gap 07/09/2020 10  5 - 15 Final   Performed at [facility], [address] [place], [place] [address]  . Hgb A1c MFr Bld 07/09/2020 4.4* 4.8 - 5.6 % Final   Comment: (NOTE) Pre diabetes:          5.7%-6.4%  Diabetes:              >6.4%  Glycemic control for   <7.0% adults with diabetes   . Mean Plasma Glucose 07/09/2020 79.58  mg/dL Final   Performed at [facility], [address], [place], [place] [address]  . Magnesium 07/09/2020 2.3  1.7 - 2.4 mg/dL Final   Performed at [facility], [address] [place], [place] [address]  . Alcohol, Ethyl (B) 07/09/2020 <10  <10  mg/dL Final   Comment: (NOTE) Lowest detectable limit for serum alcohol is 10 mg/dL.  For medical purposes only. Performed at [facility], [address], [place], [place] [address]   . Cholesterol 07/09/2020 131  0 - 200 mg/dL Final  . Triglycerides 07/09/2020 143  <150 mg/dL Final  . HDL 05/39/7673 59  >40 mg/dL Final  . Total CHOL/HDL Ratio 07/09/2020 2.2  RATIO Final  . VLDL 07/09/2020 29  0 - 40 mg/dL Final  . LDL Cholesterol 07/09/2020 43  0 - 99 mg/dL Final   Comment:        Total Cholesterol/HDL:CHD Risk Coronary Heart Disease Risk Table                     Men   Women  1/2 Average Risk   3.4   3.3  Average Risk       5.0   4.4  2 X Average Risk   9.6   7.1  3 X Average Risk  23.4   11.0        Use the calculated Patient Ratio above and the CHD Risk Table to determine the patient's CHD Risk.        ATP III CLASSIFICATION (LDL):  <100     mg/dL   Optimal  419-379  mg/dL   Near or Above                    Optimal  130-159  mg/dL   Borderline  024-097  mg/dL   High  >353     mg/dL   Very High Performed at [facility], [address], [place], [place] [address]   . Total Protein 07/09/2020 7.1  6.5 - 8.1 g/dL Final  . Albumin 26/83/4196 4.2  3.5 - 5.0 g/dL Final  . AST 22/29/7989 49* 15 - 41 U/L Final  . ALT 07/09/2020 49* 0 - 44 U/L Final  . Alkaline Phosphatase 07/09/2020 72  38 - 126 U/L Final  . Total Bilirubin 07/09/2020 0.7  0.3 - 1.2 mg/dL Final  . Bilirubin, Direct 07/09/2020 0.1  0.0 - 0.2 mg/dL Final  . Indirect Bilirubin 07/09/2020 0.6  0.3 - 0.9 mg/dL Final   Performed at [facility], [address] [place], [place]  [address]  . TSH 07/09/2020 2.264  0.350 - 4.500 uIU/mL Final   Comment: Performed by a 3rd Generation assay with a functional sensitivity of <=0.01 uIU/mL. Performed at [facility], [address], [place], [place] [address]   . Color, Urine 07/09/2020 YELLOW   YELLOW Final  . APPearance 07/09/2020 CLEAR  CLEAR Final  . Specific Gravity, Urine 07/09/2020 1.020  1.005 - 1.030 Final  . pH 07/09/2020 7.0  5.0 - 8.0 Final  . Glucose, UA 07/09/2020 NEGATIVE  NEGATIVE mg/dL Final  . Hgb urine dipstick 07/09/2020 NEGATIVE  NEGATIVE Final  . Bilirubin Urine 07/09/2020 NEGATIVE  NEGATIVE Final  . Ketones, ur 07/09/2020 NEGATIVE  NEGATIVE mg/dL Final  . Protein, ur 07/68/0881 NEGATIVE  NEGATIVE mg/dL Final  . Nitrite 10/26/5944 NEGATIVE  NEGATIVE Final  . Glori Luis 07/09/2020 NEGATIVE  NEGATIVE Final  . RBC / HPF 07/09/2020 0-5  0 - 5 RBC/hpf Final  . WBC, UA 07/09/2020 0-5  0 - 5 WBC/hpf Final  . Bacteria, UA 07/09/2020 RARE* NONE SEEN Final  . Squamous Epithelial / LPF 07/09/2020 0-5  0 - 5 Final   Performed at [facility], [address] [place], [place] [address]  . Opiates 07/09/2020 NONE DETECTED  NONE DETECTED Final  . Cocaine 07/09/2020 NONE DETECTED  NONE DETECTED Final  . Benzodiazepines 07/09/2020 NONE DETECTED  NONE DETECTED Final  . Amphetamines 07/09/2020 NONE DETECTED  NONE DETECTED Final  . Tetrahydrocannabinol 07/09/2020 NONE DETECTED  NONE DETECTED Final  . Barbiturates 07/09/2020 NONE DETECTED  NONE DETECTED Final   Comment: (NOTE) DRUG SCREEN FOR MEDICAL PURPOSES ONLY.  IF CONFIRMATION IS NEEDED FOR ANY PURPOSE, NOTIFY LAB WITHIN 5 DAYS.  LOWEST DETECTABLE LIMITS FOR URINE DRUG SCREEN Drug Class                     Cutoff (ng/mL) Amphetamine and metabolites    1000 Barbiturate and metabolites    200 Benzodiazepine                 200 Tricyclics and metabolites     300 Opiates and metabolites        300 Cocaine and metabolites        300 THC                            50 Performed at [facility], [address], [place], [place] [address]   . SARS Coronavirus 2 07/09/2020 NEGATIVE  NEGATIVE Final   Comment: (NOTE) SARS-CoV-2 target nucleic acids are NOT DETECTED.  The  SARS-CoV-2 RNA is generally detectable in upper and lower respiratory specimens during the acute phase of infection. The lowest concentration of SARS-CoV-2 viral copies this assay can detect is 250 copies / mL. A negative result does not preclude SARS-CoV-2 infection and should not be used as the sole basis for treatment or other patient management decisions.  A negative result may occur with improper specimen collection / handling, submission of specimen other than nasopharyngeal swab, presence of viral mutation(s) within the areas targeted by this assay, and inadequate number of viral copies (<250 copies / mL). A negative result must be combined with clinical observations, patient history, and epidemiological information.  Fact Sheet for Patients:   BoilerBrush.com.cy  Fact Sheet for Healthcare Providers: https://pope.com/  This test is not yet approved or  cleared by the [place] and has been authorized for detection and/or diagnosis of SARS-CoV-2 by FDA under an Emergency Use Authorization (EUA).  This EUA will remain in effect (meaning this test can be used) for the duration of the COVID-19 declaration under Section 564(b)(1) of the Act, 21 U.S.C. section 360bbb-3(b)(1), unless the authorization is terminated or revoked sooner.  Performed at [facility], [address] [place], [place] [address]     Allergies: Patient has no known allergies.  PTA Medications: (Not in a hospital admission)   Medical Decision Making  Continue home medications  Duloxetine DR 30 mg BID for depression Gabapentin 300 mg TID for anxiety     Clinical Course as of Sep 16 540  Tue Sep 16, 2020  6045 Replace potassium with oral potassium 20 mEq x 1 dose  Potassium(!): 3.4 [JB]  0538 Hgb elevated at 16.1, CBC otherwise unremarkable  Hemoglobin(!): 16.1 [JB]    Clinical Course User  Index [JB] Jackelyn Poling, NP    Recommendations  Based on my evaluation the patient does not appear to have an emergency medical condition.   Patient will be placed in the continuous assessment area at [facility] for treatment and stabilization. She will be reevaluated on 09/16/2020. The treatment team will determine disposition at that time.      Jackelyn Poling, NP 09/16/20  5:41 AM

## 2020-09-15 NOTE — ED Triage Notes (Signed)
Pt arrives via safe transport from [facility] with c/o continual SI with plan. Pt reports this has been ongoing for months even with medication changes. A&Ox4, calm & cooperative.

## 2020-09-15 NOTE — ED Notes (Signed)
Pt given a salad and a sandwich for a meal.

## 2020-09-16 ENCOUNTER — Encounter (HOSPITAL_COMMUNITY): Payer: Self-pay | Admitting: Psychiatry

## 2020-09-16 ENCOUNTER — Inpatient Hospital Stay (HOSPITAL_COMMUNITY)
Admission: AD | Admit: 2020-09-16 | Discharge: 2020-09-19 | DRG: 885 | Disposition: A | Discharge status: Home / Self Care | Payer: Medicaid Other | Source: Ambulatory Visit | Attending: Psychiatry | Admitting: Psychiatry

## 2020-09-16 DIAGNOSIS — F332 Major depressive disorder, recurrent severe without psychotic features: Principal | ICD-10-CM | POA: Diagnosis present

## 2020-09-16 DIAGNOSIS — F411 Generalized anxiety disorder: Secondary | ICD-10-CM | POA: Diagnosis present

## 2020-09-16 DIAGNOSIS — F419 Anxiety disorder, unspecified: Secondary | ICD-10-CM | POA: Diagnosis present

## 2020-09-16 DIAGNOSIS — F900 Attention-deficit hyperactivity disorder, predominantly inattentive type: Secondary | ICD-10-CM | POA: Diagnosis present

## 2020-09-16 DIAGNOSIS — R45851 Suicidal ideations: Secondary | ICD-10-CM | POA: Diagnosis present

## 2020-09-16 DIAGNOSIS — Z818 Family history of other mental and behavioral disorders: Secondary | ICD-10-CM | POA: Diagnosis not present

## 2020-09-16 DIAGNOSIS — Z23 Encounter for immunization: Secondary | ICD-10-CM

## 2020-09-16 DIAGNOSIS — G4700 Insomnia, unspecified: Secondary | ICD-10-CM | POA: Diagnosis present

## 2020-09-16 LAB — CBC WITH DIFFERENTIAL/PLATELET
Abs Immature Granulocytes: 0.06 10*3/uL (ref 0.00–0.07)
Basophils Absolute: 0 10*3/uL (ref 0.0–0.1)
Basophils Relative: 1 %
Eosinophils Absolute: 0.1 10*3/uL (ref 0.0–0.5)
Eosinophils Relative: 2 %
HCT: 45 % (ref 36.0–46.0)
Hemoglobin: 16.1 g/dL — ABNORMAL HIGH (ref 12.0–15.0)
Immature Granulocytes: 1 %
Lymphocytes Relative: 35 %
Lymphs Abs: 2.5 10*3/uL (ref 0.7–4.0)
MCH: 33.7 pg (ref 26.0–34.0)
MCHC: 35.8 g/dL (ref 30.0–36.0)
MCV: 94.1 fL (ref 80.0–100.0)
Monocytes Absolute: 0.3 10*3/uL (ref 0.1–1.0)
Monocytes Relative: 5 %
Neutro Abs: 4.1 10*3/uL (ref 1.7–7.7)
Neutrophils Relative %: 56 %
Platelets: 276 10*3/uL (ref 150–400)
RBC: 4.78 MIL/uL (ref 3.87–5.11)
RDW: 11.4 % — ABNORMAL LOW (ref 11.5–15.5)
WBC: 7.2 10*3/uL (ref 4.0–10.5)
nRBC: 0 % (ref 0.0–0.2)

## 2020-09-16 LAB — COMPREHENSIVE METABOLIC PANEL
ALT: 22 U/L (ref 0–44)
AST: 19 U/L (ref 15–41)
Albumin: 4 g/dL (ref 3.5–5.0)
Alkaline Phosphatase: 61 U/L (ref 38–126)
Anion gap: 12 (ref 5–15)
BUN: 12 mg/dL (ref 6–20)
CO2: 24 mmol/L (ref 22–32)
Calcium: 9.1 mg/dL (ref 8.9–10.3)
Chloride: 105 mmol/L (ref 98–111)
Creatinine, Ser: 0.73 mg/dL (ref 0.44–1.00)
GFR calc Af Amer: >60 mL/min (ref >60)
GFR calc non Af Amer: >60 mL/min (ref >60)
Glucose, Bld: 63 mg/dL — ABNORMAL LOW (ref 70–99)
Potassium: 3.4 mmol/L — ABNORMAL LOW (ref 3.5–5.1)
Sodium: 141 mmol/L (ref 135–145)
Total Bilirubin: 0.7 mg/dL (ref 0.3–1.2)
Total Protein: 6.8 g/dL (ref 6.5–8.1)

## 2020-09-16 LAB — SARS CORONAVIRUS 2 BY RT PCR (HOSPITAL ORDER, PERFORMED IN ~~LOC~~ HOSPITAL LAB): SARS Coronavirus 2: NEGATIVE

## 2020-09-16 MED ORDER — ALUM & MAG HYDROXIDE-SIMETH 200-200-20 MG/5ML PO SUSP: 30.0000 mL | ORAL | Status: DC | PRN

## 2020-09-16 MED ORDER — NICOTINE 21 MG/24HR TD PT24
21.0000 mg | MEDICATED_PATCH | Freq: Every day | TRANSDERMAL | Status: DC
  Administered 2020-09-17 – 2020-09-19 (×3): 21 mg via TRANSDERMAL
  Filled 2020-09-16 (×6): qty 1

## 2020-09-16 MED ORDER — TRAZODONE HCL 50 MG PO TABS: 50.0000 mg | ORAL_TABLET | Freq: Every evening | ORAL | Status: DC | PRN

## 2020-09-16 MED ORDER — INFLUENZA VAC SPLIT QUAD 0.5 ML IM SUSY
0.5000 mL | PREFILLED_SYRINGE | INTRAMUSCULAR | Status: AC
  Administered 2020-09-17: 0.5 mL via INTRAMUSCULAR
  Filled 2020-09-16: qty 0.5

## 2020-09-16 MED ORDER — HYDROXYZINE HCL 25 MG PO TABS
25.0000 mg | ORAL_TABLET | Freq: Three times a day (TID) | ORAL | Status: DC | PRN
  Administered 2020-09-16 – 2020-09-19 (×6): 25 mg via ORAL
  Filled 2020-09-16 (×8): qty 1

## 2020-09-16 MED ORDER — DULOXETINE HCL 30 MG PO CPEP
30.0000 mg | ORAL_CAPSULE | Freq: Two times a day (BID) | ORAL | Status: DC
  Administered 2020-09-16 – 2020-09-17 (×2): 30 mg via ORAL
  Filled 2020-09-16 (×5): qty 1

## 2020-09-16 MED ORDER — ACETAMINOPHEN 325 MG PO TABS
650.0000 mg | ORAL_TABLET | Freq: Four times a day (QID) | ORAL | Status: DC | PRN
  Administered 2020-09-16: 650 mg via ORAL
  Filled 2020-09-16: qty 2

## 2020-09-16 MED ORDER — GABAPENTIN 300 MG PO CAPS
300.0000 mg | ORAL_CAPSULE | Freq: Three times a day (TID) | ORAL | Status: DC
  Administered 2020-09-16 – 2020-09-19 (×9): 300 mg via ORAL
  Filled 2020-09-16 (×16): qty 1

## 2020-09-16 MED ORDER — TRAZODONE HCL 100 MG PO TABS
100.0000 mg | ORAL_TABLET | Freq: Every evening | ORAL | Status: DC | PRN
  Administered 2020-09-16 – 2020-09-17 (×2): 100 mg via ORAL
  Filled 2020-09-16 (×4): qty 1

## 2020-09-16 MED ORDER — POTASSIUM CHLORIDE CRYS ER 20 MEQ PO TBCR
20.0000 meq | EXTENDED_RELEASE_TABLET | Freq: Once | ORAL | Status: AC
  Administered 2020-09-16: 20 meq via ORAL
  Filled 2020-09-16: qty 1

## 2020-09-16 MED ORDER — MAGNESIUM HYDROXIDE 400 MG/5ML PO SUSP: 30.0000 mL | Freq: Every day | ORAL | Status: DC | PRN

## 2020-09-16 NOTE — ED Notes (Addendum)
Pt continue to endorse passive SI no plan. Pleasant and cooperative with staff. Received all am medication without difficulty. Encouraged pt to notify staff with any needs or concerns. Safety maintained.

## 2020-09-16 NOTE — Progress Notes (Signed)
Patient rated her day as a 3 out of a possible 10. She states that her medication is not working at this time as she is still depressed and anxious. Her goal for tomorrow is to meet with the doctor to discuss her medications.

## 2020-09-16 NOTE — Progress Notes (Signed)
   09/16/20 2100  Psych Admission Type (Psych Patients Only)  Admission Status Voluntary  Psychosocial Assessment  Patient Complaints Anxiety;Depression  Eye Contact Fair  Facial Expression Anxious  Affect Appropriate to circumstance  Speech Logical/coherent  Interaction Assertive  Motor Activity Other (Comment) (wnl)  Appearance/Hygiene Unremarkable  Behavior Characteristics Cooperative;Anxious  Mood Pleasant;Anxious  Thought Process  Coherency WDL  Content WDL  Delusions None reported or observed  Perception WDL  Hallucination None reported or observed  Judgment Impaired  Confusion None  Danger to Self  Current suicidal ideation? Passive  Self-Injurious Behavior Some self-injurious ideation observed or expressed.  No lethal plan expressed   Agreement Not to Harm Self Yes  Description of Agreement verbal agreement to approach staff  Danger to Others  Danger to Others None reported or observed   Pt was here before in July of this year. Pt states that her medication is not working. She has experienced some bad manic days. She states she has an appointment with her therapist in October but felt it prudent not to wait to seek help. Pt rates pain 6/10, anxiety 6/10 and depression 10/10. Since seeking treatment, she has not had contact with her children in person or by phone. Pt wants med management so that she can get back to her family. Pt identifies family members and friends as social support. Pt endorses passive SI but contracts for safety.

## 2020-09-16 NOTE — ED Notes (Signed)
Writer called Lanora Manis, RN at [facility], to give report on pt's acceptance to inpatient unit 300-1. Called safe transport service for transport. Pt Signed Voluntary Consent for Admission and Treatment to [facility]. Safety maintained

## 2020-09-16 NOTE — Tx Team (Signed)
Initial Treatment Plan 09/16/2020 6:40 PM MADALENA KESECKER NIO:[id number]    PATIENT STRESSORS: Legal issue  Family Conflict   PATIENT STRENGTHS: Ability for insight Communication skills   PATIENT IDENTIFIED PROBLEMS: Suicidal ideation  Depression  Ineffective individual coping                 DISCHARGE CRITERIA:  Ability to meet basic life and health needs Motivation to continue treatment in a less acute level of care Verbal commitment to aftercare and medication compliance  PRELIMINARY DISCHARGE PLAN: Outpatient therapy Return to previous living arrangement  PATIENT/FAMILY INVOLVEMENT: This treatment plan has been presented to and reviewed with the patient, Wanda Torres.  The patient has been given the opportunity to ask questions and make suggestions.  Garnette Scheuermann, RN 09/16/2020, 6:40 PM

## 2020-09-16 NOTE — ED Provider Notes (Signed)
FBC/OBS ASAP Discharge Summary  Date and Time: 09/16/2020 3:16 PM  Name: Wanda Torres  MRN:  [id number]   Discharge Diagnoses:  Final diagnoses:  Severe recurrent major depression without psychotic features (HCC)    Subjective: "I feel about the same as yesterday."    Stay Summary: The patient was seen and examined in the consult room. She presents with a flat affect and depressed mood. She appeared guarded throughout the process and provided minimal information. She reports suicidal thoughts with a plan to "cut my wrist." She denies homicidal thoughts. When asked are there any stressors or triggers contributing to her having suicidal thoughts, she stated, "there's a lot going on. Even when I have good days, I have the suicidal thoughts. The thoughts have been more frequently over the last week." She stated that the only thing that helps to reduce the suicidal thoughts, "is sleeping all day."   She gave verbal consent for this provider to obtain collateral information from Ladona Ridgel (friend). Ladona Ridgel stated that she works 12 hour shifts and couldn't commit to [place] coming to stay at her house as a Water engineer. She stated that "[name] doesn't have a good relationship with her family right now and that her family has turned their backs on [name]." Ladona Ridgel stated that she's not sure who else Waynetta Sandy could stay with and that the only other friend that she know of stays in [place].   The patient lives alone and is unable to identify a safety plan or support persons in order to safely discharge home. We discussed treatment options that includes inpatient treatment. She verbally agrees to receive inpatient treatment at this time.  Patient is recommended for inpatient treatment. Patient accepted by Paulla Dolly., to the 300 hall today after 1600. Labs reviewed. Vital signs reviewed. Medications reviewed. Admission orders placed for [facility]  Total Time spent with patient: 30 minutes  Past Psychiatric History: MDD  Past  Medical History:  Past Medical History:  Diagnosis Date  . Acne   . Allergy   . Anemia   . Anxiety   . Depression   . Psoriasis    No past surgical history on file. Family History: No family history on file. Family Psychiatric History: Unknown  Social History:  Social History   Substance and Sexual Activity  Alcohol Use Yes  . Alcohol/week: 0.0 standard drinks     Social History   Substance and Sexual Activity  Drug Use Never    Social History   Socioeconomic History  . Marital status: Divorced    Spouse name: Not on file  . Number of children: Not on file  . Years of education: Not on file  . Highest education level: Not on file  Occupational History    Comment: Supervisor reservations; 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[employer]  Tobacco Use  . Smoking status: Never Smoker  . Smokeless tobacco: Never Used  Vaping Use  . Vaping Use: Every day  . Substances: Nicotine  Substance and Sexual Activity  . Alcohol use: Yes    Alcohol/week: 0.0 standard drinks  . Drug use: Never  . Sexual activity: Not on file  Other Topics Concern  . Not on file  Social History Narrative  . Not on file   Social Determinants of Health   Financial Resource Strain:   . Difficulty of Paying Living Expenses: Not on file  Food Insecurity:   . Worried About Programme researcher, broadcasting/film/video in the Last Year: Not on file  . Ran Out  of Food in the Last Year: Not on file  Transportation Needs:   . Lack of Transportation (Medical): Not on file  . Lack of Transportation (Non-Medical): Not on file  Physical Activity:   . Days of Exercise per Week: Not on file  . Minutes of Exercise per Session: Not on file  Stress:   . Feeling of Stress : Not on file  Social Connections:   . Frequency of Communication with Friends and Family: Not on file  . Frequency of Social Gatherings with Friends and Family: Not on file  . Attends Religious Services: Not on file  . Active Member of Clubs or Organizations: Not on file  .  Attends BankerClub or Organization Meetings: Not on file  . Marital Status: Not on file   SDOH:  SDOH Screenings   Alcohol Screen: Low Risk   . Last Alcohol Screening Score (AUDIT): 3  Depression (PHQ2-9):   . PHQ-2 Score: Not on file  Financial Resource Strain:   . Difficulty of Paying Living Expenses: Not on file  Food Insecurity:   . Worried About Programme researcher, broadcasting/film/videounning Out of Food in the Last Year: Not on file  . Ran Out of Food in the Last Year: Not on file  Housing:   . Last Housing Risk Score: Not on file  Physical Activity:   . Days of Exercise per Week: Not on file  . Minutes of Exercise per Session: Not on file  Social Connections:   . Frequency of Communication with Friends and Family: Not on file  . Frequency of Social Gatherings with Friends and Family: Not on file  . Attends Religious Services: Not on file  . Active Member of Clubs or Organizations: Not on file  . Attends BankerClub or Organization Meetings: Not on file  . Marital Status: Not on file  Stress:   . Feeling of Stress : Not on file  Tobacco Use: Low Risk   . Smoking Tobacco Use: Never Smoker  . Smokeless Tobacco Use: Never Used  Transportation Needs:   . Freight forwarderLack of Transportation (Medical): Not on file  . Lack of Transportation (Non-Medical): Not on file    Has this patient used any form of tobacco in the last 30 days? (Cigarettes, Smokeless Tobacco, Cigars, and/or Pipes) Prescription not provided because:  [facility]  Current Medications:  Current Facility-Administered Medications  Medication Dose Route Frequency Provider Last Rate Last Admin  . acetaminophen (TYLENOL) tablet 650 mg  650 mg Oral Q6H PRN Nira ConnBerry, Jason A, NP      . alum & mag hydroxide-simeth (MAALOX/MYLANTA) 200-200-20 MG/5ML suspension 30 mL  30 mL Oral Q4H PRN Jackelyn PolingBerry, Jason A, NP      . DULoxetine (CYMBALTA) DR capsule 30 mg  30 mg Oral BID Nira ConnBerry, Jason A, NP   30 mg at 09/16/20 0910  . gabapentin (NEURONTIN) capsule 300 mg  300 mg Oral TID  Nira ConnBerry, Jason A, NP   300 mg at 09/16/20 0910  . hydrOXYzine (ATARAX/VISTARIL) tablet 25 mg  25 mg Oral TID PRN Nira ConnBerry, Jason A, NP   25 mg at 09/16/20 1449  . magnesium hydroxide (MILK OF MAGNESIA) suspension 30 mL  30 mL Oral Daily PRN Nira ConnBerry, Jason A, NP      . nicotine (NICODERM CQ - dosed in mg/24 hours) patch 14 mg  14 mg Transdermal Daily Nira ConnBerry, Jason A, NP   14 mg at 09/16/20 0911  . nicotine polacrilex (NICORETTE) gum 2 mg  2 mg Oral Q4H  PRN Jackelyn Poling, NP      . traZODone (DESYREL) tablet 50 mg  50 mg Oral QHS PRN Jackelyn Poling, NP   50 mg at 09/15/20 2321   Current Outpatient Medications  Medication Sig Dispense Refill  . DULoxetine (CYMBALTA) 30 MG capsule Take 1 capsule (30 mg total) by mouth 2 (two) times daily. 60 capsule 1  . gabapentin (NEURONTIN) 300 MG capsule Take 1 capsule (300 mg total) by mouth 3 (three) times daily. 90 capsule 1  . lisdexamfetamine (VYVANSE) 50 MG capsule Take 1 capsule (50 mg total) by mouth daily. 30 capsule 0  . [START ON 09/24/2020] lisdexamfetamine (VYVANSE) 50 MG capsule Take 1 capsule (50 mg total) by mouth daily. (Patient not taking: Reported on 09/16/2020) 30 capsule 0  . neomycin-bacitracin-polymyxin (NEOSPORIN) ointment Apply topically 3 (three) times daily. Applied to affected area (Patient not taking: Reported on 09/16/2020) 7 g 0  . nicotine (NICODERM CQ - DOSED IN MG/24 HOURS) 21 mg/24hr patch Place 1 patch (21 mg total) onto the skin daily. (Patient not taking: Reported on 09/16/2020) 14 patch 0    PTA Medications: (Not in a hospital admission)   Musculoskeletal  Strength & Muscle Tone: within normal limits Gait & Station: normal Patient leans: N/A  Psychiatric Specialty Exam  Presentation  General Appearance: Appropriate for Environment  Eye Contact:Fair  Speech:Clear and Coherent  Speech Volume:Decreased  Handedness:Right   Mood and Affect   Mood:Depressed;Anxious;Hopeless;Worthless  Affect:Congruent;Depressed   Art gallery manager Processes:Coherent  Descriptions of Associations:Intact  Orientation:Full (Time, Place and Person)  Thought Content:Logical  Hallucinations:Hallucinations: None  Ideas of Reference:None  Suicidal Thoughts:Suicidal Thoughts: Yes, Active SI Active Intent and/or Plan: With Intent;With Plan;With Means to Carry Out  Homicidal Thoughts:Homicidal Thoughts: No   Sensorium  Memory:Immediate Good;Recent Good;Remote Good  Judgment:Intact  Insight:Lacking;Fair   Art therapist  Concentration:Fair  Attention Span:Fair  Recall:Good  Progress Energy of Knowledge:Good  Language:Good   Psychomotor Activity  Psychomotor Activity:Psychomotor Activity: Normal   Assets  Assets:Communication Skills;Desire for Improvement;Financial Resources/Insurance;Physical Health   Sleep  Sleep:Sleep: Fair   Physical Exam  Physical Exam Vitals and nursing note reviewed.  Constitutional:      General: She is not in acute distress.    Appearance: She is well-developed.  HENT:     Head: Normocephalic and atraumatic.  Eyes:     Conjunctiva/sclera: Conjunctivae normal.  Cardiovascular:     Rate and Rhythm: Normal rate and regular rhythm.     Heart sounds: No murmur heard.   Pulmonary:     Effort: Pulmonary effort is normal. No respiratory distress.     Breath sounds: Normal breath sounds.  Abdominal:     Palpations: Abdomen is soft.     Tenderness: There is no abdominal tenderness.  Musculoskeletal:        General: Normal range of motion.     Cervical back: Neck supple.  Skin:    General: Skin is warm and dry.  Neurological:     Mental Status: She is alert and oriented to person, place, and time.    Review of Systems  Constitutional: Negative.   HENT: Negative.   Eyes: Negative.   Respiratory: Negative.   Cardiovascular: Negative.   Gastrointestinal: Negative.    Genitourinary: Negative.   Musculoskeletal: Negative.   Skin: Negative.   Neurological: Negative.   Endo/Heme/Allergies: Negative.   Psychiatric/Behavioral: Positive for depression and suicidal ideas.   Blood pressure (!) 138/94, pulse 94, temperature 98.3 F (36.8 C), temperature source  Oral, resp. rate 18, SpO2 99 %, unknown if currently breastfeeding. There is no height or weight on file to calculate BMI.   Disposition: Patient accepted to [facility] [address] per [place], [place].   Gerlene Burdock Jullien Granquist, FNP 09/16/2020, 3:16 PM

## 2020-09-16 NOTE — ED Notes (Signed)
Pt sleeping in no acute distress. Safety maintained. 

## 2020-09-16 NOTE — ED Notes (Signed)
Meal given

## 2020-09-16 NOTE — ED Notes (Addendum)
Pt transferred to [facility] in no acute distress. Optimistic about future. Continued to endorse passive SI no plan. Pt signed voluntary consent for admission and treatment form for [facility]. Safe transport service provided transport to [facility].

## 2020-09-16 NOTE — BH Assessment (Signed)
Per Gretta Arab , [facility], patient has been accepted to [facility], bedbed 301-2; Accepting provider is Dr. Landry Mellow, MD; Attending provider is Dr. Landry Mellow, MD.   Patient can arrive at 4:00pm. Number for report is [phone].     Sharion Dove, RN notified. Reola Calkins, NP notified.       Baldo Daub, MSW, LCSW Clinical Social Worker [facility]

## 2020-09-16 NOTE — Progress Notes (Signed)
Pt is a 29 y.o. female who arrived at [facility] after having been at the [facility] for treatment. Pt presents voluntarily at [facility].  Pt stated that she has SI with a plan to cut her wrists.  Pt said that she is involved in a custody battle with her ex and ex has two of the children while pt's sister has the oldest child at this time.  Pt's children are 1, 3 and 9. Pt said that she vapes, but does not use any illegal drugs.  Pt also stated that she drinks alcohol sporadically.  Pt has had increased depression and anxiety because of the situation with her children and that pt does not currently have custody of children.   RN oriented pt to unit, room, and the staff.  Pt disclosed that she was a patient at [facility] a few months ago.  RN initiated q 15 min safety checks.  Pt signed consent forms and agrees with plan of care.

## 2020-09-17 DIAGNOSIS — F332 Major depressive disorder, recurrent severe without psychotic features: Principal | ICD-10-CM

## 2020-09-17 LAB — ETHANOL: Alcohol, Ethyl (B): <10 mg/dL (ref <10)

## 2020-09-17 LAB — RAPID URINE DRUG SCREEN, HOSP PERFORMED
Amphetamines: NOT DETECTED
Barbiturates: NOT DETECTED
Benzodiazepines: NOT DETECTED
Cocaine: NOT DETECTED
Opiates: NOT DETECTED
Tetrahydrocannabinol: NOT DETECTED

## 2020-09-17 MED ORDER — FOLIC ACID 1 MG PO TABS
1.0000 mg | ORAL_TABLET | Freq: Every day | ORAL | Status: DC
  Administered 2020-09-17 – 2020-09-19 (×3): 1 mg via ORAL
  Filled 2020-09-17 (×6): qty 1

## 2020-09-17 MED ORDER — THIAMINE HCL 100 MG/ML IJ SOLN
100.0000 mg | Freq: Once | INTRAMUSCULAR | Status: AC
  Administered 2020-09-17: 100 mg via INTRAMUSCULAR

## 2020-09-17 MED ORDER — POTASSIUM CHLORIDE CRYS ER 20 MEQ PO TBCR
20.0000 meq | EXTENDED_RELEASE_TABLET | Freq: Once | ORAL | Status: AC
  Administered 2020-09-17: 20 meq via ORAL
  Filled 2020-09-17 (×2): qty 1

## 2020-09-17 MED ORDER — ONDANSETRON 4 MG PO TBDP: 4.0000 mg | ORAL_TABLET | Freq: Four times a day (QID) | ORAL | Status: DC | PRN

## 2020-09-17 MED ORDER — LOPERAMIDE HCL 2 MG PO CAPS: 2.0000 mg | ORAL_CAPSULE | ORAL | Status: DC | PRN

## 2020-09-17 MED ORDER — ADULT MULTIVITAMIN W/MINERALS CH
1.0000 | ORAL_TABLET | Freq: Every day | ORAL | Status: DC
  Administered 2020-09-17 – 2020-09-19 (×3): 1 via ORAL
  Filled 2020-09-17 (×6): qty 1

## 2020-09-17 MED ORDER — DULOXETINE HCL 30 MG PO CPEP
30.0000 mg | ORAL_CAPSULE | Freq: Every day | ORAL | Status: DC
  Administered 2020-09-18 – 2020-09-19 (×2): 30 mg via ORAL
  Filled 2020-09-17 (×4): qty 1

## 2020-09-17 MED ORDER — LORAZEPAM 1 MG PO TABS: 1.0000 mg | ORAL_TABLET | Freq: Four times a day (QID) | ORAL | Status: DC | PRN

## 2020-09-17 MED ORDER — LURASIDONE HCL 40 MG PO TABS
40.0000 mg | ORAL_TABLET | Freq: Every day | ORAL | Status: DC
  Administered 2020-09-17 – 2020-09-18 (×2): 40 mg via ORAL
  Filled 2020-09-17 (×4): qty 1

## 2020-09-17 MED ORDER — THIAMINE HCL 100 MG PO TABS
100.0000 mg | ORAL_TABLET | Freq: Every day | ORAL | Status: DC
  Administered 2020-09-18 – 2020-09-19 (×2): 100 mg via ORAL
  Filled 2020-09-17 (×4): qty 1
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
## 2020-09-17 NOTE — Progress Notes (Signed)
Patient expressed in group that she missed one group but did enjoy the group that focused on options for aftercare. Her goal for tomorrow is to attend the afternoon groups.

## 2020-09-17 NOTE — H&P (Addendum)
Psychiatric Admission Assessment Adult  Patient Identification: Wanda Torres MRN:  [id number] Date of Evaluation:  09/17/2020 Chief Complaint:  MDD (major depressive disorder), recurrent episode, severe ([place]) [F33.2] Principal Diagnosis: <principal problem not specified> Diagnosis:  Active Problems:   MDD (major depressive disorder), recurrent episode, severe (HCC)  History of Present Illness:   Wanda Torres is a 29 yo female with a past medical history significant for MDD, ADHD, and anxiety who was admitted to the [facility] for worsening depression with suicidal ideations, with a plan. Wanda Torres reported to the [facility] yesterday expressing SI and had a plan to take Tylenol, cut her wrist, and get into the shower and bleed to death. She stated that she has been feeling this way for the past week and a half. This morning, she is not experiencing any SI, HI, or AVH. She reports that her depression waxes and wanes throughout the day. She describes her mood as "depressed" and "numb." She endorses lack of interest, decreased energy. She says that her appetite is normal and that prior to this admission her sleep has been fine. However, she did mention that over the past week and a half she has drunk alcohol 3 nights to help her go to sleep. She states her anxiety is well managed. She states that she does not use any substances.  She reports the main stressor in her life if the custody of her 3 children. She reports that following her last admission to [facility] (07/09/20) for worsening depression with SI and self-harm, her children have been in the custody of others. Her 2 youngest children live with her ex, who she only communicates with via text, and her oldest lives with her sister, who refuses to communicate with her.  She has been treated for MDD since she was 69. Prior to her previous admission, she was on Zoloft for depression. During the admission, her medications were changed and patient was  discharged home with duloxetine and gabapentin. Patient reports that when she was discharged from previous admission that her depression and anxiety were managed well,and that she did not have any SI/HI or thoughts of self-harm. At the end of July, she felt like the medications were not working so she went to [facility]. She was there for three days, they did not change any of her medications, she felt like they were not helping her and she began to feel worse, so she left voluntarily. She is being seen at [facility] by Dr. Toy Care. Per Dr. Starleen Arms note, at her last visit on 08/26/2020 her doses of duloxetine and gabapentin were titrated up, and patient was started back on vyvanse for ADHD. Patient reports that her depression was well managed until approximately a week and half ago. Patient has ongoing custody stressor as detailed above, but does not think that this triggered her worsening depression. She can not think of anything that happened to trigger her acute worsening depression and SI.  Of note, has been treatment with prozac, wellbutrin (she felt like it made her crazy, irritable, and angry), with little to no improvement in her depression. Zoloft managed depression well until last [facility] admission.  Associated Signs/Symptoms: Depression Symptoms:  depressed mood, anhedonia, hypersomnia, fatigue, hopelessness, suicidal thoughts with specific plan, Duration of Depression Symptoms: No data recorded (Hypo) Manic Symptoms:  None Anxiety Symptoms:  None Psychotic Symptoms:  None Duration of Psychotic Symptoms: No data recorded PTSD Symptoms: Negative Total Time spent with patient: 30 minutes  Past Psychiatric History: MDD, ADHD, anxiety.  Is the patient at risk to self? Yes.    Has the patient been a risk to self in the past 6 months? Yes.    Has the patient been a risk to self within the distant past? Yes.    Is the patient a risk to others? No.  Has the patient been a risk to others in the past  6 months? No.  Has the patient been a risk to others within the distant past? No.   Prior Inpatient Therapy:   Prior Outpatient Therapy:    Alcohol Screening: 1. How often do you have a drink containing alcohol?: 2 to 4 times a month 2. How many drinks containing alcohol do you have on a typical day when you are drinking?: 1 or 2 3. How often do you have six or more drinks on one occasion?: Never AUDIT-C Score: 2 4. How often during the last year have you found that you were not able to stop drinking once you had started?: Never 5. How often during the last year have you failed to do what was normally expected from you because of drinking?: Never 6. How often during the last year have you needed a first drink in the morning to get yourself going after a heavy drinking session?: Never 7. How often during the last year have you had a feeling of guilt of remorse after drinking?: Never 8. How often during the last year have you been unable to remember what happened the night before because you had been drinking?: Never 9. Have you or someone else been injured as a result of your drinking?: No 10. Has a relative or friend or a doctor or another health worker been concerned about your drinking or suggested you cut down?: No Alcohol Use Disorder Identification Test Final Score (AUDIT): 2 Substance Abuse History in the last 12 months:  No. Consequences of Substance Abuse: Negative Previous Psychotropic Medications: Yes  Psychological Evaluations: Yes  Past Medical History:  Past Medical History:  Diagnosis Date  . Acne   . Allergy   . Anemia   . Anxiety   . Depression   . Psoriasis    History reviewed. No pertinent surgical history. Family History: History reviewed. No pertinent family history. Family Psychiatric  History: Sister has bipolar. Multiple family members with depression. Aunt committed suicide. Tobacco Screening:   Social History:  Social History   Substance and Sexual  Activity  Alcohol Use Yes  . Alcohol/week: 0.0 standard drinks     Social History   Substance and Sexual Activity  Drug Use Never    Additional Social History:                           Allergies:  No Known Allergies Lab Results:  Results for orders placed or performed during the hospital encounter of 09/15/20 (from the past 48 hour(s))  POC SARS Coronavirus 2 Ag-ED - Nasal Swab (BD Veritor Kit)     Status: None (Preliminary result)   Collection Time: 09/15/20 10:09 PM  Result Value Ref Range   SARS Coronavirus 2 Ag Negative Negative  POCT Urine Drug Screen - (ICup)     Status: Abnormal   Collection Time: 09/15/20 10:09 PM  Result Value Ref Range   POC Amphetamine UR None Detected None Detected   POC Secobarbital (BAR) None Detected None Detected   POC Buprenorphine (BUP) None Detected None Detected   POC Oxazepam (BZO) None Detected None  Detected   POC Cocaine UR None Detected None Detected   POC Methamphetamine UR None Detected None Detected   POC Morphine None Detected None Detected   POC Oxycodone UR None Detected None Detected   POC Methadone UR None Detected None Detected   POC Marijuana UR None Detected None Detected  Pregnancy, urine POC     Status: None   Collection Time: 09/15/20 10:10 PM  Result Value Ref Range   Preg Test, Ur NEGATIVE NEGATIVE    Comment:        THE SENSITIVITY OF THIS METHODOLOGY IS >24 mIU/mL   SARS Coronavirus 2 by RT PCR (hospital order, performed in [facility] hospital lab) Nasopharyngeal Nasopharyngeal Swab     Status: None   Collection Time: 09/15/20 10:29 PM   Specimen: Nasopharyngeal Swab  Result Value Ref Range   SARS Coronavirus 2 NEGATIVE NEGATIVE    Comment: (NOTE) SARS-CoV-2 target nucleic acids are NOT DETECTED.  The SARS-CoV-2 RNA is generally detectable in upper and lower respiratory specimens during the acute phase of infection. The lowest concentration of SARS-CoV-2 viral copies this assay can detect is  250 copies / mL. A negative result does not preclude SARS-CoV-2 infection and should not be used as the sole basis for treatment or other patient management decisions.  A negative result may occur with improper specimen collection / handling, submission of specimen other than nasopharyngeal swab, presence of viral mutation(s) within the areas targeted by this assay, and inadequate number of viral copies (<250 copies / mL). A negative result must be combined with clinical observations, patient history, and epidemiological information.  Fact Sheet for Patients:   StrictlyIdeas.no  Fact Sheet for Healthcare Providers: BankingDealers.co.za  This test is not yet approved or  cleared by the [place] FDA and has been authorized for detection and/or diagnosis of SARS-CoV-2 by FDA under an Emergency Use Authorization (EUA).  This EUA will remain in effect (meaning this test can be used) for the duration of the COVID-19 declaration under Section 564(b)(1) of the Act, 21 U.S.C. section 360bbb-3(b)(1), unless the authorization is terminated or revoked sooner.  Performed at [facility], [place] [address], [place], [place] [address]   CBC with Differential/Platelet     Status: Abnormal   Collection Time: 09/15/20 10:29 PM  Result Value Ref Range   WBC 7.2 4.0 - 10.5 K/uL   RBC 4.78 3.87 - 5.11 MIL/uL   Hemoglobin 16.1 (H) 12.0 - 15.0 g/dL   HCT 45.0 36 - 46 %   MCV 94.1 80.0 - 100.0 fL   MCH 33.7 26.0 - 34.0 pg   MCHC 35.8 30.0 - 36.0 g/dL   RDW 11.4 (L) 11.5 - 15.5 %   Platelets 276 150 - 400 K/uL   nRBC 0.0 0.0 - 0.2 %   Neutrophils Relative % 56 %   Neutro Abs 4.1 1.7 - 7.7 K/uL   Lymphocytes Relative 35 %   Lymphs Abs 2.5 0.7 - 4.0 K/uL   Monocytes Relative 5 %   Monocytes Absolute 0.3 0 - 1 K/uL   Eosinophils Relative 2 %   Eosinophils Absolute 0.1 0 - 0 K/uL   Basophils Relative 1 %   Basophils Absolute 0.0 0 - 0 K/uL    Immature Granulocytes 1 %   Abs Immature Granulocytes 0.06 0.00 - 0.07 K/uL    Comment: Performed at [facility], [address] [place], [place] [address]  Comprehensive metabolic panel  Status: Abnormal   Collection Time: 09/15/20 10:29 PM  Result Value Ref Range   Sodium 141 135 - 145 mmol/L   Potassium 3.4 (L) 3.5 - 5.1 mmol/L   Chloride 105 98 - 111 mmol/L   CO2 24 22 - 32 mmol/L   Glucose, Bld 63 (L) 70 - 99 mg/dL    Comment: Glucose reference range applies only to samples taken after fasting for at least 8 hours.   BUN 12 6 - 20 mg/dL   Creatinine, Ser 0.73 0.44 - 1.00 mg/dL   Calcium 9.1 8.9 - 10.3 mg/dL   Total Protein 6.8 6.5 - 8.1 g/dL   Albumin 4.0 3.5 - 5.0 g/dL   AST 19 15 - 41 U/L   ALT 22 0 - 44 U/L   Alkaline Phosphatase 61 38 - 126 U/L   Total Bilirubin 0.7 0.3 - 1.2 mg/dL   GFR calc non Af Amer >60 >60 mL/min   GFR calc Af Amer >60 >60 mL/min   Anion gap 12 5 - 15    Comment: Performed at [facility], [place] [address], [place], [place] [address]    Blood Alcohol level:  Lab Results  Component Value Date   ETH <10 07/09/2020   ETH  08/11/2007    <5        LOWEST DETECTABLE LIMIT FOR SERUM ALCOHOL IS 11 mg/dL FOR MEDICAL PURPOSES ONLY    Metabolic Disorder Labs:  Lab Results  Component Value Date   HGBA1C 4.4 (L) 07/09/2020   MPG 79.58 07/09/2020   No results found for: PROLACTIN Lab Results  Component Value Date   CHOL 131 07/09/2020   TRIG 143 07/09/2020   HDL 59 07/09/2020   CHOLHDL 2.2 07/09/2020   VLDL 29 07/09/2020   LDLCALC 43 07/09/2020    Current Medications: Current Facility-Administered Medications  Medication Dose Route Frequency Provider Last Rate Last Admin  . acetaminophen (TYLENOL) tablet 650 mg  650 mg Oral Q6H PRN Money, Lowry Ram, FNP   650 mg at 09/16/20 2105  . alum & mag hydroxide-simeth (MAALOX/MYLANTA) 200-200-20 MG/5ML suspension 30 mL  30 mL Oral Q4H PRN Money, Lowry Ram, FNP      . DULoxetine  (CYMBALTA) DR capsule 30 mg  30 mg Oral BID Money, Lowry Ram, FNP   30 mg at 09/17/20 0801  . gabapentin (NEURONTIN) capsule 300 mg  300 mg Oral TID Money, Lowry Ram, FNP   300 mg at 09/17/20 0801  . hydrOXYzine (ATARAX/VISTARIL) tablet 25 mg  25 mg Oral TID PRN Money, Lowry Ram, FNP   25 mg at 09/17/20 0802  . influenza vac split quadrivalent PF (FLUARIX) injection 0.5 mL  0.5 mL Intramuscular Tomorrow-1000 Lindon Romp A, NP      . magnesium hydroxide (MILK OF MAGNESIA) suspension 30 mL  30 mL Oral Daily PRN Money, Darnelle Maffucci B, FNP      . nicotine (NICODERM CQ - dosed in mg/24 hours) patch 21 mg  21 mg Transdermal Daily Lindon Romp A, NP   21 mg at 09/17/20 0643  . traZODone (DESYREL) tablet 100 mg  100 mg Oral QHS PRN Sharma Covert, MD   100 mg at 09/16/20 2105   PTA Medications: No medications prior to admission.    Musculoskeletal: Strength & Muscle Tone: within normal limits Gait & Station: normal Patient leans: N/A  Psychiatric Specialty Exam: Physical Exam HENT:     Head: Normocephalic and atraumatic.     Mouth/Throat:  Mouth: Mucous membranes are moist.  Eyes:     Extraocular Movements: Extraocular movements intact.  Pulmonary:     Effort: Pulmonary effort is normal.  Neurological:     Mental Status: She is alert.  Psychiatric:        Attention and Perception: Attention and perception normal.        Mood and Affect: Affect normal.        Speech: Speech normal.        Behavior: Behavior normal. Behavior is cooperative.        Thought Content: Thought content normal. Thought content does not include homicidal or suicidal ideation. Thought content does not include suicidal plan.        Cognition and Memory: Cognition and memory normal.     Review of Systems  Gastrointestinal: Negative for constipation, diarrhea, nausea and vomiting.  Psychiatric/Behavioral: Negative for agitation, hallucinations and suicidal ideas.    Blood pressure (!) 106/53, pulse (!) 102,  temperature 97.6 F (36.4 C), temperature source Oral, resp. rate 18, height 6' (1.829 m), weight 126.1 kg, SpO2 98 %, unknown if currently breastfeeding.Body mass index is 37.7 kg/m.  General Appearance: Well Groomed  Eye Contact:  Good  Speech:  Clear and Coherent and Normal Rate  Volume:  Normal  Mood:  Depressed  Affect:  Appropriate, Congruent and Full Range  Thought Process:  Coherent, Goal Directed and Linear  Orientation:  Full (Time, Place, and Person)  Thought Content:  Logical  Suicidal Thoughts:  No  Homicidal Thoughts:  No  Memory:  Immediate;   Good Recent;   Good Remote;   Good  Judgement:  Fair  Insight:  Good  Psychomotor Activity:  Normal  Concentration:  Concentration: Good  Recall:  Good  Fund of Knowledge:  Good  Language:  Good  Akathisia:  Negative  Handed:  Right  AIMS (if indicated):     Assets:  Desire for Improvement Social Support  ADL's:  Intact  Cognition:  WNL  Sleep:  Number of Hours: 6.75    Assessment: Benjamine Mola "Wanda Torres" Dottavio with past medical history of MDD, ADHD, and anxiety who was admitted to [facility] for worsening depression and suicidal ideations with a plan. Patient stated that she feels like her medications are not helping. Discussed with patient changing medications to help with depression as she is at maximum recommended dose of duloxetine (30 mg BID). She reports that her anxiety is well controlled with the Gabapentin, so we will continue that medication. She agreed with the plan. She is encouraged to attend and participate in group activities while admitted. Patient has reported increased drinking alcohol in the past week and a half. No ethanol labs were ordered on patient presentation so unsure of EtOH levels on arrival. Will initiate CWOL protocol and supplement with folate and thiamine. Labs were reviewed including, CMP, CBC w/ diff, and UDS. Labs remarkable for potassium slightly low at 3.4, will supplement with potassium chloride. Hbg  16.1, was 14.4 and 15.8 2 and 4 years ago, respectively.  Treatment Plan Summary: Daily contact with patient to assess and evaluate symptoms and progress in treatment, Medication management and Plan :   1. Change Duloxetine at 30 mg Daily 2. Start Latuda 40 mg at [place]  3. Continue Gabapentin 300 mg TID. 4. Folic Acid 1 mg daily 5. Thiamine IM injection 100 mg. 6. Thiamin Tablet 100 mg from tomorrow 7. Potassium Chloride 20 mEq once for low K (3.4) 8. Start Zofran ODT 4 mg 6  hourly PRN for Nausea or Vomiting.  9. Start Ativan 1 mg Q6H PRN for withdrawal symptoms if CIWA >10 10. Start Imodium 2-4 mg oral as needed for diarrhea.  11. Order UDS, Alcohol Level PRN medications 1. Trazodone 50 mg QHS PRN for sleep. 2. Hydroxyzine 25 mg TID PRN for anxiety.   Observation Level/Precautions:  Continuous Observation  Laboratory:  UDS Urine Pregnancy  Alcohol level     Psychotherapy:    Medications:  Duloxetine, Gabapentin  Consultations:    Discharge Concerns:    Estimated LOS:  Other:     Physician Treatment Plan for Primary Diagnosis: MDD with suicidal ideations with a plan. Long Term Goal(s): Improvement in symptoms so as ready for discharge  Short Term Goals: Ability to identify changes in lifestyle to reduce recurrence of condition will improve, Ability to verbalize feelings will improve and Ability to disclose and discuss suicidal ideas  Physician Treatment Plan for Secondary Diagnosis: Active Problems:   MDD (major depressive disorder), recurrent episode, severe ([place])  Long Term Goal(s): Improvement in symptoms so as ready for discharge  Short Term Goals: Ability to identify changes in lifestyle to reduce recurrence of condition will improve, Ability to verbalize feelings will improve and Ability to disclose and discuss suicidal ideas  I certify that inpatient services furnished can reasonably be expected to improve the patient's condition.    Fredderick Severance, Medical  Student 9/22/20219:51 AM

## 2020-09-17 NOTE — Progress Notes (Signed)
   09/17/20 2045  Psych Admission Type (Psych Patients Only)  Admission Status Voluntary  Psychosocial Assessment  Patient Complaints Anxiety;Depression;Panic attack  Eye Contact Fair  Facial Expression Anxious  Affect Appropriate to circumstance  Speech Logical/coherent  Interaction Assertive  Motor Activity Other (Comment) (wnl)  Appearance/Hygiene Unremarkable  Behavior Characteristics Cooperative;Anxious  Mood Pleasant;Anxious  Thought Process  Coherency WDL  Content WDL  Delusions None reported or observed  Perception WDL  Hallucination None reported or observed  Judgment Poor  Confusion None  Danger to Self  Current suicidal ideation? Denies  Danger to Others  Danger to Others None reported or observed   Pt rates anxiety 7/10 and depression 7/10. Pt states that she was at the beginning of a panic attack earlier in the day but caught it soon enough that she came and told the nurse and was given Vistaril 25 mg. She states that Vistaril works to take the edge off initially but believes she will need something different to manage her anxiety long term. Pt also talked about needing a lawyer to assist with custody issues regarding her 3 children. Pt hopeful and optimistic.

## 2020-09-17 NOTE — Tx Team (Signed)
Interdisciplinary Treatment and Diagnostic Plan Update  09/17/2020 Time of Session: [place] MRN: [id number]  Principal Diagnosis: <principal problem not specified>  Secondary Diagnoses: Active Problems:   MDD (major depressive disorder), recurrent episode, severe (HCC)   Current Medications:  Current Facility-Administered Medications  Medication Dose Route Frequency Provider Last Rate Last Admin  . acetaminophen (TYLENOL) tablet 650 mg  650 mg Oral Q6H PRN Money, Lowry Ram, FNP   650 mg at 09/16/20 2105  . alum & mag hydroxide-simeth (MAALOX/MYLANTA) 200-200-20 MG/5ML suspension 30 mL  30 mL Oral Q4H PRN Money, Lowry Ram, FNP      . DULoxetine (CYMBALTA) DR capsule 30 mg  30 mg Oral BID Money, Lowry Ram, FNP   30 mg at 09/17/20 0801  . gabapentin (NEURONTIN) capsule 300 mg  300 mg Oral TID Money, Lowry Ram, FNP   300 mg at 09/17/20 0801  . hydrOXYzine (ATARAX/VISTARIL) tablet 25 mg  25 mg Oral TID PRN Money, Lowry Ram, FNP   25 mg at 09/17/20 0802  . influenza vac split quadrivalent PF (FLUARIX) injection 0.5 mL  0.5 mL Intramuscular Tomorrow-1000 Lindon Romp A, NP      . magnesium hydroxide (MILK OF MAGNESIA) suspension 30 mL  30 mL Oral Daily PRN Money, Darnelle Maffucci B, FNP      . nicotine (NICODERM CQ - dosed in mg/24 hours) patch 21 mg  21 mg Transdermal Daily Lindon Romp A, NP   21 mg at 09/17/20 0643  . traZODone (DESYREL) tablet 100 mg  100 mg Oral QHS PRN Sharma Covert, MD   100 mg at 09/16/20 2105   PTA Medications: No medications prior to admission.    Patient Stressors: Legal issue  Patient Strengths: Ability for insight Communication skills  Treatment Modalities: Medication Management, Group therapy, Case management,  1 to 1 session with clinician, Psychoeducation, Recreational therapy.   Physician Treatment Plan for Primary Diagnosis: <principal problem not specified> Long Term Goal(s):     Short Term Goals:    Medication Management: Evaluate  patient's response, side effects, and tolerance of medication regimen.  Therapeutic Interventions: 1 to 1 sessions, Unit Group sessions and Medication administration.  Evaluation of Outcomes: Not Met  Physician Treatment Plan for Secondary Diagnosis: Active Problems:   MDD (major depressive disorder), recurrent episode, severe ([place])  Long Term Goal(s):     Short Term Goals:       Medication Management: Evaluate patient's response, side effects, and tolerance of medication regimen.  Therapeutic Interventions: 1 to 1 sessions, Unit Group sessions and Medication administration.  Evaluation of Outcomes: Not Met   RN Treatment Plan for Primary Diagnosis: <principal problem not specified> Long Term Goal(s): Knowledge of disease and therapeutic regimen to maintain health will improve  Short Term Goals: Ability to demonstrate self-control, Ability to verbalize feelings will improve and Ability to disclose and discuss suicidal ideas  Medication Management: RN will administer medications as ordered by provider, will assess and evaluate patient's response and provide education to patient for prescribed medication. RN will report any adverse and/or side effects to prescribing provider.  Therapeutic Interventions: 1 on 1 counseling sessions, Psychoeducation, Medication administration, Evaluate responses to treatment, Monitor vital signs and CBGs as ordered, Perform/monitor CIWA, COWS, AIMS and Fall Risk screenings as ordered, Perform wound care treatments as ordered.  Evaluation of Outcomes: Not Met   LCSW Treatment Plan for Primary Diagnosis: <principal problem not specified> Long Term Goal(s): Safe transition to appropriate next level of care at discharge, Engage  patient in therapeutic group addressing interpersonal concerns.  Short Term Goals: Engage patient in aftercare planning with referrals and resources, Increase emotional regulation, Identify triggers associated with mental  health/substance abuse issues and Increase skills for wellness and recovery  Therapeutic Interventions: Assess for all discharge needs, 1 to 1 time with Social worker, Explore available resources and support systems, Assess for adequacy in community support network, Educate family and significant other(s) on suicide prevention, Complete Psychosocial Assessment, Interpersonal group therapy.  Evaluation of Outcomes: Not Met   Progress in Treatment: Attending groups: Yes. Participating in groups: Yes. Taking medication as prescribed: Yes. Toleration medication: Yes. Family/Significant other contact made: No, will contact:  SW to assess with Pt. Patient understands diagnosis: Yes. Discussing patient identified problems/goals with staff: Yes. Medical problems stabilized or resolved: Yes. Denies suicidal/homicidal ideation: Yes. Issues/concerns per patient self-inventory: No. Other: None  New problem(s) identified: No, Describe:  None  New Short Term/Long Term Goal(s): SW will assist Pt in finding PHP program that meets her needs. SW encouraged Pt to attend groups to learn new and transferable coping skills.  Patient Goals: "I just need my medication changed or adjusted"  Discharge Plan or Barriers: SW will continue to assess.  Reason for Continuation of Hospitalization: Anxiety Depression Medication stabilization  Estimated Length of Stay: 3-5 Days  Attendees: Patient: Wanda Torres 09/17/2020 10:45 AM  Physician: Dr. Mallie Darting 09/17/2020 10:45 AM  Nursing:  09/17/2020 10:45 AM  RN Care Manager: 09/17/2020 10:45 AM  Social Worker: Freddi Che, LCSE 09/17/2020 10:45 AM  Recreational Therapist:  09/17/2020 10:45 AM  Other:  09/17/2020 10:45 AM  Other:  09/17/2020 10:45 AM  Other: 09/17/2020 10:45 AM    Scribe for Treatment Team: Freddi Che, LCSW 09/17/2020 10:45 AM

## 2020-09-17 NOTE — [facility] Suicide Risk Assessment (Signed)
[facility] Admission Suicide Risk Assessment   Nursing information obtained from:  Patient Demographic factors:  Living alone, Caucasian Current Mental Status:  Self-harm thoughts Loss Factors:  Loss of significant relationship, Legal issues Historical Factors:  Impulsivity Risk Reduction Factors:  Responsible for children under 30 years of age  Total Time spent with patient: 30 minutes Principal Problem: <principal problem not specified> Diagnosis:  Active Problems:   MDD (major depressive disorder), recurrent episode, severe (HCC)  Subjective Data: Patient is seen and examined.  Patient is a 29 year old female with a past psychiatric history significant for major depression and generalized anxiety disorder familiar to me from a previous psychiatric hospitalization here in July 2021 who presented to the behavioral health urgent care center on 09/15/2020 with suicidal ideation.  The patient stated that she had been doing relatively well but was still having some depressive symptoms after discharge.  She had been discharged on duloxetine 30 mg a day.  She had followed up with her outpatient psychiatrist (Dr. Evelene Croon) who had increased her duloxetine to 30 mg p.o. twice daily.  The patient stated that 2 to 3 days after that she had begun to develop suicidal ideation, and it continued to worsen.  She then presented to the behavioral health urgent care center for evaluation.  The patient minimized her stressors, but stated that after she had gotten out of the hospital her separated spouse had gotten emergent custody of their children.  She stated that he had used her suicidal ideation and her psychiatric hospitalization against her and had gotten custody.  The patient denied whether or not child protective services or the Department of Social Services was involved in the case.  She stated that on the date that she had followed up and when she had presented to the behavioral health urgent care center there had been  no new stressors.  She stated there had not been any contact with her separated spouse or difficulty with anything with regard to that issue.  She stated because of her financial status she is unable to afford a lawyer, but her separated spouse does have a Clinical research associate.  Also which should be mentioned, and her hospitalization in July 2000 21 part of the process was that her husband had come out as transgender, and that it caused great chaos in her life.  The decision was made to admit her to the hospital for evaluation and stabilization.  Continued Clinical Symptoms:  Alcohol Use Disorder Identification Test Final Score (AUDIT): 2 The "Alcohol Use Disorders Identification Test", Guidelines for Use in Primary Care, Second Edition.  World Science writer [facility]). Score between 0-7:  no or low risk or alcohol related problems. Score between 8-15:  moderate risk of alcohol related problems. Score between 16-19:  high risk of alcohol related problems. Score 20 or above:  warrants further diagnostic evaluation for alcohol dependence and treatment.   CLINICAL FACTORS:   Severe Anxiety and/or Agitation Depression:   Anhedonia Hopelessness Impulsivity   Musculoskeletal: Strength & Muscle Tone: within normal limits Gait & Station: normal Patient leans: N/A  Psychiatric Specialty Exam: Physical Exam Vitals and nursing note reviewed.  Constitutional:      Appearance: Normal appearance.  HENT:     Head: Normocephalic and atraumatic.  Pulmonary:     Effort: Pulmonary effort is normal.  Neurological:     General: No focal deficit present.     Mental Status: She is alert and oriented to person, place, and time.     Review  of Systems  Blood pressure (!) 106/53, pulse (!) 102, temperature 97.6 F (36.4 C), temperature source Oral, resp. rate 18, height 6' (1.829 m), weight 126.1 kg, SpO2 98 %, unknown if currently breastfeeding.Body mass index is 37.7 kg/m.  General Appearance: Casual  Eye  Contact:  Fair  Speech:  Normal Rate  Volume:  Normal  Mood:  Anxious and Depressed  Affect:  Congruent  Thought Process:  Coherent and Descriptions of Associations: Intact  Orientation:  Full (Time, Place, and Person)  Thought Content:  Logical  Suicidal Thoughts:  Yes.  without intent/plan  Homicidal Thoughts:  No  Memory:  Immediate;   Fair Recent;   Fair Remote;   Fair  Judgement:  Intact  Insight:  Fair  Psychomotor Activity:  Normal  Concentration:  Concentration: Fair and Attention Span: Fair  Recall:  Good  Fund of Knowledge:  Good  Language:  Good  Akathisia:  Negative  Handed:  Right  AIMS (if indicated):     Assets:  Communication Skills Desire for Improvement Housing Resilience Talents/Skills  ADL's:  Intact  Cognition:  WNL  Sleep:  Number of Hours: 6.75      COGNITIVE FEATURES THAT CONTRIBUTE TO RISK:  None    SUICIDE RISK:   Moderate:  Frequent suicidal ideation with limited intensity, and duration, some specificity in terms of plans, no associated intent, good self-control, limited dysphoria/symptomatology, some risk factors present, and identifiable protective factors, including available and accessible social support.  PLAN OF CARE: Patient is seen and examined.  Patient is a 29 year old female with the above-stated past psychiatric history who was admitted secondary to worsening depression and suicidal ideation.  She will be admitted to the hospital.  She will be integrated in the milieu.  She will be encouraged to attend groups.  She has been previously treated for depression unsuccessfully with Zoloft, Wellbutrin and other antidepressants.  She had been treated previously with Wellbutrin but that led to worsening suicidal ideation.  She stated that her suicidal thoughts got worse after the increase in the duloxetine.  We will reduce that dosage down to 30 mg a day.  We discussed several augmentation strategies including Lamictal, Depakote, Abilify and  Latuda.  She is in agreement with a trial of Latuda.  We will start 40 mg p.o. AC dinner.  No other changes in her medications at this point.  Review of her admission laboratories revealed essentially normal electrolytes except for a mildly low potassium at 3.4, and her blood sugar was low at 63.  Liver function enzymes were normal.  Her CBC was essentially normal.  Pregnancy test was negative.  Drug screen was negative.  Her vital signs are stable, she is afebrile.  We will get an EKG given the fact that we are starting the Latuda.  I certify that inpatient services furnished can reasonably be expected to improve the patient's condition.   Antonieta Pert, MD 09/17/2020, 11:34 AM

## 2020-09-18 MED ORDER — MIRTAZAPINE 15 MG PO TABS
15.0000 mg | ORAL_TABLET | Freq: Every day | ORAL | Status: DC
  Filled 2020-09-18 (×3): qty 1

## 2020-09-18 NOTE — Progress Notes (Signed)
[facility] MD Progress Note  09/18/2020 8:58 AM ZENIYAH PEASTER  MRN:  [id number] Subjective:  Pt is seen and examined today. Pt states her mood is good. She rates her mood at 8/10 (10 is the best mood). Pt states she didn't sleep well last night and woke up almost every hour. She states she had a lot of anxiety yesterday and had to take Hyroxyzine at night.  Pt states her appetite is ok. Pt rates her anxiety at 4-5 /10 (10 is the worst anxiety) Currently, Pt denies any suicidal ideation, homicidal ideation and, visual and auditory hallucination. She is not reporting any withdrawal symptoms. She states she is starting her phlebotomy program on Sep 27 th and she thinks its going to help her as she will be busy. She states she will get custody of her oldest daughter on 29 rd October from her sister who has emergency custody right now. She states she is worried about the custody of her 2 younger children from her Ex. She states she has to hire a Clinical research associate for that. Pt denies any headache, nausea, vomiting, dizziness, chest pain, SOB, abdominal pain, diarrhea, and constipation. Pt denies any medication side effects and has been tolerating it well. Pt denies any concerns.  Objective: Patient is a 29 year old female with a past psychiatric history significant for major depression and generalized anxiety disorder familiar to me from a previous psychiatric hospitalization here in July 2021 who presented to the behavioral health urgent care center on 09/15/2020 with suicidal ideation.  Today, Pt looks better. She is anxious, cooperative and oriented x 4. She appears fairly groomed. Her speech is normal with normal rate. Her mood is anxious and dysphoric affect is congruent. She doesn't appear to be responding to any internal stimuli. No SI, HI and AVH.Marland Kitchen Nursing notes indicate that Pt slept for 6.25 hours. She has been attending groups and find it helpful. Nursing note indicate that yesterday night, Pt stated that she was at the  beginning of a panic attack earlier in the day but caught it soon enough that she came and told the nurse and was given Vistaril 25 mg.  We reduced Cymbalta to 30 mg daily and added Latuda 40 mg with supper. She is also taking Neurontin 300 mg TID for anxiety. Urine toxicology is negative. Alcohol level <10. EKG shows sinus tachycardia other wise normal EKG. Qtc- 348/459. Collateral from Ples Specter @336 - - Aunt states Darla suffered post partum depression after 2 of her younger children. At that time she didn't take any medication because she was breastfeeding. She feels that she went downhill for about 2 years. She states Lenah suffered ACL after her accident in 2019. After that she was isolated and bed ridden for some time and then COVID started which made things worse. She states Lovelee was not taking care of herself and it was difficult for her to take care of her children. She states her Ex doesn't let her talk to her younger daughters and she gets depressed because of that. She confirmed that her Aunt is bipolar and had suicidal attempt and ideations. She confirmed and denies her access to guns.  Principal Problem: <principal problem not specified> Diagnosis: Active Problems:   MDD (major depressive disorder), recurrent episode, severe (HCC)  Total Time spent with patient: 20 minutes  Past Psychiatric History: see H&P  Past Medical History:  Past Medical History:  Diagnosis Date  . Acne   . Allergy   . Anemia   . Anxiety   .  Depression   . Psoriasis    History reviewed. No pertinent surgical history. Family History: History reviewed. No pertinent family history. Family Psychiatric  History: See H&P Social History:  Social History   Substance and Sexual Activity  Alcohol Use Yes  . Alcohol/week: 0.0 standard drinks     Social History   Substance and Sexual Activity  Drug Use Never    Social History   Socioeconomic History  . Marital status: Divorced     Spouse name: Not on file  . Number of children: Not on file  . Years of education: Not on file  . Highest education level: Not on file  Occupational History    Comment: Supervisor reservations; 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[employer]  Tobacco Use  . Smoking status: Never Smoker  . Smokeless tobacco: Never Used  Vaping Use  . Vaping Use: Every day  . Substances: Nicotine  Substance and Sexual Activity  . Alcohol use: Yes    Alcohol/week: 0.0 standard drinks  . Drug use: Never  . Sexual activity: Not on file  Other Topics Concern  . Not on file  Social History Narrative  . Not on file   Social Determinants of Health   Financial Resource Strain:   . Difficulty of Paying Living Expenses: Not on file  Food Insecurity:   . Worried About Programme researcher, broadcasting/film/video in the Last Year: Not on file  . Ran Out of Food in the Last Year: Not on file  Transportation Needs:   . Lack of Transportation (Medical): Not on file  . Lack of Transportation (Non-Medical): Not on file  Physical Activity:   . Days of Exercise per Week: Not on file  . Minutes of Exercise per Session: Not on file  Stress:   . Feeling of Stress : Not on file  Social Connections:   . Frequency of Communication with Friends and Family: Not on file  . Frequency of Social Gatherings with Friends and Family: Not on file  . Attends Religious Services: Not on file  . Active Member of Clubs or Organizations: Not on file  . Attends Banker Meetings: Not on file  . Marital Status: Not on file   Additional Social History:                         Sleep: Fair  Appetite:  Fair  Current Medications: Current Facility-Administered Medications  Medication Dose Route Frequency Provider Last Rate Last Admin  . acetaminophen (TYLENOL) tablet 650 mg  650 mg Oral Q6H PRN Money, Gerlene Burdock, FNP   650 mg at 09/16/20 2105  . alum & mag hydroxide-simeth (MAALOX/MYLANTA) 200-200-20 MG/5ML suspension 30 mL  30 mL Oral Q4H PRN Money,  Gerlene Burdock, FNP      . DULoxetine (CYMBALTA) DR capsule 30 mg  30 mg Oral Daily Antonieta Pert, MD   30 mg at 09/18/20 0819  . folic acid (FOLVITE) tablet 1 mg  1 mg Oral Daily Karsten Ro, MD   1 mg at 09/18/20 0820  . gabapentin (NEURONTIN) capsule 300 mg  300 mg Oral TID Money, Gerlene Burdock, FNP   300 mg at 09/18/20 0820  . hydrOXYzine (ATARAX/VISTARIL) tablet 25 mg  25 mg Oral TID PRN Money, Gerlene Burdock, FNP   25 mg at 09/18/20 0245  . loperamide (IMODIUM) capsule 2-4 mg  2-4 mg Oral PRN Karsten Ro, MD      . LORazepam (ATIVAN) tablet 1 mg  1 mg Oral Q6H PRN Karsten Rooda, Leather Estis, MD      . lurasidone (LATUDA) tablet 40 mg  40 mg Oral Q supper Antonieta Pertlary, Greg Lawson, MD   40 mg at 09/17/20 1734  . magnesium hydroxide (MILK OF MAGNESIA) suspension 30 mL  30 mL Oral Daily PRN Money, Feliz Beamravis B, FNP      . multivitamin with minerals tablet 1 tablet  1 tablet Oral Daily Karsten Rooda, Ariya Bohannon, MD   1 tablet at 09/18/20 0819  . nicotine (NICODERM CQ - dosed in mg/24 hours) patch 21 mg  21 mg Transdermal Daily Nira ConnBerry, Jason A, NP   21 mg at 09/18/20 0818  . ondansetron (ZOFRAN-ODT) disintegrating tablet 4 mg  4 mg Oral Q6H PRN Karsten Rooda, Annalia Metzger, MD      . thiamine tablet 100 mg  100 mg Oral Daily Karsten Rooda, Shanna Strength, MD   100 mg at 09/18/20 0820  . traZODone (DESYREL) tablet 100 mg  100 mg Oral QHS PRN Antonieta Pertlary, Greg Lawson, MD   100 mg at 09/17/20 2143    Lab Results:  Results for orders placed or performed during the hospital encounter of 09/16/20 (from the past 48 hour(s))  Urine rapid drug screen (hosp performed)     Status: None   Collection Time: 09/17/20 11:25 AM  Result Value Ref Range   Opiates NONE DETECTED NONE DETECTED   Cocaine NONE DETECTED NONE DETECTED   Benzodiazepines NONE DETECTED NONE DETECTED   Amphetamines NONE DETECTED NONE DETECTED   Tetrahydrocannabinol NONE DETECTED NONE DETECTED   Barbiturates NONE DETECTED NONE DETECTED    Comment: (NOTE) DRUG SCREEN FOR MEDICAL PURPOSES ONLY.  IF CONFIRMATION IS  NEEDED FOR ANY PURPOSE, NOTIFY LAB WITHIN 5 DAYS.  LOWEST DETECTABLE LIMITS FOR URINE DRUG SCREEN Drug Class                     Cutoff (ng/mL) Amphetamine and metabolites    1000 Barbiturate and metabolites    200 Benzodiazepine                 200 Tricyclics and metabolites     300 Opiates and metabolites        300 Cocaine and metabolites        300 THC                            50 Performed at [facility], [address] [place], [place] [address]   Ethanol     Status: None   Collection Time: 09/17/20  5:51 PM  Result Value Ref Range   Alcohol, Ethyl (B) <10 <10 mg/dL    Comment: (NOTE) Lowest detectable limit for serum alcohol is 10 mg/dL.  For medical purposes only. Performed at [facility], [address] [place], [place] [address]     Blood Alcohol level:  Lab Results  Component Value Date   [facility]   ETH <10 07/09/2020    Metabolic Disorder Labs: Lab Results  Component Value Date   HGBA1C 4.4 (L) 07/09/2020   MPG 79.58 07/09/2020   No results found for: PROLACTIN Lab Results  Component Value Date   CHOL 131 07/09/2020   TRIG 143 07/09/2020   HDL 59 07/09/2020   CHOLHDL 2.2 07/09/2020   VLDL 29 07/09/2020   LDLCALC 43 07/09/2020    Physical Findings: AIMS:  , ,  ,  ,    CIWA:  CIWA-Ar  Total: 0 COWS:     Musculoskeletal: Strength & Muscle Tone: within normal limits Gait & Station: normal Patient leans: N/A  Psychiatric Specialty Exam: Physical Exam Vitals and nursing note reviewed.  Constitutional:      General: She is not in acute distress.    Appearance: Normal appearance. She is not ill-appearing, toxic-appearing or diaphoretic.  HENT:     Head: Normocephalic and atraumatic.  Pulmonary:     Effort: Pulmonary effort is normal.  Neurological:     General: No focal deficit present.     Mental Status: She is alert and oriented to person, place, and time.     Review of Systems   Constitutional: Negative for activity change, appetite change, fatigue and fever.  Respiratory: Negative.  Negative for apnea, chest tightness and shortness of breath.   Cardiovascular: Negative.  Negative for chest pain and palpitations.  Gastrointestinal: Negative.  Negative for abdominal pain.  Neurological: Negative.   Psychiatric/Behavioral: Positive for dysphoric mood and sleep disturbance. Negative for hallucinations and suicidal ideas. The patient is nervous/anxious.     Blood pressure (!) 117/46, pulse (!) 108, temperature 97.8 F (36.6 C), temperature source Oral, resp. rate 18, height 6' (1.829 m), weight 126.1 kg, SpO2 98 %, unknown if currently breastfeeding.Body mass index is 37.7 kg/m.  General Appearance: Fairly Groomed  Eye Contact:  Good  Speech:  Normal Rate  Volume:  Normal  Mood:  Anxious and Dysphoric  Affect:  Congruent  Thought Process:  Coherent and Descriptions of Associations: Intact  Orientation:  Full (Time, Place, and Person)  Thought Content:  Logical  Suicidal Thoughts:  No  Homicidal Thoughts:  No  Memory:  Immediate;   Fair Recent;   Fair Remote;   Fair  Judgement:  Intact  Insight:  Fair  Psychomotor Activity:  Normal  Concentration:  Concentration: Fair and Attention Span: Fair  Recall:  Good  Fund of Knowledge:  Good  Language:  Good  Akathisia:  Negative  Handed:  Right  AIMS (if indicated):     Assets:  Communication Skills Desire for Improvement Housing Resilience Talents/Skills  ADL's:  Intact  Cognition:  WNL  Sleep:  Number of Hours: 6.25     Treatment Plan Summary:Patient is a 29 year old female with a past psychiatric history significant for major depression and generalized anxiety disorder familiar to me from a previous psychiatric hospitalization here in July 2021 who presented to the behavioral health urgent care center on 09/15/2020 with suicidal ideation.  Today, Pt looks better. She is anxious, cooperative and oriented  x 4. She appears fairly groomed. Her speech is normal with normal rate. Her mood is anxious and dysphoric affect is congruent. She doesn't appear to be responding to any internal stimuli. No SI, HI and AVH. Nursing notes indicate that Pt slept for 6.25 hours. She has been attending groups and find it helpful. We reduced cymbalta to 30 mg daily and added Latuda 40 mg with supper. She is also taking Neurontin 300 mg TID for anxiety. Urine toxicology is negative. Alcohol level <10. EKG shows sinus tachycardia other wise normal EKG. Qtc- 348/459. Vitals- Blood pressure (!) 117/46, pulse (!) 108, temperature 97.8 F (36.6 C), temperature source Oral, resp. rate 18, height 6' (1.829 m), weight 126.1 kg, SpO2 98 %, unknown if currently breastfeeding. Plan-  Daily contact with patient to assess and evaluate symptoms and progress in treatment 1. Continue Duloxetine at 30 mg Daily 2. Continue Latuda 40 mg at Supper  3. Continue  Gabapentin 300 mg TID for Anxiety. 4. Folic Acid 1 mg daily 5. Thiamin Tablet 100 mg from tomorrow 6. Continue Zofran ODT 4 mg 6 hourly PRN for Nausea or Vomiting.  7. Continue  Ativan 1 mg Q6H PRN for withdrawal symptoms if CIWA >10 (Pt is not displaying any withdrawal symptoms) 8. Continue Imodium 2-4 mg oral as needed for diarrhea. 9. Start Remeron 7.5 mg QHS for Anxiety and Sleep. Other PRN medications 1. Trazodone 50 mg QHS PRN for sleep. 2. Hydroxyzine 25 mg TID PRN for anxiety  Karsten Ro, MD 09/18/2020, 8:58 AM

## 2020-09-18 NOTE — Progress Notes (Signed)
D:  Patient's self inventory sheet, patient has poor sleep, sleep medication helpful.  Fair appetite, low energy level, poor concentration.  Rated depression 8, hopeless 2, anxiety 4.  Denied withdrawals.  Denied SI.  Denied physical problems.  Denied physical pain.  Goal is continue medications.  Plans to meet with staff.  Does have discharge plans. A:  Medications administered per MD orders.  Emotional support and encouragement given patient. R:  Denied SI and HI, contracts for safety.  Denied A/V hallucinations.  Safety maintained with 15 minute checks.

## 2020-09-18 NOTE — [facility] Suicide Risk Assessment (Signed)
[facility] INPATIENT:  Family/Significant Other Suicide Prevention Education  Suicide Prevention Education:  Education Completed; Dimas Alexandria "Nellie" [phone] (Aunt), has been identified by the patient as the family member/significant other with whom the patient will be residing, and identified as the person(s) who will aid the patient in the event of a mental health crisis (suicidal ideations/suicide attempt).  With written consent from the patient, the family member/significant other has been provided the following suicide prevention education, prior to the and/or following the discharge of the patient.  The suicide prevention education provided includes the following:  Suicide risk factors  Suicide prevention and interventions  National Suicide Hotline telephone number  [facility] assessment telephone number  [facility] Emergency Assistance 911  [facility] and/or Residential Mobile Crisis Unit telephone number  Request made of family/significant other to:  Remove weapons (e.g., guns, rifles, knives), all items previously/currently identified as safety concern.    Remove drugs/medications (over-the-counter, prescriptions, illicit drugs), all items previously/currently identified as a safety concern.  The family member/significant other verbalizes understanding of the suicide prevention education information provided.  The family member/significant other agrees to remove the items of safety concern listed above.  Mrs. Excell Seltzer states that Ms. Ehrlich has anxiety and depression and that there is a long history of these issues that runs in the family.  Mrs. Excell Seltzer states that Ms. Kihara has not been properly medicated since the birth of her second child.  Mrs. Excell Seltzer states that Ms. Somlko experienced Postpartum depression with each of her pregnancies.  Mrs. Excell Seltzer states that when Ms. Fray is not medicated she consumes alcohol to self-medicate.  Mrs. Excell Seltzer states  that Ms. Norby's grandmother passed away in 05/10/2020 and that one of her aunts committed suicide earlier in the year.  Mrs. Excell Seltzer states that Ms. Spurr has self-harmed in the past but is not sure if she has done this recently.  Mrs. Excell Seltzer believes that if Ms. Augspurger receives medication management and outpatient therapy that she will do fine.  Mrs. Excell Seltzer states that there are no firearms in Ms. Muratalla's home and that there are only kitchen knifes and possibly a pocket knife.  Mrs. Excell Seltzer states that she will be contacting Ms. Parlee daily to check-in with her.   Metro Kung Dee Maday 09/18/2020, 10:11 AM

## 2020-09-18 NOTE — Progress Notes (Signed)
Adult Psychoeducational Group Note  Date:  09/18/2020 Time:  6:01 PM  Group Topic/Focus:  Building Self Esteem:   The Focus of this group is helping patients become aware of the effects of self-esteem on their lives, the things they and others do that enhance or undermine their self-esteem, seeing the relationship between their level of self-esteem and the choices they make and learning ways to enhance self-esteem.  Participation Level:  Active  Participation Quality:  Appropriate  Affect:  Appropriate  Cognitive:  Alert  Insight: Appropriate  Engagement in Group:  Engaged  Modes of Intervention:  Discussion  Additional Comments:  Pt attended group and participated in discussion.  Symphoni Helbling R Misha Antonini 09/18/2020, 6:01 PM

## 2020-09-18 NOTE — Plan of Care (Signed)
Nurse discussed anxiety, depression and coping skills with patient.  

## 2020-09-18 NOTE — [facility] Group Notes (Signed)
Occupational Therapy Group Note Date: 09/18/2020 Group Topic/Focus: Coping Skills  Group Description: Group encouraged increased engagement and participation through discussion and activity focused on "Building Happiness". Group discussed and identified six specific strategies to building happiness including identifying gratitude, acts of kindness, exercise, meditation, positive journal writing, and fostering relationships. Patients were then encouraged to practice and identify one strategy they found most useful that they could begin implementing into their daily routines.  Participation Level: Active   Participation Quality: Independent   Behavior: Calm, Cooperative and Interactive   Speech/Thought Process: Focused   Affect/Mood: Euthymic   Insight: Moderate   Judgement: Moderate   Individualization: Wanda Torres was active and independent in her participation of discussion and activity. She left group x 2 to meet with MD and providers, however returned and engaged appropriately. Pt identified "positive journaling" as something she would like to do more of and shared that she had just bought a journal before coming into the hospital however has not yet utilized it. Pt noted "It was kind of ironic because I was drunk and did not want to come in to the hospital when I bought it."   Modes of Intervention: Activity, Discussion, Education and Socialization  Patient Response to Interventions:  Attentive, Engaged, Receptive and Interested   Plan: Continue to engage patient in OT groups 2 - 3x/week.   09/18/2020  Donne Hazel, MOT, OTR/L

## 2020-09-18 NOTE — Progress Notes (Signed)
Patient rated her day as a 7 out of 10 since she feels that the medication is working. In addition, she stated that her social worker is trying to set her up with a partial hospitalization program.

## 2020-09-19 ENCOUNTER — Encounter (HOSPITAL_COMMUNITY): Payer: Self-pay | Admitting: Psychiatry

## 2020-09-19 MED ORDER — NICOTINE 21 MG/24HR TD PT24
21.0000 mg | MEDICATED_PATCH | Freq: Every day | TRANSDERMAL | 0 refills | Status: DC
Start: 2020-09-20 — End: 2020-09-26

## 2020-09-19 MED ORDER — GABAPENTIN 300 MG PO CAPS: 300.0000 mg | ORAL_CAPSULE | Freq: Three times a day (TID) | ORAL | 1 refills | Status: DC

## 2020-09-19 MED ORDER — DULOXETINE HCL 30 MG PO CPEP
30.0000 mg | ORAL_CAPSULE | Freq: Every day | ORAL | 0 refills | Status: DC
Start: 2020-09-20 — End: 2020-10-24

## 2020-09-19 MED ORDER — LURASIDONE HCL 40 MG PO TABS: 40.0000 mg | ORAL_TABLET | Freq: Every day | ORAL | 0 refills | Status: DC

## 2020-09-19 MED ORDER — MIRTAZAPINE 15 MG PO TABS
15.0000 mg | ORAL_TABLET | Freq: Every day | ORAL | 0 refills | Status: DC
Start: 2020-09-19 — End: 2020-10-24

## 2020-09-19 MED ORDER — TRAZODONE HCL 100 MG PO TABS
100.0000 mg | ORAL_TABLET | Freq: Every evening | ORAL | 0 refills | Status: DC | PRN
Start: 2020-09-19 — End: 2020-10-24

## 2020-09-19 MED ORDER — HYDROXYZINE HCL 25 MG PO TABS
25.0000 mg | ORAL_TABLET | Freq: Three times a day (TID) | ORAL | 0 refills | Status: DC | PRN
Start: 2020-09-19 — End: 2020-10-24

## 2020-09-19 NOTE — Progress Notes (Signed)
   09/19/20 7124  Psych Admission Type (Psych Patients Only)  Admission Status Voluntary  Psychosocial Assessment  Patient Complaints None  Eye Contact Fair  Facial Expression Other (Comment) (appropriate)  Affect Appropriate to circumstance  Speech Logical/coherent  Interaction Assertive  Motor Activity Other (Comment) (wnl)  Appearance/Hygiene Unremarkable  Behavior Characteristics Cooperative;Appropriate to situation  Mood Pleasant  Thought Process  Coherency WDL  Content WDL  Delusions None reported or observed  Perception WDL  Hallucination None reported or observed  Judgment WDL  Confusion None  Danger to Self  Current suicidal ideation? Denies  Danger to Others  Danger to Others None reported or observed   Pt states no side effects noted from [place]. Pt was tired and slept a lot yesterday after taking it. Nothing else mentioned.

## 2020-09-19 NOTE — Progress Notes (Signed)
  [facility] Adult Case Management Discharge Plan :  Will you be returning to the same living situation after discharge:  Yes,  to own home At discharge, do you have transportation home?: Yes,  Self  Do you have the ability to pay for your medications: Yes,  Medicaid  Release of information consent forms completed and in the chart;  Patient's signature needed at discharge.  Patient to Follow up at:  Follow-up Information    [facility]. Go to.   Specialty: Urgent Care Why: Medication management appointment with Dr. Evelene Croon scheduled for 10/23/20 at 11:20am. A walk-in therapy appointment is scheduled for 09/29/20 at 3:30pm.  Contact information: [address] [place] [address] [phone]              Next level of care provider has access to [facility] Link:yes  Safety Planning and Suicide Prevention discussed: Yes,  with Aunt     Has patient been referred to the Quitline?: Patient refused referral  Patient has been referred for addiction treatment: N/A  Aram Beecham, LCSWA 09/19/2020, 10:48 AM

## 2020-09-19 NOTE — [facility] Suicide Risk Assessment (Signed)
[facility] Discharge Suicide Risk Assessment   Principal Problem: MDD (major depressive disorder), recurrent episode, severe (HCC) Discharge Diagnoses: Principal Problem:   MDD (major depressive disorder), recurrent episode, severe (HCC) Active Problems:   Attention deficit hyperactivity disorder (ADHD), predominantly inattentive type   Anxiety   Total Time spent with patient: 35 min  Musculoskeletal: Strength & Muscle Tone: within normal limits Gait & Station: normal Patient leans: N/A  Psychiatric Specialty Exam: Review of Systems  Constitutional: Negative.   HENT: Negative.   Respiratory: Negative.   Cardiovascular: Negative.   Gastrointestinal: Negative.   Musculoskeletal: Positive for arthralgias and myalgias.  Neurological: Negative.   Psychiatric/Behavioral: Positive for decreased concentration. Negative for agitation, behavioral problems, confusion, dysphoric mood, hallucinations, self-injury, sleep disturbance and suicidal ideas. The patient is not nervous/anxious and is not hyperactive.     Blood pressure 106/79, pulse 97, temperature 97.9 F (36.6 C), temperature source Oral, resp. rate 18, height 6' (1.829 m), weight 126.1 kg, SpO2 100 %, unknown if currently breastfeeding.Body mass index is 37.7 kg/m.  General Appearance: Neat and Well Groomed  Eye Contact::  Good  Speech:  Clear and Coherent and Normal Rate409  Volume:  Normal  Mood:  Euthymic  Affect:  Appropriate and Congruent  Thought Process:  Coherent, Goal Directed, Linear and Descriptions of Associations: Intact  Orientation:  Full (Time, Place, and Person)  Thought Content:  Logical and Hallucinations: None  Suicidal Thoughts:  No  Homicidal Thoughts:  No  Memory:  Immediate;   Good Recent;   Good Remote;   Good  Judgement:  Fair  Insight:  Fair  Psychomotor Activity:  Normal  Concentration:  Good  Recall:  Good  Fund of Knowledge:Good  Language: Good  Akathisia:  No  Handed:  Right  AIMS (if  indicated):     Assets:  Communication Skills Desire for Improvement Financial Resources/Insurance Housing Leisure Time Physical Health Resilience Social Support Talents/Skills Transportation Vocational/Educational  Sleep:  Number of Hours: 5.25  Cognition: WNL  ADL's:  Intact   Mental Status Per Nursing Assessment::   On Admission:  Self-harm thoughts  Demographic Factors:  Caucasian, Living alone, Unemployed and seeking employment  Loss Factors: daughters ages 37,1 living with her ex; 87 year old daughter with patient's sister  Historical Factors: Family history of mental illness or substance abuse  Risk Reduction Factors:   Sense of responsibility to family, Positive social support, Positive therapeutic relationship, Positive coping skills or problem solving skills and 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  Continued Clinical Symptoms:  Anxiety Depression Postpartum Depression history  Cognitive Features That Contribute To Risk:  None    Suicide Risk:  Minimal: No identifiable suicidal ideation.  Patients presenting with no risk factors but with morbid ruminations; may be classified as minimal risk based on the severity of the depressive symptoms   Follow-up Information    [facility]. Go to.   Specialty: Urgent Care Why: Medication management appointment with Dr. Evelene Croon scheduled for 10/23/20 at 11:20am. A walk-in therapy appointment is scheduled for 09/29/20 at 3:30pm.  Contact information: [address] [place] [address] [phone]              Plan Of Care/Follow-up recommendations:  Activity:  ad lib Diet:  as tolerated Other:  Discussed evaluation for underlying primary sleep disorder. History of RLS. Recommended labs:  Iron profile with goal of ferritin >50 ng/ml. Start an OTC multiple vitamin with iron. Consider sleep study if snoring  prevalent.  On day of discharge following sustained  improvement in the affect of this patient, continued report of euthymic mood, repeated denial of suicidal, homicidal, and other violent ideation, adequate interaction with peers, active participation in groups while on the unit, and denial of adverse reactions from medications, the treatment team decided Wanda Torres was stable for discharge home with scheduled mental health treatment.  She was able to engage in safety planning including plan to return to nearest emergency room or contact emergency services if she feels unable to maintain her own safety or the safety of others. Patient had no further questions, comments, or concerns.  Discharge into care of self, who agrees to maintain patient safety.  Wanda Torres plans to have dinner with her aunt and uncle tonight, whom she states are supportive.  Patient aware to return to nearest crisis center, ED or to call 911 for worsening symptoms of depression, suicidal or homicidal thoughts or AVH.     Mariel Craft, MD 09/19/2020, 12:49 PM

## 2020-09-19 NOTE — Progress Notes (Signed)
RN met with pt and reviewed pt's discharge instructions.  Pt verbalized understanding of discharge instructions and pt did not have any questions. RN reviewed and provided pt with a copy of SRA, AVS and Transition Record.  RN returned pt's belongings to pt.  Prescriptions were given to pt.  Pt denied SI/HI/AVH and voiced no concerns.  Pt was appreciative of the care pt received at [facility]. Patient discharged to the lobby without incident. 

## 2020-09-19 NOTE — Discharge Summary (Signed)
Physician Discharge Summary Note  Patient:  Wanda Torres is an 29 y.o., female MRN:  [id number] DOB:  October 12, 1991 Patient phone:  [phone] (home)  Patient address:   [address] [place] [address],  Total Time spent with patient: 35 minutes  Date of Admission:  09/16/2020 Date of Discharge:   Reason for Admission:  Wanda Torres is a 29 yo female with a past medical history significant for MDD, ADHD, and anxiety who was admitted to the [facility] for worsening depression with suicidal ideations, with a plan.   Principal Problem: MDD (major depressive disorder), recurrent episode, severe (HCC) Discharge Diagnoses: Principal Problem:   MDD (major depressive disorder), recurrent episode, severe (HCC) Active Problems:   Attention deficit hyperactivity disorder (ADHD), predominantly inattentive type   Anxiety   Past Psychiatric History: MDD, ADHD, anxiety.  Past Medical History:  Past Medical History:  Diagnosis Date  . Acne   . Allergy   . Anemia   . Anxiety   . Depression   . Psoriasis   . Supervision of other high risk pregnancy, antepartum 01/19/2019   Formatting of this note might be different from the original. Partner prefers She/Her pronouns   History reviewed. No pertinent surgical history. Family History: History reviewed. No pertinent family history. Family Psychiatric  History: Sister has bipolar. Multiple family members with depression. Aunt committed suicide. Social History:  Social History   Substance and Sexual Activity  Alcohol Use Yes  . Alcohol/week: 0.0 standard drinks     Social History   Substance and Sexual Activity  Drug Use Never    Social History   Socioeconomic History  . Marital status: Divorced    Spouse name: Not on file  . Number of children: Not on file  . Years of education: Not on file  . Highest education level: Not on file  Occupational History    Comment: Supervisor reservations; 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[employer]  Tobacco Use  . Smoking status: Never Smoker  . Smokeless tobacco: Never Used  Vaping Use  . Vaping Use: Every day  . Substances: Nicotine  Substance and Sexual Activity  . Alcohol use: Yes    Alcohol/week: 0.0 standard drinks  . Drug use: Never  . Sexual activity: Not on file  Other Topics Concern  . Not on file  Social History Narrative  . Not on file   Social Determinants of Health   Financial Resource Strain:   . Difficulty of Paying Living Expenses: Not on file  Food Insecurity:   . Worried About Programme researcher, broadcasting/film/video in the Last Year: Not on file  . Ran Out of Food in the Last Year: Not on file  Transportation Needs:   . Lack of Transportation (Medical): Not on file  . Lack of Transportation (Non-Medical): Not on file  Physical Activity:   . Days of Exercise per Week: Not on file  . Minutes of Exercise per Session: Not on file  Stress:   . Feeling of Stress : Not on file  Social Connections:   . Frequency of Communication with Friends and Family: Not on file  . Frequency of Social Gatherings with Friends and Family: Not on file  . Attends Religious Services: Not on file  . Active Member of Clubs or Organizations: Not on file  . Attends Banker Meetings: Not on file  . Marital Status: Not on file    Hospital Course:  Admission Notes (H&P)- Wanda Torres is  a 29 yo female with a past medical history significant for MDD, ADHD, and anxiety who was admitted to the [facility] for worsening depression with suicidal ideations, with a plan. Wanda Torres reported to the [facility] yesterday expressing SI and had a plan to take Tylenol, cut her wrist, and get into the shower and bleed to death. She stated that she has been feeling this way for the past week and a half. This morning, she is not experiencing any SI, HI, or AVH. She reports that her depression waxes and wanes throughout the day. She describes her mood as "depressed" and "numb." She endorses lack of  interest, decreased energy. She says that her appetite is normal and that prior to this admission her sleep has been fine. However, she did mention that over the past week and a half she has drunk alcohol 3 nights to help her go to sleep. She states her anxiety is well managed. She states that she does not use any substances.  She reports the main stressor in her life if the custody of her 3 children. She reports that following her last admission to [facility] (07/09/20) for worsening depression with SI and self-harm, her children have been in the custody of others. Her 2 youngest children live with her ex, who she only communicates with via text, and her oldest lives with her sister, who refuses to communicate with her.  She has been treated for MDD since she was 12. Prior to her previous admission, she was on Zoloft for depression. During the admission, her medications were changed and patient was discharged home with duloxetine and gabapentin. Patient reports that when she was discharged from previous admission that her depression and anxiety were managed well,and that she did not have any SI/HI or thoughts of self-harm. At the end of July, she felt like the medications were not working so she went to [facility]. She was there for three days, they did not change any of her medications, she felt like they were not helping her and she began to feel worse, so she left voluntarily. She is being seen at [facility] by Dr. Evelene Croon. Per Dr. Carie Caddy note, at her last visit on 08/26/2020 her doses of duloxetine and gabapentin were titrated up, and patient was started back on vyvanse for ADHD. Patient reports that her depression was well managed until approximately a week and half ago. Patient has ongoing custody stressor as detailed above, but does not think that this triggered her worsening depression. She can not think of anything that happened to trigger her acute worsening depression and SI. Of note, has been treatment with  prozac, wellbutrin (she felt like it made her crazy, irritable, and angry), with little to no improvement in her depression. Zoloft managed depression well until last [facility] admission. During inpatient stay, Pt was put on 15 minutes suicidal checks. At admission Duloxetine was changed to 30 mg once daily, Latuda 40 mg at Supper was added, Gabapentin 300 mg TID was continued. Pt was also put on Folic Acid 1 mg daily, Thiamine IM injection 100 mg once,Thiamin Tablet 100 mg, Potassium Chloride 20 mEq once for low K (3.4), Zofran ODT 4 mg 6 hourly PRN for Nausea or Vomiting, Ativan 1 mg Q6H PRN for withdrawal symptoms if CIWA >10, Imodium 2-4 mg oral as needed for diarrhea, Trazodone 50 mg QHS PRN for sleep and Hydroxyzine 25 mg TID PRN for anxiety. Pt reported poor sleep so Remeron 15 mg was added. Pt's mood, anxiety and sleep  improved during her time at the inpatient unit. Pt tolerated medications well. During her stay Patient did not display any dangerous, violent or suicidal behavior on the unit.  Pt interacted with patients & staff appropriately, participated appropriately in the group sessions/therapies. Pt's medications were addressed & adjusted to meet her needs. Pt was recommended for outpatient follow-up care & medication management upon discharge to assure her continuity of care.   At the time of discharge, patient is not reporting any acute suicidal/homicidal ideations. Pt reported improved sleep and mood.Pt currently denies any new issues or concerns. Education and supportive counseling provided throughout her hospital stay & upon discharge. Pt is discharged to her home.   Physical Findings: AIMS:  , ,  ,  ,    CIWA:  CIWA-Ar Total: 0 COWS:     Musculoskeletal: Strength & Muscle Tone: within normal limits Gait & Station: normal Patient leans: N/A  Psychiatric Specialty Exam: Physical Exam Vitals and nursing note reviewed.  Constitutional:      General: She is not in acute distress.     Appearance: Normal appearance. She is not ill-appearing, toxic-appearing or diaphoretic.  HENT:     Head: Normocephalic and atraumatic.  Pulmonary:     Effort: Pulmonary effort is normal.  Neurological:     General: No focal deficit present.     Mental Status: She is alert and oriented to person, place, and time.     Review of Systems  HENT: Negative.   Respiratory: Negative.   Cardiovascular: Negative.   Genitourinary: Negative.   Neurological: Negative.   Psychiatric/Behavioral: Negative for agitation, dysphoric mood, hallucinations, sleep disturbance and suicidal ideas. The patient is not nervous/anxious.     Blood pressure 106/79, pulse 97, temperature 97.9 F (36.6 C), temperature source Oral, resp. rate 18, height 6' (1.829 m), weight 126.1 kg, SpO2 100 %, unknown if currently breastfeeding.Body mass index is 37.7 kg/m.  General Appearance: Fairly Groomed  Eye Contact:  Good  Speech:  Clear and Coherent  Volume:  Normal  Mood:  Euthymic  Affect:  Appropriate  Thought Process:  Coherent  Orientation:  Full (Time, Place, and Person)  Thought Content:  Logical and Hallucinations: None  Suicidal Thoughts:  No  Homicidal Thoughts:  No  Memory:  Immediate;   Good Recent;   Good Remote;   Good  Judgement:  Fair  Insight:  Fair  Psychomotor Activity:  Normal  Concentration:  Concentration: Good and Attention Span: Good  Recall:  Good  Fund of Knowledge:  Good  Language:  Good  Akathisia:  No  Handed:  Right  AIMS (if indicated):     Assets:  Communication Skills Desire for Improvement Financial Resources/Insurance Housing Leisure Time Physical Health Resilience Social Support Talents/Skills Transportation Vocational/Educational  ADL's:  Intact  Cognition:  WNL  Sleep:  Number of Hours: 5.25        Has this patient used any form of tobacco in the last 30 days? (Cigarettes, Smokeless Tobacco, Cigars, and/or Pipes) , No  Blood Alcohol level:  Lab Results   Component Value Date   ETH <10 09/17/2020   ETH <10 07/09/2020    Metabolic Disorder Labs:  Lab Results  Component Value Date   HGBA1C 4.4 (L) 07/09/2020   MPG 79.58 07/09/2020   No results found for: PROLACTIN Lab Results  Component Value Date   CHOL 131 07/09/2020   TRIG 143 07/09/2020   HDL 59 07/09/2020   CHOLHDL 2.2 07/09/2020   VLDL 29 07/09/2020  LDLCALC 43 07/09/2020    See Psychiatric Specialty Exam and Suicide Risk Assessment completed by Attending Physician prior to discharge.  Discharge destination:  Home  Is patient on multiple antipsychotic therapies at discharge:  No   Has Patient had three or more failed trials of antipsychotic monotherapy by history:  No  Recommended Plan for Multiple Antipsychotic Therapies: NA   Allergies as of 09/19/2020   No Known Allergies     Medication List    TAKE these medications     Indication  DULoxetine 30 MG capsule Commonly known as: CYMBALTA Take 1 capsule (30 mg total) by mouth daily. For depression Start taking on: September 20, 2020  Indication: Major Depressive Disorder   gabapentin 300 MG capsule Commonly known as: NEURONTIN Take 1 capsule (300 mg total) by mouth 3 (three) times daily. For agitation/pain  Indication: Agitation, Pain   hydrOXYzine 25 MG tablet Commonly known as: ATARAX/VISTARIL Take 1 tablet (25 mg total) by mouth 3 (three) times daily as needed for anxiety.  Indication: Feeling Anxious   lurasidone 40 MG Tabs tablet Commonly known as: LATUDA Take 1 tablet (40 mg total) by mouth daily with supper. For mood control  Indication: Mood control   mirtazapine 15 MG tablet Commonly known as: REMERON Take 1 tablet (15 mg total) by mouth at bedtime. For depression/sleep  Indication: Major Depressive Disorder, Insomnia   nicotine 21 mg/24hr patch Commonly known as: NICODERM CQ - dosed in mg/24 hours Place 1 patch (21 mg total) onto the skin daily. (May buy from over the counter): For  smoking cessation Start taking on: September 20, 2020  Indication: Nicotine Addiction   traZODone 100 MG tablet Commonly known as: DESYREL Take 1 tablet (100 mg total) by mouth at bedtime as needed for sleep.  Indication: Trouble Sleeping       Follow-up Information    [facility]. Go to.   Specialty: Urgent Care Why: Medication management appointment with Dr. Evelene CroonKaur scheduled for 10/23/20 at 11:20am. A walk-in therapy appointment is scheduled for 09/29/20 at 3:30pm.  Contact information: [address] [place] [id number] [phone]              Follow-up recommendations:  Activity:  As tolerated. Diet:  : As recommended by your primary care doctor.  Comments:  Prescriptions given at discharge. Patient agreeable to plan.  Given opportunity to ask questions.  Appears to feel comfortable with discharge denies any current suicidal or homicidal thought. Patient is also instructed prior to discharge to: Take all medications as prescribed by her mental healthcare provider. Report any adverse effects and or reactions from the medicines to her outpatient provider promptly. Patient has been instructed & cautioned: To not engage in alcohol and or illegal drug use while on prescription medicines. In the event of worsening symptoms, patient is instructed to call the crisis hotline, 911 and or go to the nearest ED for appropriate evaluation and treatment of symptoms. To follow-up with her primary care provider for your other medical issues, concerns and or health care needs.  Signed: Karsten RoVandana  Tycen Dockter, MD 09/19/2020, 5:17 PM

## 2020-09-19 NOTE — Progress Notes (Signed)
Recreation Therapy Notes  Date:  9.24.21 Time: 0930 Location: [address]  Group Topic: Stress Management  Goal Area(s) Addresses:  Patient will identify positive stress management techniques. Patient will identify benefits of using stress management post d/c.  Behavioral Response: Entgaged  Intervention: Stress Management  Activity: Guided Imagery.  LRT read a script that focused on going anywhere you envision by floating on a cloud.  Patients were to listen and follow along as script was read to engage in activity.    Education:  Stress Management, Discharge Planning.   Education Outcome: Acknowledges Education  Clinical Observations/Feedback:  Pt attended and participated in group.    Ardythe Klute, LRT/CTRS         Shawntavia Saunders A 09/19/2020 10:17 AM 

## 2020-09-22 ENCOUNTER — Telehealth (HOSPITAL_COMMUNITY): Payer: Self-pay | Admitting: *Deleted

## 2020-09-22 ENCOUNTER — Telehealth (HOSPITAL_COMMUNITY): Payer: Self-pay | Admitting: Professional

## 2020-09-22 MED ORDER — LISDEXAMFETAMINE DIMESYLATE 50 MG PO CAPS
50.0000 mg | ORAL_CAPSULE | Freq: Every day | ORAL | 0 refills | Status: DC
Start: 2020-09-22 — End: 2020-09-22

## 2020-09-22 MED ORDER — LISDEXAMFETAMINE DIMESYLATE 50 MG PO CAPS: 50.0000 mg | ORAL_CAPSULE | Freq: Every day | ORAL | 0 refills | Status: DC

## 2020-09-22 MED ORDER — LISDEXAMFETAMINE DIMESYLATE 50 MG PO CAPS
50.0000 mg | ORAL_CAPSULE | Freq: Every day | ORAL | 0 refills | Status: DC
Start: 2020-09-22 — End: 2020-10-15

## 2020-09-22 NOTE — Telephone Encounter (Signed)
s request to Dr Evelene Croon for her consideration.VM left for writer re refill for this patients Vyvanse. She was just discharged from [facility] and Vyvanse was not ordered. She was last seen by Dr Evelene Croon late Aug. She has an appt for 10/4 with Dr Evelene Croon. She has RX available at [facility] but it is too soon to refill it, would need to call pharmacy to lift no early fill on it. Will refer thi

## 2020-09-22 NOTE — Telephone Encounter (Signed)
30 day prescription sent to pharmacy.  

## 2020-09-22 NOTE — Addendum Note (Signed)
Addended by: Zena Amos on: 09/22/2020 08:37 AM   Modules accepted: Orders

## 2020-09-24 ENCOUNTER — Ambulatory Visit (HOSPITAL_COMMUNITY): Payer: Medicaid Other

## 2020-09-24 ENCOUNTER — Other Ambulatory Visit: Payer: Self-pay

## 2020-09-24 ENCOUNTER — Telehealth (HOSPITAL_COMMUNITY): Payer: Self-pay | Admitting: Professional

## 2020-09-25 ENCOUNTER — Other Ambulatory Visit: Payer: Self-pay

## 2020-09-25 ENCOUNTER — Ambulatory Visit (INDEPENDENT_AMBULATORY_CARE_PROVIDER_SITE_OTHER): Payer: Medicaid Other | Admitting: Professional

## 2020-09-25 DIAGNOSIS — F332 Major depressive disorder, recurrent severe without psychotic features: Secondary | ICD-10-CM | POA: Diagnosis not present

## 2020-09-25 NOTE — Psych (Addendum)
Virtual Visit via Video Note  I connected with Wanda Torres on 09/25/20 at  9:00 AM EDT by a video enabled telemedicine application and verified that I am speaking with the correct person using two identifiers.   I discussed the limitations of evaluation and management by telemedicine and the availability of in person appointments. The patient expressed understanding and agreed to proceed.  Location: Provider: Clinical Home office Patient: Home   Follow Up Instructions:    I discussed the assessment and treatment plan with the patient. The patient was provided an opportunity to ask questions and all were answered. The patient agreed with the plan and demonstrated an understanding of the instructions.   The patient was advised to call back or seek an in-person evaluation if the symptoms worsen or if the condition fails to improve as anticipated.  I provided 240 minutes of non-face-to-face time during this encounter.   Wanda Torres, [facility]    [facility] PHP THERAPIST PROGRESS NOTE  Wanda Torres [id number]  Session Time: 9:00-10:00  Participation Level: Active  Behavioral Response: CasualAlertAnxious and Depressed  Type of Therapy: Group Therapy  Treatment Goals addressed: Coping  Interventions: CBT, DBT, Solution Focused, Strength-based, Supportive and Reframing  Summary: Clinician led check-in regarding current stressors and situation, and review of patient completed daily inventory. Clinician utilized active listening and empathetic response and validated patient emotions. Clinician facilitated processing group on pertinent issues.   Therapist Response:  Wanda Torres is a 29 y.o. female who presents with depression and anxiety symptoms. Patient arrived within time allowed and reports that she is feeling "anxious." Patient rates her mood at a 5 on a scale of 1-10 with 10 being great. Pt reports she is anxious about the day due to being new in PHP. Pt is able  to process and get support from group. Pt reports she was only able to sleep a little and woke up at 3a. Patient engaged in discussion.      Session Time: 10:00- 11:00   Participation Level: Active   Behavioral Response: CasualAlertDepressed   Type of Therapy: Group Therapy   Treatment Goals addressed: Coping   Interventions: CBT, DBT, Supportive, Reframing, and Strengths Based   Summary: Clinician led the group in a "Self-Care" discussion. The group discussed the important of relationship with self and giving to self. The group participated in completing a self-care assessment- physical self-care. Pt's discussed needs for improvement and areas where pt feels no improvement needed.    Therapist Response: Patient engaged in group. Patient reports needing to work on eating regularly and exercise.     Session Time: 11:00 -12:00   Participation Level: Active   Behavioral Response: CasualAlertDepressed   Type of Therapy: Group Therapy, psychotherapy   Treatment Goals addressed: Coping   Interventions: CBT, DBT, Supportive, Reframing, and Strengths Based   Summary:  Clinician led the group in a self-care assessment- emotional and social self-care. Pt's discussed needs for improvement and areas where pt feels no improvement needed.    Therapist Response: Patient engaged in group. Patient reports needing to work on attending preventative appointments, resting when sick, expressing feelings, and do comforting things.        Session Time: 12:00 -1:00   Participation Level: Active   Behavioral Response: CasualAlertDepressed   Type of Therapy: Group therapy   Treatment Goals addressed: Coping   Interventions: CBT; Solution focused; Supportive; Reframing   Summary: 12:00 - 12:50: Clinician continued discussion on self-care assessment- spiritual and professional  selfcare. Pt's discussed needs for improvement and areas where pt feels no improvement needed.  12:50 -1:00 Clinician  led check-out. Clinician assessed for immediate needs, medication compliance and efficacy, and safety concerns   Therapist Response: 12:00 - 12:50: Pt engaged in activity and reports need for improvement in asking for help, saying no, meeting new people, and taking breaks. 12:50 - 1:00: At check-out, patient rates her mood at a 4 on a scale of 1-10 with 10 being great. Pt reports she was triggered by discussing spiritual care due to recent death of grandmother. Pt able to process. Pt reports afternoon plans of distracting with crafts and attending dinner with uncle and aunt. Patient demonstrates some progress as evidenced by participation on first day of group. Patient denies SI/HI/self-harm at the end of group.  Suicidal/Homicidal: Nowithout intent/plan    Plan: Pt will continue in PHP while working to decrease depression and anxiety symptoms, increase ability to manage symptoms in a healthy manner.  Diagnosis: Severe episode of recurrent major depressive disorder, without psychotic features (HCC) [F33.2]    1. Severe episode of recurrent major depressive disorder, without psychotic features [facility])       Wanda Torres, [facility] 09/25/2020

## 2020-09-26 ENCOUNTER — Ambulatory Visit (HOSPITAL_COMMUNITY): Payer: Medicaid Other

## 2020-09-26 ENCOUNTER — Encounter (HOSPITAL_COMMUNITY): Payer: Self-pay

## 2020-09-26 ENCOUNTER — Other Ambulatory Visit: Payer: Self-pay

## 2020-09-26 NOTE — Progress Notes (Unsigned)
Met with patient through virtual visit with Patrecia Pace, RN as patient presented with appropriate affect, pleasant mood and denied any current suicidal or homicidal ideations, no plan or intent to want to harm self or others and stated liking the first 2 days of PHP.  Patient discussed 2 recent voluntary hospital admissions for positive suicidal ideations and plans to want to harm self. Patient denied any auditory or visual hallucinations and rated her current level of depression a 1 and anxiety a 1 on a scale of 1-10 with 1 being none and 10 the worst she could manage.  Patient scored a 15 on her PHQ9 depression screening as she reported the first week was prior to going into the hospital and while there and this week "has been great".  Patient reported feeling current  Medication regimen was really helping as she if finally sleeping approximately 10 hours a night now and use to suffer from chronic insomnia.  Patient reported feeling a little "wonkie" in the mornings when she first gets up and stated she informed Ricky Ala, NP she would continue to monitor this as she is taking both Trazodone and Remeron to help with sleep.  Patient reported some chronic pain from past ACL repair and just feels "sore" and "achy" at times.  Patient stated she has only taking Vistaril 2 times since discharge from inpatient as anxiety has improved and depression is improving as well.  Patient agreed to inform PHP staff if any worsening of symptoms or problems with medications.  Patient encouraged to continue with PHP as she stated feeling it is helpful at this time.

## 2020-09-27 NOTE — Progress Notes (Unsigned)
Virtual Visit via Video Note  I connected with Wanda Torres on 09/26/20 at  9:00 AM EDT by a video enabled telemedicine application and verified that I am speaking with the correct person using two identifiers.   I discussed the limitations of evaluation and management by telemedicine and the availability of in person appointments. The patient expressed understanding and agreed to proceed.  I discussed the assessment and treatment plan with the patient. The patient was provided an opportunity to ask questions and all were answered. The patient agreed with the plan and demonstrated an understanding of the instructions.   The patient was advised to call back or seek an in-person evaluation if the symptoms worsen or if the condition fails to improve as anticipated.  I provided  15 minutes of non-face-to-face time during this encounter.   Wanda Rackanika N Michio Thier, NP   [facility]   Patient Identification: Wanda Torres MRN:  [id number] Date of Evaluation:  09/27/2020    Visit Diagnosis: No diagnosis found.  History of Present Illness: [facility] a recent inpatient admission for suicidal thoughts and self-harm.  Patient reports worsening panic attacks related to daily stressors.  Patient reports struggling with a strained relationship between she and her ex.  States custody dispute and she hasn't gotten anytime to spend with her children age 569,753 and 30 year old.  She reports previous inpatient admissions.  States she is currently followed by therapist and a psychiatrist which she is prescribed Cymbalta, gabapentin, Latuda,  Remeron and Vyvanse.  Reported taking medications as directed denied any medication side effects at this time.  Reported and needed this inpatient admission to clear my mind.  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  Reports  poor concentration, low energy and depressed mood often.  Patient to be admitted to partial programming on 09/26/2020  Per Admission Notes (H&P)- Wanda Torres is a 29 yo female with a past medical history significant for MDD, ADHD, and anxiety who [facility] with suicidal ideations, with a plan. [facility] expressing SI and had a plan to take Tylenol, cut her wrist, and get into the shower and bleed to death. She stated that she has been feeling this way for the past week and a half.  Associated Signs/Symptoms: Depression Symptoms:  depressed mood, feelings of worthlessness/guilt, difficulty concentrating, (Hypo) Manic Symptoms:  Irritable Mood, Anxiety Symptoms:  Excessive Worry, Psychotic Symptoms:  Hallucinations: None PTSD Symptoms: Avoidance:  Decreased Interest/Participation  Past Psychiatric History:   Previous Psychotropic Medications: Yes   Substance Abuse History in the last 12 months:  No.  Consequences of Substance Abuse: NA  Past Medical History:  Past Medical History:  Diagnosis Date  . Acne   . Allergy   . Anemia   . Anxiety   . Depression   . Psoriasis   . Supervision of other high risk pregnancy, antepartum 01/19/2019   Formatting of this note might be different from the original. Partner prefers She/Her pronouns   History reviewed. No pertinent surgical history.  Family Psychiatric History:   Family History:  Family History  Problem Relation Age of Onset  . Anxiety disorder Mother   . Depression Mother   . Sexual abuse Mother   . Drug abuse Father   . ADD / ADHD Sister   . Anxiety disorder Sister   .  Bipolar disorder Sister   . Depression Sister   . Drug abuse Sister   . Sexual abuse Sister   . Anxiety disorder Brother   . Depression Brother   . Sexual abuse Brother   . Alcohol abuse Maternal Aunt   . Bipolar disorder Maternal Aunt   . Depression Maternal Aunt   . Sexual  abuse Maternal Aunt   . ADD / ADHD Maternal Uncle   . Alcohol abuse Maternal Uncle   . Drug abuse Maternal Uncle   . Sexual abuse Maternal Uncle   . Alcohol abuse Maternal Grandfather   . Dementia Maternal Grandfather   . Depression Maternal Grandfather   . Sexual abuse Maternal Grandfather   . Depression Maternal Grandmother   . Sexual abuse Maternal Grandmother   . Bipolar disorder Maternal Aunt   . Depression Maternal Aunt   . Sexual abuse Maternal Aunt     Social History:   Social History   Socioeconomic History  . Marital status: Divorced    Spouse name: Not on file  . Number of children: Not on file  . Years of education: Not on file  . Highest education level: Not on file  Occupational History    Comment: Supervisor reservations; 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[employer]  Tobacco Use  . Smoking status: Never Smoker  . Smokeless tobacco: Never Used  . Tobacco comment: Vaps daily   Vaping Use  . Vaping Use: Every day  . Substances: Nicotine  Substance and Sexual Activity  . Alcohol use: Yes    Alcohol/week: 1.0 standard drink    Types: 1 Standard drinks or equivalent per week    Comment: reports one a week  . Drug use: Never  . Sexual activity: Not on file  Other Topics Concern  . Not on file  Social History Narrative  . Not on file   Social Determinants of Health   Financial Resource Strain:   . Difficulty of Paying Living Expenses: Not on file  Food Insecurity:   . Worried About Programme researcher, broadcasting/film/video in the Last Year: Not on file  . Ran Out of Food in the Last Year: Not on file  Transportation Needs:   . Lack of Transportation (Medical): Not on file  . Lack of Transportation (Non-Medical): Not on file  Physical Activity:   . Days of Exercise per Week: Not on file  . Minutes of Exercise per Session: Not on file  Stress:   . Feeling of Stress : Not on file  Social Connections:   . Frequency of Communication with Friends and Family: Not on file  . Frequency of  Social Gatherings with Friends and Family: Not on file  . Attends Religious Services: Not on file  . Active Member of Clubs or Organizations: Not on file  . Attends Banker Meetings: Not on file  . Marital Status: Not on file    Additional Social History:   Allergies:  No Known Allergies  Metabolic Disorder Labs: Lab Results  Component Value Date   HGBA1C 4.4 (L) 07/09/2020   MPG 79.58 07/09/2020   No results found for: PROLACTIN Lab Results  Component Value Date   CHOL 131 07/09/2020   TRIG 143 07/09/2020   HDL 59 07/09/2020   CHOLHDL 2.2 07/09/2020   VLDL 29 07/09/2020   LDLCALC 43 07/09/2020   Lab Results  Component Value Date   TSH 2.264 07/09/2020    Therapeutic Level Labs: No results found for: LITHIUM No results found  for: CBMZ No results found for: VALPROATE  Current Medications: Current Outpatient Medications  Medication Sig Dispense Refill  . DULoxetine (CYMBALTA) 30 MG capsule Take 1 capsule (30 mg total) by mouth daily. For depression 30 capsule 0  . gabapentin (NEURONTIN) 300 MG capsule Take 1 capsule (300 mg total) by mouth 3 (three) times daily. For agitation/pain 45 capsule 1  . hydrOXYzine (ATARAX/VISTARIL) 25 MG tablet Take 1 tablet (25 mg total) by mouth 3 (three) times daily as needed for anxiety. 75 tablet 0  . lisdexamfetamine (VYVANSE) 50 MG capsule Take 1 capsule (50 mg total) by mouth daily. 30 capsule 0  . lurasidone (LATUDA) 40 MG TABS tablet Take 1 tablet (40 mg total) by mouth daily with supper. For mood control 30 tablet 0  . mirtazapine (REMERON) 15 MG tablet Take 1 tablet (15 mg total) by mouth at bedtime. For depression/sleep 30 tablet 0  . traZODone (DESYREL) 100 MG tablet Take 1 tablet (100 mg total) by mouth at bedtime as needed for sleep. 30 tablet 0   No current facility-administered medications for this visit.    Musculoskeletal:  Psychiatric Specialty Exam: Review of Systems  Height 6' (1.829 m), weight 276 lb  (125.2 kg), unknown if currently breastfeeding.Body mass index is 37.43 kg/m.  General Appearance: Casual  Eye Contact:  Good  Speech:  Clear and Coherent  Volume:  Normal  Mood:  Anxious and Depressed  Affect:  Congruent and Depressed  Thought Process:  Coherent  Orientation:  Full (Time, Place, and Person)  Thought Content:  Logical  Suicidal Thoughts:  No  Homicidal Thoughts:  No  Memory:  Immediate;   Fair Recent;   Fair  Judgement:  Fair  Insight:  Fair  Psychomotor Activity:  Normal  Concentration:  Concentration: Fair  Recall:  [employer] of Knowledge:Fair  Language: Fair  Akathisia:  No  Handed:  Right  AIMS (if indicated):  done  Assets:  Communication Skills Desire for Improvement Resilience Social Support  ADL's:  Intact  Cognition: WNL  Sleep:  Fair   Screenings: AIMS     [facility]  AIMS Total Score 0    AUDIT     [facility]) [facility] Admission (Discharged) from [facility]  Alcohol Use Disorder Identification Test Final Score (AUDIT) 2 3    GAD-7     Office Visit from 09/23/2016 in Primary Care at [facility]  Total GAD-7 Score 17    PHQ2-9     Counselor from 09/26/2020 in [facility] Office Visit from 05/01/2018 in Primary Care at [facility] Visit from 03/06/2018 in Primary Care at [facility] Visit from 11/25/2017 in Primary Care at [facility] Visit from 10/13/2017 in Primary Care at [facility]  PHQ-2 Total Score 2 0 0 0 0  PHQ-9 Total Score 15 -- -- -- --      Assessment and Plan:  Patient to start partial hospitalization programming Continue medications as directed -We will continue to monitor Remeron 15 mg due to reports of grogginess, patient has declined to discontinue at this time as she reports it is  helped with her resting at night.  Would like to give side effects a few more days.  NP will follow up  Treatment plan was reviewed and agreed upon by NP T. Melvyn Neth inpatient Wanda Manis  [place] need for group services   Wanda Rack, NP 10/2/202111:59 AM

## 2020-09-29 ENCOUNTER — Other Ambulatory Visit: Payer: Self-pay

## 2020-09-29 ENCOUNTER — Ambulatory Visit (HOSPITAL_COMMUNITY): Payer: Medicaid Other

## 2020-09-30 ENCOUNTER — Ambulatory Visit (HOSPITAL_COMMUNITY): Payer: Medicaid Other

## 2020-10-01 ENCOUNTER — Other Ambulatory Visit: Payer: Self-pay

## 2020-10-01 ENCOUNTER — Ambulatory Visit (HOSPITAL_COMMUNITY): Payer: Medicaid Other

## 2020-10-02 ENCOUNTER — Ambulatory Visit (HOSPITAL_COMMUNITY): Payer: Medicaid Other

## 2020-10-02 ENCOUNTER — Other Ambulatory Visit: Payer: Self-pay

## 2020-10-03 ENCOUNTER — Ambulatory Visit (HOSPITAL_COMMUNITY): Payer: Medicaid Other

## 2020-10-03 ENCOUNTER — Other Ambulatory Visit: Payer: Self-pay

## 2020-10-06 ENCOUNTER — Ambulatory Visit (HOSPITAL_COMMUNITY): Payer: Medicaid Other

## 2020-10-06 ENCOUNTER — Other Ambulatory Visit: Payer: Self-pay

## 2020-10-07 ENCOUNTER — Ambulatory Visit (HOSPITAL_COMMUNITY): Payer: Medicaid Other

## 2020-10-08 ENCOUNTER — Ambulatory Visit (HOSPITAL_COMMUNITY): Payer: Medicaid Other

## 2020-10-14 ENCOUNTER — Encounter (HOSPITAL_COMMUNITY): Payer: Self-pay | Admitting: Professional

## 2020-10-15 ENCOUNTER — Telehealth (HOSPITAL_COMMUNITY): Payer: Self-pay

## 2020-10-15 MED ORDER — LISDEXAMFETAMINE DIMESYLATE 50 MG PO CAPS
50.0000 mg | ORAL_CAPSULE | Freq: Every day | ORAL | 0 refills | Status: DC
Start: 2020-10-15 — End: 2020-10-24

## 2020-10-15 NOTE — Telephone Encounter (Signed)
Rx for Vyvanse sent to her pharmacy.

## 2020-10-23 ENCOUNTER — Telehealth (HOSPITAL_COMMUNITY): Payer: Medicaid Other | Admitting: Psychiatry

## 2020-10-24 ENCOUNTER — Telehealth (INDEPENDENT_AMBULATORY_CARE_PROVIDER_SITE_OTHER): Payer: Medicaid Other | Admitting: Physician Assistant

## 2020-10-24 ENCOUNTER — Other Ambulatory Visit: Payer: Self-pay

## 2020-10-24 DIAGNOSIS — F332 Major depressive disorder, recurrent severe without psychotic features: Secondary | ICD-10-CM

## 2020-10-24 DIAGNOSIS — F900 Attention-deficit hyperactivity disorder, predominantly inattentive type: Secondary | ICD-10-CM | POA: Diagnosis not present

## 2020-10-24 DIAGNOSIS — G4700 Insomnia, unspecified: Secondary | ICD-10-CM | POA: Diagnosis not present

## 2020-10-24 DIAGNOSIS — F419 Anxiety disorder, unspecified: Secondary | ICD-10-CM

## 2020-10-25 DIAGNOSIS — G4700 Insomnia, unspecified: Secondary | ICD-10-CM | POA: Insufficient documentation

## 2020-10-25 MED ORDER — MIRTAZAPINE 15 MG PO TABS: 15.0000 mg | ORAL_TABLET | Freq: Every day | ORAL | 2 refills | Status: DC

## 2020-10-25 MED ORDER — LISDEXAMFETAMINE DIMESYLATE 60 MG PO CAPS: 60.0000 mg | ORAL_CAPSULE | Freq: Every day | ORAL | 0 refills | Status: DC

## 2020-10-25 MED ORDER — GABAPENTIN 300 MG PO CAPS: 300.0000 mg | ORAL_CAPSULE | Freq: Three times a day (TID) | ORAL | 2 refills | Status: DC

## 2020-10-25 MED ORDER — LISDEXAMFETAMINE DIMESYLATE 60 MG PO CAPS: 60.0000 mg | ORAL_CAPSULE | ORAL | 0 refills | Status: DC

## 2020-10-25 MED ORDER — LURASIDONE HCL 40 MG PO TABS: 40.0000 mg | ORAL_TABLET | Freq: Every day | ORAL | 2 refills | Status: DC

## 2020-10-25 MED ORDER — DULOXETINE HCL 30 MG PO CPEP: 30.0000 mg | ORAL_CAPSULE | Freq: Every day | ORAL | 1 refills | Status: DC

## 2020-10-25 MED ORDER — TRAZODONE HCL 100 MG PO TABS: 100.0000 mg | ORAL_TABLET | Freq: Every evening | ORAL | 2 refills | Status: DC | PRN

## 2020-10-25 MED ORDER — HYDROXYZINE HCL 25 MG PO TABS: 25.0000 mg | ORAL_TABLET | Freq: Three times a day (TID) | ORAL | 0 refills | Status: DC | PRN

## 2020-10-25 MED ORDER — GABAPENTIN 300 MG PO CAPS: 300.0000 mg | ORAL_CAPSULE | Freq: Three times a day (TID) | ORAL | 1 refills | Status: DC

## 2020-10-27 ENCOUNTER — Telehealth (HOSPITAL_COMMUNITY): Payer: Self-pay | Admitting: *Deleted

## 2020-10-27 NOTE — Telephone Encounter (Signed)
Patient called stating she did not have Rxs for the following 4 Rxs; trazodone, Latuda, Remeron and Hydroxyzine Confirmed this to be accurate with [facility] in [place] and called these f4 in as written by Witt PA.

## 2020-10-31 ENCOUNTER — Ambulatory Visit (HOSPITAL_COMMUNITY): Payer: BC Managed Care – PPO | Admitting: Clinical

## 2020-10-31 ENCOUNTER — Other Ambulatory Visit: Payer: Self-pay

## 2020-10-31 ENCOUNTER — Telehealth (HOSPITAL_COMMUNITY): Payer: Self-pay | Admitting: Clinical

## 2020-10-31 NOTE — Telephone Encounter (Signed)
Therapist sent the client a link for the virtual therapy visit via Mychart. Client did not check in. Therapist followed up with a tele-phone call to the clients cell phone number. Therapist was unable to reach the client and left a voice mail for her to get in touch with the office.

## 2020-11-10 ENCOUNTER — Telehealth (HOSPITAL_COMMUNITY): Payer: Self-pay | Admitting: *Deleted

## 2020-11-10 NOTE — Telephone Encounter (Signed)
VM message left for writer asking that all of her medications [facility] [address] [facility]. Called pharmacy to make this request but the Vyvanse Rx will of course have to be called in. Will bring this request to [facility] attention, her next Vyvanse Rx is on 11/24/20.

## 2020-11-13 ENCOUNTER — Telehealth (HOSPITAL_COMMUNITY): Payer: Self-pay | Admitting: *Deleted

## 2020-11-13 NOTE — Telephone Encounter (Signed)
VM from patient following up with a call she gave me about a week ago asking that [facility]. States it still has not been done and puts the ownership of the problem on [facility]. [facility] which I did and then we cx the [facility] at [facility] and [facility]. Will bring this to the attention of Eddie PA for his consideration.

## 2020-11-15 MED ORDER — DULOXETINE HCL 30 MG PO CPEP: 30.0000 mg | ORAL_CAPSULE | Freq: Every day | ORAL | 1 refills | Status: DC

## 2020-11-15 MED ORDER — GABAPENTIN 300 MG PO CAPS: 300.0000 mg | ORAL_CAPSULE | Freq: Three times a day (TID) | ORAL | 2 refills | Status: DC

## 2020-11-15 MED ORDER — HYDROXYZINE HCL 25 MG PO TABS: 25.0000 mg | ORAL_TABLET | Freq: Three times a day (TID) | ORAL | 1 refills | Status: DC | PRN

## 2020-11-15 MED ORDER — MIRTAZAPINE 15 MG PO TABS: 15.0000 mg | ORAL_TABLET | Freq: Every day | ORAL | 2 refills | Status: DC

## 2020-11-15 MED ORDER — LISDEXAMFETAMINE DIMESYLATE 60 MG PO CAPS: 60.0000 mg | ORAL_CAPSULE | ORAL | 0 refills | Status: DC

## 2020-11-15 MED ORDER — LURASIDONE HCL 40 MG PO TABS: 40.0000 mg | ORAL_TABLET | Freq: Every day | ORAL | 2 refills | Status: DC

## 2020-11-22 NOTE — Progress Notes (Signed)
BH MD/PA/NP OP Progress Note   Virtual Visit via Video Note  I connected with Wanda Torres on 10/24/2020 at  3:00 PM EDT by a video enabled telemedicine application and verified that I am speaking with the correct person using two identifiers.  Location: Patient: Home Provider: Clinic   I discussed the limitations of evaluation and management by telemedicine and the availability of in person appointments. The patient expressed understanding and agreed to proceed.  Follow Up Instructions:  I discussed the assessment and treatment plan with the patient. The patient was provided an opportunity to ask questions and all were answered. The patient agreed with the plan and demonstrated an understanding of the instructions.   The patient was advised to call back or seek an in-person evaluation if the symptoms worsen or if the condition fails to improve as anticipated.  I provided 29 minutes of non-face-to-face time during this encounter.  Meta Hatchet, PA   10/24/2020 3:00 PM Wanda Torres  MRN:  [id number]  Chief Complaint: Medication refills  HPI:  Wanda Torres "Wanda Torres" is a 29 year old female with a past psychiatric history significant for major depressive disorder, ADHD, anxiety, and insomnia who presents to [facility] for medication refill. Patient is currently on the following medicaitons: Cymbalta 30 mg, Vyvanse 50 mg, Gabapentin 300 mg 3 x daily, Latuda 40 mg, Remeron 14 mg, Trazodone 100 mg and Hydroxyzine 25 mg. Patient reports that she is doing well on her current regimen of medications, for the most part. Patient states that she doesn't like the way she feels after taking her Trazodone. She also reports that her Vyvanse does not make her "feel like poop" but she feels like it is not working as great. Although her Vyvanse feels more stable, she feels like it is not providing her with the focus she needs.  Patient  denies suicide and homicide ideation. Patient denies auditory and visual hallucinations. Patient reports that her sleep has gotten much better and receives between 7 - 8 hours of sleep a night. Patient reports goo appetite. Patient consumes alcohol sparingly. Patient endorses vaping (pack/day). Patient denies illicit drug use.  Visit Diagnosis:    ICD-10-CM   1. Severe episode of recurrent major depressive disorder, without psychotic features (HCC)  F33.2 DULoxetine (CYMBALTA) 30 MG capsule    lurasidone (LATUDA) 40 MG TABS tablet    mirtazapine (REMERON) 15 MG tablet    DISCONTINUED: DULoxetine (CYMBALTA) 30 MG capsule    DISCONTINUED: lurasidone (LATUDA) 40 MG TABS tablet    DISCONTINUED: mirtazapine (REMERON) 15 MG tablet  2. Attention deficit hyperactivity disorder (ADHD), predominantly inattentive type  F90.0 lisdexamfetamine (VYVANSE) 60 MG capsule    lisdexamfetamine (VYVANSE) 60 MG capsule    DISCONTINUED: lisdexamfetamine (VYVANSE) 60 MG capsule    DISCONTINUED: lisdexamfetamine (VYVANSE) 60 MG capsule    DISCONTINUED: lisdexamfetamine (VYVANSE) 60 MG capsule  3. Anxiety  F41.9 DULoxetine (CYMBALTA) 30 MG capsule    gabapentin (NEURONTIN) 300 MG capsule    DISCONTINUED: DULoxetine (CYMBALTA) 30 MG capsule    DISCONTINUED: gabapentin (NEURONTIN) 300 MG capsule  4. Insomnia, unspecified type  G47.00 traZODone (DESYREL) 100 MG tablet    hydrOXYzine (ATARAX/VISTARIL) 25 MG tablet    DISCONTINUED: gabapentin (NEURONTIN) 300 MG capsule    DISCONTINUED: hydrOXYzine (ATARAX/VISTARIL) 25 MG tablet    Past Psychiatric History: Major Depressive Disorder ADHD Recent psychiatry hospitalization [facility]  Past Medical History:  Past Medical History:  Diagnosis Date  .  Acne   . Allergy   . Anemia   . Anxiety   . Depression   . Psoriasis   . Supervision of other high risk pregnancy, antepartum 01/19/2019   Formatting of this note might be different from the original. Partner prefers  She/Her pronouns   No past surgical history on file.  Family Psychiatric History: N/A  Family History:  Family History  Problem Relation Age of Onset  . Anxiety disorder Mother   . Depression Mother   . Sexual abuse Mother   . Drug abuse Father   . ADD / ADHD Sister   . Anxiety disorder Sister   . Bipolar disorder Sister   . Depression Sister   . Drug abuse Sister   . Sexual abuse Sister   . Anxiety disorder Brother   . Depression Brother   . Sexual abuse Brother   . Alcohol abuse Maternal Aunt   . Bipolar disorder Maternal Aunt   . Depression Maternal Aunt   . Sexual abuse Maternal Aunt   . ADD / ADHD Maternal Uncle   . Alcohol abuse Maternal Uncle   . Drug abuse Maternal Uncle   . Sexual abuse Maternal Uncle   . Alcohol abuse Maternal Grandfather   . Dementia Maternal Grandfather   . Depression Maternal Grandfather   . Sexual abuse Maternal Grandfather   . Depression Maternal Grandmother   . Sexual abuse Maternal Grandmother   . Bipolar disorder Maternal Aunt   . Depression Maternal Aunt   . Sexual abuse Maternal Aunt     Social History:  Social History   Socioeconomic History  . Marital status: Divorced    Spouse name: Not on file  . Number of children: Not on file  . Years of education: Not on file  . Highest education level: Not on file  Occupational History    Comment: Supervisor reservations; 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[employer]  Tobacco Use  . Smoking status: Never Smoker  . Smokeless tobacco: Never Used  . Tobacco comment: Vaps daily   Vaping Use  . Vaping Use: Every day  . Substances: Nicotine  Substance and Sexual Activity  . Alcohol use: Yes    Alcohol/week: 1.0 standard drink    Types: 1 Standard drinks or equivalent per week    Comment: reports one a week  . Drug use: Never  . Sexual activity: Not on file  Other Topics Concern  . Not on file  Social History Narrative  . Not on file   Social Determinants of Health   Financial Resource  Strain:   . Difficulty of Paying Living Expenses: Not on file  Food Insecurity:   . Worried About Programme researcher, broadcasting/film/video in the Last Year: Not on file  . Ran Out of Food in the Last Year: Not on file  Transportation Needs:   . Lack of Transportation (Medical): Not on file  . Lack of Transportation (Non-Medical): Not on file  Physical Activity:   . Days of Exercise per Week: Not on file  . Minutes of Exercise per Session: Not on file  Stress:   . Feeling of Stress : Not on file  Social Connections:   . Frequency of Communication with Friends and Family: Not on file  . Frequency of Social Gatherings with Friends and Family: Not on file  . Attends Religious Services: Not on file  . Active Member of Clubs or Organizations: Not on file  . Attends Banker Meetings: Not on file  .  Marital Status: Not on file    Allergies: No Known Allergies  Metabolic Disorder Labs: Lab Results  Component Value Date   HGBA1C 4.4 (L) 07/09/2020   MPG 79.58 07/09/2020   No results found for: PROLACTIN Lab Results  Component Value Date   CHOL 131 07/09/2020   TRIG 143 07/09/2020   HDL 59 07/09/2020   CHOLHDL 2.2 07/09/2020   VLDL 29 07/09/2020   LDLCALC 43 07/09/2020   Lab Results  Component Value Date   TSH 2.264 07/09/2020   TSH 2.210 05/01/2018    Therapeutic Level Labs: No results found for: LITHIUM No results found for: VALPROATE No components found for:  CBMZ  Current Medications: Current Outpatient Medications  Medication Sig Dispense Refill  . DULoxetine (CYMBALTA) 30 MG capsule Take 1 capsule (30 mg total) by mouth daily. For depression 30 capsule 1  . gabapentin (NEURONTIN) 300 MG capsule Take 1 capsule (300 mg total) by mouth 3 (three) times daily. 90 capsule 2  . hydrOXYzine (ATARAX/VISTARIL) 25 MG tablet Take 1 tablet (25 mg total) by mouth 3 (three) times daily as needed for anxiety. 90 tablet 1  . [START ON 11/24/2020] lisdexamfetamine (VYVANSE) 60 MG capsule  Take 1 capsule (60 mg total) by mouth every morning. 30 capsule 0  . [START ON 12/24/2020] lisdexamfetamine (VYVANSE) 60 MG capsule Take 1 capsule (60 mg total) by mouth every morning. 30 capsule 0  . lurasidone (LATUDA) 40 MG TABS tablet Take 1 tablet (40 mg total) by mouth daily with supper. For mood control 30 tablet 2  . mirtazapine (REMERON) 15 MG tablet Take 1 tablet (15 mg total) by mouth at bedtime. For depression/sleep 30 tablet 2  . traZODone (DESYREL) 100 MG tablet Take 1 tablet (100 mg total) by mouth at bedtime as needed for sleep. 30 tablet 2   No current facility-administered medications for this visit.     Musculoskeletal: Strength & Muscle Tone: Not able to assess due to telemed visit Gait & Station: Not able to assess due to telemed visit Patient leans: Not able to assess due to telemed visit  Psychiatric Specialty Exam: Review of Systems  unknown if currently breastfeeding.There is no height or weight on file to calculate BMI.  General Appearance: Well Groomed  Eye Contact:  Good  Speech:  Clear and Coherent and Normal Rate  Volume:  Normal  Mood:  Euthymic  Affect:  Appropriate  Thought Process:  Coherent and Goal Directed  Orientation:  Full (Time, Place, and Person)  Thought Content: Logical   Suicidal Thoughts:  No  Homicidal Thoughts:  No  Memory:  Immediate;   Good Recent;   Good Remote;   Good  Judgement:  Good  Insight:  Fair  Psychomotor Activity:  Normal  Concentration:  Concentration: Good and Attention Span: Good  Recall:  Good  Fund of Knowledge: Good  Language: Good  Akathisia:  NA  Handed:  Right  AIMS (if indicated): not done  Assets:  Communication Skills Desire for Improvement Financial Resources/Insurance Housing  ADL's:  Intact  Cognition: WNL  Sleep:  Good   Screenings: AIMS     Ad[facility] (Dis[facility]) [facility]  AIMS Total Score 0    AUDIT     Ad[facility]) [facility] Admission (Discharged) from [facility]  Alcohol Use Disorder Identification Test Final Score (AUDIT)  2 3    GAD-7     Office Visit from 09/23/2016 in [facility] Care at [facility]  Total GAD-7 Score 17    PHQ2-9     Counselor from 09/26/2020 in [facility] Office Visit from 05/01/2018 in Primary Care at [facility] Visit from 03/06/2018 in Primary Care at [facility] Visit from 11/25/2017 in Primary Care at [facility] Visit from 10/13/2017 in Primary Care at [facility] Total Score 2 0 0 0 0  PHQ-9 Total Score 15 -- -- -- --       Assessment and Plan:  Wanda Torres "Wanda Torres" is a 29 year old female with a past psychiatric history significant for major depressive disorder, ADHD, anxiety, and insomnia who presents to [facility] for medication refill. Patient is currently on the following medicaitons: Cymbalta 30 mg, Vyvanse 50 mg, Gabapentin 300 mg 3 x daily, Latuda 40 mg, Remeron 14 mg, Trazodone 100 mg and Hydroxyzine 25 mg.  Patient states that she doesn't like the way she feels after taking her Trazodone.  - Patient was encouraged to continue taking medications as scheduled  Patient reports that her Vyvanse does not make her "feel like poop" but she feels like it is not working as great. Although her Vyvanse feels more stable, she feels like it is not providing her with the focus she needs. - Patient's Vyvanse was increased from 50 mg to 60 mg for the improvement of focus.  Refills were placed for the patient's medications.  1. Severe episode of recurrent major depressive disorder, without psychotic features (HCC)  - DULoxetine (CYMBALTA) 30 MG capsule; Take 1 capsule (30 mg total) by mouth daily. For depression  Dispense: 30 capsule; Refill: 1 - lurasidone (LATUDA) 40 MG  TABS tablet; Take 1 tablet (40 mg total) by mouth daily with supper. For mood control  Dispense: 30 tablet; Refill: 2 - mirtazapine (REMERON) 15 MG tablet; Take 1 tablet (15 mg total) by mouth at bedtime. For depression/sleep  Dispense: 30 tablet; Refill: 2  2. Attention deficit hyperactivity disorder (ADHD), predominantly inattentive type  - lisdexamfetamine (VYVANSE) 60 MG capsule; Take 1 capsule (60 mg total) by mouth every morning.  Dispense: 30 capsule; Refill: 0 - lisdexamfetamine (VYVANSE) 60 MG capsule; Take 1 capsule (60 mg total) by mouth every morning.  Dispense: 30 capsule; Refill: 0  3. Anxiety  - gabapentin (NEURONTIN) 300 MG capsule; Take 1 capsule (300 mg total) by mouth 3 (three) times daily.  Dispense: 90 capsule; Refill: 2 - DULoxetine (CYMBALTA) 30 MG capsule; Take 1 capsule (30 mg total) by mouth daily. For depression  Dispense: 30 capsule; Refill: 1  4. Insomnia, unspecified type  - hydrOXYzine (ATARAX/VISTARIL) 25 MG tablet; Take 1 tablet (25 mg total) by mouth 3 (three) times daily as needed for anxiety.  Dispense: 90 tablet; Refill: 1 - traZODone (DESYREL) 100 MG tablet; Take 1 tablet (100 mg total) by mouth at bedtime as needed for sleep.  Dispense: 30 tablet; Refill: 2  Patient to follow up in 2 months  Meta Hatchet, PA 11/22/2020, 10:33 PM

## 2020-11-23 ENCOUNTER — Encounter (HOSPITAL_COMMUNITY): Payer: Self-pay | Admitting: Physician Assistant

## 2020-11-23 MED ORDER — HYDROXYZINE HCL 25 MG PO TABS: 25.0000 mg | ORAL_TABLET | Freq: Three times a day (TID) | ORAL | 1 refills | Status: DC | PRN

## 2020-11-23 MED ORDER — TRAZODONE HCL 100 MG PO TABS: 100.0000 mg | ORAL_TABLET | Freq: Every evening | ORAL | 2 refills | Status: DC | PRN

## 2020-11-23 MED ORDER — MIRTAZAPINE 15 MG PO TABS: 15.0000 mg | ORAL_TABLET | Freq: Every day | ORAL | 2 refills | Status: DC

## 2020-11-23 MED ORDER — DULOXETINE HCL 30 MG PO CPEP: 30.0000 mg | ORAL_CAPSULE | Freq: Every day | ORAL | 1 refills | Status: DC

## 2020-11-23 MED ORDER — LURASIDONE HCL 40 MG PO TABS: 40.0000 mg | ORAL_TABLET | Freq: Every day | ORAL | 2 refills | Status: DC

## 2020-11-26 ENCOUNTER — Telehealth (HOSPITAL_COMMUNITY): Payer: Self-pay | Admitting: *Deleted

## 2020-11-26 NOTE — Telephone Encounter (Signed)
Called saying her pharmacy told her we needed to do prior auth on three of her meds. Called her pharmacy and pharmacist clarified her medicaid is showing she has additional coverage and until she clears that up they cant fill and release any of her medicines. Also, a safety document is required from [place] on her [place]. Writer will submit this to them today, but they still cant release the Latuda without her clarifying her coverage.

## 2020-12-03 NOTE — Telephone Encounter (Signed)
[facility] was contacted and patient's medications were successfully called in.

## 2020-12-03 NOTE — Telephone Encounter (Signed)
Patient's orignial pharmacy [facility]) was contacted multiple times in an attempt to send [facility] out to [facility] at [address] in [place]. When contacting [facility], writer was placed on hold multiple times before the line was disconnected. Writer ended up calling [facility] to call in medications instead.

## 2020-12-30 ENCOUNTER — Telehealth (HOSPITAL_COMMUNITY): Payer: Medicaid Other | Admitting: Psychiatry

## 2020-12-30 ENCOUNTER — Other Ambulatory Visit: Payer: Self-pay

## 2021-02-11 ENCOUNTER — Telehealth (INDEPENDENT_AMBULATORY_CARE_PROVIDER_SITE_OTHER): Payer: Medicaid Other | Admitting: Physician Assistant

## 2021-02-11 ENCOUNTER — Encounter (HOSPITAL_COMMUNITY): Payer: Self-pay | Admitting: Physician Assistant

## 2021-02-11 ENCOUNTER — Other Ambulatory Visit: Payer: Self-pay

## 2021-02-11 DIAGNOSIS — F419 Anxiety disorder, unspecified: Secondary | ICD-10-CM

## 2021-02-11 DIAGNOSIS — G4700 Insomnia, unspecified: Secondary | ICD-10-CM

## 2021-02-11 DIAGNOSIS — F900 Attention-deficit hyperactivity disorder, predominantly inattentive type: Secondary | ICD-10-CM

## 2021-02-11 DIAGNOSIS — F330 Major depressive disorder, recurrent, mild: Secondary | ICD-10-CM

## 2021-02-11 MED ORDER — TRAZODONE HCL 50 MG PO TABS: 50.0000 mg | ORAL_TABLET | Freq: Every evening | ORAL | 2 refills | Status: DC | PRN

## 2021-02-11 MED ORDER — DULOXETINE HCL 30 MG PO CPEP: 30.0000 mg | ORAL_CAPSULE | Freq: Every day | ORAL | 2 refills | Status: DC

## 2021-02-11 MED ORDER — LISDEXAMFETAMINE DIMESYLATE 60 MG PO CAPS: 60.0000 mg | ORAL_CAPSULE | ORAL | 0 refills | Status: DC

## 2021-02-11 MED ORDER — BUSPIRONE HCL 7.5 MG PO TABS: 7.5000 mg | ORAL_TABLET | Freq: Two times a day (BID) | ORAL | 1 refills | Status: DC

## 2021-02-11 MED ORDER — LISDEXAMFETAMINE DIMESYLATE 70 MG PO CAPS: 70.0000 mg | ORAL_CAPSULE | ORAL | 0 refills | Status: DC

## 2021-02-11 MED ORDER — LURASIDONE HCL 40 MG PO TABS: 40.0000 mg | ORAL_TABLET | Freq: Every day | ORAL | 2 refills | Status: DC

## 2021-02-11 NOTE — Progress Notes (Signed)
BH MD/PA/NP OP Progress Note  Virtual Visit via Telephone Note  I connected with Wanda Torres on 02/11/21 at  4:00 PM EST by telephone and verified that I am speaking with the correct person using two identifiers.  Location: Patient: Home Provider: Clinic   I discussed the limitations, risks, security and privacy concerns of performing an evaluation and management service by telephone and the availability of in person appointments. I also discussed with the patient that there may be a patient responsible charge related to this service. The patient expressed understanding and agreed to proceed.  Follow Up Instructions:  I discussed the assessment and treatment plan with the patient. The patient was provided an opportunity to ask questions and all were answered. The patient agreed with the plan and demonstrated an understanding of the instructions.   The patient was advised to call back or seek an in-person evaluation if the symptoms worsen or if the condition fails to improve as anticipated.  I provided 29 minutes of non-face-to-face time during this encounter.  Meta Hatchet, PA   02/11/2021 5:26 PM Wanda Torres  MRN:  [id number]  Chief Complaint: Follow-up and medication management  HPI:  Wanda Torres "Wanda Torres" is a 30 year old female with a past psychiatric history significant for anxiety, major depressive disorder, insomnia, and ADHD who presents to [facility] via virtual telephone visit for follow-up and medication management.  Patient is currently being managed on the following medications:  Duloxetine 30 mg daily Lurasidone 40 mg daily with supper Mirtazapine 15 mg at bedtime Vyvanse 60 mg daily Gabapentin 300 mg 3 times daily  Patient reports that she is no longer taking mirtazapine.  Patient states that she discontinued taking mirtazapine after gaining 40 pounds in 4 months.  Patient reports that her sleep  has been okay since discontinuing mirtazapine.  On the nights where she is experiencing sleep disturbances, she will use an old prescription of trazodone to help her with her sleep.  Patient also expresses that she has been experiencing lower leg and foot swelling.  Patient was informed that gabapentin can cause peripheral edema as a side effect.  Patient states that she has an appointment scheduled with her rheumatology to determine the cause of her lower leg and foot swelling.  Patient expresses that she would like to discontinue taking gabapentin and be placed on a medication that will help her anxiety.  Lastly, patient states that she has noticed decreased concentration with her current Vyvanse medication.  Patient states that she experiences a drop off in her focus after lunch.  Patient reports that the medication may work for 4 hours before she starts to lose concentration and focus.  Patient has tried drinking coffee to help improve her focus with little success.  Patient is interested in being placed on Adderall extended release to help with her focus.  Patient is pleasant, calm, cooperative, and fully engaged in conversation during the encounter.  Patient reports that her mood has been really good other than the pain that she experiences from her lower leg and foot swelling.  Patient denies suicidal and homicidal ideations.  She further denies auditory or visual hallucinations.  Patient reports that she is currently working a day job and going back to school for cosmetology.  Patient reports that since working his sleep schedule has become a bit "wonky."  Patient endorses good sleep and sleeps on average 6 to 7 hours each night.  Patient endorses appetite and  eats on average 2-3 meals per day.  Patient endorses occasional alcohol use and may drink once a week.  Patient reports that she is currently vaping and make it through 1 pod each day.  Patient denies current illicit drug use.  Visit Diagnosis:     ICD-10-CM   1. Anxiety  F41.9 DULoxetine (CYMBALTA) 30 MG capsule    busPIRone (BUSPAR) 7.5 MG tablet  2. Insomnia, unspecified type  G47.00 traZODone (DESYREL) 50 MG tablet  3. Mild episode of recurrent major depressive disorder (HCC)  F33.0 DULoxetine (CYMBALTA) 30 MG capsule    lurasidone (LATUDA) 40 MG TABS tablet  4. Attention deficit hyperactivity disorder (ADHD), predominantly inattentive type  F90.0 lisdexamfetamine (VYVANSE) 70 MG capsule    DISCONTINUED: lisdexamfetamine (VYVANSE) 60 MG capsule    Past Psychiatric History:  Major Depressive Disorder ADHD Insomnia Anxiety  Past Medical History:  Past Medical History:  Diagnosis Date  . Acne   . Allergy   . Anemia   . Anxiety   . Depression   . Psoriasis   . Supervision of other high risk pregnancy, antepartum 01/19/2019   Formatting of this note might be different from the original. Partner prefers She/Her pronouns   History reviewed. No pertinent surgical history.  Family Psychiatric History: N/A  Family History:  Family History  Problem Relation Age of Onset  . Anxiety disorder Mother   . Depression Mother   . Sexual abuse Mother   . Drug abuse Father   . ADD / ADHD Sister   . Anxiety disorder Sister   . Bipolar disorder Sister   . Depression Sister   . Drug abuse Sister   . Sexual abuse Sister   . Anxiety disorder Brother   . Depression Brother   . Sexual abuse Brother   . Alcohol abuse Maternal Aunt   . Bipolar disorder Maternal Aunt   . Depression Maternal Aunt   . Sexual abuse Maternal Aunt   . ADD / ADHD Maternal Uncle   . Alcohol abuse Maternal Uncle   . Drug abuse Maternal Uncle   . Sexual abuse Maternal Uncle   . Alcohol abuse Maternal Grandfather   . Dementia Maternal Grandfather   . Depression Maternal Grandfather   . Sexual abuse Maternal Grandfather   . Depression Maternal Grandmother   . Sexual abuse Maternal Grandmother   . Bipolar disorder Maternal Aunt   . Depression Maternal  Aunt   . Sexual abuse Maternal Aunt     Social History:  Social History   Socioeconomic History  . Marital status: Divorced    Spouse name: Not on file  . Number of children: Not on file  . Years of education: Not on file  . Highest education level: Not on file  Occupational History    Comment: Supervisor reservations; 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[employer]  Tobacco Use  . Smoking status: Never Smoker  . Smokeless tobacco: Never Used  . Tobacco comment: Vaps daily   Vaping Use  . Vaping Use: Every day  . Substances: Nicotine  Substance and Sexual Activity  . Alcohol use: Yes    Alcohol/week: 1.0 standard drink    Types: 1 Standard drinks or equivalent per week    Comment: reports one a week  . Drug use: Never  . Sexual activity: Not on file  Other Topics Concern  . Not on file  Social History Narrative  . Not on file   Social Determinants of Health   Financial Resource Strain: Not on  file  Food Insecurity: Not on file  Transportation Needs: Not on file  Physical Activity: Not on file  Stress: Not on file  Social Connections: Not on file    Allergies: No Known Allergies  Metabolic Disorder Labs: Lab Results  Component Value Date   HGBA1C 4.4 (L) 07/09/2020   MPG 79.58 07/09/2020   No results found for: PROLACTIN Lab Results  Component Value Date   CHOL 131 07/09/2020   TRIG 143 07/09/2020   HDL 59 07/09/2020   CHOLHDL 2.2 07/09/2020   VLDL 29 07/09/2020   LDLCALC 43 07/09/2020   Lab Results  Component Value Date   TSH 2.264 07/09/2020   TSH 2.210 05/01/2018    Therapeutic Level Labs: No results found for: LITHIUM No results found for: VALPROATE No components found for:  CBMZ  Current Medications: Current Outpatient Medications  Medication Sig Dispense Refill  . busPIRone (BUSPAR) 7.5 MG tablet Take 1 tablet (7.5 mg total) by mouth 2 (two) times daily. 60 tablet 1  . DULoxetine (CYMBALTA) 30 MG capsule Take 1 capsule (30 mg total) by mouth daily. For  depression 30 capsule 2  . gabapentin (NEURONTIN) 300 MG capsule Take 1 capsule (300 mg total) by mouth 3 (three) times daily. 90 capsule 2  . hydrOXYzine (ATARAX/VISTARIL) 25 MG tablet Take 1 tablet (25 mg total) by mouth 3 (three) times daily as needed for anxiety. 90 tablet 1  . lisdexamfetamine (VYVANSE) 70 MG capsule Take 1 capsule (70 mg total) by mouth every morning. 30 capsule 0  . lurasidone (LATUDA) 40 MG TABS tablet Take 1 tablet (40 mg total) by mouth daily with supper. For mood control 30 tablet 2  . traZODone (DESYREL) 50 MG tablet Take 1 tablet (50 mg total) by mouth at bedtime as needed for sleep. 30 tablet 2   No current facility-administered medications for this visit.     Musculoskeletal: Strength & Muscle Tone: Unable to assess due to telemedicine visit Gait & Station: Unable to assess due to telemedicine visit Patient leans: Unable to assess due to telemedicine visit  Psychiatric Specialty Exam: Review of Systems  Psychiatric/Behavioral: Positive for decreased concentration. Negative for dysphoric mood, hallucinations, self-injury, sleep disturbance and suicidal ideas. The patient is not nervous/anxious and is not hyperactive.     unknown if currently breastfeeding.There is no height or weight on file to calculate BMI.  General Appearance: Unable to assess due to telemedicine visit  Eye Contact:  Unable to assess due to telemedicine visit  Speech:  Clear and Coherent and Normal Rate  Volume:  Normal  Mood:  Euthymic  Affect:  Appropriate  Thought Process:  Coherent, Goal Directed and Descriptions of Associations: Intact  Orientation:  Full (Time, Place, and Person)  Thought Content: WDL and Logical   Suicidal Thoughts:  No  Homicidal Thoughts:  No  Memory:  Immediate;   Good Recent;   Good Remote;   Good  Judgement:  Good  Insight:  Good  Psychomotor Activity:  Normal  Concentration:  Concentration: Good and Attention Span: Good  Recall:  Good  Fund of  Knowledge: Good  Language: Good  Akathisia:  NA  Handed:  Right  AIMS (if indicated): not done  Assets:  Communication Skills Desire for Improvement Financial Resources/Insurance Housing Vocational/Educational  ADL's:  Intact  Cognition: WNL  Sleep:  Good   Screenings: AIMS   Flowsheet Row Ad[facility] (Discharged) [facility]  AIMS Total Score  0    AUDIT   Flowsheet Row [facility]) [facility] Admission (Discharged) from [facility]  Alcohol Use Disorder Identification Test Final Score (AUDIT) 2 3    GAD-7   Flowsheet Row Video Visit from 02/11/2021 in [facility] Office Visit from 09/23/2016 in [facility] at [facility]  Total GAD-7 Score 4 17    PHQ2-9   Flowsheet Row Video Visit from 02/11/2021 in [facility] Counselor from 09/26/2020 in [facility] Office Visit from 05/01/2018 in [facility] Care at [facility] Visit from 03/06/2018 in Primary Care at [facility] Visit from 11/25/2017 in Primary Care at [facility] Total Score 1 2 0 0 0  PHQ-9 Total Score - 15 - - -    Flowsheet Row Video Visit from 02/11/2021 in [facility] Admission (Discharged) [facility] Admission (Discharged) from [facility]  C-SSRS RISK CATEGORY Moderate Risk High Risk High Risk       Assessment and Plan:   Wanda Torres "Wanda Torres" is a 30 year old female with a past psychiatric history significant for anxiety, major depressive disorder, insomnia, and ADHD who presents to [facility] via virtual telephone visit for follow-up and medication  management.  Patient reports that she has discontinued her mirtazapine due to weight gain.  Prescription for trazodone 50 mg at bedtime will be written upon conclusion of the encounter.  Patient also reports that she has been experiencing peripheral edema.  Patient was advised that gabapentin can cause peripheral edema as a side effect.  Patient was recommended buspirone 7.5 mg 2 times daily for the management of her anxiety in exchange for gabapentin.  Patient was agreeable to recommendation.  Patient also reports that she has been experiencing decreased focus and concentration while on Vyvanse 60 mg daily.  Patient was recommended increasing her dose of Vyvanse from 60 mg to 70 mg to see if it improves her focus and concentration for longer period of time.  Patient was agreeable to recommendation.  Patient's medications will be e-prescribed to pharmacy of choice.  1. Anxiety  - DULoxetine (CYMBALTA) 30 MG capsule; Take 1 capsule (30 mg total) by mouth daily. For depression  Dispense: 30 capsule; Refill: 2 - busPIRone (BUSPAR) 7.5 MG tablet; Take 1 tablet (7.5 mg total) by mouth 2 (two) times daily.  Dispense: 60 tablet; Refill: 1  2. Insomnia, unspecified type  - traZODone (DESYREL) 50 MG tablet; Take 1 tablet (50 mg total) by mouth at bedtime as needed for sleep.  Dispense: 30 tablet; Refill: 2  3. Mild episode of recurrent major depressive disorder (HCC)  - DULoxetine (CYMBALTA) 30 MG capsule; Take 1 capsule (30 mg total) by mouth daily. For depression  Dispense: 30 capsule; Refill: 2 - lurasidone (LATUDA) 40 MG TABS tablet; Take 1 tablet (40 mg total) by mouth daily with supper. For mood control  Dispense: 30 tablet; Refill: 2  4. Attention deficit hyperactivity disorder (ADHD), predominantly inattentive type  - lisdexamfetamine (VYVANSE) 70 MG capsule; Take 1 capsule (70 mg total) by mouth every morning.  Dispense: 30 capsule; Refill: 0  Patient to follow-up in 6 weeks  Meta Hatchet,  PA 02/11/2021, 5:26 PM

## 2021-03-10 ENCOUNTER — Telehealth (HOSPITAL_COMMUNITY): Payer: Self-pay | Admitting: *Deleted

## 2021-03-10 ENCOUNTER — Other Ambulatory Visit (HOSPITAL_COMMUNITY): Payer: Self-pay | Admitting: Physician Assistant

## 2021-03-10 DIAGNOSIS — F900 Attention-deficit hyperactivity disorder, predominantly inattentive type: Secondary | ICD-10-CM

## 2021-03-10 MED ORDER — LISDEXAMFETAMINE DIMESYLATE 70 MG PO CAPS: 70.0000 mg | ORAL_CAPSULE | ORAL | 0 refills | Status: DC

## 2021-03-10 NOTE — Progress Notes (Signed)
Provider was contacted by Suzanne K Beck, RN regarding patient's medication. Patient's medication will be e-prescribed to pharmacy of choice. 

## 2021-03-10 NOTE — Telephone Encounter (Signed)
Provider was contacted by Suzanne K Beck, RN regarding patient's medication. Patient's medication will be e-prescribed to pharmacy of choice. 

## 2021-03-10 NOTE — Telephone Encounter (Signed)
Received request from patients pharmacy for her to have 90 day supply of her trazodone and buspar. While on phone she also said she needs a rx of her Vyvanse called in, she thinks its working well. Will make Eddie PA aware and fax requests back to pharmacy and have him call in her Vyvanse, she should be out per the chart.

## 2021-03-26 ENCOUNTER — Encounter (HOSPITAL_COMMUNITY): Payer: Self-pay | Admitting: Physician Assistant

## 2021-03-26 ENCOUNTER — Telehealth (INDEPENDENT_AMBULATORY_CARE_PROVIDER_SITE_OTHER): Payer: BC Managed Care – PPO | Admitting: Physician Assistant

## 2021-03-26 ENCOUNTER — Other Ambulatory Visit: Payer: Self-pay

## 2021-03-26 DIAGNOSIS — F900 Attention-deficit hyperactivity disorder, predominantly inattentive type: Secondary | ICD-10-CM

## 2021-03-26 DIAGNOSIS — F330 Major depressive disorder, recurrent, mild: Secondary | ICD-10-CM

## 2021-03-26 DIAGNOSIS — F3160 Bipolar disorder, current episode mixed, unspecified: Secondary | ICD-10-CM | POA: Insufficient documentation

## 2021-03-26 DIAGNOSIS — F411 Generalized anxiety disorder: Secondary | ICD-10-CM

## 2021-03-26 DIAGNOSIS — G4700 Insomnia, unspecified: Secondary | ICD-10-CM | POA: Diagnosis not present

## 2021-03-26 DIAGNOSIS — F3162 Bipolar disorder, current episode mixed, moderate: Secondary | ICD-10-CM | POA: Insufficient documentation

## 2021-03-26 DIAGNOSIS — F419 Anxiety disorder, unspecified: Secondary | ICD-10-CM

## 2021-03-26 MED ORDER — LURASIDONE HCL 60 MG PO TABS: 60.0000 mg | ORAL_TABLET | Freq: Every day | ORAL | 2 refills | Status: DC

## 2021-03-26 MED ORDER — BUSPIRONE HCL 10 MG PO TABS: 10.0000 mg | ORAL_TABLET | Freq: Two times a day (BID) | ORAL | 2 refills | Status: DC

## 2021-03-26 NOTE — Progress Notes (Signed)
BH MD/PA/NP OP Progress Note  Virtual Visit via Telephone Note  I connected with Wanda Torres on 03/26/21 at  4:00 PM EDT by telephone and verified that I am speaking with the correct person using two identifiers.  Location: Patient: Home Provider: Clinic   I discussed the limitations, risks, security and privacy concerns of performing an evaluation and management service by telephone and the availability of in person appointments. I also discussed with the patient that there may be a patient responsible charge related to this service. The patient expressed understanding and agreed to proceed.  Follow Up Instructions:  I discussed the assessment and treatment plan with the patient. The patient was provided an opportunity to ask questions and all were answered. The patient agreed with the plan and demonstrated an understanding of the instructions.   The patient was advised to call back or seek an in-person evaluation if the symptoms worsen or if the condition fails to improve as anticipated.  I provided 17 minutes of non-face-to-face time during this encounter.  Meta Hatchet, PA   03/26/2021 4:29 PM Wanda Torres  MRN:  [id number]  Chief Complaint: Follow up and medication management  HPI:   Wanda Torres "Wanda Torres" is a 30 year old female with a past psychiatric history significant for generalized anxiety disorder, major depressive disorder, insomnia, and ADHD who presents to [facility] via virtual telephone visit for follow-up and medication management.  Patient is currently being managed on the following medications:   Duloxetine 30 mg daily Buspirone 7.5 mg 2 times daily Trazodone 50 mg at bedtime Lurasidone 40 mg at suppertime Vyvanse 70 mg every morning  Patient reports no issues or concerns with her current medication regimen but states that her buspirone has not been as helpful in the management of her anxiety  as gabapentin was.  Patient does report that her feet are doing much better since discontinuing gabapentin.  Patient was informed that there were still room for dosage adjustments with her buspirone.  Patient is interested in adjusting her dosage of buspirone for the management of her anxiety.  Patient also expresses that even though her depression has been under control as of late she still experiences set periods of mania.  Patient expresses that her manic symptoms will occur for roughly 2 weeks followed by 2 to 3 weeks depressed mood.  Patient expresses that this shifting from mild mania to lower mood has been exhausting and she often feels burnt out.  Patient expresses that everything else is fine and that she has no other concerns.  Patient is pleasant, calm, cooperative, and fully engaged in conversation during the encounter.  Patient reports that she is in a pretty good mood.  Patient denies suicidal and homicidal ideations.  She further denies auditory or visual hallucinations and does not appear to be responding to internal/external stimuli.  Patient endorses good sleep and receives on average 7 hours of sleep each night.  Patient endorses appetite but states that her appetite is often decreased due to her Vyvanse use.  Patient reports that she tries to have at least 6 small meals per day and manages to have a decent meal during dinner when taking her lurasidone.  Patient endorses alcohol consumption sparingly.  Patient endorses vaping and states that she vapes all day.  Patient denies illicit drug use.  Visit Diagnosis:    ICD-10-CM   1. Attention deficit hyperactivity disorder (ADHD), predominantly inattentive type  F90.0   2.  Generalized anxiety disorder  F41.1   3. Insomnia, unspecified type  G47.00   4. Bipolar disorder, current episode mixed, moderate (HCC)  F31.62   5. Anxiety  F41.9 busPIRone (BUSPAR) 10 MG tablet  6. Mild episode of recurrent major depressive disorder (HCC)  F33.0  lurasidone 60 MG TABS    Past Psychiatric History:  Major Depressive Disorder ADHD Insomnia Anxiety  Past Medical History:  Past Medical History:  Diagnosis Date  . Acne   . Allergy   . Anemia   . Anxiety   . Depression   . Psoriasis   . Supervision of other high risk pregnancy, antepartum 01/19/2019   Formatting of this note might be different from the original. Partner prefers She/Her pronouns   History reviewed. No pertinent surgical history.  Family Psychiatric History:  N/A  Family History:  Family History  Problem Relation Age of Onset  . Anxiety disorder Mother   . Depression Mother   . Sexual abuse Mother   . Drug abuse Father   . ADD / ADHD Sister   . Anxiety disorder Sister   . Bipolar disorder Sister   . Depression Sister   . Drug abuse Sister   . Sexual abuse Sister   . Anxiety disorder Brother   . Depression Brother   . Sexual abuse Brother   . Alcohol abuse Maternal Aunt   . Bipolar disorder Maternal Aunt   . Depression Maternal Aunt   . Sexual abuse Maternal Aunt   . ADD / ADHD Maternal Uncle   . Alcohol abuse Maternal Uncle   . Drug abuse Maternal Uncle   . Sexual abuse Maternal Uncle   . Alcohol abuse Maternal Grandfather   . Dementia Maternal Grandfather   . Depression Maternal Grandfather   . Sexual abuse Maternal Grandfather   . Depression Maternal Grandmother   . Sexual abuse Maternal Grandmother   . Bipolar disorder Maternal Aunt   . Depression Maternal Aunt   . Sexual abuse Maternal Aunt     Social History:  Social History   Socioeconomic History  . Marital status: Divorced    Spouse name: Not on file  . Number of children: Not on file  . Years of education: Not on file  . Highest education level: Not on file  Occupational History    Comment: Supervisor reservations; 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[employer]  Tobacco Use  . Smoking status: Never Smoker  . Smokeless tobacco: Never Used  . Tobacco comment: Vaps daily   Vaping Use  .  Vaping Use: Every day  . Substances: Nicotine  Substance and Sexual Activity  . Alcohol use: Yes    Alcohol/week: 1.0 standard drink    Types: 1 Standard drinks or equivalent per week    Comment: reports one a week  . Drug use: Never  . Sexual activity: Not on file  Other Topics Concern  . Not on file  Social History Narrative  . Not on file   Social Determinants of Health   Financial Resource Strain: Not on file  Food Insecurity: Not on file  Transportation Needs: Not on file  Physical Activity: Not on file  Stress: Not on file  Social Connections: Not on file    Allergies: No Known Allergies  Metabolic Disorder Labs: Lab Results  Component Value Date   HGBA1C 4.4 (L) 07/09/2020   MPG 79.58 07/09/2020   No results found for: PROLACTIN Lab Results  Component Value Date   CHOL 131 07/09/2020   TRIG  143 07/09/2020   HDL 59 07/09/2020   CHOLHDL 2.2 07/09/2020   VLDL 29 07/09/2020   LDLCALC 43 07/09/2020   Lab Results  Component Value Date   TSH 2.264 07/09/2020   TSH 2.210 05/01/2018    Therapeutic Level Labs: No results found for: LITHIUM No results found for: VALPROATE No components found for:  CBMZ  Current Medications: Current Outpatient Medications  Medication Sig Dispense Refill  . busPIRone (BUSPAR) 10 MG tablet Take 1 tablet (10 mg total) by mouth 2 (two) times daily. 60 tablet 2  . DULoxetine (CYMBALTA) 30 MG capsule Take 1 capsule (30 mg total) by mouth daily. For depression 30 capsule 2  . gabapentin (NEURONTIN) 300 MG capsule Take 1 capsule (300 mg total) by mouth 3 (three) times daily. 90 capsule 2  . hydrOXYzine (ATARAX/VISTARIL) 25 MG tablet Take 1 tablet (25 mg total) by mouth 3 (three) times daily as needed for anxiety. 90 tablet 1  . lisdexamfetamine (VYVANSE) 70 MG capsule Take 1 capsule (70 mg total) by mouth every morning. 30 capsule 0  . lurasidone 60 MG TABS Take 1 tablet (60 mg total) by mouth daily with supper. For mood control 30  tablet 2  . traZODone (DESYREL) 50 MG tablet Take 1 tablet (50 mg total) by mouth at bedtime as needed for sleep. 30 tablet 2   No current facility-administered medications for this visit.     Musculoskeletal: Strength & Muscle Tone: Unable to assess due to telemedicine visit Gait & Station: Unable to assess due to telemedicine visit Patient leans: Unable to assess due to telemedicine visit  Psychiatric Specialty Exam: Review of Systems  Psychiatric/Behavioral: Negative for decreased concentration, dysphoric mood, hallucinations, self-injury, sleep disturbance and suicidal ideas. The patient is nervous/anxious. The patient is not hyperactive.     unknown if currently breastfeeding.There is no height or weight on file to calculate BMI.  General Appearance: Unable to assess due to telemedicine visit  Eye Contact:  Unable to assess due to telemedicine visit  Speech:  Clear and Coherent and Normal Rate  Volume:  Normal  Mood:  Anxious and Euthymic  Affect:  Appropriate and Congruent  Thought Process:  Coherent, Goal Directed and Descriptions of Associations: Intact  Orientation:  Full (Time, Place, and Person)  Thought Content: WDL   Suicidal Thoughts:  No  Homicidal Thoughts:  No  Memory:  Immediate;   Good Recent;   Good Remote;   Good  Judgement:  Good  Insight:  Good  Psychomotor Activity:  Normal  Concentration:  Concentration: Good and Attention Span: Good  Recall:  Good  Fund of Knowledge: Good  Language: Good  Akathisia:  NA  Handed:  Right  AIMS (if indicated): not done  Assets:  Communication Skills Desire for Improvement Financial Resources/Insurance Housing Vocational/Educational  ADL's:  Intact  Cognition: WNL  Sleep:  Good   Screenings: AIMS   Flowsheet Row [facility] (Dis[facility]  AIMS Total Score 0    AUDIT   Flowsheet Row [facility]) [facility] Admission (Discharged) from [facility]  Alcohol Use Disorder Identification Test Final Score (AUDIT) 2 3    GAD-7   Flowsheet Row Video Visit from 03/26/2021 in [facility] Video Visit from 02/11/2021 in [facility] Office Visit from 09/23/2016 in Primary Care at  [place]  Total GAD-7 Score 8 4 17     PHQ2-9   Flowsheet Row Video Visit from 03/26/2021 in [facility] Video Visit from 02/11/2021 in [facility] Counselor from 09/26/2020 in [facility] Office Visit from 05/01/2018 in [facility] at [facility] Visit from 03/06/2018 in [facility] at [facility] Total Score 0 1 2 0 0  PHQ-9 Total Score -- -- 15 -- --    Flowsheet Row Video Visit from 03/26/2021 in [facility] Video Visit from 02/11/2021 in [facility] Admission (Discharged[facility]  C-SSRS RISK CATEGORY Moderate Risk Moderate Risk High Risk       Assessment and Plan:   09/18/2020 "Wanda Torres" is a 30 year old female with a past psychiatric history significant for generalized anxiety disorder, major depressive disorder, insomnia, and ADHD who presents to [facility] via virtual telephone visit for follow-up and medication management.  Patient reports that there is room for improvement for the management of her anxiety through the use of buspirone.  Patient was recommended adjusting her dosage of buspirone from 7.5 mg to 10 mg 2 times daily for the management of her anxiety.  Patient is agreeable to recommendation.  Patient also expresses that she has been experiencing both mild instances of mania for 2 weeks at a time and depressed mood for 2 to  3 weeks at a time.  Patient reports that the shifting from mania to depressed state is exhausting and often leaves her burnt out.  Patient was recommended adjusting her dosage of lurasidone from 40 mg to 60 mg at suppertime for the management of her manic and depressive symptoms.  Patient was agreeable to recommendation.  Patient's medications will be e-prescribed to pharmacy of choice.  1. Attention deficit hyperactivity disorder (ADHD), predominantly inattentive type Patient to continue taking Vyvanse 70 mg daily for the management of ADHD  2. Generalized anxiety disorder Patient to continue taking duloxetine 30 mg daily for the management of her anxiety  - busPIRone (BUSPAR) 10 MG tablet; Take 1 tablet (10 mg total) by mouth 2 (two) times daily.  Dispense: 60 tablet; Refill: 2  3. Insomnia, unspecified type Patient to continue taking trazodone 50 mg for the management of her insomnia  4. Bipolar disorder, current episode mixed, moderate (HCC) Patient to continue taking duloxetine 30 mg daily for the management of her bipolar disorder  - lurasidone 60 MG TABS; Take 1 tablet (60 mg total) by mouth daily with supper. For mood control  Dispense: 30 tablet; Refill: 2  Patient to follow-up in 2 months  [facility], PA 03/26/2021, 4:29 PM

## 2021-04-13 ENCOUNTER — Telehealth (HOSPITAL_COMMUNITY): Payer: Self-pay | Admitting: Physician Assistant

## 2021-04-13 NOTE — Telephone Encounter (Signed)
Vyvance 70 mg refill request per pt via phone [facility]. Pt ph [phone]

## 2021-04-14 ENCOUNTER — Other Ambulatory Visit (HOSPITAL_COMMUNITY): Payer: Self-pay | Admitting: Physician Assistant

## 2021-04-14 ENCOUNTER — Telehealth (HOSPITAL_COMMUNITY): Payer: Self-pay | Admitting: *Deleted

## 2021-04-14 DIAGNOSIS — F900 Attention-deficit hyperactivity disorder, predominantly inattentive type: Secondary | ICD-10-CM

## 2021-04-14 MED ORDER — LISDEXAMFETAMINE DIMESYLATE 70 MG PO CAPS: 70.0000 mg | ORAL_CAPSULE | ORAL | 0 refills | Status: DC

## 2021-04-14 NOTE — Telephone Encounter (Signed)
Provider was contacted by Suzanne K. Beck, RN regarding patient's medication refill. Patient's medication to be e-prescribed to pharmacy of choice.

## 2021-04-14 NOTE — Progress Notes (Signed)
Provider was contacted by Suzanne K. Beck, RN regarding patient's medication refill. Patient's medication to be e-prescribed to pharmacy of choice.

## 2021-04-14 NOTE — Telephone Encounter (Signed)
Call from patient requesting her Vyvanse be called in to her preferred pharmacy. Reviewed record and she should be out. Will reach to [facility], her provider for a new rx.

## 2021-05-14 ENCOUNTER — Other Ambulatory Visit (HOSPITAL_COMMUNITY): Payer: Self-pay | Admitting: Physician Assistant

## 2021-05-14 ENCOUNTER — Telehealth (HOSPITAL_COMMUNITY): Payer: Self-pay | Admitting: *Deleted

## 2021-05-14 DIAGNOSIS — F900 Attention-deficit hyperactivity disorder, predominantly inattentive type: Secondary | ICD-10-CM

## 2021-05-14 MED ORDER — LISDEXAMFETAMINE DIMESYLATE 70 MG PO CAPS: 70.0000 mg | ORAL_CAPSULE | ORAL | 0 refills | Status: DC

## 2021-05-14 NOTE — Telephone Encounter (Signed)
Call from patient seeking rx to be called in to her preferred pharmacy for her Vyvanse. Reviewed the [facility] and she should be out today. Will contact Eddie PA to call in needed Rx.

## 2021-05-14 NOTE — Telephone Encounter (Signed)
Provider was contacted by Suzanne K. Beck, RN regarding refill request for patient's Vyvanse.  Patient's medication to be e-prescribed to pharmacy of choice. 

## 2021-05-14 NOTE — Progress Notes (Signed)
Provider was contacted by Orpah Clinton. Reola Calkins, RN regarding refill request for patient's Vyvanse.  Patient's medication to be e-prescribed to pharmacy of choice.

## 2021-05-28 ENCOUNTER — Telehealth (INDEPENDENT_AMBULATORY_CARE_PROVIDER_SITE_OTHER): Payer: Medicaid Other | Admitting: Physician Assistant

## 2021-05-28 ENCOUNTER — Other Ambulatory Visit: Payer: Self-pay

## 2021-05-28 ENCOUNTER — Encounter (HOSPITAL_COMMUNITY): Payer: Self-pay | Admitting: Physician Assistant

## 2021-05-28 DIAGNOSIS — F411 Generalized anxiety disorder: Secondary | ICD-10-CM | POA: Insufficient documentation

## 2021-05-28 DIAGNOSIS — F3162 Bipolar disorder, current episode mixed, moderate: Secondary | ICD-10-CM | POA: Diagnosis not present

## 2021-05-28 DIAGNOSIS — F900 Attention-deficit hyperactivity disorder, predominantly inattentive type: Secondary | ICD-10-CM | POA: Diagnosis not present

## 2021-05-28 DIAGNOSIS — G4700 Insomnia, unspecified: Secondary | ICD-10-CM | POA: Diagnosis not present

## 2021-05-28 MED ORDER — TRAZODONE HCL 50 MG PO TABS: 50.0000 mg | ORAL_TABLET | Freq: Every evening | ORAL | 2 refills | Status: DC | PRN

## 2021-05-28 MED ORDER — DULOXETINE HCL 30 MG PO CPEP: 30.0000 mg | ORAL_CAPSULE | Freq: Every day | ORAL | 2 refills | Status: DC

## 2021-05-28 MED ORDER — LURASIDONE HCL 40 MG PO TABS: 40.0000 mg | ORAL_TABLET | Freq: Every day | ORAL | 2 refills | Status: DC

## 2021-05-28 MED ORDER — BUSPIRONE HCL 10 MG PO TABS: 10.0000 mg | ORAL_TABLET | Freq: Two times a day (BID) | ORAL | 2 refills | Status: DC

## 2021-05-28 NOTE — Progress Notes (Signed)
BH MD/PA/NP OP Progress Note  Virtual Visit via Video Note  I connected with Wanda Torres on 05/28/21 at  4:00 PM EDT by a video enabled telemedicine application and verified that I am speaking with the correct person using two identifiers.  Location: Patient: Home Provider: Clinic   I discussed the limitations of evaluation and management by telemedicine and the availability of in person appointments. The patient expressed understanding and agreed to proceed.  Follow Up Instructions:   I discussed the assessment and treatment plan with the patient. The patient was provided an opportunity to ask questions and all were answered. The patient agreed with the plan and demonstrated an understanding of the instructions.   The patient was advised to call back or seek an in-person evaluation if the symptoms worsen or if the condition fails to improve as anticipated.  I provided 21 minutes of non-face-to-face time during this encounter.  Meta Hatchet, PA   05/28/2021 10:29 PM AMAI CAPPIELLO  MRN:  [id number]  Chief Complaint: Follow-up and medication management  HPI:   Wanda Torres "Wanda Torres" is a 30 year old female with a past psychiatric history significant for generalized anxiety disorder, bipolar disorder, insomnia, and ADHD who presents to [facility] via virtual video visit for follow-up and medication management.  Patient is currently being managed on the following medications:  Duloxetine 30 mg daily Buspirone 10 mg 2 times daily Latuda 60 mg daily Vyvanse 70 mg daily Trazodone 50 mg at bedtime as needed  Patient states that Kasandra Knudsen has been helpful with the management of her alternating phases between depressive and manic states, however, patient is interested in going back down on her dosage.  Patient was originally taking Latuda 40 mg daily but since adjusting her dosage to 60 mg daily, she has experienced fatigue and  feelings of wonkiness that put her at of commission.  Patient would like to go back down to 40 mg daily on her Latuda.  Patient reports that her anxiety is being managed well through the use of her buspirone and duloxetine.  Patient states that her sleep has improved so much that she only needs trazodone as needed.  She reports no other issues or concerns regarding her current medication regimen.  Patient denies the need for dosage adjustments on her other medications and is requesting refills following the conclusion of the encounter.  A GAD-7 screen was performed with the patient scoring a 4.  Patient is pleasant, calm, cooperative, and fully engaged in conversation during the encounter.  Patient reports that she is feeling pretty chill.  Patient denies suicidal or homicidal ideations.  She further denies auditory or visual hallucinations and does not appear to be responding to internal/external stimuli.  Patient reports that recently her sleep has not been great due to being on prednisone.  Patient reports that she has just started experiencing a more balanced sleep cycle this week.  Patient endorses good appetite and eats on average 2 meals per day.  Patient endorses tobacco use sparingly and states that she has recently had 2 drinks.  Patient denies tobacco use but states that she engages in vaping and goes through a vape pod per day.  Patient denies illicit drug use.  Visit Diagnosis:    ICD-10-CM   1. Bipolar disorder, current episode mixed, moderate (HCC)  F31.62 busPIRone (BUSPAR) 10 MG tablet    DULoxetine (CYMBALTA) 30 MG capsule    lurasidone (LATUDA) 40 MG TABS tablet  2. Generalized anxiety disorder  F41.1 busPIRone (BUSPAR) 10 MG tablet    DULoxetine (CYMBALTA) 30 MG capsule  3. Insomnia, unspecified type  G47.00 traZODone (DESYREL) 50 MG tablet  4. Attention deficit hyperactivity disorder (ADHD), predominantly inattentive type  F90.0     Past Psychiatric History:  Bipolar  Disorder ADHD Insomnia Anxiety  Past Medical History:  Past Medical History:  Diagnosis Date  . Acne   . Allergy   . Anemia   . Anxiety   . Depression   . Psoriasis   . Supervision of other high risk pregnancy, antepartum 01/19/2019   Formatting of this note might be different from the original. Partner prefers She/Her pronouns   History reviewed. No pertinent surgical history.  Family Psychiatric History:  N/A  Family History:  Family History  Problem Relation Age of Onset  . Anxiety disorder Mother   . Depression Mother   . Sexual abuse Mother   . Drug abuse Father   . ADD / ADHD Sister   . Anxiety disorder Sister   . Bipolar disorder Sister   . Depression Sister   . Drug abuse Sister   . Sexual abuse Sister   . Anxiety disorder Brother   . Depression Brother   . Sexual abuse Brother   . Alcohol abuse Maternal Aunt   . Bipolar disorder Maternal Aunt   . Depression Maternal Aunt   . Sexual abuse Maternal Aunt   . ADD / ADHD Maternal Uncle   . Alcohol abuse Maternal Uncle   . Drug abuse Maternal Uncle   . Sexual abuse Maternal Uncle   . Alcohol abuse Maternal Grandfather   . Dementia Maternal Grandfather   . Depression Maternal Grandfather   . Sexual abuse Maternal Grandfather   . Depression Maternal Grandmother   . Sexual abuse Maternal Grandmother   . Bipolar disorder Maternal Aunt   . Depression Maternal Aunt   . Sexual abuse Maternal Aunt     Social History:  Social History   Socioeconomic History  . Marital status: Divorced    Spouse name: Not on file  . Number of children: Not on file  . Years of education: Not on file  . Highest education level: Not on file  Occupational History    Comment: Supervisor reservations; 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[employer]  Tobacco Use  . Smoking status: Never Smoker  . Smokeless tobacco: Never Used  . Tobacco comment: Vaps daily   Vaping Use  . Vaping Use: Every day  . Substances: Nicotine  Substance and Sexual  Activity  . Alcohol use: Yes    Alcohol/week: 1.0 standard drink    Types: 1 Standard drinks or equivalent per week    Comment: reports one a week  . Drug use: Never  . Sexual activity: Not on file  Other Topics Concern  . Not on file  Social History Narrative  . Not on file   Social Determinants of Health   Financial Resource Strain: Not on file  Food Insecurity: Not on file  Transportation Needs: Not on file  Physical Activity: Not on file  Stress: Not on file  Social Connections: Not on file    Allergies: No Known Allergies  Metabolic Disorder Labs: Lab Results  Component Value Date   HGBA1C 4.4 (L) 07/09/2020   MPG 79.58 07/09/2020   No results found for: PROLACTIN Lab Results  Component Value Date   CHOL 131 07/09/2020   TRIG 143 07/09/2020   HDL 59 07/09/2020  CHOLHDL 2.2 07/09/2020   VLDL 29 07/09/2020   LDLCALC 43 07/09/2020   Lab Results  Component Value Date   TSH 2.264 07/09/2020   TSH 2.210 05/01/2018    Therapeutic Level Labs: No results found for: LITHIUM No results found for: VALPROATE No components found for:  CBMZ  Current Medications: Current Outpatient Medications  Medication Sig Dispense Refill  . lurasidone (LATUDA) 40 MG TABS tablet Take 1 tablet (40 mg total) by mouth daily with breakfast. 30 tablet 2  . busPIRone (BUSPAR) 10 MG tablet Take 1 tablet (10 mg total) by mouth 2 (two) times daily. 60 tablet 2  . DULoxetine (CYMBALTA) 30 MG capsule Take 1 capsule (30 mg total) by mouth daily. For depression 30 capsule 2  . gabapentin (NEURONTIN) 300 MG capsule Take 1 capsule (300 mg total) by mouth 3 (three) times daily. 90 capsule 2  . hydrOXYzine (ATARAX/VISTARIL) 25 MG tablet Take 1 tablet (25 mg total) by mouth 3 (three) times daily as needed for anxiety. 90 tablet 1  . lisdexamfetamine (VYVANSE) 70 MG capsule Take 1 capsule (70 mg total) by mouth every morning. 30 capsule 0  . traZODone (DESYREL) 50 MG tablet Take 1 tablet (50 mg  total) by mouth at bedtime as needed for sleep. 30 tablet 2   No current facility-administered medications for this visit.     Musculoskeletal: Strength & Muscle Tone: Unable to assess due to telemedicine visit Gait & Station: Unable to assess due to telemedicine visit Patient leans: Unable to assess due to telemedicine visit  Psychiatric Specialty Exam: Review of Systems  Psychiatric/Behavioral: Negative for decreased concentration, dysphoric mood, hallucinations, self-injury, sleep disturbance and suicidal ideas. The patient is not nervous/anxious and is not hyperactive.     unknown if currently breastfeeding.There is no height or weight on file to calculate BMI.  General Appearance: Fairly Groomed  Eye Contact:  Good  Speech:  Clear and Coherent and Normal Rate  Volume:  Normal  Mood:  Euthymic  Affect:  Appropriate  Thought Process:  Coherent, Goal Directed and Descriptions of Associations: Intact  Orientation:  Full (Time, Place, and Person)  Thought Content: WDL   Suicidal Thoughts:  No  Homicidal Thoughts:  No  Memory:  Immediate;   Good Recent;   Good Remote;   Good  Judgement:  Good  Insight:  Good  Psychomotor Activity:  Normal  Concentration:  Concentration: Good and Attention Span: Good  Recall:  Good  Fund of Knowledge: Good  Language: Good  Akathisia:  NA  Handed:  Right  AIMS (if indicated): not done  Assets:  Communication Skills Desire for Improvement Financial Resources/Insurance Housing Vocational/Educational  ADL's:  Intact  Cognition: WNL  Sleep:  Good   Screenings: AIMS   Flowsheet Row [facility] (Dis[facility]  AIMS Total Score 0    AUDIT   Flowsheet Row [facility]) [facility] Admission (Discharged) from [facility]  Alcohol Use Disorder  Identification Test Final Score (AUDIT) 2 3    GAD-7   Flowsheet Row Video Visit from 05/28/2021 in [facility] Video Visit from 03/26/2021 in [facility] Video Visit from 02/11/2021 in [facility] Office Visit from 09/23/2016 in [facility] Care at [facility]  Total GAD-7 Score 4 8 4 17     PHQ2-9  Flowsheet Row [facility]  PHQ-2 Total Score 0 0 1 2 0  PHQ-9 Total Score -- -- -- 15 --    Flowsheet Row [facility]  C-SSRS RISK CATEGORY Moderate Risk Moderate Risk Moderate Risk       Assessment and Plan:   Wanda HarbourElizabeth A. Torres "Wanda Torres" is a 30 year old female with a past psychiatric history significant for generalized anxiety disorder, bipolar disorder, insomnia, and ADHD who [facility] via virtual video visit for follow-up and medication management.  Patient would like to adjust her dosage of Latuda from 60 mg daily to 40 mg daily due to the side effects she has experienced from a higher dose.  Patient reports no other issues or concerns regarding her other medications and denies the need for dosage adjustments at this time.  Patient is requesting refills on her medications following the conclusion of the encounter.  1. Bipolar disorder, current episode mixed, moderate (HCC)  - busPIRone (BUSPAR) 10 MG tablet; Take 1 tablet (10 mg total) by mouth 2  (two) times daily.  Dispense: 60 tablet; Refill: 2 - DULoxetine (CYMBALTA) 30 MG capsule; Take 1 capsule (30 mg total) by mouth daily. For depression  Dispense: 30 capsule; Refill: 2 - lurasidone (LATUDA) 40 MG TABS tablet; Take 1 tablet (40 mg total) by mouth daily with breakfast.  Dispense: 30 tablet; Refill: 2  2. Generalized anxiety disorder  - busPIRone (BUSPAR) 10 MG tablet; Take 1 tablet (10 mg total) by mouth 2 (two) times daily.  Dispense: 60 tablet; Refill: 2 - DULoxetine (CYMBALTA) 30 MG capsule; Take 1 capsule (30 mg total) by mouth daily. For depression  Dispense: 30 capsule; Refill: 2  3. Insomnia, unspecified type  - traZODone (DESYREL) 50 MG tablet; Take 1 tablet (50 mg total) by mouth at bedtime as needed for sleep.  Dispense: 30 tablet; Refill: 2  4. Attention deficit hyperactivity disorder (ADHD), predominantly inattentive type Patient to continue taking Vyvanse 70 mg daily for the management of her ADHD  Patient to follow-up in 3 months  Meta HatchetUchenna E Satoru Milich, PA 05/28/2021, 10:29 PM

## 2021-06-08 ENCOUNTER — Telehealth (HOSPITAL_COMMUNITY): Payer: Self-pay | Admitting: Physician Assistant

## 2021-06-08 ENCOUNTER — Telehealth (HOSPITAL_COMMUNITY): Payer: Self-pay | Admitting: *Deleted

## 2021-06-08 NOTE — Telephone Encounter (Signed)
Patient calls Requesting Refill lisdexamfetamine (VYVANSE) 70 MG capsule

## 2021-06-08 NOTE — Telephone Encounter (Signed)
Patient called requesting to speak to a nurse about a medication. Call [phone]

## 2021-06-08 NOTE — Telephone Encounter (Signed)
Called & LVM after receiving msg.Patient called requesting to speak to a nurse about a medication. Call [phone].

## 2021-06-09 ENCOUNTER — Other Ambulatory Visit (HOSPITAL_COMMUNITY): Payer: Self-pay | Admitting: Physician Assistant

## 2021-06-09 DIAGNOSIS — F900 Attention-deficit hyperactivity disorder, predominantly inattentive type: Secondary | ICD-10-CM

## 2021-06-09 MED ORDER — LISDEXAMFETAMINE DIMESYLATE 70 MG PO CAPS: 70.0000 mg | ORAL_CAPSULE | ORAL | 0 refills | Status: DC

## 2021-06-09 NOTE — Progress Notes (Signed)
Provider contacted by Direce E. McIntyre, RMA regarding medication refill. Patient's medication to be e-prescribed to pharmacy of choice.

## 2021-06-09 NOTE — Telephone Encounter (Signed)
Provider contacted by Rushie Chestnut, RMA regarding medication refill. Patient's medication to be e-prescribed to pharmacy of choice.

## 2021-07-06 ENCOUNTER — Telehealth (HOSPITAL_COMMUNITY): Payer: Self-pay | Admitting: Physician Assistant

## 2021-07-06 NOTE — Telephone Encounter (Signed)
Patient called for REFILL:  [facility] :  [id number] - [place], [place]  Patientt phone number: [phone] Last seen:  05/28/2021 COMMENTS:   LAST WRITTEN: 06/09/21

## 2021-07-07 ENCOUNTER — Other Ambulatory Visit (HOSPITAL_COMMUNITY): Payer: Self-pay | Admitting: Physician Assistant

## 2021-07-07 ENCOUNTER — Telehealth (HOSPITAL_COMMUNITY): Payer: BC Managed Care – PPO | Admitting: Physician Assistant

## 2021-07-07 DIAGNOSIS — F900 Attention-deficit hyperactivity disorder, predominantly inattentive type: Secondary | ICD-10-CM

## 2021-07-07 MED ORDER — LISDEXAMFETAMINE DIMESYLATE 70 MG PO CAPS: 70.0000 mg | ORAL_CAPSULE | ORAL | 0 refills | Status: DC

## 2021-07-07 NOTE — Progress Notes (Signed)
Provider was contacted by Lauralee Evener regarding refill request for patient's Vyvanse. Patient's medication to be e-prescribed to pharmacy of choice.

## 2021-07-07 NOTE — Telephone Encounter (Signed)
Provider was contacted by Carla Bracken regarding refill request for patient's Vyvanse. Patient's medication to be e-prescribed to pharmacy of choice. 

## 2021-07-18 IMAGING — CR DG TIBIA/FIBULA 2V*R*
3 series · 3 of 3 positions shown · non-contrast
Comparison: None.

CLINICAL DATA: Recent slip and fall with right leg pain, initial
encounter

EXAM:
RIGHT TIBIA AND FIBULA - 2 VIEW

[t tib-fib ap right (1 of 2)]
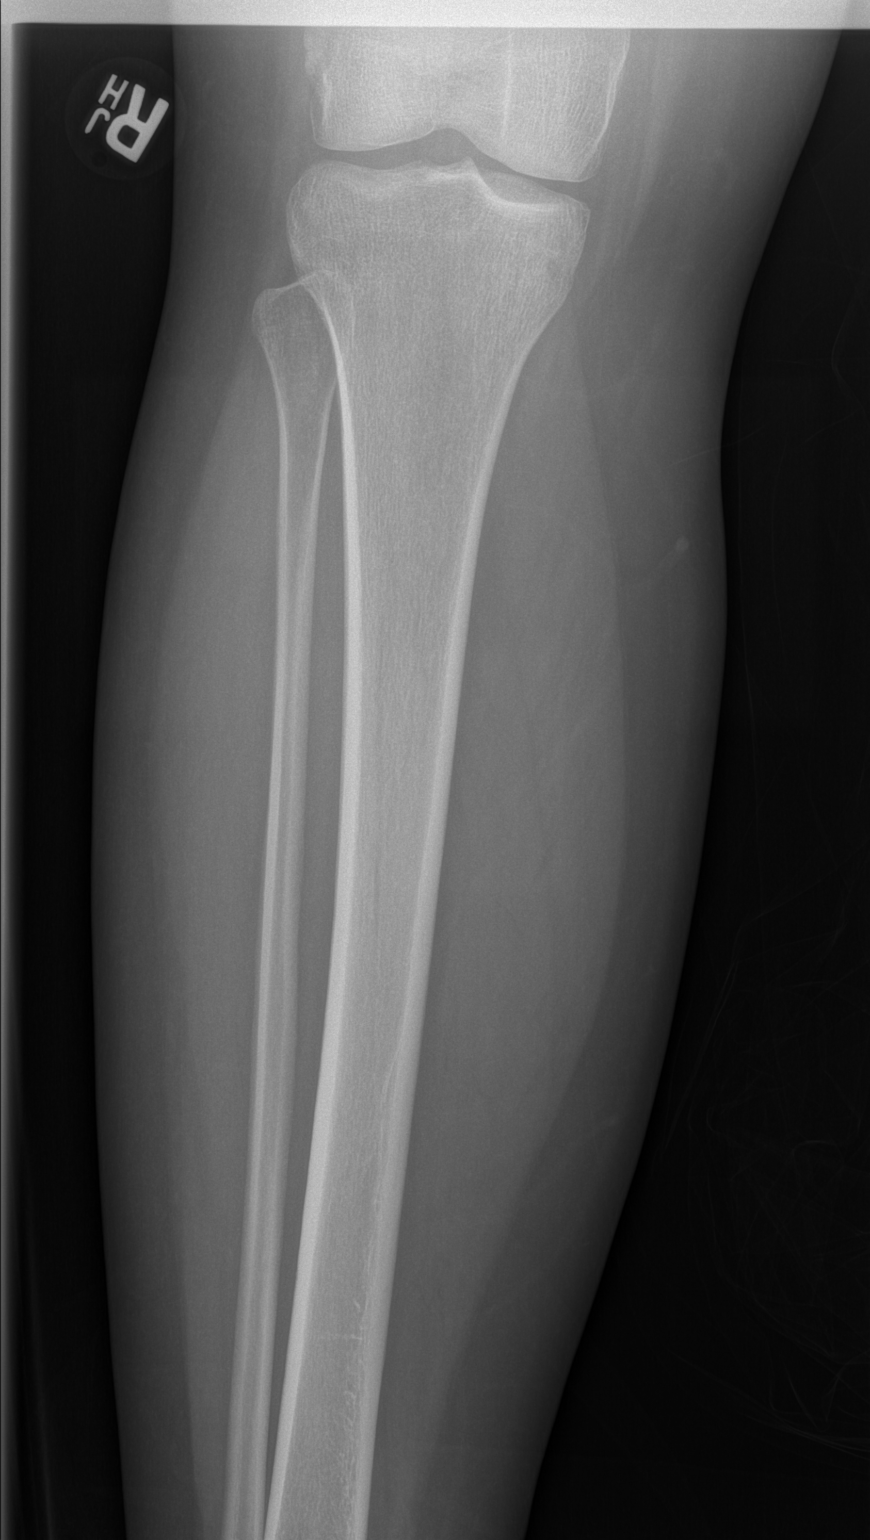

[t tib-fib ap right (2 of 2)]
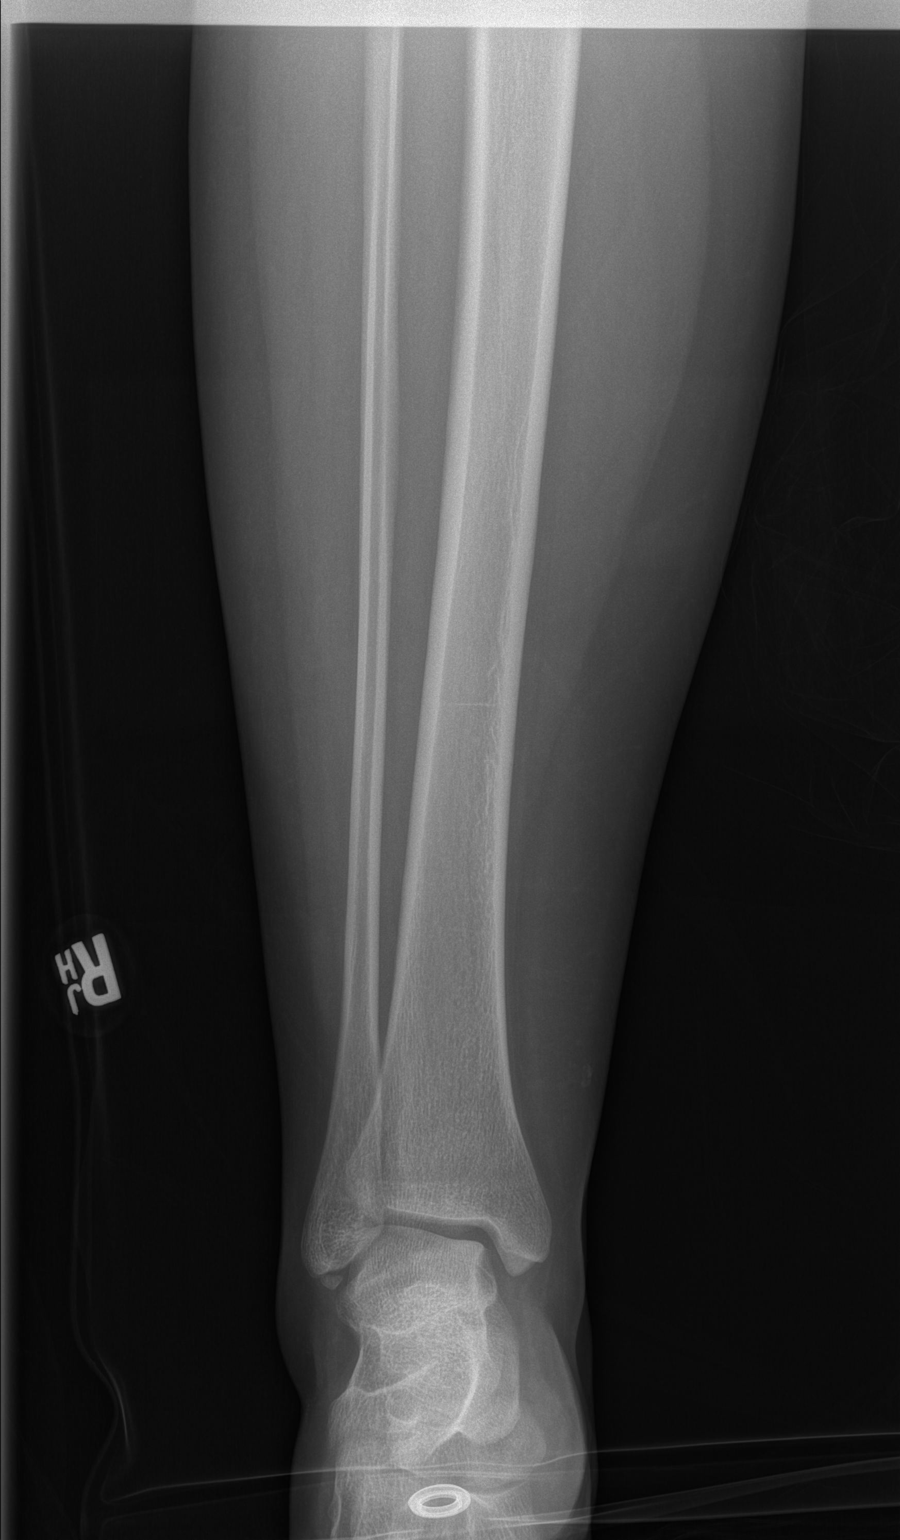

[x tib-fib lat right]
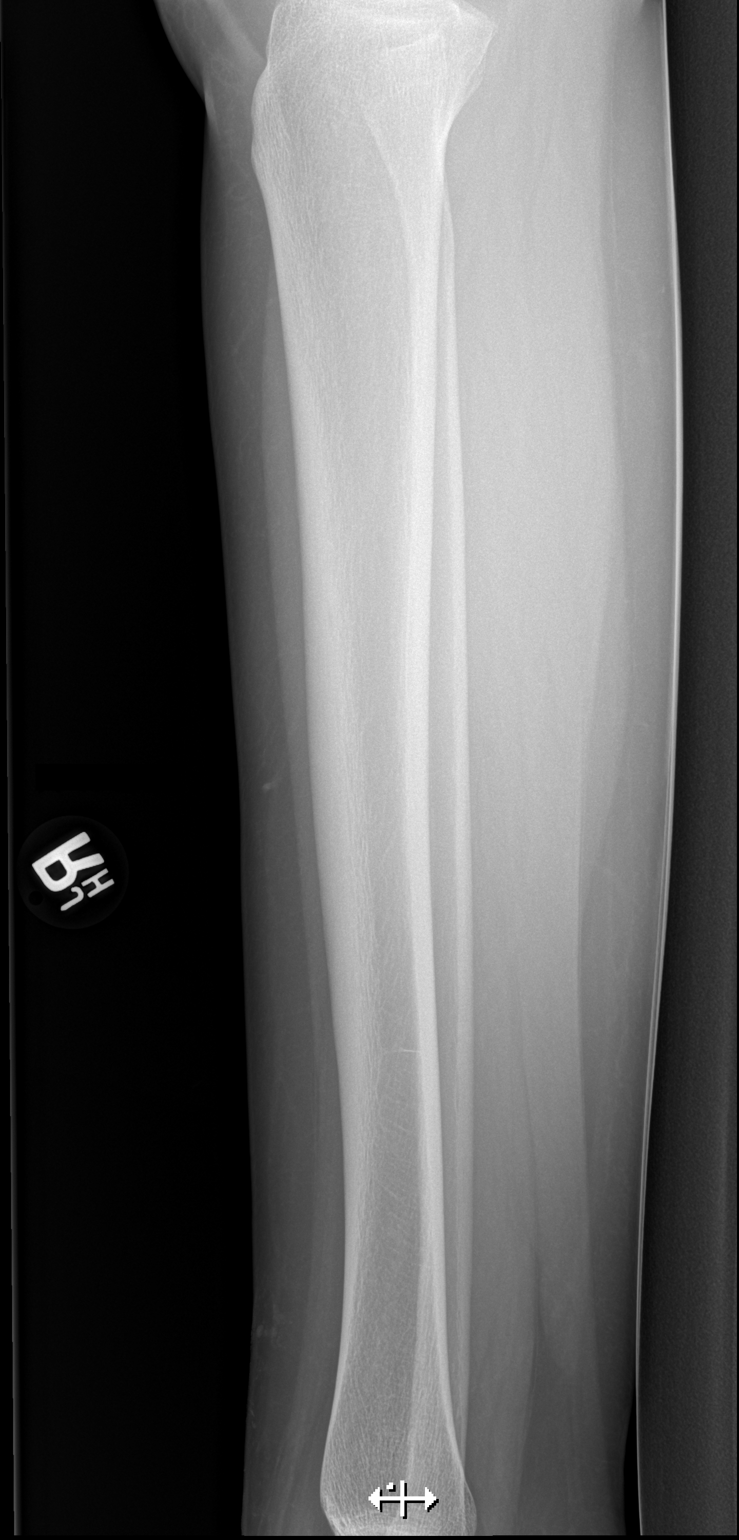

[3 of 3 positions shown; findings below may reference images not displayed]

FINDINGS: Well corticated bony density is noted adjacent to distal fibula
consistent with prior fracture and nonunion. No acute fracture is
seen. No soft tissue abnormality is noted.
IMPRESSION: Changes consistent with prior distal fibular fracture. No acute
abnormality noted.

## 2021-07-30 ENCOUNTER — Telehealth (HOSPITAL_COMMUNITY): Payer: Self-pay | Admitting: *Deleted

## 2021-07-30 NOTE — Telephone Encounter (Signed)
Rx Refill Request busPIRone (BUSPAR) 10 MG tablet

## 2021-07-31 ENCOUNTER — Other Ambulatory Visit (HOSPITAL_COMMUNITY): Payer: Self-pay | Admitting: Physician Assistant

## 2021-07-31 DIAGNOSIS — F411 Generalized anxiety disorder: Secondary | ICD-10-CM

## 2021-07-31 DIAGNOSIS — F3162 Bipolar disorder, current episode mixed, moderate: Secondary | ICD-10-CM

## 2021-07-31 MED ORDER — BUSPIRONE HCL 10 MG PO TABS: 10.0000 mg | ORAL_TABLET | Freq: Two times a day (BID) | ORAL | 2 refills | Status: DC

## 2021-07-31 NOTE — Progress Notes (Signed)
Provider was contacted by Wanda Torres regarding medication refill. Patient's medication to be e-prescribed to pharmacy of choice.

## 2021-07-31 NOTE — Telephone Encounter (Signed)
Provider was contacted by Direce E. McIntyre regarding medication refill. Patient's medication to be e-prescribed to pharmacy of choice.

## 2021-08-06 ENCOUNTER — Telehealth (HOSPITAL_COMMUNITY): Payer: Self-pay | Admitting: Physician Assistant

## 2021-08-06 NOTE — Telephone Encounter (Signed)
Patient called for REFILL:  VYVANCE 70 MG Pharmacy :  [facility] [address], [place] Patient phone number: [phone] Last seen:  05/28/21 COMMENTS:   LAST written 07/07/21 Next appt: 08/27/21

## 2021-08-07 ENCOUNTER — Other Ambulatory Visit (HOSPITAL_COMMUNITY): Payer: Self-pay | Admitting: Physician Assistant

## 2021-08-07 DIAGNOSIS — F900 Attention-deficit hyperactivity disorder, predominantly inattentive type: Secondary | ICD-10-CM

## 2021-08-07 MED ORDER — LISDEXAMFETAMINE DIMESYLATE 70 MG PO CAPS: 70.0000 mg | ORAL_CAPSULE | Freq: Every day | ORAL | 0 refills | Status: DC

## 2021-08-07 MED ORDER — LISDEXAMFETAMINE DIMESYLATE 70 MG PO CAPS: 70.0000 mg | ORAL_CAPSULE | ORAL | 0 refills | Status: DC

## 2021-08-07 NOTE — Telephone Encounter (Signed)
Provider was contacted by Carla Bracken regarding medication refill. Patient's medication to be e-prescribed to pharmacy of choice.

## 2021-08-07 NOTE — Progress Notes (Signed)
Provider was contacted by Wanda Torres regarding medication refill. Patient's medication to be e-prescribed to pharmacy of choice.

## 2021-08-27 ENCOUNTER — Encounter (HOSPITAL_COMMUNITY): Payer: Self-pay | Admitting: Physician Assistant

## 2021-08-27 ENCOUNTER — Other Ambulatory Visit: Payer: Self-pay

## 2021-08-27 ENCOUNTER — Telehealth (INDEPENDENT_AMBULATORY_CARE_PROVIDER_SITE_OTHER): Payer: Medicaid Other | Admitting: Physician Assistant

## 2021-08-27 DIAGNOSIS — F900 Attention-deficit hyperactivity disorder, predominantly inattentive type: Secondary | ICD-10-CM

## 2021-08-27 DIAGNOSIS — G4700 Insomnia, unspecified: Secondary | ICD-10-CM

## 2021-08-27 DIAGNOSIS — F3162 Bipolar disorder, current episode mixed, moderate: Secondary | ICD-10-CM

## 2021-08-27 DIAGNOSIS — F411 Generalized anxiety disorder: Secondary | ICD-10-CM

## 2021-08-27 MED ORDER — TRAZODONE HCL 50 MG PO TABS: 50.0000 mg | ORAL_TABLET | Freq: Every evening | ORAL | 2 refills | Status: DC | PRN

## 2021-08-27 MED ORDER — LAMOTRIGINE 25 MG PO TABS: 25.0000 mg | ORAL_TABLET | Freq: Every day | ORAL | 1 refills | Status: DC

## 2021-08-27 MED ORDER — BUSPIRONE HCL 10 MG PO TABS: 10.0000 mg | ORAL_TABLET | Freq: Two times a day (BID) | ORAL | 2 refills | Status: DC

## 2021-08-27 MED ORDER — DULOXETINE HCL 30 MG PO CPEP: 30.0000 mg | ORAL_CAPSULE | Freq: Every day | ORAL | 2 refills | Status: DC

## 2021-08-27 NOTE — Progress Notes (Signed)
BH MD/PA/NP OP Progress Note  Virtual Visit via Telephone Note  I connected with Wanda Torres on 08/27/21 at  4:00 PM EDT by telephone and verified that I am speaking with the correct person using two identifiers.  Location: Patient: Home Provider: Clinic   I discussed the limitations, risks, security and privacy concerns of performing an evaluation and management service by telephone and the availability of in person appointments. I also discussed with the patient that there may be a patient responsible charge related to this service. The patient expressed understanding and agreed to proceed.  Follow Up Instructions:   I discussed the assessment and treatment plan with the patient. The patient was provided an opportunity to ask questions and all were answered. The patient agreed with the plan and demonstrated an understanding of the instructions.   The patient was advised to call back or seek an in-person evaluation if the symptoms worsen or if the condition fails to improve as anticipated.  I provided 25 minutes of non-face-to-face time during this encounter.  Meta Hatchet, PA    08/27/2021 11:58 PM Wanda Torres  MRN:  [id number]  Chief Complaint: Follow up and medication management  HPI:     Visit Diagnosis:    ICD-10-CM   1. Attention deficit hyperactivity disorder (ADHD), predominantly inattentive type  F90.0     2. Bipolar disorder, current episode mixed, moderate (HCC)  F31.62 lamoTRIgine (LAMICTAL) 25 MG tablet    busPIRone (BUSPAR) 10 MG tablet    DULoxetine (CYMBALTA) 30 MG capsule    3. Generalized anxiety disorder  F41.1 busPIRone (BUSPAR) 10 MG tablet    DULoxetine (CYMBALTA) 30 MG capsule    4. Insomnia, unspecified type  G47.00 traZODone (DESYREL) 50 MG tablet      Past Psychiatric History:  Bipolar Disorder ADHD Insomnia Anxiety  Past Medical History:  Past Medical History:  Diagnosis Date   Acne    Allergy    Anemia    Anxiety     Depression    Psoriasis    Supervision of other high risk pregnancy, antepartum 01/19/2019   Formatting of this note might be different from the original. Partner prefers She/Her pronouns   History reviewed. No pertinent surgical history.  Family Psychiatric History:  N/A  Family History:  Family History  Problem Relation Age of Onset   Anxiety disorder Mother    Depression Mother    Sexual abuse Mother    Drug abuse Father    ADD / ADHD Sister    Anxiety disorder Sister    Bipolar disorder Sister    Depression Sister    Drug abuse Sister    Sexual abuse Sister    Anxiety disorder Brother    Depression Brother    Sexual abuse Brother    Alcohol abuse Maternal Aunt    Bipolar disorder Maternal Aunt    Depression Maternal Aunt    Sexual abuse Maternal Aunt    ADD / ADHD Maternal Uncle    Alcohol abuse Maternal Uncle    Drug abuse Maternal Uncle    Sexual abuse Maternal Uncle    Alcohol abuse Maternal Grandfather    Dementia Maternal Grandfather    Depression Maternal Grandfather    Sexual abuse Maternal Grandfather    Depression Maternal Grandmother    Sexual abuse Maternal Grandmother    Bipolar disorder Maternal Aunt    Depression Maternal Aunt    Sexual abuse Maternal Aunt     Social History:  Social History   Socioeconomic History   Marital status: Divorced    Spouse name: Not on file   Number of children: Not on file   Years of education: Not on file   Highest education level: Not on file  Occupational History    Comment: Supervisor reservations; 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[employer]  Tobacco Use   Smoking status: Never   Smokeless tobacco: Never   Tobacco comments:    Vaps daily   Vaping Use   Vaping Use: Every day   Substances: Nicotine  Substance and Sexual Activity   Alcohol use: Yes    Alcohol/week: 1.0 standard drink    Types: 1 Standard drinks or equivalent per week    Comment: reports one a week   Drug use: Never   Sexual activity: Not on file   Other Topics Concern   Not on file  Social History Narrative   Not on file   Social Determinants of Health   Financial Resource Strain: Not on file  Food Insecurity: Not on file  Transportation Needs: Not on file  Physical Activity: Not on file  Stress: Not on file  Social Connections: Not on file    Allergies: No Known Allergies  Metabolic Disorder Labs: Lab Results  Component Value Date   HGBA1C 4.4 (L) 07/09/2020   MPG 79.58 07/09/2020   No results found for: PROLACTIN Lab Results  Component Value Date   CHOL 131 07/09/2020   TRIG 143 07/09/2020   HDL 59 07/09/2020   CHOLHDL 2.2 07/09/2020   VLDL 29 07/09/2020   LDLCALC 43 07/09/2020   Lab Results  Component Value Date   TSH 2.264 07/09/2020   TSH 2.210 05/01/2018    Therapeutic Level Labs: No results found for: LITHIUM No results found for: VALPROATE No components found for:  CBMZ  Current Medications: Current Outpatient Medications  Medication Sig Dispense Refill   lamoTRIgine (LAMICTAL) 25 MG tablet Take 1 tablet (25 mg total) by mouth daily. 30 tablet 1   busPIRone (BUSPAR) 10 MG tablet Take 1 tablet (10 mg total) by mouth 2 (two) times daily. 60 tablet 2   DULoxetine (CYMBALTA) 30 MG capsule Take 1 capsule (30 mg total) by mouth daily. For depression 30 capsule 2   gabapentin (NEURONTIN) 300 MG capsule Take 1 capsule (300 mg total) by mouth 3 (three) times daily. 90 capsule 2   hydrOXYzine (ATARAX/VISTARIL) 25 MG tablet Take 1 tablet (25 mg total) by mouth 3 (three) times daily as needed for anxiety. 90 tablet 1   lisdexamfetamine (VYVANSE) 70 MG capsule Take 1 capsule (70 mg total) by mouth every morning. 30 capsule 0   [START ON 09/06/2021] lisdexamfetamine (VYVANSE) 70 MG capsule Take 1 capsule (70 mg total) by mouth daily. 30 capsule 0   lurasidone (LATUDA) 40 MG TABS tablet Take 1 tablet (40 mg total) by mouth daily with breakfast. 30 tablet 2   traZODone (DESYREL) 50 MG tablet Take 1 tablet (50 mg  total) by mouth at bedtime as needed for sleep. 30 tablet 2   No current facility-administered medications for this visit.     Musculoskeletal: Strength & Muscle Tone: Unable to assess due to telemedicine visit Gait & Station: Unable to assess due to telemedicine visit Patient leans: Unable to assess due to telemedicine visit  Psychiatric Specialty Exam: Review of Systems  Psychiatric/Behavioral:  Positive for sleep disturbance. Negative for decreased concentration, dysphoric mood, hallucinations, self-injury and suicidal ideas. The patient is nervous/anxious and is hyperactive.  unknown if currently breastfeeding.There is no height or weight on file to calculate BMI.  General Appearance: Unable to assess due to telemedicine visit  Eye Contact:  Unable to assess due to telemedicine visit  Speech:  Clear and Coherent and Normal Rate  Volume:  Normal  Mood:  Anxious and Euphoric  Affect:  Appropriate and Congruent  Thought Process:  Coherent, Goal Directed, and Descriptions of Associations: Intact  Orientation:  Full (Time, Place, and Person)  Thought Content: WDL   Suicidal Thoughts:  No  Homicidal Thoughts:  No  Memory:  Immediate;   Good Recent;   Good Remote;   Good  Judgement:  Good  Insight:  Good  Psychomotor Activity:  Normal  Concentration:  Concentration: Good and Attention Span: Good  Recall:  Good  Fund of Knowledge: Good  Language: Good  Akathisia:  NA  Handed:  Right  AIMS (if indicated): not done  Assets:  Communication Skills Desire for Improvement Financial Resources/Insurance Housing Vocational/Educational  ADL's:  Intact  Cognition: WNL  Sleep:  Poor   Screenings: AIMS    Flowsheet Row Ad[facility] (Discharged) [facility]  AIMS Total Score 0      AUDIT    Flowsheet Row [facility] Admission (Discharged)  from [facility]  Alcohol Use Disorder Identification Test Final Score (AUDIT) 2 3      GAD-7    Flowsheet Row [facility]  Total GAD-7 Score 15 4 8 4 17       PHQ2-9    Flowsheet Row [facility]  PHQ-2 Total Score 1 0 0 1 2  PHQ-9 Total Score -- -- -- -- 15      Flowsheet Row [facility]  C-SSRS RISK CATEGORY Moderate Risk Moderate Risk Moderate Risk        Assessment and Plan:     1. Bipolar disorder, current episode mixed, moderate (HCC)  - lamoTRIgine (LAMICTAL) 25 MG tablet; Take 1 tablet (25 mg total) by mouth daily.  Dispense: 30 tablet; Refill: 1 - busPIRone (BUSPAR) 10 MG tablet; Take 1 tablet (10 mg total) by mouth 2 (two) times daily.  Dispense: 60 tablet; Refill: 2 - DULoxetine (CYMBALTA) 30 MG capsule; Take 1 capsule (30 mg total) by mouth daily. For depression  Dispense: 30 capsule; Refill: 2  2. Generalized anxiety disorder  - busPIRone (BUSPAR) 10 MG tablet; Take 1 tablet (10 mg total) by mouth 2 (two) times daily.  Dispense: 60 tablet; Refill: 2 -  DULoxetine (CYMBALTA) 30 MG capsule; Take 1 capsule (30 mg total) by mouth daily. For depression  Dispense: 30 capsule; Refill: 2  3. Insomnia, unspecified  type  - traZODone (DESYREL) 50 MG tablet; Take 1 tablet (50 mg total) by mouth at bedtime as needed for sleep.  Dispense: 30 tablet; Refill: 2  4. Attention deficit hyperactivity disorder (ADHD), predominantly inattentive type   Patient to follow up in 7 weeks Provider spent a total of 25 minutes with the patient/reviewing patient's chart  Meta Hatchet, PA 08/27/2021, 11:58 PM

## 2021-09-07 ENCOUNTER — Encounter (HOSPITAL_COMMUNITY): Payer: Self-pay | Admitting: Psychiatry

## 2021-09-07 ENCOUNTER — Other Ambulatory Visit: Payer: Self-pay

## 2021-09-07 ENCOUNTER — Ambulatory Visit (INDEPENDENT_AMBULATORY_CARE_PROVIDER_SITE_OTHER): Payer: Medicaid Other | Admitting: Psychiatry

## 2021-09-07 VITALS — BP 131/98 | HR 111 | Ht 72.0 in | Wt 254.0 lb

## 2021-09-07 DIAGNOSIS — F3162 Bipolar disorder, current episode mixed, moderate: Secondary | ICD-10-CM | POA: Diagnosis not present

## 2021-09-07 MED ORDER — ARIPIPRAZOLE 5 MG PO TABS: 5.0000 mg | ORAL_TABLET | Freq: Every day | ORAL | 2 refills | Status: DC

## 2021-09-07 NOTE — Progress Notes (Signed)
BH MD/PA/NP OP Progress Note  09/07/2021 8:30 AM Wanda Torres  MRN:  [id number]  Chief Complaint: I stopped Lamictal because I was vomitting and had diarehha Chief Complaint   Stress    HPI: 30 year old female seen today for follow-up psychiatric evaluation.  She is a patient of Karel Jarvis who walked into the clinic today for medication management.  She has a psychiatric history of anxiety, depression, ADHD, bipolar disorder, and insomnia.  She is currently managed on Lamictal 25 mg daily, BuSpar 10 mg twice daily, Cymbalta 30 mg daily, hydroxyzine 25 mg 3 times daily as needed, Vyvanse 70 mg daily,  and trazodone 50 mg.  She notes her medications are somewhat effective in managing her psychiatric conditions.  Today she is well-groomed, pleasant, cooperative, engaged in conversation and maintained eye contact. She informed provider that since her last visit 2 weeks ago she has been vomiting.  She notes that her vomiting started after starting Lamictal.  She notes that she discontinued the medication 2 days ago and has been able to keep food and water down.  Patient notes that she feels like her mood is stable and denies symptoms of hypomania.  She notes that she is anxious because she would like to restart a mood stabilizer to prevent hospitalization.  Patient notes that while being on Lamictal her sleep was poor because she woke up vomiting nightly.  She notes that her chest would be red and flushed in the morning.  She denies symptoms of Stevens-Johnson syndrome.  Patient notes that while holding on Lamictal her sleep was poor.  She notes last night she did get 5 hours of sleep however notes that this was without trazodone.  Today patient is agreeable to discontinue Lamictal.  She will start Abilify 5 mg to help stabilize mood.  She will continue all other medications as prescribed.  She will follow-up with Mr.Nwoko on 10/18.  She will also restart trazodone as she notes that she has not been  sleeping well without it.  No other concerns noted at this time. Visit Diagnosis:    ICD-10-CM   1. Bipolar disorder, current episode mixed, moderate (HCC)  F31.62 ARIPiprazole (ABILIFY) 5 MG tablet      Past Psychiatric History:  anxiety, depression, ADHD, bipolar disorder, and insomnia  Past Medical History:  Past Medical History:  Diagnosis Date   Acne    Allergy    Anemia    Anxiety    Depression    Psoriasis    Supervision of other high risk pregnancy, antepartum 01/19/2019   Formatting of this note might be different from the original. Partner prefers She/Her pronouns   No past surgical history on file.  Family Psychiatric History: Mother anxiety/depression, father substance use, sister ADHD, anxiety, depression, maternal aunt bipolar disorder, alcohol use, depression, maternal uncle ADHD and substance use, maternal grandfather alcohol use and dementia, maternal aunt depression  Family History:  Family History  Problem Relation Age of Onset   Anxiety disorder Mother    Depression Mother    Sexual abuse Mother    Drug abuse Father    ADD / ADHD Sister    Anxiety disorder Sister    Bipolar disorder Sister    Depression Sister    Drug abuse Sister    Sexual abuse Sister    Anxiety disorder Brother    Depression Brother    Sexual abuse Brother    Alcohol abuse Maternal Aunt    Bipolar disorder Maternal Aunt  Depression Maternal Aunt    Sexual abuse Maternal Aunt    ADD / ADHD Maternal Uncle    Alcohol abuse Maternal Uncle    Drug abuse Maternal Uncle    Sexual abuse Maternal Uncle    Alcohol abuse Maternal Grandfather    Dementia Maternal Grandfather    Depression Maternal Grandfather    Sexual abuse Maternal Grandfather    Depression Maternal Grandmother    Sexual abuse Maternal Grandmother    Bipolar disorder Maternal Aunt    Depression Maternal Aunt    Sexual abuse Maternal Aunt     Social History:  Social History   Socioeconomic History   Marital  status: Divorced    Spouse name: Not on file   Number of children: Not on file   Years of education: Not on file   Highest education level: Not on file  Occupational History    Comment: Supervisor reservations; 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[employer]  Tobacco Use   Smoking status: Never   Smokeless tobacco: Never   Tobacco comments:    Vaps daily   Vaping Use   Vaping Use: Every day   Substances: Nicotine  Substance and Sexual Activity   Alcohol use: Yes    Alcohol/week: 1.0 standard drink    Types: 1 Standard drinks or equivalent per week    Comment: reports one a week   Drug use: Never   Sexual activity: Not on file  Other Topics Concern   Not on file  Social History Narrative   Not on file   Social Determinants of Health   Financial Resource Strain: Not on file  Food Insecurity: Not on file  Transportation Needs: Not on file  Physical Activity: Not on file  Stress: Not on file  Social Connections: Not on file    Allergies: No Known Allergies  Metabolic Disorder Labs: Lab Results  Component Value Date   HGBA1C 4.4 (L) 07/09/2020   MPG 79.58 07/09/2020   No results found for: PROLACTIN Lab Results  Component Value Date   CHOL 131 07/09/2020   TRIG 143 07/09/2020   HDL 59 07/09/2020   CHOLHDL 2.2 07/09/2020   VLDL 29 07/09/2020   LDLCALC 43 07/09/2020   Lab Results  Component Value Date   TSH 2.264 07/09/2020   TSH 2.210 05/01/2018    Therapeutic Level Labs: No results found for: LITHIUM No results found for: VALPROATE No components found for:  CBMZ  Current Medications: Current Outpatient Medications  Medication Sig Dispense Refill   ARIPiprazole (ABILIFY) 5 MG tablet Take 1 tablet (5 mg total) by mouth daily. 30 tablet 2   busPIRone (BUSPAR) 10 MG tablet Take 1 tablet (10 mg total) by mouth 2 (two) times daily. 60 tablet 2   DULoxetine (CYMBALTA) 30 MG capsule Take 1 capsule (30 mg total) by mouth daily. For depression 30 capsule 2   gabapentin  (NEURONTIN) 300 MG capsule Take 1 capsule (300 mg total) by mouth 3 (three) times daily. 90 capsule 2   hydrOXYzine (ATARAX/VISTARIL) 25 MG tablet Take 1 tablet (25 mg total) by mouth 3 (three) times daily as needed for anxiety. 90 tablet 1   lisdexamfetamine (VYVANSE) 70 MG capsule Take 1 capsule (70 mg total) by mouth every morning. 30 capsule 0   lisdexamfetamine (VYVANSE) 70 MG capsule Take 1 capsule (70 mg total) by mouth daily. 30 capsule 0   lurasidone (LATUDA) 40 MG TABS tablet Take 1 tablet (40 mg total) by mouth daily with breakfast. 30 tablet  2   traZODone (DESYREL) 50 MG tablet Take 1 tablet (50 mg total) by mouth at bedtime as needed for sleep. 30 tablet 2   lamoTRIgine (LAMICTAL) 25 MG tablet Take 1 tablet (25 mg total) by mouth daily. 30 tablet 1   No current facility-administered medications for this visit.     Musculoskeletal: Strength & Muscle Tone: within normal limits Gait & Station: normal Patient leans: N/A  Psychiatric Specialty Exam: Review of Systems  Blood pressure (!) 131/98, pulse (!) 111, height 6' (1.829 m), weight 254 lb (115.2 kg), unknown if currently breastfeeding.Body mass index is 34.45 kg/m.  General Appearance: Well Groomed  Eye Contact:  Good  Speech:  Clear and Coherent and Normal Rate  Volume:  Normal  Mood:  Euthymic and anxious about starting a new medication  Affect:  Appropriate and Congruent  Thought Process:  Coherent, Goal Directed, and Linear  Orientation:  Full (Time, Place, and Person)  Thought Content: WDL and Logical   Suicidal Thoughts:  No  Homicidal Thoughts:  No  Memory:  Immediate;   Good Recent;   Good Remote;   Good  Judgement:  Good  Insight:  Good  Psychomotor Activity:  Normal  Concentration:  Concentration: Good and Attention Span: Good  Recall:  Good  Fund of Knowledge: Good  Language: Good  Akathisia:  No  Handed:  Right  AIMS (if indicated): not done  Assets:  Communication Skills Desire for  Improvement Financial Resources/Insurance Housing Leisure Time Physical Health Social Support  ADL's:  Intact  Cognition: WNL  Sleep:  Fair   Screenings: AIMS    Flowsheet Row Admission (Discharged) [facility]  AIMS Total Score 0      AUDIT    Flowsheet Row Ad[facility] ([facility]) [facility] Admission (Discharged) from [facility]  Alcohol Use Disorder Identification Test Final Score (AUDIT) 2 3      GAD-7    Flowsheet Row [facility] from [facility]20[facility]  Total GAD-7 Score 15 4 8 4 17       PHQ2-9    Flowsheet Row [facility]20[facility] from [facility]  PHQ-2 Total Score 1 0 0 1 2  PHQ-9 Total Score -- -- -- -- 15      Flowsheet Row [facility]20[facility]  C-SSRS RISK CATEGORY Moderate Risk Moderate Risk Moderate Risk        Assessment and Plan: Patient notes that her mood is stable however reports while on Lamictal she was unable to stop vomiting.  Today she is agreeable to discontinue Lamictal.  She will  start Abilify 5 mg to help manage mood.  She will also restart trazodone to help manage sleep.  Medications  not refilled today as patient was just seen 2 weeks ago by her provider.  1. Bipolar disorder, current episode mixed, moderate (HCC)  Start- ARIPiprazole (ABILIFY) 5 MG tablet; Take 1 tablet (5 mg total) by mouth daily.  Dispense: 30 tablet; Refill: 2  Follow-up with Mr. Karel Jarvis  Shanna Cisco, NP 09/07/2021, 8:30 AM

## 2021-09-20 ENCOUNTER — Other Ambulatory Visit (HOSPITAL_COMMUNITY): Payer: Self-pay | Admitting: Physician Assistant

## 2021-09-20 DIAGNOSIS — F3162 Bipolar disorder, current episode mixed, moderate: Secondary | ICD-10-CM

## 2021-09-28 ENCOUNTER — Ambulatory Visit (HOSPITAL_COMMUNITY)
Admission: EM | Admit: 2021-09-28 | Discharge: 2021-09-28 | Disposition: A | Discharge status: Home / Self Care | Payer: Medicaid Other | Attending: Family | Admitting: Family

## 2021-09-28 ENCOUNTER — Other Ambulatory Visit: Payer: Self-pay

## 2021-09-28 DIAGNOSIS — Z79899 Other long term (current) drug therapy: Secondary | ICD-10-CM | POA: Insufficient documentation

## 2021-09-28 DIAGNOSIS — G2581 Restless legs syndrome: Secondary | ICD-10-CM | POA: Insufficient documentation

## 2021-09-28 DIAGNOSIS — F419 Anxiety disorder, unspecified: Secondary | ICD-10-CM | POA: Insufficient documentation

## 2021-09-28 DIAGNOSIS — F410 Panic disorder [episodic paroxysmal anxiety] without agoraphobia: Secondary | ICD-10-CM | POA: Insufficient documentation

## 2021-09-28 DIAGNOSIS — G4700 Insomnia, unspecified: Secondary | ICD-10-CM | POA: Insufficient documentation

## 2021-09-28 DIAGNOSIS — F3160 Bipolar disorder, current episode mixed, unspecified: Secondary | ICD-10-CM | POA: Insufficient documentation

## 2021-09-28 MED ORDER — HYDROXYZINE HCL 25 MG PO TABS: 50.0000 mg | ORAL_TABLET | Freq: Three times a day (TID) | ORAL | 0 refills | Status: DC | PRN

## 2021-09-28 MED ORDER — HYDROXYZINE HCL 25 MG PO TABS
25.0000 mg | ORAL_TABLET | Freq: Three times a day (TID) | ORAL | 0 refills | Status: DC | PRN
Start: 2021-09-28 — End: 2021-09-28

## 2021-09-28 MED ORDER — PROPRANOLOL HCL 10 MG PO TABS: 10.0000 mg | ORAL_TABLET | Freq: Two times a day (BID) | ORAL | 0 refills | Status: DC | PRN

## 2021-09-28 NOTE — Discharge Instructions (Signed)
Take all medications as prescribed. Keep all follow-up appointments as scheduled.  Do not consume alcohol or use illegal drugs while on prescription medications. Report any adverse effects from your medications to your primary care provider promptly.  In the event of recurrent symptoms or worsening symptoms, call 911, a crisis hotline, or go to the nearest emergency department for evaluation.   

## 2021-09-28 NOTE — ED Provider Notes (Addendum)
Behavioral Health Urgent Care Medical Screening Exam  Patient Name: Wanda Torres MRN: [id number] Date of Evaluation: 09/28/21 Chief Complaint: Worsening anxiety Diagnosis:  Final diagnoses:  Mixed bipolar I disorder (HCC)    History of Present illness: Wanda Torres is a 30 y.o. female patient presents to [facility] urgent care reporting worsening anxiety and a small panic attack today at work.  States she noticed that her anxiety has increased over the past couple of weeks.  States she is followed by [facility].  Reports she is prescribed Cymbalta 30 mg , Abilify 5 mg, trazodone 25 mg  and Vyvanse 70 mg   Brihana states Abilify 5 mg was recently added to her regimen.  States she started experiencing restless leg so she started taking Abilify at night however she continues to feel fidgety.   Patient reports that the last medication that was added to her medication regimen.  Discussed discontinuing Abilify.  May increase hydroxyzine 25 mg to 50 mg as needed.  Patient was receptive to plan.  Patient encouraged to follow-up with primary care provider for mental health.  She was receptive to plan.  She is denying suicidal or homicidal ideations.  Denies auditory or visual hallucinations.  Denied illicit drug use or substance abuse history.  We will make prescription for hydroxyzine 50 mg available.  Support, encouragement and reassurance was provided.   Wanda Torres, 30 y.o., female patient seen face to face by this provider, consulted with Dr. Lucianne Muss; and chart reviewed on 09/28/21.  On evaluation Wanda Torres reports  During evaluation Wanda Torres is sitting  in no acute distress. She is alert/oriented x 4; calm/cooperative; and mood congruent with affect.  She is speaking in a clear tone at moderate volume, and normal pace; with good eye contact. Her thought process is coherent and relevant; There is no indication that she is currently responding to internal/external  stimuli or experiencing delusional thought content; and she has denied suicidal/self-harm/homicidal ideation, psychosis, and paranoia.   Patient has remained calm throughout assessment and has answered questions appropriately.     At this time Wanda Torres is educated and verbalizes understanding of mental health resources and other crisis services in the community. She is instructed to call 911 and present to the nearest emergency room should she experience any suicidal/homicidal ideation, auditory/visual/hallucinations, or detrimental worsening of her mental health condition. She was a also advised by Clinical research associate that she could call the toll-free phone on insurance card to assist with identifying in network counselors and agencies or number on back of Medicaid card to speak with care coordinator.     Psychiatric Specialty Exam  Presentation  General Appearance:Appropriate for Environment Eye Contact:Good Speech:Clear and Coherent Speech Volume:Normal Handedness:Right  Mood and Affect  Mood: Anxious; Depressed Affect: Congruent  Thought Process  Thought Processes: Goal Directed; Coherent Descriptions of Associations:Intact Orientation:Full (Time, Place and Person) Thought Content:Logical   Hallucinations:None Ideas of Reference:None Suicidal Thoughts:No Homicidal Thoughts:No  Sensorium  Memory: Immediate Good; Recent Good; Remote Good Judgment: Good Insight: Fair  Art therapist  Concentration: Fair Attention Span: Good Recall: Good Fund of Knowledge: Good Language: Good  Psychomotor Activity  Psychomotor Activity: Normal; Restlessness  Assets  Assets: Housing  Sleep  Sleep: Fair Number of hours:  No data recorded  Nutritional Assessment (For OBS and FBC admissions only) Has the patient had a weight loss or gain of 10 pounds or more in the last 3 months?: No Has the patient had a decrease  in food intake/or appetite?: No Does the patient have  dental problems?: No Does the patient have eating habits or behaviors that may be indicators of an eating disorder including binging or inducing vomiting?: No Has the patient recently lost weight without trying?: 0 Has the patient been eating poorly because of a decreased appetite?: 0 Malnutrition Screening Tool Score: 0   Physical Exam: Physical Exam Vitals and nursing note reviewed.  Cardiovascular:     Rate and Rhythm: Normal rate and regular rhythm.  Pulmonary:     Effort: Pulmonary effort is normal.     Breath sounds: Normal breath sounds.  Neurological:     Mental Status: She is oriented to person, place, and time.   Review of Systems  HENT: Negative.    Cardiovascular: Negative.   Gastrointestinal: Negative.   Psychiatric/Behavioral:  The patient is nervous/anxious.   All other systems reviewed and are negative. Blood pressure 116/87, pulse (!) 127, resp. rate 16, SpO2 97 %, unknown if currently breastfeeding. There is no height or weight on file to calculate BMI.  Musculoskeletal: Strength & Muscle Tone: within normal limits Gait & Station: normal Patient leans: N/A   [facility] MSE Discharge Disposition for Follow up and Recommendations: Based on my evaluation the patient does not appear to have an emergency medical condition and can be discharged with resources and follow up care in outpatient services for Medication Management  Called in Inderal 10 mg po PRN BID to [facility] pharmacy - patient to increase hydroxyzine 25 mg to 50 mg PRN   - Discontinue Abilify 5 mg daily   Wanda Rack, NP 09/28/2021, 4:08 PM

## 2021-09-28 NOTE — ED Notes (Signed)
Discharge instructions provided and Pt stated understanding. Pt alert, orient and ambulatory prior to d/c from facility. Pt escorted to the front lobby to d/c from facility. Safety maintained.   

## 2021-09-28 NOTE — Progress Notes (Signed)
Patient is a 30 year old female that presents this date with excessive anxiety after having a panic attack at her place of employment earlier today. Patient cannot identify any immediate stressor associated with that event. Patient has been diagnosed with Bipolar disorder and ADHD. Patient is receiving OP services from [facility] NP who assists with medication management. Patient is currently prescribed Cymbalta, Abilify, Trazodone and Vyvanse. Patient reports current medication compliance although is not involved in counseling at this time. Patient denies any current SA issues.  Patient  states this date she would "just like to talk to somebody about current issues and medication management."

## 2021-09-30 ENCOUNTER — Telehealth (HOSPITAL_COMMUNITY): Payer: Self-pay | Admitting: *Deleted

## 2021-09-30 NOTE — Telephone Encounter (Signed)
Call from patient to the [facility] office and Elam sent over a chat to inform me she is reporting the Abilify is giving her restless legs and would like to speak with the provider.

## 2021-10-07 ENCOUNTER — Encounter (HOSPITAL_COMMUNITY): Payer: Self-pay | Admitting: Physician Assistant

## 2021-10-07 ENCOUNTER — Ambulatory Visit (INDEPENDENT_AMBULATORY_CARE_PROVIDER_SITE_OTHER): Payer: Medicaid Other | Admitting: Physician Assistant

## 2021-10-07 ENCOUNTER — Other Ambulatory Visit: Payer: Self-pay

## 2021-10-07 VITALS — BP 127/89 | HR 125 | Ht 72.0 in | Wt 257.0 lb

## 2021-10-07 DIAGNOSIS — F411 Generalized anxiety disorder: Secondary | ICD-10-CM

## 2021-10-07 DIAGNOSIS — F3162 Bipolar disorder, current episode mixed, moderate: Secondary | ICD-10-CM

## 2021-10-07 DIAGNOSIS — F900 Attention-deficit hyperactivity disorder, predominantly inattentive type: Secondary | ICD-10-CM | POA: Diagnosis not present

## 2021-10-07 DIAGNOSIS — G4700 Insomnia, unspecified: Secondary | ICD-10-CM

## 2021-10-07 MED ORDER — LURASIDONE HCL 40 MG PO TABS: 40.0000 mg | ORAL_TABLET | Freq: Every day | ORAL | 1 refills | Status: DC

## 2021-10-07 MED ORDER — LURASIDONE HCL 20 MG PO TABS: 20.0000 mg | ORAL_TABLET | Freq: Every day | ORAL | 0 refills | Status: DC

## 2021-10-07 NOTE — Progress Notes (Signed)
BH MD/PA/NP OP Progress Note  10/07/2021 9:14 AM Wanda Torres  MRN:  [id number]  Chief Complaint:  Chief Complaint   medications     HPI:   Wanda Torres "Wanda Torres" is a 30 year old female with a past psychiatric history significant for generalized anxiety disorder, insomnia, bipolar disorder, and attention deficit hyperactivity disorder who presents to [facility] for follow-up and medication management.  Patient is currently being managed on the following medications:  Lamictal 25 mg daily for BuSpar 10 mg 3 times daily Duloxetine 30 mg daily Trazodone 50 mg at bedtime  Patient has not been taking Lamictal due to experiencing episodes of vomiting and diarrhea while taking the medication.  Patient was seen by Dr. Doyne Keel on 09/07/2021 for medication management due to experiencing adverse effects from her Lamictal use.  Patient was placed on Abilify 5 mg daily for the management of her bipolar disorder.  Patient was later on seen at [facility] due to worsening anxiety.  In addition to experiencing worsening anxiety, patient also informed the provider that she was also experiencing restless leg.  She endorsed that she did not start experiencing worsening anxiety or restless leg until she was prescribed Abilify 5 mg daily.  Patient tried taking her Abilify 5 mg daily at night but still experienced feeling fidgety.  Upon discharge from [facility] patient was taken off Abilify 5 mg daily and placed on Inderal 10 mg 2 times daily as needed for the management of her anxiety.  Patient was also informed to increase her hydroxyzine dosage from 25 to 50 mg as needed.  Today, patient reports that she is feeling manic and has not slept for a bit.  She also states that she has spent $400 in the past 2 days and is unable to accurately recall what she spent her money on.  She expresses making unsafe choices characterized by going on a [employer]  date without that in the individual and getting a random piercing out of the blue.  Patient denies experiencing depressive symptoms but states that she has been highly irritable over the past few days.  Patient reports that she has not been working since she experienced 3 panic attacks while on the job and as a result, left the workplace.  A GAD-7 screen was performed with the patient scoring a 9.  Patient is alert and oriented x4, euphoric, cooperative, and fully engaged in conversation during the encounter.  Patient endorses great mood and rates her mood a 7 out of 10.  Patient denies suicidal or homicidal ideations.  She further denies auditory or visual hallucinations and does not appear to be responding to internal/external stimuli.  Patient endorses poor sleep stating that she has not slept in the past 2 days.  Patient endorses limited appetite stating that she manages to have at least a couple yogurt per day.  Patient's alcohol consumption has been varied but states that she was drinking a lot the past week.  Patient denies tobacco use but states that she engages in vaping.  Patient states that 2 vape pods will last her roughly 3 days.  Patient endorses illicit drug use in the form of marijuana.  Visit Diagnosis:    ICD-10-CM   1. Generalized anxiety disorder  F41.1 busPIRone (BUSPAR) 10 MG tablet    DULoxetine (CYMBALTA) 30 MG capsule    2. Insomnia, unspecified type  G47.00 traZODone (DESYREL) 50 MG tablet    3. Bipolar disorder, current episode  mixed, moderate (HCC)  F31.62 lurasidone (LATUDA) 20 MG TABS tablet    lurasidone (LATUDA) 40 MG TABS tablet    busPIRone (BUSPAR) 10 MG tablet    DULoxetine (CYMBALTA) 30 MG capsule    4. Attention deficit hyperactivity disorder (ADHD), predominantly inattentive type  F90.0 lisdexamfetamine (VYVANSE) 70 MG capsule    lisdexamfetamine (VYVANSE) 70 MG capsule      Past Psychiatric History:  Bipolar Disorder ADHD Insomnia Anxiety  Past  Medical History:  Past Medical History:  Diagnosis Date   Acne    Allergy    Anemia    Anxiety    Depression    Psoriasis    Supervision of other high risk pregnancy, antepartum 01/19/2019   Formatting of this note might be different from the original. Partner prefers She/Her pronouns   History reviewed. No pertinent surgical history.  Family Psychiatric History:  N/A  Family History:  Family History  Problem Relation Age of Onset   Anxiety disorder Mother    Depression Mother    Sexual abuse Mother    Drug abuse Father    ADD / ADHD Sister    Anxiety disorder Sister    Bipolar disorder Sister    Depression Sister    Drug abuse Sister    Sexual abuse Sister    Anxiety disorder Brother    Depression Brother    Sexual abuse Brother    Alcohol abuse Maternal Aunt    Bipolar disorder Maternal Aunt    Depression Maternal Aunt    Sexual abuse Maternal Aunt    ADD / ADHD Maternal Uncle    Alcohol abuse Maternal Uncle    Drug abuse Maternal Uncle    Sexual abuse Maternal Uncle    Alcohol abuse Maternal Grandfather    Dementia Maternal Grandfather    Depression Maternal Grandfather    Sexual abuse Maternal Grandfather    Depression Maternal Grandmother    Sexual abuse Maternal Grandmother    Bipolar disorder Maternal Aunt    Depression Maternal Aunt    Sexual abuse Maternal Aunt     Social History:  Social History   Socioeconomic History   Marital status: Divorced    Spouse name: Not on file   Number of children: Not on file   Years of education: Not on file   Highest education level: Not on file  Occupational History    Comment: Supervisor reservations; 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[employer]  Tobacco Use   Smoking status: Never   Smokeless tobacco: Never   Tobacco comments:    Vaps daily   Vaping Use   Vaping Use: Every day   Substances: Nicotine  Substance and Sexual Activity   Alcohol use: Yes    Alcohol/week: 1.0 standard drink    Types: 1 Standard drinks or  equivalent per week    Comment: reports one a week   Drug use: Never   Sexual activity: Not on file  Other Topics Concern   Not on file  Social History Narrative   Not on file   Social Determinants of Health   Financial Resource Strain: Not on file  Food Insecurity: Not on file  Transportation Needs: Not on file  Physical Activity: Not on file  Stress: Not on file  Social Connections: Not on file    Allergies: No Known Allergies  Metabolic Disorder Labs: Lab Results  Component Value Date   HGBA1C 4.4 (L) 07/09/2020   MPG 79.58 07/09/2020   No results found for: PROLACTIN Lab  Results  Component Value Date   CHOL 131 07/09/2020   TRIG 143 07/09/2020   HDL 59 07/09/2020   CHOLHDL 2.2 07/09/2020   VLDL 29 07/09/2020   LDLCALC 43 07/09/2020   Lab Results  Component Value Date   TSH 2.264 07/09/2020   TSH 2.210 05/01/2018    Therapeutic Level Labs: No results found for: LITHIUM No results found for: VALPROATE No components found for:  CBMZ  Current Medications: Current Outpatient Medications  Medication Sig Dispense Refill   lurasidone (LATUDA) 20 MG TABS tablet Take 1 tablet (20 mg total) by mouth daily. Patient to take 1 tablet (20 mg total) for 6 days followed by 2 tablets (40 mg total) daily. Take with food. 30 tablet 0   lurasidone (LATUDA) 40 MG TABS tablet Take 1 tablet (40 mg total) by mouth daily with breakfast. 30 tablet 1   busPIRone (BUSPAR) 10 MG tablet Take 1 tablet (10 mg total) by mouth 2 (two) times daily. 60 tablet 2   DULoxetine (CYMBALTA) 30 MG capsule Take 1 capsule (30 mg total) by mouth daily. For depression 30 capsule 2   hydrOXYzine (ATARAX/VISTARIL) 25 MG tablet Take 2 tablets (50 mg total) by mouth 3 (three) times daily as needed for anxiety. 30 tablet 0   lisdexamfetamine (VYVANSE) 70 MG capsule Take 1 capsule (70 mg total) by mouth every morning. 30 capsule 0   [START ON 11/09/2021] lisdexamfetamine (VYVANSE) 70 MG capsule Take 1 capsule  (70 mg total) by mouth daily. 30 capsule 0   propranolol (INDERAL) 10 MG tablet Take 1 tablet (10 mg total) by mouth 2 (two) times daily as needed. 15 tablet 0   traZODone (DESYREL) 50 MG tablet Take 1 tablet (50 mg total) by mouth at bedtime as needed for sleep. 30 tablet 2   No current facility-administered medications for this visit.     Musculoskeletal: Strength & Muscle Tone: within normal limits Gait & Station: normal Patient leans: N/A  Psychiatric Specialty Exam: Review of Systems  Psychiatric/Behavioral:  Positive for sleep disturbance. Negative for decreased concentration, dysphoric mood, hallucinations, self-injury and suicidal ideas. The patient is nervous/anxious and is hyperactive.    Blood pressure 127/89, pulse (!) 125, height 6' (1.829 m), weight 257 lb (116.6 kg), SpO2 97 %, unknown if currently breastfeeding.Body mass index is 34.86 kg/m.  General Appearance: Well Groomed  Eye Contact:  Good  Speech:  Clear and Coherent and Normal Rate  Volume:  Normal  Mood:  Anxious, Euphoric, and Euthymic  Affect:  Appropriate, Congruent, and Full Range  Thought Process:  Coherent, Goal Directed, and Descriptions of Associations: Intact  Orientation:  Full (Time, Place, and Person)  Thought Content: WDL   Suicidal Thoughts:  No  Homicidal Thoughts:  No  Memory:  Immediate;   Good Recent;   Good Remote;   Good  Judgement:  Fair  Insight:  Fair  Psychomotor Activity:  Increased and Restlessness  Concentration:  Concentration: Fair and Attention Span: Fair  Recall:  Good  Fund of Knowledge: Good  Language: Good  Akathisia:  NA  Handed:  Right  AIMS (if indicated): not done  Assets:  Communication Skills Desire for Improvement Financial Resources/Insurance Housing Vocational/Educational  ADL's:  Intact  Cognition: WNL  Sleep:  Poor   Screenings: AIMS    Flowsheet Row [facility] (Dis[facility]  AIMS Total Score 0      AUDIT    Flowsheet  Row [facility]) [facility] Admission (Discharged) from [facility]  Alcohol Use Disorder Identification Test Final Score (AUDIT) 2 3      GAD-7    Flowsheet Row Clinical Support from 10/07/2021 in [facility] Video Visit from 08/27/2021 in [facility] Video Visit from 05/28/2021 in [facility] Video Visit from 03/26/2021 in [facility] Video Visit from 02/11/2021 in [facility]  Total GAD-7 Score 9 15 4 8 4       PHQ2-9    Flowsheet Row Clinical Support from 10/07/2021 in [facility] Video Visit from 08/27/2021 in [facility] Video Visit from 05/28/2021 in [facility] Video Visit from 03/26/2021 in [facility] Video Visit from 02/11/2021 in [facility]  PHQ-2 Total Score 1 1 0 0 1      Flowsheet Row Clinical Support from 10/07/2021 in [facility] Video Visit from 08/27/2021 in [facility] Video Visit from 05/28/2021 in [facility]  C-SSRS RISK CATEGORY Low Risk Moderate Risk Moderate Risk        Assessment and Plan:   [facility] "Wanda Frommelt" is a 30 year old female with a past psychiatric history significant for generalized anxiety disorder, insomnia, bipolar disorder, and attention deficit hyperactivity disorder who presents to [facility] for follow-up and medication management.  Patient presents to the encounter feeling euphoric.  Patient was recently taking Lamictal but had to discontinue the medication  due to experiencing vomiting and diarrhea.  She was placed on Abilify shortly after but experienced worsening anxiety while on the medication.  Patient was encouraged to take Inderal 10 mg 2 times daily for the management of her anxiety.  It appears patient is not taking Inderal prescription.  Patient expressed that she liked being manic due to feeling good and being able to get so many activities done.  Provider informed patient that she should be placed back on her bipolar disorder medication so that she can prevent from burning out during her manic phase.  After some encouraging, patient agreed to being placed back on lurasidone.  Patient to be placed on lurasidone 20 mg for 6 days then 40 mg daily for the management of her manic symptoms related to bipolar disorder.  Patient to also receive a prescription for her other medications.  Patient's medications to be e- prescribed to pharmacy of choice.  1. Generalized anxiety disorder  - busPIRone (BUSPAR) 10 MG tablet; Take 1 tablet (10 mg total) by mouth 2 (two) times daily.  Dispense: 60 tablet; Refill: 2 - DULoxetine (CYMBALTA) 30 MG capsule; Take 1 capsule (30 mg total) by mouth daily. For depression  Dispense: 30 capsule; Refill: 2  2. Insomnia, unspecified type  - traZODone (DESYREL) 50 MG tablet; Take 1 tablet (50 mg total) by mouth at bedtime as needed for sleep.  Dispense: 30 tablet; Refill: 2  3. Bipolar disorder, current episode mixed, moderate (HCC)  - lurasidone (LATUDA) 20 MG TABS tablet; Take 1 tablet (20 mg total) by mouth daily. Patient to take 1 tablet (20 mg total) for 6 days followed by 2 tablets (40 mg total) daily. Take with food.  Dispense: 30 tablet; Refill: 0 - lurasidone (LATUDA) 40 MG  TABS tablet; Take 1 tablet (40 mg total) by mouth daily with breakfast.  Dispense: 30 tablet; Refill: 1 - busPIRone (BUSPAR) 10 MG tablet; Take 1 tablet (10 mg total) by mouth 2 (two) times daily.  Dispense: 60 tablet; Refill: 2 - DULoxetine  (CYMBALTA) 30 MG capsule; Take 1 capsule (30 mg total) by mouth daily. For depression  Dispense: 30 capsule; Refill: 2  4. Attention deficit hyperactivity disorder (ADHD), predominantly inattentive type  - lisdexamfetamine (VYVANSE) 70 MG capsule; Take 1 capsule (70 mg total) by mouth every morning.  Dispense: 30 capsule; Refill: 0 - lisdexamfetamine (VYVANSE) 70 MG capsule; Take 1 capsule (70 mg total) by mouth daily.  Dispense: 30 capsule; Refill: 0  Patient to follow up on 10/13/2021 Provider spent a total of 29 minutes with the patient/reviewing patient's chart  Meta Hatchet, PA 10/07/2021, 9:14 AM

## 2021-10-08 ENCOUNTER — Encounter (HOSPITAL_COMMUNITY): Payer: Self-pay | Admitting: Physician Assistant

## 2021-10-08 MED ORDER — LISDEXAMFETAMINE DIMESYLATE 70 MG PO CAPS: 70.0000 mg | ORAL_CAPSULE | ORAL | 0 refills | Status: DC

## 2021-10-08 MED ORDER — BUSPIRONE HCL 10 MG PO TABS: 10.0000 mg | ORAL_TABLET | Freq: Two times a day (BID) | ORAL | 2 refills | Status: DC

## 2021-10-08 MED ORDER — LISDEXAMFETAMINE DIMESYLATE 70 MG PO CAPS: 70.0000 mg | ORAL_CAPSULE | Freq: Every day | ORAL | 0 refills | Status: DC

## 2021-10-08 MED ORDER — TRAZODONE HCL 50 MG PO TABS: 50.0000 mg | ORAL_TABLET | Freq: Every evening | ORAL | 2 refills | Status: DC | PRN

## 2021-10-08 MED ORDER — DULOXETINE HCL 30 MG PO CPEP: 30.0000 mg | ORAL_CAPSULE | Freq: Every day | ORAL | 2 refills | Status: DC

## 2021-10-13 ENCOUNTER — Encounter (HOSPITAL_COMMUNITY): Payer: Self-pay | Admitting: Physician Assistant

## 2021-10-13 ENCOUNTER — Telehealth (INDEPENDENT_AMBULATORY_CARE_PROVIDER_SITE_OTHER): Payer: Medicaid Other | Admitting: Physician Assistant

## 2021-10-13 ENCOUNTER — Other Ambulatory Visit: Payer: Self-pay

## 2021-10-13 DIAGNOSIS — F411 Generalized anxiety disorder: Secondary | ICD-10-CM | POA: Diagnosis not present

## 2021-10-13 DIAGNOSIS — F3162 Bipolar disorder, current episode mixed, moderate: Secondary | ICD-10-CM

## 2021-10-13 DIAGNOSIS — G4700 Insomnia, unspecified: Secondary | ICD-10-CM

## 2021-10-13 DIAGNOSIS — F900 Attention-deficit hyperactivity disorder, predominantly inattentive type: Secondary | ICD-10-CM

## 2021-10-13 MED ORDER — LURASIDONE HCL 60 MG PO TABS: 60.0000 mg | ORAL_TABLET | Freq: Every day | ORAL | 1 refills | Status: DC

## 2021-10-13 NOTE — Progress Notes (Addendum)
BH MD/PA/NP OP Progress Note  Virtual Visit via Video Note  I connected with Wanda Torres on 10/13/21 at  4:30 PM EDT by a video enabled telemedicine application and verified that I am speaking with the correct person using two identifiers.  Location: Patient: Home Provider: Clinic   I discussed the limitations of evaluation and management by telemedicine and the availability of in person appointments. The patient expressed understanding and agreed to proceed.  Follow Up Instructions:  I discussed the assessment and treatment plan with the patient. The patient was provided an opportunity to ask questions and all were answered. The patient agreed with the plan and demonstrated an understanding of the instructions.   The patient was advised to call back or seek an in-person evaluation if the symptoms worsen or if the condition fails to improve as anticipated.  I provided 31 minutes of non-face-to-face time during this encounter.  Meta Hatchet, PA   10/13/2021 9:26 PM TATIANNA IBBOTSON  MRN:  [id number]  Chief Complaint: Follow up and medication management  HPI:   Wanda Torres "[name] Debnam" is a 30 year old female with a past psychiatric history significant for attention deficit hyperactivity disorder, bipolar disorder, generalized anxiety disorder, and insomnia who presents to [facility] via virtual video visit for follow-up and medication management.  Patient is currently being managed on the following medications:  Buspirone 10 mg 2 times daily Duloxetine 30 mg daily Lurasidone 40 mg daily Vyvanse 70 mg daily  Though patient did not take her medications the first night, she reports that she has been taking her medications as prescribed since then.  Patient reports that her lurasidone 40 mg daily has been helpful and she has not noted any adverse side effects from taking the medication.  Patient reports that she feels okay  and that her medications help her with sleep at night.  Patient states that she continues to have trouble with focusing, decreased mood, and lack of motivation.  Patient reports that her anxiety is fine and manageable and that she has not been at work yet.  Patient denies experiencing any other issues at this time.  A GAD-7 screen was performed with the patient scoring a 3.  A PHQ-9 screen was performed with the patient scoring a 2.  Patient is alert and oriented x4, calm, cooperative, and fully engaged in conversation during the encounter.  Patient reports that she is feeling crummy today due to not feeling manic.  Patient denies suicidal or homicidal ideation.  She further denies auditory or visual hallucinations and does not appear to be bonding to internal/external stimuli.  Patient endorses good sleep and receives on average 6 to 7 hours of sleep each night.  Patient endorses decreased appetite and eats on average 1 meal per day.  Patient endorses alcohol consumption occasionally.  Patient denies tobacco use but states that she does engage in vaping.  Patient denies illicit drug use.  Visit Diagnosis:    ICD-10-CM   1. Attention deficit hyperactivity disorder (ADHD), predominantly inattentive type  F90.0     2. Bipolar disorder, current episode mixed, moderate (HCC)  F31.62 lurasidone 60 MG TABS    3. Generalized anxiety disorder  F41.1     4. Insomnia, unspecified type  G47.00       Past Psychiatric History:  Bipolar Disorder ADHD Insomnia Anxiety  Past Medical History:  Past Medical History:  Diagnosis Date   Acne    Allergy  Anemia    Anxiety    Depression    Psoriasis    Supervision of other high risk pregnancy, antepartum 01/19/2019   Formatting of this note might be different from the original. Partner prefers She/Her pronouns   History reviewed. No pertinent surgical history.  Family Psychiatric History:  N/A  Family History:  Family History  Problem Relation Age  of Onset   Anxiety disorder Mother    Depression Mother    Sexual abuse Mother    Drug abuse Father    ADD / ADHD Sister    Anxiety disorder Sister    Bipolar disorder Sister    Depression Sister    Drug abuse Sister    Sexual abuse Sister    Anxiety disorder Brother    Depression Brother    Sexual abuse Brother    Alcohol abuse Maternal Aunt    Bipolar disorder Maternal Aunt    Depression Maternal Aunt    Sexual abuse Maternal Aunt    ADD / ADHD Maternal Uncle    Alcohol abuse Maternal Uncle    Drug abuse Maternal Uncle    Sexual abuse Maternal Uncle    Alcohol abuse Maternal Grandfather    Dementia Maternal Grandfather    Depression Maternal Grandfather    Sexual abuse Maternal Grandfather    Depression Maternal Grandmother    Sexual abuse Maternal Grandmother    Bipolar disorder Maternal Aunt    Depression Maternal Aunt    Sexual abuse Maternal Aunt     Social History:  Social History   Socioeconomic History   Marital status: Divorced    Spouse name: Not on file   Number of children: Not on file   Years of education: Not on file   Highest education level: Not on file  Occupational History    Comment: Supervisor reservations; 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[employer]  Tobacco Use   Smoking status: Never   Smokeless tobacco: Never   Tobacco comments:    Vaps daily   Vaping Use   Vaping Use: Every day   Substances: Nicotine  Substance and Sexual Activity   Alcohol use: Yes    Alcohol/week: 1.0 standard drink    Types: 1 Standard drinks or equivalent per week    Comment: reports one a week   Drug use: Never   Sexual activity: Not on file  Other Topics Concern   Not on file  Social History Narrative   Not on file   Social Determinants of Health   Financial Resource Strain: Not on file  Food Insecurity: Not on file  Transportation Needs: Not on file  Physical Activity: Not on file  Stress: Not on file  Social Connections: Not on file    Allergies: No Known  Allergies  Metabolic Disorder Labs: Lab Results  Component Value Date   HGBA1C 4.4 (L) 07/09/2020   MPG 79.58 07/09/2020   No results found for: PROLACTIN Lab Results  Component Value Date   CHOL 131 07/09/2020   TRIG 143 07/09/2020   HDL 59 07/09/2020   CHOLHDL 2.2 07/09/2020   VLDL 29 07/09/2020   LDLCALC 43 07/09/2020   Lab Results  Component Value Date   TSH 2.264 07/09/2020   TSH 2.210 05/01/2018    Therapeutic Level Labs: No results found for: LITHIUM No results found for: VALPROATE No components found for:  CBMZ  Current Medications: Current Outpatient Medications  Medication Sig Dispense Refill   busPIRone (BUSPAR) 10 MG tablet Take 1 tablet (10 mg  total) by mouth 2 (two) times daily. 60 tablet 2   DULoxetine (CYMBALTA) 30 MG capsule Take 1 capsule (30 mg total) by mouth daily. For depression 30 capsule 2   hydrOXYzine (ATARAX/VISTARIL) 25 MG tablet Take 2 tablets (50 mg total) by mouth 3 (three) times daily as needed for anxiety. 30 tablet 0   lisdexamfetamine (VYVANSE) 70 MG capsule Take 1 capsule (70 mg total) by mouth every morning. 30 capsule 0   [START ON 11/09/2021] lisdexamfetamine (VYVANSE) 70 MG capsule Take 1 capsule (70 mg total) by mouth daily. 30 capsule 0   lurasidone 60 MG TABS Take 1 tablet (60 mg total) by mouth daily with breakfast. 30 tablet 1   propranolol (INDERAL) 10 MG tablet Take 1 tablet (10 mg total) by mouth 2 (two) times daily as needed. 15 tablet 0   traZODone (DESYREL) 50 MG tablet Take 1 tablet (50 mg total) by mouth at bedtime as needed for sleep. 30 tablet 2   No current facility-administered medications for this visit.     Musculoskeletal: Strength & Muscle Tone: Unable to assess due to telemedicine visit Gait & Station: Unable to assess due to telemedicine visit Patient leans: Unable to assess due to telemedicine visit  Psychiatric Specialty Exam: Review of Systems  Psychiatric/Behavioral:  Positive for decreased  concentration. Negative for dysphoric mood, hallucinations, self-injury, sleep disturbance and suicidal ideas. The patient is not nervous/anxious and is not hyperactive.    unknown if currently breastfeeding.There is no height or weight on file to calculate BMI.  General Appearance: Well Groomed  Eye Contact:  Good  Speech:  Clear and Coherent and Normal Rate  Volume:  Normal  Mood:  Euthymic  Affect:  Appropriate  Thought Process:  Coherent, Goal Directed, and Descriptions of Associations: Intact  Orientation:  Full (Time, Place, and Person)  Thought Content: WDL   Suicidal Thoughts:  No  Homicidal Thoughts:  No  Memory:  Immediate;   Good Recent;   Good Remote;   Good  Judgement:  Good  Insight:  Fair  Psychomotor Activity:  Normal  Concentration:  Concentration: Good and Attention Span: Good  Recall:  Good  Fund of Knowledge: Good  Language: Good  Akathisia:  NA  Handed:  Right  AIMS (if indicated): not done  Assets:  Communication Skills  ADL's:  Intact  Cognition: WNL  Sleep:  Good   Screenings: AIMS    Flowsheet Row [facility] (Discharged) [facility]  AIMS Total Score 0      AUDIT    Flowsheet Row [facility]) [facility] Admission (Discharged) from [facility]  Alcohol Use Disorder Identification Test Final Score (AUDIT) 2 3      GAD-7    Flowsheet Row Video Visit from 10/13/2021 in [facility] Clinical Support from 10/07/2021 in [facility] Video Visit from 08/27/2021 in [facility] Video Visit from 05/28/2021 in [facility] Video Visit from 03/26/2021 in [facility]  Total GAD-7 Score 3 9 15 4 8       PHQ2-9    Flowsheet Row Video  Visit from 10/13/2021 in [facility] Clinical Support from 10/07/2021 in [facility] Video Visit from 08/27/2021 in [facility] Video Visit from 05/28/2021 in  [facility] Video Visit from 03/26/2021 in [facility]  PHQ-2 Total Score 2 1 1  0 0  PHQ-9 Total Score 6 -- -- -- --      Flowsheet Row Video Visit from 10/13/2021 in [facility] Clinical Support from 10/07/2021 in [facility] Video Visit from 08/27/2021 in [facility]  C-SSRS RISK CATEGORY Low Risk Low Risk Moderate Risk        Assessment and Plan:   [facility] "[name] Sheller" is a 30 year old female with a past psychiatric history significant for attention deficit hyperactivity disorder, bipolar disorder, generalized anxiety disorder, and insomnia who presents to [facility] via virtual video visit for follow-up and medication management.  Patient reports that she has been taking her medications as prescribed and she has not noted any adverse side effects especially from her lurasidone.  Patient denies overt manic symptoms but states that she has been dealing with some depressive symptoms.  Provider recommended adjusting her dosage of lurasidone from 40 mg to 60 mg daily.  Patient was agreeable to recommendation.  Patient's medication to be e- prescribed to pharmacy of choice.  1. Bipolar disorder, current episode mixed, moderate (HCC) Patient to continue taking duloxetine 30 mg daily for the management of her bipolar disorder  - lurasidone 60 MG TABS; Take 1 tablet (60 mg total) by mouth daily with breakfast.  Dispense: 30 tablet; Refill: 1  2. Attention deficit hyperactivity disorder (ADHD), predominantly inattentive type Patient to continue taking Vyvanse 70 mg  daily for the management of her attention deficit hyperactivity disorder  3. Generalized anxiety disorder Patient to continue taking buspirone 10 mg 2 times daily for the management of her generalized anxiety disorder Patient to continue taking duloxetine 30 mg daily for the management of her generalized anxiety disorder  4. Insomnia, unspecified type Patient to continue taking trazodone 50 mg at bedtime for the management of her insomnia  Patient to follow up in 4 weeks Provider spent a total of 31 minutes with the patient/reviewing patient's chart  [facility], PA 10/13/2021, 9:26 PM

## 2021-11-18 ENCOUNTER — Telehealth (INDEPENDENT_AMBULATORY_CARE_PROVIDER_SITE_OTHER): Payer: Medicaid Other | Admitting: Physician Assistant

## 2021-11-18 ENCOUNTER — Encounter (HOSPITAL_COMMUNITY): Payer: Self-pay | Admitting: Physician Assistant

## 2021-11-18 DIAGNOSIS — F411 Generalized anxiety disorder: Secondary | ICD-10-CM

## 2021-11-18 DIAGNOSIS — F3162 Bipolar disorder, current episode mixed, moderate: Secondary | ICD-10-CM

## 2021-11-18 DIAGNOSIS — F900 Attention-deficit hyperactivity disorder, predominantly inattentive type: Secondary | ICD-10-CM

## 2021-11-18 MED ORDER — BUSPIRONE HCL 10 MG PO TABS: 10.0000 mg | ORAL_TABLET | Freq: Two times a day (BID) | ORAL | 2 refills | Status: DC

## 2021-11-18 MED ORDER — DULOXETINE HCL 30 MG PO CPEP: 30.0000 mg | ORAL_CAPSULE | Freq: Every day | ORAL | 2 refills | Status: DC

## 2021-11-18 MED ORDER — LISDEXAMFETAMINE DIMESYLATE 70 MG PO CAPS: 70.0000 mg | ORAL_CAPSULE | ORAL | 0 refills | Status: DC

## 2021-11-18 MED ORDER — LISDEXAMFETAMINE DIMESYLATE 70 MG PO CAPS: 70.0000 mg | ORAL_CAPSULE | Freq: Every day | ORAL | 0 refills | Status: DC

## 2021-11-18 MED ORDER — LURASIDONE HCL 60 MG PO TABS: 60.0000 mg | ORAL_TABLET | Freq: Every day | ORAL | 1 refills | Status: DC

## 2021-11-18 NOTE — Progress Notes (Signed)
BH MD/PA/NP OP Progress Note  Virtual Visit via Telephone Note  I connected with Austin Miles Brickey on 11/18/21 at 10:00 AM EST by telephone and verified that I am speaking with the correct person using two identifiers.  Location: Patient: Private Provider: Clinic   I discussed the limitations, risks, security and privacy concerns of performing an evaluation and management service by telephone and the availability of in person appointments. I also discussed with the patient that there may be a patient responsible charge related to this service. The patient expressed understanding and agreed to proceed.  Follow Up Instructions:  I discussed the assessment and treatment plan with the patient. The patient was provided an opportunity to ask questions and all were answered. The patient agreed with the plan and demonstrated an understanding of the instructions.   The patient was advised to call back or seek an in-person evaluation if the symptoms worsen or if the condition fails to improve as anticipated.  I provided 23 minutes of non-face-to-face time during this encounter.  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  Patient is currently being managed on the following medications:  Duloxetine 30 mg daily Lurasidone 60 mg daily Vyvanse 70 mg daily Buspirone 10 mg 2 times daily Trazodone 50 mg at bedtime  Patient reports that she has been dealing with a lot of stressors currently.  Patient attributes the majority of her stress  to upcoming plans. She states that she was compliant with taking her medications for 2-1/2 weeks but stopped taking them for 5 days.  She states that she has now been on no meds for 3 days and feels that they have been helpful.  Despite going a brief period without taking her medications, patient states that she has been taking Latuda 60 mg daily.  Patient endorses depression that she states is circumstantial.  Patient plans on moving in with her aunt and states that living with her aunt will encourage her to be more accountable with taking her medications.  Patient denies experiencing anxiety and further denies panic attacks.  She reports that she slept roughly 3 days in a row due to her bouts of depression.  Patient's current stressors include custody and mediation appointments and currently figuring out her short-term disability.  A PHQ-9 screen was performed with the patient scoring a 23.  A GAD-7 screen was also performed with the patient scoring a 10.  A [place] Suicide Severity Rating Scale was performed with the patient being considered moderate risk.  Patient denies being a danger to herself and is able to contract for safety.  Patient is alert and oriented x4, calm, cooperative, and fully engaged in conversation during the encounter.  Patient endorses all right mood.  Patient denies suicidal or homicidal ideations.  She further denies auditory or visual hallucinations and does not appear to be responding to internal/external stimuli.  Patient endorses good sleep and states that she slept roughly 8 hours the previous night.  Patient endorses decreased appetite stating that she is eating 1 meal at least every night.  Patient endorses alcohol consumption.  Patient denies  tobacco use but states that she engages in vaping (1/2 pod a day).  Patient denies illicit drug use.  Visit Diagnosis:    ICD-10-CM   1. Generalized anxiety disorder  F41.1 busPIRone (BUSPAR) 10 MG tablet    DULoxetine (CYMBALTA) 30  MG capsule    2. Bipolar disorder, current episode mixed, moderate (HCC)  F31.62 busPIRone (BUSPAR) 10 MG tablet    DULoxetine (CYMBALTA) 30 MG capsule    Lurasidone HCl 60 MG TABS    3. Attention deficit hyperactivity disorder (ADHD), predominantly inattentive type  F90.0 lisdexamfetamine (VYVANSE) 70 MG capsule    lisdexamfetamine (VYVANSE) 70 MG capsule      Past Psychiatric History:  Bipolar Disorder ADHD Insomnia Generalized anxiety disorder  Past Medical History:  Past Medical History:  Diagnosis Date   Acne    Allergy    Anemia    Anxiety    Depression    Psoriasis    Supervision of other high risk pregnancy, antepartum 01/19/2019   Formatting of this note might be different from the original. Partner prefers She/Her pronouns   No past surgical history on file.  Family Psychiatric History:  N/A  Family History:  Family History  Problem Relation Age of Onset   Anxiety disorder Mother    Depression Mother    Sexual abuse Mother    Drug abuse Father    ADD / ADHD Sister    Anxiety disorder Sister    Bipolar disorder Sister    Depression Sister    Drug abuse Sister    Sexual abuse Sister    Anxiety disorder Brother    Depression Brother    Sexual abuse Brother    Alcohol abuse Maternal Aunt    Bipolar disorder Maternal Aunt    Depression Maternal Aunt    Sexual abuse Maternal Aunt    ADD / ADHD Maternal Uncle    Alcohol abuse Maternal Uncle    Drug abuse Maternal Uncle    Sexual abuse Maternal Uncle    Alcohol abuse Maternal Grandfather    Dementia Maternal Grandfather    Depression Maternal Grandfather    Sexual abuse Maternal Grandfather    Depression Maternal Grandmother    Sexual abuse Maternal Grandmother    Bipolar disorder Maternal Aunt    Depression Maternal Aunt    Sexual abuse Maternal Aunt     Social History:  Social History   Socioeconomic History   Marital status: Divorced    Spouse name: Not on file   Number of children: Not on  file   Years of education: Not on file   Highest education level: Not on file  Occupational History    Comment: Supervisor reservations; 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[employer]  Tobacco Use   Smoking status: Never   Smokeless tobacco: Never   Tobacco comments:    Vaps daily   Vaping Use   Vaping Use: Every day   Substances: Nicotine  Substance and Sexual Activity   Alcohol use: Yes    Alcohol/week: 1.0 standard drink    Types: 1 Standard drinks or equivalent per week    Comment: reports one a week   Drug use: Never   Sexual activity: Not on file  Other Topics Concern   Not on file  Social History Narrative   Not on file   Social Determinants of Health   Financial Resource Strain: Not on file  Food Insecurity: Not on file  Transportation Needs: Not on file  Physical Activity: Not on file  Stress: Not on file  Social Connections: Not on file    Allergies: No Known Allergies  Metabolic Disorder Labs: Lab Results  Component Value Date   HGBA1C 4.4 (L) 07/09/2020   MPG 79.58 07/09/2020   No results found for: PROLACTIN Lab Results  Component Value Date   CHOL 131 07/09/2020   TRIG 143 07/09/2020   HDL 59 07/09/2020   CHOLHDL 2.2 07/09/2020   VLDL 29 07/09/2020   LDLCALC 43 07/09/2020   Lab Results  Component Value Date   TSH 2.264 07/09/2020   TSH 2.210 05/01/2018    Therapeutic Level Labs: No results found for: LITHIUM No results found for: VALPROATE No components found for:  CBMZ  Current Medications: Current Outpatient Medications  Medication Sig Dispense Refill   busPIRone (BUSPAR) 10 MG tablet Take 1 tablet (10 mg total) by mouth 2 (two) times daily. 60 tablet 2   DULoxetine (CYMBALTA) 30 MG capsule Take 1 capsule (30 mg total) by mouth daily. For depression 30 capsule 2   hydrOXYzine (ATARAX/VISTARIL) 25 MG tablet Take 2 tablets (50 mg total) by mouth 3 (three) times daily as needed for anxiety. 30 tablet 0   [START ON 12/04/2021] lisdexamfetamine (VYVANSE)  70 MG capsule Take 1 capsule (70 mg total) by mouth every morning. 30 capsule 0   [START ON 01/03/2022] lisdexamfetamine (VYVANSE) 70 MG capsule Take 1 capsule (70 mg total) by mouth daily. 30 capsule 0   Lurasidone HCl 60 MG TABS Take 1 tablet (60 mg total) by mouth daily with breakfast. 30 tablet 1   propranolol (INDERAL) 10 MG tablet Take 1 tablet (10 mg total) by mouth 2 (two) times daily as needed. 15 tablet 0   traZODone (DESYREL) 50 MG tablet Take 1 tablet (50 mg total) by mouth at bedtime as needed for sleep. 30 tablet 2   No current facility-administered medications for this visit.     Musculoskeletal: Strength & Muscle Tone: Unable to assess due to telemedicine visit Gait & Station: Unable to assess due to telemedicine visit Patient leans: Unable to assess due to telemedicine visit  Psychiatric Specialty Exam: Review of Systems  Psychiatric/Behavioral:  Positive for sleep disturbance. Negative for decreased concentration, dysphoric mood, hallucinations, self-injury and suicidal ideas. The patient is not nervous/anxious and is not hyperactive.    unknown if currently breastfeeding.There is no height or weight on file to calculate BMI.  General Appearance: Unable to assess due to telemedicine visit  Eye Contact:  Unable to assess due to telemedicine visit  Speech:  Clear and Coherent and Normal Rate  Volume:  Normal  Mood:  Depressed  Affect:  Congruent  Thought Process:  Coherent and Descriptions of Associations: Intact  Orientation:  Full (Time, Place, and Person)  Thought Content: WDL and Logical   Suicidal Thoughts:  No  Homicidal Thoughts:  No  Memory:  Immediate;   Good Recent;   Good Remote;   Good  Judgement:  Good  Insight:  Fair  Psychomotor Activity:  Normal  Concentration:  Concentration: Good and Attention Span: Good  Recall:  Good  Fund of Knowledge: Good  Language: Good  Akathisia:  NA  Handed:  Right  AIMS (if indicated): not done  Assets:   Communication Skills Desire for Improvement Financial Resources/Insurance Housing Vocational/Educational  ADL's:  Intact  Cognition: WNL  Sleep:  Fair   Screenings: AIMS    Flowsheet Row [facility] ([facility]  AIMS  Total Score 0      AUDIT    Flowsheet Row [facility] Admission (Discharged) from [facility]  Alcohol Use Disorder Identification Test Final Score (AUDIT) 2 3      GAD-7    Flowsheet Row Video Visit from 11/18/2021 in [facility] Video Visit from 10/13/2021 in [facility] Clinical Support from 10/07/2021 in [facility] Video Visit from 08/27/2021 in [facility] Video Visit from 05/28/2021 in [facility]  Total GAD-7 Score 10 3 9 15 4       PHQ2-9    Flowsheet Row Video Visit from 11/18/2021 in [facility] Video Visit from 10/13/2021 in [facility] Clinical Support from 10/07/2021 in [facility] Video Visit from 08/27/2021 in [facility] Video Visit from 05/28/2021 in [facility]  PHQ-2 Total Score 6 2 1 1  0  PHQ-9 Total Score 23 6 -- -- --      Flowsheet Row Video Visit from 11/18/2021 in [facility] Video Visit from 10/13/2021 in [facility] Clinical Support from 10/07/2021 in [facility]  C-SSRS RISK CATEGORY Moderate Risk Low Risk Low Risk        Assessment and Plan:   [name] "[name] Signor" is a 30 year old female with a past psychiatric history significant for  generalized anxiety disorder, bipolar disorder, and 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  Patient reports that she went 5 days without taking her medications consistently but has been compliant with her medications for 3 days.  She feels that her medications are helpful, however, she still experiences depression she attributes to stressors in her life.  Patient would like to continue taking her medications as prescribed and reports that moving in with her aunt will encourage her to take her medications consistently.  Patient's medications to be e-prescribed to pharmacy of choice.  1. Generalized anxiety disorder  - busPIRone (BUSPAR) 10 MG tablet; Take 1 tablet (10 mg total) by mouth 2 (two) times daily.  Dispense: 60 tablet; Refill: 2 - DULoxetine (CYMBALTA) 30 MG capsule; Take 1 capsule (30 mg total) by mouth daily. For depression  Dispense: 30 capsule; Refill: 2  2. Bipolar disorder, current episode mixed, moderate (HCC)  - busPIRone (BUSPAR) 10 MG tablet; Take 1 tablet (10 mg total) by mouth 2 (two) times daily.  Dispense: 60 tablet; Refill: 2 - DULoxetine (CYMBALTA) 30 MG capsule; Take 1 capsule (30 mg total) by mouth daily. For depression  Dispense: 30 capsule; Refill: 2 - Lurasidone HCl 60 MG TABS; Take 1 tablet (60 mg total) by mouth daily with breakfast.  Dispense: 30 tablet; Refill: 1  3. Attention deficit hyperactivity disorder (ADHD), predominantly inattentive type  - lisdexamfetamine (VYVANSE) 70 MG capsule; Take 1 capsule (70 mg total) by mouth every morning.  Dispense: 30 capsule; Refill: 0 - lisdexamfetamine (VYVANSE) 70 MG capsule; Take 1 capsule (70 mg total) by mouth daily.  Dispense: 30 capsule; Refill: 0  Patient to follow up in 2 months Provider spent a total of 23 minutes with the patient/reviewing  patient's chart  Meta Hatchet, PA 11/18/2021,  2:22 PM

## 2022-01-20 ENCOUNTER — Telehealth (INDEPENDENT_AMBULATORY_CARE_PROVIDER_SITE_OTHER): Payer: Medicaid Other | Admitting: Family

## 2022-01-20 ENCOUNTER — Encounter (HOSPITAL_COMMUNITY): Payer: Self-pay | Admitting: Physician Assistant

## 2022-01-20 DIAGNOSIS — G4700 Insomnia, unspecified: Secondary | ICD-10-CM

## 2022-01-20 DIAGNOSIS — F3162 Bipolar disorder, current episode mixed, moderate: Secondary | ICD-10-CM | POA: Diagnosis not present

## 2022-01-20 DIAGNOSIS — F411 Generalized anxiety disorder: Secondary | ICD-10-CM | POA: Diagnosis not present

## 2022-01-20 DIAGNOSIS — F900 Attention-deficit hyperactivity disorder, predominantly inattentive type: Secondary | ICD-10-CM | POA: Diagnosis not present

## 2022-01-20 MED ORDER — BUSPIRONE HCL 10 MG PO TABS: 10.0000 mg | ORAL_TABLET | Freq: Two times a day (BID) | ORAL | 2 refills | Status: DC

## 2022-01-20 MED ORDER — LURASIDONE HCL 60 MG PO TABS: 60.0000 mg | ORAL_TABLET | Freq: Every day | ORAL | 1 refills | Status: DC

## 2022-01-20 MED ORDER — DULOXETINE HCL 30 MG PO CPEP: 30.0000 mg | ORAL_CAPSULE | Freq: Every day | ORAL | 2 refills | Status: DC

## 2022-01-20 MED ORDER — TRAZODONE HCL 50 MG PO TABS: 50.0000 mg | ORAL_TABLET | Freq: Every evening | ORAL | 2 refills | Status: DC | PRN

## 2022-01-20 NOTE — Progress Notes (Addendum)
Virtual Visit via Telephone Note  I connected with Wanda Torres on 01/20/22 at 11:30 AM EST by telephone and verified that I am speaking with the correct person using two identifiers.  Location: Patient: Home Provider: Office   I discussed the limitations, risks, security and privacy concerns of performing an evaluation and management service by telephone and the availability of in person appointments. I also discussed with the patient that there may be a patient responsible charge related to this service. The patient expressed understanding and agreed to proceed.   I discussed the assessment and treatment plan with the patient. The patient was provided an opportunity to ask questions and all were answered. The patient agreed with the plan and demonstrated an understanding of the instructions.   The patient was advised to call back or seek an in-person evaluation if the symptoms worsen or if the condition fails to improve as anticipated.  I provided 15 minutes of non-face-to-face time during this encounter.   Wanda Rack, NP   [facility] MD/PA/NP OP Progress Note  01/20/2022 10:28 AM Wanda Torres  MRN:  [id number]    Evaluation: Wanda Torres was evaluated telephonically.  She reports overall her mood has improved since taking medications.  She is denying suicidal or homicidal ideations.  Denies auditory visual hallucinations.  Rating her depression 1 out of 10 with 10 being the worst.  Reports a good appetite.  States she is resting well throughout the night.     Wanda Torres  is currently prescribed Cymbalta 30 mg daily and BuSpar 10 mg twice daily, Latuda 60 mg daily and trazodone.  She reports taking and tolerating medications well.  Denying any medication side effects.    Wanda Torres  reports concerns with  "tiredness" which she reports is a new symptom.  Discussed following up for vitamin D and TSH levels with her primary care.  Patient was receptive to plan.  No other safety  concerns noted.  Support, encouragement and  reassurance was provided.   Visit Diagnosis:    ICD-10-CM   1. Generalized anxiety disorder  F41.1 busPIRone (BUSPAR) 10 MG tablet    DULoxetine (CYMBALTA) 30 MG capsule    2. Bipolar disorder, current episode mixed, moderate (HCC)  F31.62 busPIRone (BUSPAR) 10 MG tablet    DULoxetine (CYMBALTA) 30 MG capsule    Lurasidone HCl 60 MG TABS    3. Attention deficit hyperactivity disorder (ADHD), predominantly inattentive type  F90.0     4. Insomnia, unspecified type  G47.00 traZODone (DESYREL) 50 MG tablet      Past Psychiatric History:  Past Medical History:  Past Medical History:  Diagnosis Date   Acne    Allergy    Anemia    Anxiety    Depression    Psoriasis    Supervision of other high risk pregnancy, antepartum 01/19/2019   Formatting of this note might be different from the original. Partner prefers She/Her pronouns   No past surgical history on file.  Family Psychiatric History:  Family History:  Family History  Problem Relation Age of Onset   Anxiety disorder Mother    Depression Mother    Sexual abuse Mother    Drug abuse Father    ADD / ADHD Sister    Anxiety disorder Sister    Bipolar disorder Sister    Depression Sister    Drug abuse Sister    Sexual abuse Sister    Anxiety disorder Brother    Depression Brother    Sexual  abuse Brother    Alcohol abuse Maternal Aunt    Bipolar disorder Maternal Aunt    Depression Maternal Aunt    Sexual abuse Maternal Aunt    ADD / ADHD Maternal Uncle    Alcohol abuse Maternal Uncle    Drug abuse Maternal Uncle    Sexual abuse Maternal Uncle    Alcohol abuse Maternal Grandfather    Dementia Maternal Grandfather    Depression Maternal Grandfather    Sexual abuse Maternal Grandfather    Depression Maternal Grandmother    Sexual abuse Maternal Grandmother    Bipolar disorder Maternal Aunt    Depression Maternal Aunt    Sexual abuse Maternal Aunt     Social History:   Social History   Socioeconomic History   Marital status: Divorced    Spouse name: Not on file   Number of children: Not on file   Years of education: Not on file   Highest education level: Not on file  Occupational History    Comment: Supervisor reservations; 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[employer]  Tobacco Use   Smoking status: Never   Smokeless tobacco: Never   Tobacco comments:    Vaps daily   Vaping Use   Vaping Use: Every day   Substances: Nicotine  Substance and Sexual Activity   Alcohol use: Yes    Alcohol/week: 1.0 standard drink    Types: 1 Standard drinks or equivalent per week    Comment: reports one a week   Drug use: Never   Sexual activity: Not on file  Other Topics Concern   Not on file  Social History Narrative   Not on file   Social Determinants of Health   Financial Resource Strain: Not on file  Food Insecurity: Not on file  Transportation Needs: Not on file  Physical Activity: Not on file  Stress: Not on file  Social Connections: Not on file    Allergies: No Known Allergies  Metabolic Disorder Labs: Lab Results  Component Value Date   HGBA1C 4.4 (L) 07/09/2020   MPG 79.58 07/09/2020   No results found for: PROLACTIN Lab Results  Component Value Date   CHOL 131 07/09/2020   TRIG 143 07/09/2020   HDL 59 07/09/2020   CHOLHDL 2.2 07/09/2020   VLDL 29 07/09/2020   LDLCALC 43 07/09/2020   Lab Results  Component Value Date   TSH 2.264 07/09/2020   TSH 2.210 05/01/2018    Therapeutic Level Labs: No results found for: LITHIUM No results found for: VALPROATE No components found for:  CBMZ  Current Medications: Current Outpatient Medications  Medication Sig Dispense Refill   busPIRone (BUSPAR) 10 MG tablet Take 1 tablet (10 mg total) by mouth 2 (two) times daily. 60 tablet 2   DULoxetine (CYMBALTA) 30 MG capsule Take 1 capsule (30 mg total) by mouth daily. For depression 30 capsule 2   lisdexamfetamine (VYVANSE) 70 MG capsule Take 1 capsule  (70 mg total) by mouth every morning. 30 capsule 0   lisdexamfetamine (VYVANSE) 70 MG capsule Take 1 capsule (70 mg total) by mouth daily. 30 capsule 0   Lurasidone HCl 60 MG TABS Take 1 tablet (60 mg total) by mouth daily with breakfast. 30 tablet 1   traZODone (DESYREL) 50 MG tablet Take 1 tablet (50 mg total) by mouth at bedtime as needed for sleep. 30 tablet 2   No current facility-administered medications for this visit.     Musculoskeletal:  Psychiatric Specialty Exam: Review of Systems  unknown if  currently breastfeeding.There is no height or weight on file to calculate BMI.  General Appearance: NA  Eye Contact:  NA  Speech:  Clear and Coherent  Volume:  Normal  Mood:  Euthymic  Affect:  Congruent  Thought Process:  Coherent  Orientation:  Full (Time, Place, and Person)  Thought Content: Logical   Suicidal Thoughts:  No  Homicidal Thoughts:  No  Memory:  Immediate;   Fair Recent;   Fair  Judgement:  Good  Insight:  Good  Psychomotor Activity:  Normal  Concentration:  Concentration: Good  Recall:  Good  Fund of Knowledge: Good  Language: Good  Akathisia:  Handed:  Right  AIMS (if indicated): done  Assets:  Communication Skills Desire for Improvement Resilience Social Support  ADL's:  Intact  Cognition: WNL  Sleep:  Good   Screenings: AIMS    Flowsheet Row Ad[facility] (Discharged) [facility]  AIMS Total Score 0      AUDIT    Flowsheet Row Ad[facility]) [facility] Admission (Discharged) from [facility]  Alcohol Use Disorder Identification Test Final Score (AUDIT) 2 3      GAD-7    Flowsheet Row [facility]  [facility]  Total GAD-7 Score 10 3 9 15 4       PHQ2-9    Flowsheet Row [facility]  PHQ-2 Total Score 6 2 1 1  0  PHQ-9 Total Score 23 6 -- -- --      Flowsheet Row [facility]  C-SSRS RISK CATEGORY Moderate Risk Low Risk Low Risk        Assessment and Plan: Clearnce Sorrellizabeth Lerette 31 year old female that presents for medication management.  She reports overall her mood has improved with medications.  Denying any medication side effects.  Does reports new onset of "tiredness".  Reports a good appetite.  States she is resting well throughout the night.  Patient denied possible pregnancy nor that she is followed up for vitamin D or TSH levels.  Patient encouraged to follow-up with primary care provider.  Follow-up with medication management 4 to 6 weeks.   Major depressive disorder Generalized anxiety disorder Bipolar disorder Attention deficit disorder  Continues Cymbalta 30 mg daily Continue BuSpar 10 mg p.o. twice daily Continue Latuda 60 mg daily Continue trazodone 50 mg p.o. nightly as needed  Follow-up 4 to 6 weeks for medication management   Wanda Rackanika N Troy Hartzog, NP 01/20/2022, 10:28 AM

## 2022-02-15 ENCOUNTER — Telehealth (HOSPITAL_COMMUNITY): Payer: Self-pay | Admitting: Physician Assistant

## 2022-02-15 NOTE — Telephone Encounter (Signed)
Pt reports in the last 5 days she has had 4 panic attacks.  Buspbar seems to maker her nauseated and doesn't work. Please advise [phone].

## 2022-02-17 ENCOUNTER — Telehealth (INDEPENDENT_AMBULATORY_CARE_PROVIDER_SITE_OTHER): Payer: Medicaid Other | Admitting: Physician Assistant

## 2022-02-17 ENCOUNTER — Encounter (HOSPITAL_COMMUNITY): Payer: Self-pay | Admitting: Physician Assistant

## 2022-02-17 DIAGNOSIS — F3162 Bipolar disorder, current episode mixed, moderate: Secondary | ICD-10-CM | POA: Diagnosis not present

## 2022-02-17 DIAGNOSIS — T887XXA Unspecified adverse effect of drug or medicament, initial encounter: Secondary | ICD-10-CM | POA: Insufficient documentation

## 2022-02-17 DIAGNOSIS — F411 Generalized anxiety disorder: Secondary | ICD-10-CM | POA: Diagnosis not present

## 2022-02-17 DIAGNOSIS — F900 Attention-deficit hyperactivity disorder, predominantly inattentive type: Secondary | ICD-10-CM

## 2022-02-17 DIAGNOSIS — G4700 Insomnia, unspecified: Secondary | ICD-10-CM

## 2022-02-17 MED ORDER — ONDANSETRON HCL 4 MG PO TABS: 4.0000 mg | ORAL_TABLET | Freq: Every day | ORAL | 0 refills | Status: AC | PRN

## 2022-02-17 MED ORDER — BUSPIRONE HCL 15 MG PO TABS: 15.0000 mg | ORAL_TABLET | Freq: Two times a day (BID) | ORAL | 2 refills | Status: DC

## 2022-02-17 NOTE — Telephone Encounter (Signed)
Patient was seen today during the encounter.  All patient's questions were addressed during the encounter.

## 2022-02-17 NOTE — Progress Notes (Signed)
BH MD/PA/NP OP Progress Note  Virtual Visit via Video Note  I connected with Wanda Torres on 02/17/22 at 10:00 AM EST by a video enabled telemedicine application and verified that I am speaking with the correct person using two identifiers.  Location: Patient: Home Provider: Clinic   I discussed the limitations of evaluation and management by telemedicine and the availability of in person appointments. The patient expressed understanding and agreed to proceed.  Follow Up Instructions:   I discussed the assessment and treatment plan with the patient. The patient was provided an opportunity to ask questions and all were answered. The patient agreed with the plan and demonstrated an understanding of the instructions.   The patient was advised to call back or seek an in-person evaluation if the symptoms worsen or if the condition fails to improve as anticipated.  I provided 12 minutes of non-face-to-face time during this encounter.  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  Patient is currently being managed on the following medications:  Buspirone 10 mg 2 times daily Duloxetine 30 mg daily Lurasidone 60 mg daily Vyvanse 70 mg daily Trazodone 50 mg at bedtime  Patient reports that she has been experiencing panic attacks for the last 5 days.  In addition to her panic attacks, patient states that she has also been experiencing nausea and vomiting she attributes to her  buspirone use.  Patient states that her nausea and vomiting usually occur around the time she takes her second dose of buspirone.  Patient states that she thought buspirone was helpful the past, but lately, she feels that the medication may just be making her sick.  Patient endorses multiple stressors related to ongoing court appearances related to custody over her eldest daughter.  Patient reports that she has been doing really well with being compliant with her medications since living with her aunt.  Patient still endorses some depression related to stressors.  Patient endorses the following depressive symptoms: increased leap, low mood, decreased concentration, and loss of interest in activities/hobbies.  A PHQ-9 screen was performed with the patient scoring a 16.  A GAD-7 screen was also performed with the patient scoring a 12.  Patient is alert and oriented x4, calm, cooperative, and fully engaged in conversation during the encounter.  Patient describes her mood as being "meh."  Patient denies suicidal or homicidal ideations.  She further denies auditory or visual hallucinations and does not appear to be responding to internal/external stimuli.  Patient endorses good sleep and receives on average 8 to 9 hours of sleep each night.  Patient endorses decreased appetite; however, she does report that she was eating roughly 2 meals a day prior to her nausea/vomiting episodes.  Patient endorses alcohol consumption occasionally.  Patient denies tobacco use but states that she does engage in vaping.  Patient denies illicit drug use.  Visit Diagnosis:    ICD-10-CM   1. Generalized anxiety disorder  F41.1 busPIRone (BUSPAR) 15 MG tablet    2. Bipolar disorder, current episode mixed, moderate (HCC)  F31.62  busPIRone (BUSPAR) 15 MG tablet    3. Medication side effects  T88.7XXA ondansetron (ZOFRAN) 4 MG tablet    4. Insomnia, unspecified type  G47.00     5. Attention deficit hyperactivity disorder (ADHD),  predominantly inattentive type  F90.0       Past Psychiatric History:  Bipolar Disorder ADHD Insomnia Generalized anxiety disorder  Past Medical History:  Past Medical History:  Diagnosis Date   Acne    Allergy    Anemia    Anxiety    Depression    Psoriasis    Supervision of other high risk pregnancy, antepartum 01/19/2019   Formatting of this note might be different from the original. Partner prefers She/Her pronouns   History reviewed. No pertinent surgical history.  Family Psychiatric History:  N/A  Family History:  Family History  Problem Relation Age of Onset   Anxiety disorder Mother    Depression Mother    Sexual abuse Mother    Drug abuse Father    ADD / ADHD Sister    Anxiety disorder Sister    Bipolar disorder Sister    Depression Sister    Drug abuse Sister    Sexual abuse Sister    Anxiety disorder Brother    Depression Brother    Sexual abuse Brother    Alcohol abuse Maternal Aunt    Bipolar disorder Maternal Aunt    Depression Maternal Aunt    Sexual abuse Maternal Aunt    ADD / ADHD Maternal Uncle    Alcohol abuse Maternal Uncle    Drug abuse Maternal Uncle    Sexual abuse Maternal Uncle    Alcohol abuse Maternal Grandfather    Dementia Maternal Grandfather    Depression Maternal Grandfather    Sexual abuse Maternal Grandfather    Depression Maternal Grandmother    Sexual abuse Maternal Grandmother    Bipolar disorder Maternal Aunt    Depression Maternal Aunt    Sexual abuse Maternal Aunt     Social History:  Social History   Socioeconomic History   Marital status: Divorced    Spouse name: Not on file   Number of children: Not on file   Years of education: Not on file   Highest education level: Not on file  Occupational History    Comment: Supervisor reservations; 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[employer]  Tobacco Use   Smoking status: Never   Smokeless tobacco: Never   Tobacco comments:    Vaps daily   Vaping Use   Vaping Use: Every  day   Substances: Nicotine  Substance and Sexual Activity   Alcohol use: Yes    Alcohol/week: 1.0 standard drink    Types: 1 Standard drinks or equivalent per week    Comment: reports one a week   Drug use: Never   Sexual activity: Not on file  Other Topics Concern   Not on file  Social History Narrative   Not on file   Social Determinants of Health   Financial Resource Strain: Not on file  Food Insecurity: Not on file  Transportation Needs: Not on file  Physical Activity: Not on file  Stress: Not on file  Social Connections: Not on file    Allergies: No Known Allergies  Metabolic Disorder Labs: Lab Results  Component Value Date   HGBA1C 4.4 (L) 07/09/2020   MPG 79.58 07/09/2020   No results found for: PROLACTIN Lab Results  Component Value Date   CHOL 131 07/09/2020   TRIG 143 07/09/2020  HDL 59 07/09/2020   CHOLHDL 2.2 07/09/2020   VLDL 29 07/09/2020   LDLCALC 43 07/09/2020   Lab Results  Component Value Date   TSH 2.264 07/09/2020   TSH 2.210 05/01/2018    Therapeutic Level Labs: No results found for: LITHIUM No results found for: VALPROATE No components found for:  CBMZ  Current Medications: Current Outpatient Medications  Medication Sig Dispense Refill   ondansetron (ZOFRAN) 4 MG tablet Take 1 tablet (4 mg total) by mouth daily as needed for nausea or vomiting. 30 tablet 0   busPIRone (BUSPAR) 15 MG tablet Take 1 tablet (15 mg total) by mouth 2 (two) times daily. 60 tablet 2   DULoxetine (CYMBALTA) 30 MG capsule Take 1 capsule (30 mg total) by mouth daily. For depression 30 capsule 2   lisdexamfetamine (VYVANSE) 70 MG capsule Take 1 capsule (70 mg total) by mouth every morning. 30 capsule 0   lisdexamfetamine (VYVANSE) 70 MG capsule Take 1 capsule (70 mg total) by mouth daily. 30 capsule 0   Lurasidone HCl 60 MG TABS Take 1 tablet (60 mg total) by mouth daily with breakfast. 30 tablet 1   traZODone (DESYREL) 50 MG tablet Take 1 tablet (50 mg total)  by mouth at bedtime as needed for sleep. 30 tablet 2   No current facility-administered medications for this visit.     Musculoskeletal: Strength & Muscle Tone: Unable to assess due to telemedicine visit Gait & Station: Unable to assess due to telemedicine visit Patient leans: Unable to assess due to telemedicine visit  Psychiatric Specialty Exam: Review of Systems  Psychiatric/Behavioral:  Negative for decreased concentration, dysphoric mood, hallucinations, self-injury, sleep disturbance and suicidal ideas. The patient is nervous/anxious. The patient is not hyperactive.    unknown if currently breastfeeding.There is no height or weight on file to calculate BMI.  General Appearance: Casual  Eye Contact:  Good  Speech:  Clear and Coherent and Normal Rate  Volume:  Normal  Mood:  Anxious and Depressed  Affect:  Congruent  Thought Process:  Coherent, Goal Directed, and Descriptions of Associations: Intact  Orientation:  Full (Time, Place, and Person)  Thought Content: WDL   Suicidal Thoughts:  No  Homicidal Thoughts:  No  Memory:  Immediate;   Good Recent;   Good Remote;   Good  Judgement:  Good  Insight:  Fair  Psychomotor Activity:  Normal  Concentration:  Concentration: Good and Attention Span: Good  Recall:  Good  Fund of Knowledge: Good  Language: Good  Akathisia:  No  Handed:  Right  AIMS (if indicated): not done  Assets:  Communication Skills Desire for Improvement Financial Resources/Insurance Housing Vocational/Educational  ADL's:  Intact  Cognition: WNL  Sleep:  Good   Screenings: AIMS    Flowsheet Row Ad[facility] (Discharged) [facility]  AIMS Total Score 0      AUDIT    Flowsheet Row [facility] Admission (Discharged) from [facility]  Alcohol Use  Disorder Identification Test Final Score (AUDIT) 2 3      GAD-7    Flowsheet Row Video Visit from 02/17/2022 in [facility] Video Visit from 11/18/2021 in [facility] Video Visit from 10/13/2021 in [facility] Clinical Support from 10/07/2021 in [facility] Video Visit from 08/27/2021 in  [facility]  Total GAD-7 Score 12 10 3 9 15       PHQ2-9    Flowsheet Row Video Visit from 02/17/2022 in [facility] Video Visit from 11/18/2021 in [facility] Video Visit from 10/13/2021 in [facility] Clinical Support from 10/07/2021 in [facility] Video Visit from 08/27/2021 in [facility]  PHQ-2 Total Score 5 6 2 1 1   PHQ-9 Total Score 16 23 6  -- --      Flowsheet Row Video Visit from 02/17/2022 in [facility] Video Visit from 11/18/2021 in [facility] Video Visit from 10/13/2021 in [facility]  C-SSRS RISK CATEGORY Low Risk Moderate Risk Low Risk        Assessment and Plan:   [facility] "Wanda Moeckel" is a 31 year old female with a past psychiatric history significant for generalized anxiety disorder, bipolar disorder, insomnia, and 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  Patient reports that she has been experiencing nausea and vomiting she attributes to her buspirone use.  Patient continues to endorse mild depression as well as elevated anxiety due to stressors in her life.  Provider recommended increasing her dosage of buspirone from 10 mg to 15 mg 2 times daily.   Provider to place patient on samples of Zofran 4 mg daily as needed for the management of nausea/vomiting she attributes to medication use.  Patient is agreeable to recommendations.  Patient's medications to be e-prescribed to pharmacy of choice.  Collaboration of Care: Collaboration of Care: Medication Management AEB provider managing patient's psychiatric medications and Psychiatrist AEB patient being seen by psychiatric provider  Patient/Guardian was advised Release of Information must be obtained prior to any record release in order to collaborate their care with an outside provider. Patient/Guardian was advised if they have not already done so to contact the registration department to sign all necessary forms in order for Wanda Torres to release information regarding their care.   Consent: Patient/Guardian gives verbal consent for treatment and assignment of benefits for services provided during this visit. Patient/Guardian expressed understanding and agreed to proceed.   1. Generalized anxiety disorder Patient to continue taking Cymbalta 30 mg daily for the management of her generalized anxiety disorder  - busPIRone (BUSPAR) 15 MG tablet; Take 1 tablet (15 mg total) by mouth 2 (two) times daily.  Dispense: 60 tablet; Refill: 2  2. Bipolar disorder, current episode mixed, moderate (HCC) Patient to continue taking Latuda 60 mg daily for the management of her bipolar disorder  3. Medication side effects  - ondansetron (ZOFRAN) 4 MG tablet; Take 1 tablet (4 mg total) by mouth daily as needed for nausea or vomiting.  Dispense: 30 tablet; Refill: 0  4. Insomnia, unspecified type Patient to continue taking trazodone 50 mg at bedtime for the management of her insomnia  5. Attention deficit hyperactivity disorder (ADHD), predominantly inattentive type Patient to continue taking Vyvanse 70 mg daily for the management of her attention deficit hyperactivity disorder  Patient to follow up on  04/14/2022. Provider spent a total of 12 minutes with the patient/reviewing patient's chart  [facility], PA 02/17/2022, 5:42 PM

## 2022-02-26 ENCOUNTER — Other Ambulatory Visit (HOSPITAL_COMMUNITY): Payer: Self-pay | Admitting: Physician Assistant

## 2022-02-26 ENCOUNTER — Telehealth (HOSPITAL_COMMUNITY): Payer: Self-pay | Admitting: *Deleted

## 2022-02-26 DIAGNOSIS — F900 Attention-deficit hyperactivity disorder, predominantly inattentive type: Secondary | ICD-10-CM

## 2022-02-26 MED ORDER — LISDEXAMFETAMINE DIMESYLATE 70 MG PO CAPS: 70.0000 mg | ORAL_CAPSULE | ORAL | 0 refills | Status: DC

## 2022-02-26 MED ORDER — LISDEXAMFETAMINE DIMESYLATE 70 MG PO CAPS: 70.0000 mg | ORAL_CAPSULE | Freq: Every day | ORAL | 0 refills | Status: DC

## 2022-02-26 NOTE — Telephone Encounter (Signed)
Call from patient requesting a new rx for her Vyvanse. She should be out. Last rx called in 01/03/22 and she has a future appt scheduled for 04/14/22. Will forward her request to her provider for a new rx to be esigned to her preferred pharmacy.  ?

## 2022-03-01 ENCOUNTER — Telehealth (HOSPITAL_COMMUNITY): Payer: Self-pay | Admitting: *Deleted

## 2022-03-01 NOTE — Telephone Encounter (Signed)
PATIENT CALLED LVM REQUESTED REFILL FOR  :  Lurasidone HCl 60 MG TABS ?

## 2022-03-02 ENCOUNTER — Other Ambulatory Visit (HOSPITAL_COMMUNITY): Payer: Self-pay | Admitting: Physician Assistant

## 2022-03-02 DIAGNOSIS — F3162 Bipolar disorder, current episode mixed, moderate: Secondary | ICD-10-CM

## 2022-03-02 MED ORDER — LURASIDONE HCL 60 MG PO TABS: 60.0000 mg | ORAL_TABLET | Freq: Every day | ORAL | 2 refills | Status: DC

## 2022-03-02 NOTE — Progress Notes (Signed)
Provider was contacted Direce Netty Starring, RMA regarding patient's medication refills. Patient's medications to be e-prescribed to pharmacy of choice. ?

## 2022-03-02 NOTE — Telephone Encounter (Signed)
Provider was contacted Direce E. McIntyre, RMA regarding patient's medication refills. Patient's medications to be e-prescribed to pharmacy of choice. ?

## 2022-03-02 NOTE — Telephone Encounter (Signed)
Provider was contacted by Glory Buff. Olevia Bowens, RN regarding patient's medication management. Patient's medications were e-prescribed to pharmacy of choice.

## 2022-03-10 ENCOUNTER — Other Ambulatory Visit (HOSPITAL_COMMUNITY): Payer: Self-pay | Admitting: Physician Assistant

## 2022-03-10 ENCOUNTER — Telehealth (HOSPITAL_COMMUNITY): Payer: Self-pay | Admitting: *Deleted

## 2022-03-10 DIAGNOSIS — F411 Generalized anxiety disorder: Secondary | ICD-10-CM

## 2022-03-10 MED ORDER — GABAPENTIN 100 MG PO CAPS: 100.0000 mg | ORAL_CAPSULE | Freq: Three times a day (TID) | ORAL | 1 refills | Status: DC

## 2022-03-10 NOTE — Progress Notes (Signed)
Provider was contacted by Orpah Clinton. Reola Calkins, RN regarding patient's ongoing side effects from her medication.  Provider was able to contact patient.  Patient states that she is still experiencing issues with nausea and vomiting.  She reports that she has tried taking Zofran before taking her buspirone usage as well as afterwards, but still continues to experience nausea and vomiting.  She reports that Zofran also made her drowsy. ? ?Patient is interested in taking gabapentin for the management of her anxiety.  Patient has used gabapentin in the past for the management of her anxiety with some success.  The only drawback to using gabapentin, according to the patient, was lower extremity swelling.  Patient is comfortable with rechallenging the use of gabapentin as an adjunct to measure for the management of her anxiety.  Patient to be placed on gabapentin 100 mg 3 times daily.  Patient medication to be e-prescribed to pharmacy of choice. ?

## 2022-03-10 NOTE — Telephone Encounter (Signed)
VM from beth that she is continuing to have N &V and was told by her provider if it persists to let him know, she is calling to let him know she is still having sx. She would like to speak with Matheson PA. Will forward the request to him.  ?

## 2022-03-10 NOTE — Telephone Encounter (Signed)
Provider was contacted by Orpah Clinton. Reola Calkins, RN regarding patient's ongoing side effects from her medication.  Provider was able to contact patient.  Patient states that she is still experiencing issues with nausea and vomiting.  Patient to discontinue taking buspirone.  Patient endorses improvements in her anxiety through the use of gabapentin in the past.  The only drawback to her usage of gabapentin was lower extremity swelling.  Patient agreed to rechallenge the use of gabapentin as an adjunct of measure for the management of her anxiety.  Patient medication to be e-prescribed to pharmacy of choice.

## 2022-04-14 ENCOUNTER — Encounter (HOSPITAL_COMMUNITY): Payer: Self-pay | Admitting: Physician Assistant

## 2022-04-14 ENCOUNTER — Telehealth (INDEPENDENT_AMBULATORY_CARE_PROVIDER_SITE_OTHER): Payer: Medicaid Other | Admitting: Physician Assistant

## 2022-04-14 DIAGNOSIS — F3162 Bipolar disorder, current episode mixed, moderate: Secondary | ICD-10-CM | POA: Diagnosis not present

## 2022-04-14 DIAGNOSIS — F411 Generalized anxiety disorder: Secondary | ICD-10-CM | POA: Diagnosis not present

## 2022-04-14 DIAGNOSIS — G4700 Insomnia, unspecified: Secondary | ICD-10-CM

## 2022-04-14 MED ORDER — GABAPENTIN 100 MG PO CAPS: 100.0000 mg | ORAL_CAPSULE | Freq: Three times a day (TID) | ORAL | 1 refills | Status: DC

## 2022-04-14 MED ORDER — TRAZODONE HCL 50 MG PO TABS: 50.0000 mg | ORAL_TABLET | Freq: Every evening | ORAL | 2 refills | Status: DC | PRN

## 2022-04-14 MED ORDER — DULOXETINE HCL 30 MG PO CPEP: 30.0000 mg | ORAL_CAPSULE | Freq: Every day | ORAL | 2 refills | Status: DC

## 2022-04-14 MED ORDER — LURASIDONE HCL 60 MG PO TABS: 60.0000 mg | ORAL_TABLET | Freq: Every day | ORAL | 2 refills | Status: DC

## 2022-04-14 NOTE — Progress Notes (Addendum)
BH MD/PA/NP OP Progress Note ? ?Virtual Visit via Video Note ? ?I connected with Wanda Torres on 04/14/22 at 11:30 AM EDT by a video enabled telemedicine application and verified that I am speaking with the correct person using two identifiers. ? ?Location: ?Patient: Home ?Provider: Clinic ?  ?I discussed the limitations of evaluation and management by telemedicine and the availability of in person appointments. The patient expressed understanding and agreed to proceed. ? ?Follow Up Instructions: ?  ?I discussed the assessment and treatment plan with the patient. The patient was provided an opportunity to ask questions and all were answered. The patient agreed with the plan and demonstrated an understanding of the instructions. ?  ?The patient was advised to call back or seek an in-person evaluation if the symptoms worsen or if the condition fails to improve as anticipated. ? ?I provided 22 minutes of non-face-to-face time during this encounter. ? ?Meta Hatchet, PA  ? ? ?04/14/2022 11:51 AM ?[name]  ?MRN:  [id number] ? ?Chief Complaint: No chief complaint on file. ? ?HPI:  ? ?Wanda Torres "Wanda Torres" is a 31 year old female with a past psychiatric history significant for generalized anxiety disorder, bipolar disorder, and insomnia who presents to [facility] via virtual video visit for follow-up and medication management.  Patient is currently being managed on the following medications: ? ?Gabapentin 100 mg 3 times daily ?Duloxetine 30 mg daily ?Lurasidone 60 mg daily ?Vyvanse 70 mg daily ?Trazodone 50 mg at bedtime ? ?Patient reports that since taking gabapentin 100 mg 3 times daily, her anxiety has been manageable.  The last time patient took gabapentin, she experienced ankle and pedal swelling.  Patient denies experiencing swelling or foot pain at this time.  Patient rates her anxiety at 2 out of 10 and states that her anxiety is not much of an  issue these days.  Patient's stressors include her mediation sessions she has with her eldest daughter during her custody.  Patient reports that she has a therapist that she sees through [employer] and she meets with that therapist once a week. ? ?Patient reports that she has been having some issues and she does not know whether to attribute these issues to her depression or her attention deficit hyperactivity disorder.  Since the adjustments to her medications, patient reports that she has more of a routine but still experiences difficulty focusing and lack of motivation.  She reports that she is not sleeping all the time anymore and has been eating more.  Patient continues to experience lack of desire to do things but denies feeling hopeless.  She reports her symptoms as being more attributed to mental fog.  She reports that her Vyvanse is somewhat helpful but still has issues with focusing especially when it comes to time management.  She also reports that she is having difficulty sitting down and starting an activity.  Patient reports that her mood is generally well.  A PHQ-9 screen was performed with the patient scoring a 9.  A GAD-7 screen was also performed with the patient scored a 2. ? ?Patient is alert and oriented x4, pleasant, calm, cooperative, and fully engaged in conversation during the encounter.  Patient denies suicidal or homicidal ideations.  She further denies auditory or visual hallucinations and does not appear to be responding to internal/external stimuli.  Patient endorses good sleep and receives on average 8 hours of sleep each night.  Patient endorses good appetite and eats on  average 2-3 meals per day.  Patient endorses alcohol consumption occasionally.  Patient denies tobacco use but still engages in vaping.  Patient denies illicit drug use. ? ?Visit Diagnosis:  ?  ICD-10-CM   ?1. Generalized anxiety disorder  F41.1 DULoxetine (CYMBALTA) 30 MG capsule  ?  gabapentin (NEURONTIN) 100  MG capsule  ?  ?2. Bipolar disorder, current episode mixed, moderate (HCC)  F31.62 DULoxetine (CYMBALTA) 30 MG capsule  ?  Lurasidone HCl 60 MG TABS  ?  ?3. Insomnia, unspecified type  G47.00 traZODone (DESYREL) 50 MG tablet  ?  ? ? ?Past Psychiatric History:  ?Bipolar Disorder ?ADHD ?Insomnia ?Generalized anxiety disorder ? ?Past Medical History:  ?Past Medical History:  ?Diagnosis Date  ? Acne   ? Allergy   ? Anemia   ? Anxiety   ? Depression   ? Psoriasis   ? Supervision of other high risk pregnancy, antepartum 01/19/2019  ? Formatting of this note might be different from the original. Partner prefers She/Her pronouns  ? History reviewed. No pertinent surgical history. ? ?Family Psychiatric History:  ?N/A ? ?Family History:  ?Family History  ?Problem Relation Age of Onset  ? Anxiety disorder Mother   ? Depression Mother   ? Sexual abuse Mother   ? Drug abuse Father   ? ADD / ADHD Sister   ? Anxiety disorder Sister   ? Bipolar disorder Sister   ? Depression Sister   ? Drug abuse Sister   ? Sexual abuse Sister   ? Anxiety disorder Brother   ? Depression Brother   ? Sexual abuse Brother   ? Alcohol abuse Maternal Aunt   ? Bipolar disorder Maternal Aunt   ? Depression Maternal Aunt   ? Sexual abuse Maternal Aunt   ? ADD / ADHD Maternal Uncle   ? Alcohol abuse Maternal Uncle   ? Drug abuse Maternal Uncle   ? Sexual abuse Maternal Uncle   ? Alcohol abuse Maternal Grandfather   ? Dementia Maternal Grandfather   ? Depression Maternal Grandfather   ? Sexual abuse Maternal Grandfather   ? Depression Maternal Grandmother   ? Sexual abuse Maternal Grandmother   ? Bipolar disorder Maternal Aunt   ? Depression Maternal Aunt   ? Sexual abuse Maternal Aunt   ? ? ?Social History:  ?Social History  ? ?Socioeconomic History  ? Marital status: Divorced  ?  Spouse name: Not on file  ? Number of children: Not on file  ? Years of education: Not on file  ? Highest education level: Not on file  ?Occupational History  ?  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 Smoking status: Never  ? Smokeless tobacco: Never  ? Tobacco comments:  ?  Vaps daily   ?Vaping Use  ? Vaping Use: Every day  ? Substances: Nicotine  ?Substance and Sexual Activity  ? Alcohol use: Yes  ?  Alcohol/week: 1.0 standard drink  ?  Types: 1 Standard drinks or equivalent per week  ?  Comment: reports one a week  ? Drug use: Never  ? Sexual activity: Not on file  ?Other Topics Concern  ? Not on file  ?Social History Narrative  ? Not on file  ? ?Social Determinants of Health  ? ?Financial Resource Strain: Not on file  ?Food Insecurity: Not on file  ?Transportation Needs: Not on file  ?Physical Activity: Not on file  ?Stress: Not on file  ?Social Connections: Not on file  ? ? ?  Allergies: No Known Allergies ? ?Metabolic Disorder Labs: ?Lab Results  ?Component Value Date  ? HGBA1C 4.4 (L) 07/09/2020  ? MPG 79.58 07/09/2020  ? ?No results found for: PROLACTIN ?Lab Results  ?Component Value Date  ? CHOL 131 07/09/2020  ? TRIG 143 07/09/2020  ? HDL 59 07/09/2020  ? CHOLHDL 2.2 07/09/2020  ? VLDL 29 07/09/2020  ? LDLCALC 43 07/09/2020  ? ?Lab Results  ?Component Value Date  ? TSH 2.264 07/09/2020  ? TSH 2.210 05/01/2018  ? ? ?Therapeutic Level Labs: ?No results found for: LITHIUM ?No results found for: VALPROATE ?No components found for:  CBMZ ? ?Current Medications: ?Current Outpatient Medications  ?Medication Sig Dispense Refill  ? DULoxetine (CYMBALTA) 30 MG capsule Take 1 capsule (30 mg total) by mouth daily. For depression 30 capsule 2  ? gabapentin (NEURONTIN) 100 MG capsule Take 1 capsule (100 mg total) by mouth 3 (three) times daily. 90 capsule 1  ? lisdexamfetamine (VYVANSE) 70 MG capsule Take 1 capsule (70 mg total) by mouth every morning. 30 capsule 0  ? lisdexamfetamine (VYVANSE) 70 MG capsule Take 1 capsule (70 mg total) by mouth daily. 30 capsule 0  ? Lurasidone HCl 60 MG TABS Take 1 tablet (60 mg total) by mouth daily with  breakfast. 30 tablet 2  ? ondansetron (ZOFRAN) 4 MG tablet Take 1 tablet (4 mg total) by mouth daily as needed for nausea or vomiting. 30 tablet 0  ? traZODone (DESYREL) 50 MG tablet Take 1 tablet (50 mg total) by mo

## 2022-04-19 ENCOUNTER — Encounter: Payer: Medicaid Other | Admitting: Nurse Practitioner

## 2022-04-20 ENCOUNTER — Encounter: Payer: Medicaid Other | Admitting: Radiology

## 2022-04-27 ENCOUNTER — Telehealth (HOSPITAL_COMMUNITY): Payer: Self-pay | Admitting: *Deleted

## 2022-04-27 NOTE — Telephone Encounter (Signed)
VM from patient requesting rx for her Vyvanse. She last had a rx on 03/28/22 and should be out. She missed her last appt with the provider. SHe last saw Eddie PA on 04/14/22 and has a future appt on 06/16/22. Will forward request to him to consider. ?

## 2022-04-28 ENCOUNTER — Telehealth (HOSPITAL_COMMUNITY): Payer: Self-pay | Admitting: *Deleted

## 2022-04-28 ENCOUNTER — Other Ambulatory Visit (HOSPITAL_COMMUNITY): Payer: Self-pay | Admitting: Physician Assistant

## 2022-04-28 DIAGNOSIS — G4700 Insomnia, unspecified: Secondary | ICD-10-CM

## 2022-04-28 DIAGNOSIS — F900 Attention-deficit hyperactivity disorder, predominantly inattentive type: Secondary | ICD-10-CM

## 2022-04-28 MED ORDER — LISDEXAMFETAMINE DIMESYLATE 70 MG PO CAPS: 70.0000 mg | ORAL_CAPSULE | ORAL | 0 refills | Status: DC

## 2022-04-28 NOTE — Telephone Encounter (Signed)
Left VM for patient that her Vyvanse has been called in to her preferred pharmacy. ?

## 2022-04-28 NOTE — Progress Notes (Signed)
Provider was contacted by Orpah Clinton. Reola Calkins, RN regarding patient's refill request.  Patient's medication to be e-prescribed to pharmacy of choice. ?

## 2022-04-29 NOTE — Telephone Encounter (Signed)
Provider was contacted by Orpah Clinton. Reola Calkins, RN regarding patient's request for Vyvanse prescription.  Patient's medication has been e-prescribed to pharmacy of choice.

## 2022-05-07 ENCOUNTER — Other Ambulatory Visit (HOSPITAL_COMMUNITY): Payer: Self-pay | Admitting: Physician Assistant

## 2022-05-07 DIAGNOSIS — F411 Generalized anxiety disorder: Secondary | ICD-10-CM

## 2022-05-07 DIAGNOSIS — F3162 Bipolar disorder, current episode mixed, moderate: Secondary | ICD-10-CM

## 2022-05-27 ENCOUNTER — Telehealth (HOSPITAL_COMMUNITY): Payer: Self-pay | Admitting: *Deleted

## 2022-05-27 ENCOUNTER — Other Ambulatory Visit (HOSPITAL_COMMUNITY): Payer: Self-pay | Admitting: Physician Assistant

## 2022-05-27 DIAGNOSIS — R87620 Atypical squamous cells of undetermined significance on cytologic smear of vagina (ASC-US): Secondary | ICD-10-CM

## 2022-05-27 DIAGNOSIS — F900 Attention-deficit hyperactivity disorder, predominantly inattentive type: Secondary | ICD-10-CM

## 2022-05-27 HISTORY — DX: Atypical squamous cells of undetermined significance on cytologic smear of vagina (ASC-US): R87.620

## 2022-05-27 MED ORDER — LISDEXAMFETAMINE DIMESYLATE 70 MG PO CAPS: 70.0000 mg | ORAL_CAPSULE | ORAL | 0 refills | Status: DC

## 2022-05-27 NOTE — Progress Notes (Signed)
Provider was contacted by Orpah Clinton. Reola Calkins, RN regarding refill request for patient's Vyvanse prescription.  Patient's Vyvanse to be e-prescribed to pharmacy of choice.

## 2022-05-27 NOTE — Telephone Encounter (Signed)
VM from patient requesting her Vyvanse be sent to her preferred pharmacy. She should be out per chart. She was last seen by Eddie PA in April and has a future appt on 06/16/22. Will forward request to her provider.

## 2022-05-27 NOTE — Telephone Encounter (Signed)
Provider was contacted by Suzanne K. Beck, RN regarding refill request for patient's Vyvanse prescription.  Patient's Vyvanse to be e-prescribed to pharmacy of choice. 

## 2022-06-16 ENCOUNTER — Encounter (HOSPITAL_COMMUNITY): Payer: Self-pay | Admitting: Physician Assistant

## 2022-06-16 ENCOUNTER — Telehealth (INDEPENDENT_AMBULATORY_CARE_PROVIDER_SITE_OTHER): Payer: Medicaid Other | Admitting: Physician Assistant

## 2022-06-16 DIAGNOSIS — F900 Attention-deficit hyperactivity disorder, predominantly inattentive type: Secondary | ICD-10-CM

## 2022-06-16 DIAGNOSIS — F411 Generalized anxiety disorder: Secondary | ICD-10-CM

## 2022-06-16 DIAGNOSIS — F3162 Bipolar disorder, current episode mixed, moderate: Secondary | ICD-10-CM | POA: Diagnosis not present

## 2022-06-16 MED ORDER — LURASIDONE HCL 60 MG PO TABS: 60.0000 mg | ORAL_TABLET | Freq: Every day | ORAL | 1 refills | Status: DC

## 2022-06-16 MED ORDER — GABAPENTIN 100 MG PO CAPS: 100.0000 mg | ORAL_CAPSULE | Freq: Three times a day (TID) | ORAL | 1 refills | Status: DC

## 2022-06-16 MED ORDER — DULOXETINE HCL 30 MG PO CPEP: 30.0000 mg | ORAL_CAPSULE | Freq: Every day | ORAL | 1 refills | Status: DC

## 2022-06-16 MED ORDER — AMPHETAMINE-DEXTROAMPHET ER 10 MG PO CP24: 10.0000 mg | ORAL_CAPSULE | Freq: Every day | ORAL | 0 refills | Status: DC

## 2022-06-16 NOTE — Progress Notes (Addendum)
BH MD/PA/NP OP Progress Note  Virtual Visit via Video Note  I connected with Wanda Torres on 06/16/22 at 11:30 AM EDT by a video enabled telemedicine application and verified that I am speaking with the correct person using two identifiers.  Location: Patient: Home Provider: Clinic   I discussed the limitations of evaluation and management by telemedicine and the availability of in person appointments. The patient expressed understanding and agreed to proceed.  Follow Up Instructions:   I discussed the assessment and treatment plan with the patient. The patient was provided an opportunity to ask questions and all were answered. The patient agreed with the plan and demonstrated an understanding of the instructions.   The patient was advised to call back or seek an in-person evaluation if the symptoms worsen or if the condition fails to improve as anticipated.  I provided 17 minutes of non-face-to-face time during this encounter.  Meta Hatchet, PA    06/16/2022 7:26 PM Wanda Torres  MRN:  [id number]  Chief Complaint:  Chief Complaint  Patient presents with   Follow-up   Medication Management   HPI:   Wanda Torres "Wanda Torres" is a 31 year old female with a past psychiatric history significant for generalized anxiety disorder, bipolar disorder, and insomnia who presents to [facility] via virtual video visit for follow-up and medication management.  Patient is currently being managed on the following medications:  Gabapentin 100 mg 3 times daily Duloxetine 30 mg daily Lurasidone 60 mg daily Vyvanse 70 mg daily Trazodone 50 mg at bedtime  Patient reports that her life has been good as of late.  She reports that she stopped taking Vyvanse due to the medication being ineffective in managing her ADHD symptoms.  Since discontinuing Vyvanse, patient states that she has been failing the full brunt of her ADHD.  She  describes her motivation as being absolutely dead and she is unable to sit down to do anything.  In regards to her other medications, patient states that she has been doing so much better since taking her medications.  Patient reports that her mood has been great and her anxiety has been manageable.  Patient also states that she has been sleeping well.  Patient also adds that she has been engaging and trauma therapy as well.  She does admit to not being on all of her medications for 4 months and reports that there was a period of time where she did not take her medications for 4 days.  Patient is alert and oriented x4, pleasant, calm, cooperative, and fully engaged in conversation during the encounter.  Patient endorses good mood.  Patient denies suicidal or homicidal ideations.  She further denies auditory or visual hallucinations and does not appear to be responding to internal/external stimuli.  Patient endorses good sleep and receives on average 8 hours of sleep each night.  Patient endorses good appetite and eats on average 2 meals per day.  Patient endorses alcohol consumption on occasion.  Patient denies tobacco use but does engage in vaping.  Patient denies illicit drug use.  Visit Diagnosis:    ICD-10-CM   1. Attention deficit hyperactivity disorder (ADHD), predominantly inattentive type  F90.0 amphetamine-dextroamphetamine (ADDERALL XR) 10 MG 24 hr capsule    2. Bipolar disorder, current episode mixed, moderate (HCC)  F31.62 Lurasidone HCl 60 MG TABS    DULoxetine (CYMBALTA) 30 MG capsule    3. Generalized anxiety disorder  F41.1 DULoxetine (CYMBALTA) 30 MG capsule  gabapentin (NEURONTIN) 100 MG capsule      Past Psychiatric History:  Bipolar Disorder ADHD Insomnia Generalized anxiety disorder  Past Medical History:  Past Medical History:  Diagnosis Date   Acne    Allergy    Anemia    Anxiety    Depression    Psoriasis    Supervision of other high risk pregnancy, antepartum  01/19/2019   Formatting of this note might be different from the original. Partner prefers She/Her pronouns   History reviewed. No pertinent surgical history.  Family Psychiatric History:  N/A  Family History:  Family History  Problem Relation Age of Onset   Anxiety disorder Mother    Depression Mother    Sexual abuse Mother    Drug abuse Father    ADD / ADHD Sister    Anxiety disorder Sister    Bipolar disorder Sister    Depression Sister    Drug abuse Sister    Sexual abuse Sister    Anxiety disorder Brother    Depression Brother    Sexual abuse Brother    Alcohol abuse Maternal Aunt    Bipolar disorder Maternal Aunt    Depression Maternal Aunt    Sexual abuse Maternal Aunt    ADD / ADHD Maternal Uncle    Alcohol abuse Maternal Uncle    Drug abuse Maternal Uncle    Sexual abuse Maternal Uncle    Alcohol abuse Maternal Grandfather    Dementia Maternal Grandfather    Depression Maternal Grandfather    Sexual abuse Maternal Grandfather    Depression Maternal Grandmother    Sexual abuse Maternal Grandmother    Bipolar disorder Maternal Aunt    Depression Maternal Aunt    Sexual abuse Maternal Aunt     Social History:  Social History   Socioeconomic History   Marital status: Divorced    Spouse name: Not on file   Number of children: Not on file   Years of education: Not on file   Highest education level: Not on file  Occupational History    Comment: Supervisor reservations; 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[employer]  Tobacco Use   Smoking status: Never   Smokeless tobacco: Never   Tobacco comments:    Vaps daily   Vaping Use   Vaping Use: Every day   Substances: Nicotine  Substance and Sexual Activity   Alcohol use: Yes    Alcohol/week: 1.0 standard drink of alcohol    Types: 1 Standard drinks or equivalent per week    Comment: reports one a week   Drug use: Never   Sexual activity: Not on file  Other Topics Concern   Not on file  Social History Narrative   Not  on file   Social Determinants of Health   Financial Resource Strain: Not on file  Food Insecurity: Not on file  Transportation Needs: Not on file  Physical Activity: Not on file  Stress: Not on file  Social Connections: Not on file    Allergies: No Known Allergies  Metabolic Disorder Labs: Lab Results  Component Value Date   HGBA1C 4.4 (L) 07/09/2020   MPG 79.58 07/09/2020   No results found for: "PROLACTIN" Lab Results  Component Value Date   CHOL 131 07/09/2020   TRIG 143 07/09/2020   HDL 59 07/09/2020   CHOLHDL 2.2 07/09/2020   VLDL 29 07/09/2020   LDLCALC 43 07/09/2020   Lab Results  Component Value Date   TSH 2.264 07/09/2020   TSH 2.210 05/01/2018  Therapeutic Level Labs: No results found for: "LITHIUM" No results found for: "VALPROATE" No results found for: "CBMZ"  Current Medications: Current Outpatient Medications  Medication Sig Dispense Refill   amphetamine-dextroamphetamine (ADDERALL XR) 10 MG 24 hr capsule Take 1 capsule (10 mg total) by mouth daily. 30 capsule 0   DULoxetine (CYMBALTA) 30 MG capsule Take 1 capsule (30 mg total) by mouth daily. For depression 90 capsule 1   gabapentin (NEURONTIN) 100 MG capsule Take 1 capsule (100 mg total) by mouth 3 (three) times daily. 270 capsule 1   Lurasidone HCl 60 MG TABS Take 1 tablet (60 mg total) by mouth daily with breakfast. 90 tablet 1   ondansetron (ZOFRAN) 4 MG tablet Take 1 tablet (4 mg total) by mouth daily as needed for nausea or vomiting. 30 tablet 0   traZODone (DESYREL) 50 MG tablet TAKE 1 TABLET BY MOUTH AT BEDTIME AS NEEDED FOR SLEEP. 90 tablet 1   No current facility-administered medications for this visit.     Musculoskeletal: Strength & Muscle Tone: within normal limits Gait & Station: normal Patient leans: N/A  Psychiatric Specialty Exam: Review of Systems  Psychiatric/Behavioral:  Positive for decreased concentration. Negative for dysphoric mood, hallucinations, self-injury,  sleep disturbance and suicidal ideas. The patient is not nervous/anxious and is not hyperactive.     unknown if currently breastfeeding.There is no height or weight on file to calculate BMI.  General Appearance: Casual  Eye Contact:  Good  Speech:  Clear and Coherent and Normal Rate  Volume:  Normal  Mood:  Anxious and Euthymic  Affect:  Appropriate and Congruent  Thought Process:  Coherent, Goal Directed, and Descriptions of Associations: Intact  Orientation:  Full (Time, Place, and Person)  Thought Content: WDL   Suicidal Thoughts:  No  Homicidal Thoughts:  No  Memory:  Immediate;   Good Recent;   Good Remote;   Good  Judgement:  Good  Insight:  Good and Fair  Psychomotor Activity:  Normal  Concentration:  Concentration: Good and Attention Span: Good  Recall:  Good  Fund of Knowledge: Good  Language: Good  Akathisia:  No  Handed:  Right  AIMS (if indicated): not done  Assets:  Communication Skills Desire for Improvement Financial Resources/Insurance Housing Vocational/Educational  ADL's:  Intact  Cognition: WNL  Sleep:  Good   Screenings: AIMS    Flowsheet Row [facility] (Dis[facility]) [facility]  AIMS Total Score 0      AUDIT    Flowsheet Row [facility]) [facility] Admission (Discharged) [facility]  Alcohol Use Disorder Identification Test Final Score (AUDIT) 2 3      GAD-7    Flowsheet Row Video Visit from 06/16/2022 in [facility] Video Visit from 04/14/2022 in [facility] Video Visit from 02/17/2022 in [facility] Video Visit from 11/18/2021 in [facility] Video Visit from 10/13/2021 in [facility]  Total GAD-7 Score 1 2 12 10  3       PHQ2-9    Flowsheet Row Video Visit from 06/16/2022 in [facility] Video Visit from 04/14/2022 in [facility] Video Visit from 02/17/2022 in [facility] Video Visit from 11/18/2021 in [facility] Video Visit from 10/13/2021  in [facility]  PHQ-2 Total Score 1 3 5 6 2   PHQ-9 Total Score -- 9 16 23 6       Flowsheet Row Video Visit from 06/16/2022 in [facility] Video Visit from 04/14/2022 in [facility] Video Visit from 02/17/2022 in [facility]  C-SSRS RISK CATEGORY Low Risk Low Risk Low Risk        Assessment and Plan:   02/19/2022 "Wanda Torres" is a 31 year old female with a past psychiatric history significant for generalized anxiety disorder, bipolar disorder, and insomnia who presents to [facility] via virtual video visit for follow-up and medication management.  Patient reports that the majority of her medications have been helpful in managing her symptoms.  She reports that she stopped taking her Vyvanse due to the ineffectiveness in the management of her ADHD symptoms.  Since discontinuing Vyvanse, patient states that she has been having trouble with concentration and is unable to sit down to do anything.  Provider discussed having patient be placed on Adderall extended release 10 mg daily for the management of her ADHD in addition to her other medications.  Patient was agreeable to recommendation.  Patient's medications to be prescribed to pharmacy of choice.  Collaboration of Care: Collaboration of Care: Medication Management AEB provider managing patient's psychiatric medications and Psychiatrist AEB patient being followed by a mental health provider  Patient/Guardian was advised Release of Information  must be obtained prior to any record release in order to collaborate their care with an outside provider. Patient/Guardian was advised if they have not already done so to contact the registration department to sign all necessary forms in order for 38 to release information regarding their care.   Consent: Patient/Guardian gives verbal consent for treatment and assignment of benefits for services provided during this visit. Patient/Guardian expressed understanding and agreed to proceed.   1. Bipolar disorder, current episode mixed, moderate (HCC)  - Lurasidone HCl 60 MG TABS; Take 1 tablet (60 mg total) by mouth daily with breakfast.  Dispense: 90 tablet; Refill: 1 - DULoxetine (CYMBALTA) 30 MG capsule; Take 1 capsule (30 mg total) by mouth daily. For depression  Dispense: 90 capsule; Refill: 1  2. Generalized anxiety disorder  - DULoxetine (CYMBALTA) 30 MG capsule; Take 1 capsule (30 mg total) by mouth daily. For depression  Dispense: 90 capsule; Refill: 1 - gabapentin (NEURONTIN) 100 MG capsule; Take 1 capsule (100 mg total) by mouth 3 (three) times daily.  Dispense: 270 capsule; Refill: 1  3. Attention deficit hyperactivity disorder (ADHD), predominantly inattentive type  - amphetamine-dextroamphetamine (ADDERALL XR) 10 MG 24 hr capsule; Take 1 capsule (10 mg total) by mouth daily.  Dispense: 30 capsule; Refill: 0  Patient to follow up in 2 months Provider spent a total of 17 minutes with the patient/reviewing the patient's chart  [facility], PA 04/14/2022, 11:51 AM

## 2022-06-21 ENCOUNTER — Telehealth (HOSPITAL_COMMUNITY): Payer: Self-pay | Admitting: *Deleted

## 2022-06-23 ENCOUNTER — Other Ambulatory Visit (HOSPITAL_COMMUNITY): Payer: Self-pay | Admitting: Psychiatry

## 2022-06-23 ENCOUNTER — Telehealth (HOSPITAL_COMMUNITY): Payer: Self-pay | Admitting: *Deleted

## 2022-06-23 DIAGNOSIS — F900 Attention-deficit hyperactivity disorder, predominantly inattentive type: Secondary | ICD-10-CM

## 2022-06-23 MED ORDER — AMPHETAMINE-DEXTROAMPHET ER 10 MG PO CP24: 10.0000 mg | ORAL_CAPSULE | Freq: Every day | ORAL | 0 refills | Status: DC

## 2022-06-23 NOTE — Telephone Encounter (Signed)
Otila Back Out of Office Sending Refill Request to Dr Burtis Junes  Patient called requested script for amphetamine-dextroamphetamine (ADDERALL XR) 10 MG 24 hr capsule  be sent to : [id number] - [place], [place] - [address]

## 2022-06-23 NOTE — Telephone Encounter (Signed)
Medication refilled and sent to preferred pharmacy

## 2022-07-02 ENCOUNTER — Telehealth (HOSPITAL_COMMUNITY): Payer: Self-pay | Admitting: *Deleted

## 2022-07-02 NOTE — Telephone Encounter (Signed)
EDDIE NWOKO IS OUT OF OFFICE REFILL REQUEST SENT TO Dr B PARSONS  Patient called stated her medication recently increased to amphetamine-dextroamphetamine (ADDERALL XR) 10 MG 24 hr capsule Take 1 capsule (10 mg total) by mouth daily   Stated it's not helping & asked for medication to be increased

## 2022-07-05 NOTE — Telephone Encounter (Signed)
Patient is not due for refills until 07/23/2022. Provider informed patient of this and told her that refills or increase would not occur today. Provider encouraged patient to discuss increase with her primary psychiatric provider at her next visit or when refills are due. She endorsed understanding and agreed. No other concerns noted at this time.

## 2022-07-16 ENCOUNTER — Telehealth (HOSPITAL_COMMUNITY): Payer: Self-pay | Admitting: *Deleted

## 2022-07-16 NOTE — Telephone Encounter (Signed)
VM requesting a new rx for her adderall but feels the 10 mg XR is not strong enough to manage her lack of focus and would like an increase. She saw Link Snuffer PA last on 06/16/22 and has no future appt. Link Snuffer is out of this office for several more weeks. I will forward this request to Dr Morrie Sheldon who is covering for him in the meantime and ask front desk to call her to schedule her a future appt.

## 2022-07-19 ENCOUNTER — Telehealth (HOSPITAL_COMMUNITY): Payer: Self-pay | Admitting: *Deleted

## 2022-07-19 NOTE — Telephone Encounter (Signed)
Otila Back is out of office Sending Refill Request to Dr Antony Salmon  Patient called LVM requested refill on amphetamine-dextroamphetamine (ADDERALL XR) 10 MG 24 hr capsule Last Appt was on 06/16/22

## 2022-07-19 NOTE — Telephone Encounter (Signed)
She will need to be seen by a provider before getting a prescription.

## 2022-07-20 ENCOUNTER — Other Ambulatory Visit (HOSPITAL_COMMUNITY): Payer: Self-pay | Admitting: Student in an Organized Health Care Education/Training Program

## 2022-07-20 ENCOUNTER — Telehealth (HOSPITAL_COMMUNITY): Payer: Self-pay | Admitting: *Deleted

## 2022-07-20 DIAGNOSIS — F900 Attention-deficit hyperactivity disorder, predominantly inattentive type: Secondary | ICD-10-CM

## 2022-07-20 MED ORDER — AMPHETAMINE-DEXTROAMPHET ER 10 MG PO CP24: 10.0000 mg | ORAL_CAPSULE | Freq: Every day | ORAL | 0 refills | Status: DC

## 2022-07-20 NOTE — Telephone Encounter (Signed)
Otila Back is out of office Sending Refill Request to Dr Antony Salmon   Patient called LVM requested refill on amphetamine-dextroamphetamine (ADDERALL XR) 10 MG 24 hr capsule Patient Just Seen--Appt was on 06/16/22

## 2022-07-27 HISTORY — PX: COLPOSCOPY: SHX161

## 2022-08-04 ENCOUNTER — Encounter (HOSPITAL_COMMUNITY): Payer: Self-pay | Admitting: Physician Assistant

## 2022-08-04 ENCOUNTER — Telehealth (INDEPENDENT_AMBULATORY_CARE_PROVIDER_SITE_OTHER): Payer: Medicaid Other | Admitting: Physician Assistant

## 2022-08-04 DIAGNOSIS — F900 Attention-deficit hyperactivity disorder, predominantly inattentive type: Secondary | ICD-10-CM

## 2022-08-04 DIAGNOSIS — F411 Generalized anxiety disorder: Secondary | ICD-10-CM | POA: Diagnosis not present

## 2022-08-04 DIAGNOSIS — F3162 Bipolar disorder, current episode mixed, moderate: Secondary | ICD-10-CM | POA: Diagnosis not present

## 2022-08-04 MED ORDER — AMPHETAMINE-DEXTROAMPHET ER 20 MG PO CP24: 20.0000 mg | ORAL_CAPSULE | Freq: Every day | ORAL | 0 refills | Status: DC

## 2022-08-04 MED ORDER — DULOXETINE HCL 30 MG PO CPEP: 30.0000 mg | ORAL_CAPSULE | Freq: Every day | ORAL | 1 refills | Status: DC

## 2022-08-04 NOTE — Progress Notes (Unsigned)
BH MD/PA/NP OP Progress Note  Virtual Visit via Video Note  I connected with Wanda Torres on 08/03/22 at 10:30 AM EDT by a video enabled telemedicine application and verified that I am speaking with the correct person using two identifiers.  Location: Patient: Home Provider: Clinic   I discussed the limitations of evaluation and management by telemedicine and the availability of in person appointments. The patient expressed understanding and agreed to proceed.  Follow Up Instructions:   I discussed the assessment and treatment plan with the patient. The patient was provided an opportunity to ask questions and all were answered. The patient agreed with the plan and demonstrated an understanding of the instructions.   The patient was advised to call back or seek an in-person evaluation if the symptoms worsen or if the condition fails to improve as anticipated.  I provided 11 minutes of non-face-to-face time during this encounter.  Malachy Mood, PA    08/03/2022 10:59 AM Wanda Torres  MRN:  [id number]  Chief Complaint:  Chief Complaint  Patient presents with   Follow-up   Medication Management   HPI:   Wanda Torres "Wanda Torres" is a 31 year old female with a past psychiatric history significant for generalized anxiety disorder, bipolar disorder, and insomnia who presents to [facility] via virtual video visit for follow-up and medication management.  Patient is currently being managed on the following medications:  Gabapentin 100 mg 3 times daily Duloxetine 30 mg daily Lurasidone 60 mg daily Adderall XR 10 mg daily Trazodone 50 mg at bedtime  Patient presents to the clinic requesting that her Adderall be adjusted.  Patient states that whenever she takes her Adderall she feels that she is not taking anything.  Patient still endorses issues with focusing.  She states that she often time thinks about all the things that  she has to do and is unable to get any of those activities done.  Patient believes that she has focus within the first hour of taking her medication, but after that hour, patient is unable to get anything done.  Patient endorses lack of motivation, decreased energy, and poor time management.   Patient denies any other issues regarding her other medications.  Patient denies depression.  Patient endorses some anxiety and attributes her anxiety due to the inability to focus.  Patient denies any other issues regarding her mental health.  A GAD-7 screen was performed with the patient scoring a 4.  Patient is alert and oriented x4, pleasant, calm, cooperative, and fully engaged in conversation during the encounter.  Patient endorses pretty good mood.  Patient denies suicidal or homicidal ideations.  She further denies auditory or visual hallucinations and does not appear to be responding to internal/external stimuli.  Patient endorses good sleep and receives on average 9 hours of sleep each night.  Patient endorses good appetite and eats on average 2-3 meals per day.  Patient endorses some alcohol consumption.  Patient denies tobacco use but does endorse engaging in [place].  Patient denies illicit drug use.  Visit Diagnosis:    ICD-10-CM   1. Attention deficit hyperactivity disorder (ADHD), predominantly inattentive type  F90.0 amphetamine-dextroamphetamine (ADDERALL XR) 20 MG 24 hr capsule    2. Bipolar disorder, current episode mixed, moderate (HCC)  F31.62 DULoxetine (CYMBALTA) 30 MG capsule    3. Generalized anxiety disorder  F41.1 DULoxetine (CYMBALTA) 30 MG capsule      Past Psychiatric History:  Bipolar Disorder ADHD Insomnia Generalized  anxiety disorder  Past Medical History:  Past Medical History:  Diagnosis Date   Acne    Allergy    Anemia    Anxiety    Depression    Psoriasis    Supervision of other high risk pregnancy, antepartum 01/19/2019   Formatting of this note might be  different from the original. Partner prefers She/Her pronouns   History reviewed. No pertinent surgical history.  Family Psychiatric History:  N/A  Family History:  Family History  Problem Relation Age of Onset   Anxiety disorder Mother    Depression Mother    Sexual abuse Mother    Drug abuse Father    ADD / ADHD Sister    Anxiety disorder Sister    Bipolar disorder Sister    Depression Sister    Drug abuse Sister    Sexual abuse Sister    Anxiety disorder Brother    Depression Brother    Sexual abuse Brother    Alcohol abuse Maternal Aunt    Bipolar disorder Maternal Aunt    Depression Maternal Aunt    Sexual abuse Maternal Aunt    ADD / ADHD Maternal Uncle    Alcohol abuse Maternal Uncle    Drug abuse Maternal Uncle    Sexual abuse Maternal Uncle    Alcohol abuse Maternal Grandfather    Dementia Maternal Grandfather    Depression Maternal Grandfather    Sexual abuse Maternal Grandfather    Depression Maternal Grandmother    Sexual abuse Maternal Grandmother    Bipolar disorder Maternal Aunt    Depression Maternal Aunt    Sexual abuse Maternal Aunt     Social History:  Social History   Socioeconomic History   Marital status: Divorced    Spouse name: Not on file   Number of children: Not on file   Years of education: Not on file   Highest education level: Not on file  Occupational History    Comment: Supervisor reservations; 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[employer]  Tobacco Use   Smoking status: Never   Smokeless tobacco: Never   Tobacco comments:    Vaps daily   Vaping Use   Vaping Use: Every day   Substances: Nicotine  Substance and Sexual Activity   Alcohol use: Yes    Alcohol/week: 1.0 standard drink of alcohol    Types: 1 Standard drinks or equivalent per week    Comment: reports one a week   Drug use: Never   Sexual activity: Not on file  Other Topics Concern   Not on file  Social History Narrative   Not on file   Social Determinants of Health    Financial Resource Strain: Not on file  Food Insecurity: Not on file  Transportation Needs: Not on file  Physical Activity: Not on file  Stress: Not on file  Social Connections: Not on file    Allergies: No Known Allergies  Metabolic Disorder Labs: Lab Results  Component Value Date   HGBA1C 4.4 (L) 07/09/2020   MPG 79.58 07/09/2020   No results found for: "PROLACTIN" Lab Results  Component Value Date   CHOL 131 07/09/2020   TRIG 143 07/09/2020   HDL 59 07/09/2020   CHOLHDL 2.2 07/09/2020   VLDL 29 07/09/2020   LDLCALC 43 07/09/2020   Lab Results  Component Value Date   TSH 2.264 07/09/2020   TSH 2.210 05/01/2018    Therapeutic Level Labs: No results found for: "LITHIUM" No results found for: "VALPROATE" No results found for: "  CBMZ"  Current Medications: Current Outpatient Medications  Medication Sig Dispense Refill   amphetamine-dextroamphetamine (ADDERALL XR) 20 MG 24 hr capsule Take 1 capsule (20 mg total) by mouth daily. 30 capsule 0   DULoxetine (CYMBALTA) 30 MG capsule Take 1 capsule (30 mg total) by mouth daily. For depression 90 capsule 1   gabapentin (NEURONTIN) 100 MG capsule Take 1 capsule (100 mg total) by mouth 3 (three) times daily. 270 capsule 1   Lurasidone HCl 60 MG TABS Take 1 tablet (60 mg total) by mouth daily with breakfast. 90 tablet 1   ondansetron (ZOFRAN) 4 MG tablet Take 1 tablet (4 mg total) by mouth daily as needed for nausea or vomiting. 30 tablet 0   traZODone (DESYREL) 50 MG tablet TAKE 1 TABLET BY MOUTH AT BEDTIME AS NEEDED FOR SLEEP. 90 tablet 1   No current facility-administered medications for this visit.     Musculoskeletal: Strength & Muscle Tone: within normal limits Gait & Station: normal Patient leans: N/A  Psychiatric Specialty Exam: Review of Systems  Psychiatric/Behavioral:  Positive for decreased concentration. Negative for dysphoric mood, hallucinations, self-injury, sleep disturbance and suicidal ideas. The  patient is nervous/anxious. The patient is not hyperactive.     unknown if currently breastfeeding.There is no height or weight on file to calculate BMI.  General Appearance: Casual  Eye Contact:  Good  Speech:  Clear and Coherent and Normal Rate  Volume:  Normal  Mood:  Anxious and Euthymic  Affect:  Appropriate and Congruent  Thought Process:  Coherent, Goal Directed, and Descriptions of Associations: Intact  Orientation:  Full (Time, Place, and Person)  Thought Content: WDL   Suicidal Thoughts:  No  Homicidal Thoughts:  No  Memory:  Immediate;   Good Recent;   Good Remote;   Good  Judgement:  Good  Insight:  Good and Fair  Psychomotor Activity:  Normal  Concentration:  Concentration: Good and Attention Span: Good  Recall:  Good  Fund of Knowledge: Good  Language: Good  Akathisia:  No  Handed:  Right  AIMS (if indicated): not done  Assets:  Communication Skills Desire for Improvement Financial Resources/Insurance Housing Vocational/Educational  ADL's:  Intact  Cognition: WNL  Sleep:  Good   Screenings: AIMS    Flowsheet Row Admission (Discharged) from OP Visit from 07/09/2020 in [place] [facility]  AIMS Total Score 0      AUDIT    Flowsheet Row Admission (Discharged) from 09/16/2020 in [place] [facility] Admission (Discharged) from OP Visit from 07/09/2020 in [place] [facility]  Alcohol Use Disorder Identification Test Final Score (AUDIT) 2 3      GAD-7    Flowsheet Row Video Visit from 08/04/2022 in [facility] Video Visit from 06/16/2022 in [facility] Video Visit from 04/14/2022 in [facility] Video Visit from 02/17/2022 in [facility] Video Visit from 11/18/2021 in [facility]  Total GAD-7 Score 4 1 2 12 10       PHQ2-9    Flowsheet Row  Video Visit from 08/04/2022 in [facility] Video Visit from 06/16/2022 in [facility] Video Visit from 04/14/2022 in [facility] Video Visit from 02/17/2022 in [facility] Video Visit from 11/18/2021 in [facility]  PHQ-2 Total Score 1 1 3 5 6   PHQ-9  Total Score -- -- 9 16 23       Flowsheet Row Video Visit from 08/04/2022 in [facility] Video Visit from 06/16/2022 in [facility] Video Visit from 04/14/2022 in [facility]  C-SSRS RISK CATEGORY Low Risk Low Risk Low Risk        Assessment and Plan:   [facility] "Wanda Torres" is a 31 year old female with a past psychiatric history significant for generalized anxiety disorder, bipolar disorder, and insomnia who presents to [facility] via virtual video visit for follow-up and medication management.  Patient presents today with continuous issues with focusing, lack of motivation, decreased energy, and poor time management skills.  Patient attributes her symptoms to the ineffectiveness of her Adderall.  Provider recommended increasing patient's Adderall dosage from 10 mg to 20 mg daily for the management of her symptoms.  Patient was agreeable to recommendations.  Patient denies any other symptoms at this time.  Patient's medications to be prescribed to pharmacy of choice.  Collaboration of Care: Collaboration of Care: Medication Management AEB provider managing patient's psychiatric medications and Psychiatrist AEB patient being followed by a mental health provider  Patient/Guardian was advised Release of Information must be obtained prior to any record release in order to collaborate their care with an outside provider. Patient/Guardian was advised if they have not already done so to  contact the registration department to sign all necessary forms in order for [facility] to release information regarding their care.   Consent: Patient/Guardian gives verbal consent for treatment and assignment of benefits for services provided during this visit. Patient/Guardian expressed understanding and agreed to proceed.   1. Attention deficit hyperactivity disorder (ADHD), predominantly inattentive type  - amphetamine-dextroamphetamine (ADDERALL XR) 20 MG 24 hr capsule; Take 1 capsule (20 mg total) by mouth daily.  Dispense: 30 capsule; Refill: 0  2. Bipolar disorder, current episode mixed, moderate (HCC) Patient to continue taking lurasidone 60 mg daily for the management of her bipolar disorder  - DULoxetine (CYMBALTA) 30 MG capsule; Take 1 capsule (30 mg total) by mouth daily. For depression  Dispense: 90 capsule; Refill: 1  3. Generalized anxiety disorder Patient to continue taking gabapentin 100 mg 3 times daily for the management of her generalized anxiety disorder  - DULoxetine (CYMBALTA) 30 MG capsule; Take 1 capsule (30 mg total) by mouth daily. For depression  Dispense: 90 capsule; Refill: 1  Patient to follow up in 2 months Provider spent a total of 11 minutes with the patient/reviewing the patient's chart  [place], PA 08/03/2022, 10:59 AM

## 2022-08-05 ENCOUNTER — Telehealth (HOSPITAL_COMMUNITY): Payer: Self-pay | Admitting: *Deleted

## 2022-08-05 ENCOUNTER — Other Ambulatory Visit (HOSPITAL_COMMUNITY): Payer: Self-pay | Admitting: Student in an Organized Health Care Education/Training Program

## 2022-08-05 DIAGNOSIS — F411 Generalized anxiety disorder: Secondary | ICD-10-CM

## 2022-08-05 DIAGNOSIS — F900 Attention-deficit hyperactivity disorder, predominantly inattentive type: Secondary | ICD-10-CM

## 2022-08-05 DIAGNOSIS — F3162 Bipolar disorder, current episode mixed, moderate: Secondary | ICD-10-CM

## 2022-08-05 MED ORDER — DULOXETINE HCL 30 MG PO CPEP: 30.0000 mg | ORAL_CAPSULE | Freq: Every day | ORAL | 1 refills | Status: DC

## 2022-08-05 MED ORDER — AMPHETAMINE-DEXTROAMPHET ER 20 MG PO CP24: 20.0000 mg | ORAL_CAPSULE | Freq: Every day | ORAL | 0 refills | Status: DC

## 2022-08-05 NOTE — Progress Notes (Signed)
This provider confirmed that patient's pharmacy is temporarily closed and sent her Adderall - XR 20mg  and Duloxetine 30mg  to different preferred pharmacy. PDMP indicates patient did not pick up Adderall prior to pharmacy temporarily closing.   PGY-3 , MD

## 2022-08-05 NOTE — Telephone Encounter (Signed)
Done

## 2022-08-05 NOTE — Telephone Encounter (Signed)
Patient had appointment on 8/9 with [facility]  & stated that her Rx is temporarily closed . And asked if her   amphetamine-dextroamphetamine (ADDERALL XR) 20 MG 24 hr capsule                          && DULoxetine (CYMBALTA) 30 MG capsule  Could be Sent to : [facility] [address] [place] 

## 2022-09-02 ENCOUNTER — Telehealth (HOSPITAL_COMMUNITY): Payer: Self-pay | Admitting: *Deleted

## 2022-09-02 NOTE — Telephone Encounter (Signed)
Patient Called Requested Refill -- Stated she's going out of town this weekend amphetamine-dextroamphetamine (ADDERALL XR) 20 MG 24 hr capsule

## 2022-09-08 ENCOUNTER — Telehealth (HOSPITAL_COMMUNITY): Payer: Self-pay

## 2022-09-08 NOTE — Telephone Encounter (Signed)
Please refer this concern to Dr. Eliseo Gum

## 2022-09-14 ENCOUNTER — Telehealth (INDEPENDENT_AMBULATORY_CARE_PROVIDER_SITE_OTHER): Payer: Medicaid Other | Admitting: Physician Assistant

## 2022-09-14 ENCOUNTER — Encounter (HOSPITAL_COMMUNITY): Payer: Self-pay | Admitting: Physician Assistant

## 2022-09-14 DIAGNOSIS — F900 Attention-deficit hyperactivity disorder, predominantly inattentive type: Secondary | ICD-10-CM

## 2022-09-14 DIAGNOSIS — G4700 Insomnia, unspecified: Secondary | ICD-10-CM

## 2022-09-14 DIAGNOSIS — F411 Generalized anxiety disorder: Secondary | ICD-10-CM

## 2022-09-14 DIAGNOSIS — F3162 Bipolar disorder, current episode mixed, moderate: Secondary | ICD-10-CM

## 2022-09-14 MED ORDER — LURASIDONE HCL 60 MG PO TABS: 60.0000 mg | ORAL_TABLET | Freq: Every day | ORAL | 1 refills | Status: DC

## 2022-09-14 MED ORDER — AMPHETAMINE-DEXTROAMPHET ER 20 MG PO CP24: 20.0000 mg | ORAL_CAPSULE | Freq: Every day | ORAL | 0 refills | Status: DC

## 2022-09-14 MED ORDER — TRAZODONE HCL 50 MG PO TABS: 50.0000 mg | ORAL_TABLET | Freq: Every evening | ORAL | 1 refills | Status: DC | PRN

## 2022-09-14 MED ORDER — DULOXETINE HCL 30 MG PO CPEP: 30.0000 mg | ORAL_CAPSULE | Freq: Every day | ORAL | 1 refills | Status: DC

## 2022-09-14 MED ORDER — GABAPENTIN 100 MG PO CAPS: 100.0000 mg | ORAL_CAPSULE | Freq: Three times a day (TID) | ORAL | 1 refills | Status: DC

## 2022-09-14 NOTE — Progress Notes (Signed)
BH MD/PA/NP OP Progress Note  Virtual Visit via Video Note  I connected with Wanda Torres on 09/14/22 at  2:00 PM EDT by a video enabled telemedicine application and verified that I am speaking with the correct person using two identifiers.  Location: Patient: Home Provider: Clinic   I discussed the limitations of evaluation and management by telemedicine and the availability of in person appointments. The patient expressed understanding and agreed to proceed.  Follow Up Instructions:   I discussed the assessment and treatment plan with the patient. The patient was provided an opportunity to ask questions and all were answered. The patient agreed with the plan and demonstrated an understanding of the instructions.   The patient was advised to call back or seek an in-person evaluation if the symptoms worsen or if the condition fails to improve as anticipated.  I provided 23 minutes of non-face-to-face time during this encounter.  Malachy Mood, PA    09/14/2022 2:27 PM Wanda Torres  MRN:  [id number]  Chief Complaint:  Chief Complaint  Patient presents with   Medication Refill   Follow-up   HPI:   Wanda Torres "Wanda Torres" is a 31 year old female with a past psychiatric history significant for generalized anxiety disorder, bipolar disorder, and insomnia who presents to [facility] via virtual video visit for follow-up and medication management.  Patient is currently being managed on the following medications:  Gabapentin 100 mg 3 times daily Duloxetine 30 mg daily Lurasidone 60 mg daily Adderall XR 20 mg daily Trazodone 50 mg at bedtime  Patient presents to the encounter with questions regarding her Adderall prescription.  She notes that the medication's efficacy starts to wane towards the end of the day.  She notes that the medication appears to be wearing off quicker than her previous Vyvanse prescription; however,  her current Adderall prescription is way more effective.  Patient denies experiencing any adverse side effects from taking Adderall and states that she has been able to take on more tasks during her day.  Patient reports that she has been finding it difficult to take her gabapentin as scheduled.  She notes that she occasionally forgets her afternoon dose of gabapentin and often finds herself taking her second gabapentin dose a few hours before her third dose.  Divider assured patient that there should be no added adverse side effects from taking her gabapentin doses closely together.  Patient also informed provider that she has relinquished her medications to her aunt and boyfriend and states that they are now responsible for managing and delivering her medications to her.  Patient felt that this step was necessary due to flushing her medications down the toilet in an effort to not take them.  Since relinquishing her medications to her aunt and boyfriend, patient has been taking her medications accordingly.  Patient denies experiencing any depressive symptoms and further denies anxiety at this time.  Patient's current stressor involves the ongoing custody battle for her eldest child.  Patient notes that she was recently extremely stressed around the time of her last court date.  A GAD-7 screen was performed the patient scoring a 5.  A [place] Suicide Severity Rating Scale was performed the patient being considered moderate risk.  Patient denies being a danger to herself stating that her past suicidal thoughts were attributed to her upcoming court date.  Patient is able to contract for safety following the conclusion of the encounter.  Patient is alert and oriented  x4, pleasant, calm, cooperative, and fully engaged in conversation during the encounter.  Patient endorses good mood.  Patient denies suicidal or homicidal ideations.  She further denies auditory or visual hallucinations and does not appear to be  responding to internal/external stimuli.  Patient endorses good sleep and receives on average 9 to 10 hours of sleep each night.  Patient endorses good appetite and eats on average 2 meals per day.  Patient denies alcohol consumption.  Patient denies tobacco use but does engage in vaping.  Patient denies illicit drug use.  Visit Diagnosis:    ICD-10-CM   1. Attention deficit hyperactivity disorder (ADHD), predominantly inattentive type  F90.0 amphetamine-dextroamphetamine (ADDERALL XR) 20 MG 24 hr capsule    amphetamine-dextroamphetamine (ADDERALL XR) 20 MG 24 hr capsule    amphetamine-dextroamphetamine (ADDERALL XR) 20 MG 24 hr capsule    2. Bipolar disorder, current episode mixed, moderate (HCC)  F31.62 DULoxetine (CYMBALTA) 30 MG capsule    Lurasidone HCl 60 MG TABS    3. Generalized anxiety disorder  F41.1 DULoxetine (CYMBALTA) 30 MG capsule    gabapentin (NEURONTIN) 100 MG capsule    4. Insomnia, unspecified type  G47.00 traZODone (DESYREL) 50 MG tablet      Past Psychiatric History:  Bipolar Disorder ADHD Insomnia Generalized anxiety disorder  Past Medical History:  Past Medical History:  Diagnosis Date   Acne    Allergy    Anemia    Anxiety    Depression    Psoriasis    Supervision of other high risk pregnancy, antepartum 01/19/2019   Formatting of this note might be different from the original. Partner prefers She/Her pronouns   History reviewed. No pertinent surgical history.  Family Psychiatric History:  N/A  Family History:  Family History  Problem Relation Age of Onset   Anxiety disorder Mother    Depression Mother    Sexual abuse Mother    Drug abuse Father    ADD / ADHD Sister    Anxiety disorder Sister    Bipolar disorder Sister    Depression Sister    Drug abuse Sister    Sexual abuse Sister    Anxiety disorder Brother    Depression Brother    Sexual abuse Brother    Alcohol abuse Maternal Aunt    Bipolar disorder Maternal Aunt    Depression  Maternal Aunt    Sexual abuse Maternal Aunt    ADD / ADHD Maternal Uncle    Alcohol abuse Maternal Uncle    Drug abuse Maternal Uncle    Sexual abuse Maternal Uncle    Alcohol abuse Maternal Grandfather    Dementia Maternal Grandfather    Depression Maternal Grandfather    Sexual abuse Maternal Grandfather    Depression Maternal Grandmother    Sexual abuse Maternal Grandmother    Bipolar disorder Maternal Aunt    Depression Maternal Aunt    Sexual abuse Maternal Aunt     Social History:  Social History   Socioeconomic History   Marital status: Divorced    Spouse name: Not on file   Number of children: Not on file   Years of education: Not on file   Highest education level: Not on file  Occupational History    Comment: Supervisor reservations; 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[employer]  Tobacco Use   Smoking status: Never   Smokeless tobacco: Never   Tobacco comments:    Vaps daily   Vaping Use   Vaping Use: Every day   Substances: Nicotine  Substance and Sexual Activity   Alcohol use: Yes    Alcohol/week: 1.0 standard drink of alcohol    Types: 1 Standard drinks or equivalent per week    Comment: reports one a week   Drug use: Never   Sexual activity: Not on file  Other Topics Concern   Not on file  Social History Narrative   Not on file   Social Determinants of Health   Financial Resource Strain: Not on file  Food Insecurity: Not on file  Transportation Needs: Not on file  Physical Activity: Not on file  Stress: Not on file  Social Connections: Not on file    Allergies: No Known Allergies  Metabolic Disorder Labs: Lab Results  Component Value Date   HGBA1C 4.4 (L) 07/09/2020   MPG 79.58 07/09/2020   No results found for: "PROLACTIN" Lab Results  Component Value Date   CHOL 131 07/09/2020   TRIG 143 07/09/2020   HDL 59 07/09/2020   CHOLHDL 2.2 07/09/2020   VLDL 29 07/09/2020   LDLCALC 43 07/09/2020   Lab Results  Component Value Date   TSH 2.264  07/09/2020   TSH 2.210 05/01/2018    Therapeutic Level Labs: No results found for: "LITHIUM" No results found for: "VALPROATE" No results found for: "CBMZ"  Current Medications: Current Outpatient Medications  Medication Sig Dispense Refill   [START ON 10/14/2022] amphetamine-dextroamphetamine (ADDERALL XR) 20 MG 24 hr capsule Take 1 capsule (20 mg total) by mouth daily. 30 capsule 0   [START ON 11/13/2022] amphetamine-dextroamphetamine (ADDERALL XR) 20 MG 24 hr capsule Take 1 capsule (20 mg total) by mouth daily. 30 capsule 0   amphetamine-dextroamphetamine (ADDERALL XR) 20 MG 24 hr capsule Take 1 capsule (20 mg total) by mouth daily. 30 capsule 0   DULoxetine (CYMBALTA) 30 MG capsule Take 1 capsule (30 mg total) by mouth daily. For depression 90 capsule 1   gabapentin (NEURONTIN) 100 MG capsule Take 1 capsule (100 mg total) by mouth 3 (three) times daily. 270 capsule 1   Lurasidone HCl 60 MG TABS Take 1 tablet (60 mg total) by mouth daily with breakfast. 90 tablet 1   ondansetron (ZOFRAN) 4 MG tablet Take 1 tablet (4 mg total) by mouth daily as needed for nausea or vomiting. 30 tablet 0   traZODone (DESYREL) 50 MG tablet Take 1 tablet (50 mg total) by mouth at bedtime as needed. for sleep 90 tablet 1   No current facility-administered medications for this visit.     Musculoskeletal: Strength & Muscle Tone: within normal limits Gait & Station: normal Patient leans: N/A  Psychiatric Specialty Exam: Review of Systems  Psychiatric/Behavioral:  Positive for decreased concentration. Negative for dysphoric mood, hallucinations, self-injury, sleep disturbance and suicidal ideas. The patient is not nervous/anxious and is not hyperactive.     unknown if currently breastfeeding.There is no height or weight on file to calculate BMI.  General Appearance: Casual  Eye Contact:  Good  Speech:  Clear and Coherent and Normal Rate  Volume:  Normal  Mood:  Euthymic  Affect:  Appropriate   Thought Process:  Coherent, Goal Directed, and Descriptions of Associations: Intact  Orientation:  Full (Time, Place, and Person)  Thought Content: WDL   Suicidal Thoughts:  No  Homicidal Thoughts:  No  Memory:  Immediate;   Good Recent;   Good Remote;   Good  Judgement:  Good  Insight:  Good and Fair  Psychomotor Activity:  Normal  Concentration:  Concentration: Good and Attention  Span: Good  Recall:  Good  Fund of Knowledge: Good  Language: Good  Akathisia:  No  Handed:  Right  AIMS (if indicated): not done  Assets:  Communication Skills Desire for Improvement Financial Resources/Insurance Housing Vocational/Educational  ADL's:  Intact  Cognition: WNL  Sleep:  Good   Screenings: AIMS    Flowsheet Row [facility] (Dis[facility]  AIMS Total Score 0      AUDIT    Flowsheet Row [facility]) [facility] Admission (Discharged) from [facility]  Alcohol Use Disorder Identification Test Final Score (AUDIT) 2 3      GAD-7    Flowsheet Row Video Visit from 09/14/2022 in [facility] Video Visit from 08/04/2022 in [facility] Video Visit from 06/16/2022 in [facility] Video Visit from 04/14/2022 in [facility] Video Visit from 02/17/2022 in [facility]  Total GAD-7 Score 5 4 1 2 12       PHQ2-9    Flowsheet Row Video Visit from 09/14/2022 in [facility] Video Visit from 08/04/2022 in [facility] Video Visit from 06/16/2022 in [facility] Video Visit from 04/14/2022 in [facility] Video Visit from 02/17/2022 in [facility]  PHQ-2 Total Score 1 1 1 3 5   PHQ-9 Total Score -- -- -- 9 16      Flowsheet Row Video Visit from 09/14/2022 in [facility] Video Visit from 08/04/2022 in [facility] Video Visit from 06/16/2022 in [facility]  C-SSRS RISK CATEGORY Moderate Risk Low Risk Low Risk        Assessment and Plan:   06/18/2022 "Wanda Torres" is a 31 year old female with a past psychiatric history significant for generalized anxiety disorder, bipolar disorder, and insomnia who presents to [facility] via virtual video visit for follow-up and medication management.  Patient reports that she has been experiencing perceived decreased effectiveness of her Adderall.  She reports that her Adderall prescription has been more useful than when she was on Vyvanse and she has been able to do many activities since being placed on the medication.  Provider informed patient to limit the amount of activities during the day to see if her Adderall is active in this increases.  Patient states that her aunt and boyfriend are now in charge of delivering her medications to her due to past episode of the patient flushing her medications down the toilet in an attempt to avoid taking the medication.  Since patient has relinquished control of her medications, patient has been taking her medications regularly.  Patient denies experiencing depressive symptoms nor does she endorse anxiety.  Patient to continue taking medications as prescribed.  Patient's medications to be e-prescribed to pharmacy of choice.  Collaboration of Care: Collaboration of Care: Medication Management AEB provider managing patient's psychiatric medications and Psychiatrist AEB patient being followed by a mental health provider  Patient/Guardian was advised Release of Information must be obtained prior to any record release in order  to collaborate their care with an outside provider. Patient/Guardian was advised if they have not already done so to contact the registration department to sign all necessary  forms in order for [place] to release information regarding their care.   Consent: Patient/Guardian gives verbal consent for treatment and assignment of benefits for services provided during this visit. Patient/Guardian expressed understanding and agreed to proceed.   1. Attention deficit hyperactivity disorder (ADHD), predominantly inattentive type  - amphetamine-dextroamphetamine (ADDERALL XR) 20 MG 24 hr capsule; Take 1 capsule (20 mg total) by mouth daily.  Dispense: 30 capsule; Refill: 0 - amphetamine-dextroamphetamine (ADDERALL XR) 20 MG 24 hr capsule; Take 1 capsule (20 mg total) by mouth daily.  Dispense: 30 capsule; Refill: 0 - amphetamine-dextroamphetamine (ADDERALL XR) 20 MG 24 hr capsule; Take 1 capsule (20 mg total) by mouth daily.  Dispense: 30 capsule; Refill: 0  2. Bipolar disorder, current episode mixed, moderate (HCC)  - DULoxetine (CYMBALTA) 30 MG capsule; Take 1 capsule (30 mg total) by mouth daily. For depression  Dispense: 90 capsule; Refill: 1 - Lurasidone HCl 60 MG TABS; Take 1 tablet (60 mg total) by mouth daily with breakfast.  Dispense: 90 tablet; Refill: 1  3. Generalized anxiety disorder  - DULoxetine (CYMBALTA) 30 MG capsule; Take 1 capsule (30 mg total) by mouth daily. For depression  Dispense: 90 capsule; Refill: 1 - gabapentin (NEURONTIN) 100 MG capsule; Take 1 capsule (100 mg total) by mouth 3 (three) times daily.  Dispense: 270 capsule; Refill: 1  4. Insomnia, unspecified type  - traZODone (DESYREL) 50 MG tablet; Take 1 tablet (50 mg total) by mouth at bedtime as needed. for sleep  Dispense: 90 tablet; Refill: 1  Patient to follow up in 2 months Provider spent a total of 23 minutes with the patient/reviewing the patient's chart  Meta Hatchet, PA 09/14/2022, 2:27 PM

## 2022-12-10 ENCOUNTER — Telehealth (HOSPITAL_COMMUNITY): Payer: Self-pay

## 2022-12-10 ENCOUNTER — Encounter (HOSPITAL_COMMUNITY): Payer: Self-pay | Admitting: Student in an Organized Health Care Education/Training Program

## 2022-12-10 ENCOUNTER — Telehealth (INDEPENDENT_AMBULATORY_CARE_PROVIDER_SITE_OTHER): Payer: Medicaid Other | Admitting: Student in an Organized Health Care Education/Training Program

## 2022-12-10 DIAGNOSIS — F3162 Bipolar disorder, current episode mixed, moderate: Secondary | ICD-10-CM

## 2022-12-10 DIAGNOSIS — F900 Attention-deficit hyperactivity disorder, predominantly inattentive type: Secondary | ICD-10-CM

## 2022-12-10 DIAGNOSIS — F411 Generalized anxiety disorder: Secondary | ICD-10-CM

## 2022-12-10 MED ORDER — GABAPENTIN 100 MG PO CAPS: 100.0000 mg | ORAL_CAPSULE | Freq: Three times a day (TID) | ORAL | 0 refills | Status: DC

## 2022-12-10 MED ORDER — DULOXETINE HCL 30 MG PO CPEP: 30.0000 mg | ORAL_CAPSULE | Freq: Every day | ORAL | 1 refills | Status: DC

## 2022-12-10 MED ORDER — AMPHETAMINE-DEXTROAMPHET ER 20 MG PO CP24: 20.0000 mg | ORAL_CAPSULE | Freq: Every day | ORAL | 0 refills | Status: DC

## 2022-12-10 MED ORDER — LURASIDONE HCL 80 MG PO TABS: 80.0000 mg | ORAL_TABLET | Freq: Every day | ORAL | 1 refills | Status: DC

## 2022-12-10 NOTE — Telephone Encounter (Signed)
Medication management - Telephone call with patient, after she had left a message to inform her that Dr. Renaldo Fiddler had sent in new order for her medications to her [facility] in [place] and if she wanted him to rewrite these on a prescription she would need to call back with that request so he would have time to do these for patient and to leave for her to pickup.

## 2022-12-10 NOTE — Progress Notes (Signed)
BH MD/PA/NP OP Progress Note   Virtual Visit via Video Note  I connected with Wanda Torres on 12/10/22 at 11:00 AM EST by a video enabled telemedicine application and verified that I am speaking with the correct person using two identifiers.  Location: Patient: Home Provider: [facility]   I discussed the limitations of evaluation and management by telemedicine and the availability of in person appointments. The patient expressed understanding and agreed to proceed.   12/10/2022 12:03 PM Wanda Torres  MRN:  [id number]  Chief Complaint:  Chief Complaint  Patient presents with   Follow-up   Manic Behavior   Depression   ADHD   HPI:  Wanda Torres is a 31 yr old female who presents via Virtual Video Visit for follow up and medication management.  PPHx is significant for Bipolar Disorder, GAD, ADHD, and 1 Suicide Attempt (cut self with knife- 06/2020) and 1 Psychiatric Hospitalization [facility]- 06/2020).  She reports that things have not been great since her last appointment.  She reports that she has had depressive episodes and manic episodes.  She reports that she has had 2 manic episodes surrounded by depressive episodes.  She reports that the manic episodes have lasted approximately 4 to 5 days and she felt "really up" and was not sleeping and got hyper fixated on things.  She reports that while she felt okay during a manic episodes the depressive episodes are really wearing on her.  She also reports that her current dose of Adderall is not sufficiently controlling her symptoms.  Discussed with her that given her mood instability making any changes to her Adderall would not be advised at this time.  Discussed further increasing her Latuda since she has had good response to it in the past versus making a change in medication and she was agreeable to this.  She reports that the gabapentin has helped with her anxiety, however, she reports the side effect of weight gain with it.   She reports that she does not want to change the medicine though because this is the only antianxiety medication that has been effective.  She reports that next week she is going to [place] to visit her brother.  Encouraged her to not take her Adderall next week since she will not be working to give the [place] a better chance of establishing mood stability.  Discussed that any stimulant medication can worsen mood stability and she reported understanding.  Discussed with her that taking a drug holiday from the Adderall might actually make her current dose more effective thereby making a dose change unnecessary.  She reports no SI, HI, or AVH.  She reports outside of the manic episodes her sleep is good.  She reports her appetite is doing good.  She reports no other concerns at present.  She will return for follow-up in approximately 4 weeks.   Visit Diagnosis:    ICD-10-CM   1. Bipolar disorder, current episode mixed, moderate (HCC)  F31.62 lurasidone (LATUDA) 80 MG TABS tablet    DULoxetine (CYMBALTA) 30 MG capsule    2. Generalized anxiety disorder  F41.1 DULoxetine (CYMBALTA) 30 MG capsule    gabapentin (NEURONTIN) 100 MG capsule    3. Attention deficit hyperactivity disorder (ADHD), predominantly inattentive type  F90.0 amphetamine-dextroamphetamine (ADDERALL XR) 20 MG 24 hr capsule      Past Psychiatric History: Bipolar Disorder, GAD, ADHD, and 1 Suicide Attempt (cut self with knife- 06/2020) and 1 Psychiatric Hospitalization [facility]- 06/2020).  Past Medical  History:  Past Medical History:  Diagnosis Date   Acne    Allergy    Anemia    Anxiety    Depression    Psoriasis    Supervision of other high risk pregnancy, antepartum 01/19/2019   Formatting of this note might be different from the original. Partner prefers She/Her pronouns   No past surgical history on file.  Family Psychiatric History: None  Family History:  Family History  Problem Relation Age of Onset   Anxiety disorder  Mother    Depression Mother    Sexual abuse Mother    Drug abuse Father    ADD / ADHD Sister    Anxiety disorder Sister    Bipolar disorder Sister    Depression Sister    Drug abuse Sister    Sexual abuse Sister    Anxiety disorder Brother    Depression Brother    Sexual abuse Brother    Alcohol abuse Maternal Aunt    Bipolar disorder Maternal Aunt    Depression Maternal Aunt    Sexual abuse Maternal Aunt    ADD / ADHD Maternal Uncle    Alcohol abuse Maternal Uncle    Drug abuse Maternal Uncle    Sexual abuse Maternal Uncle    Alcohol abuse Maternal Grandfather    Dementia Maternal Grandfather    Depression Maternal Grandfather    Sexual abuse Maternal Grandfather    Depression Maternal Grandmother    Sexual abuse Maternal Grandmother    Bipolar disorder Maternal Aunt    Depression Maternal Aunt    Sexual abuse Maternal Aunt     Social History:  Social History   Socioeconomic History   Marital status: Divorced    Spouse name: Not on file   Number of children: Not on file   Years of education: Not on file   Highest education level: Not on file  Occupational History    Comment: Supervisor reservations; 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[employer]  Tobacco Use   Smoking status: Never   Smokeless tobacco: Never   Tobacco comments:    Vaps daily   Vaping Use   Vaping Use: Every day   Substances: Nicotine  Substance and Sexual Activity   Alcohol use: Yes    Alcohol/week: 1.0 standard drink of alcohol    Types: 1 Standard drinks or equivalent per week    Comment: reports one a week   Drug use: Never   Sexual activity: Not on file  Other Topics Concern   Not on file  Social History Narrative   Not on file   Social Determinants of Health   Financial Resource Strain: Not on file  Food Insecurity: Not on file  Transportation Needs: Not on file  Physical Activity: Not on file  Stress: Not on file  Social Connections: Not on file    Allergies: No Known  Allergies  Metabolic Disorder Labs: Lab Results  Component Value Date   HGBA1C 4.4 (L) 07/09/2020   MPG 79.58 07/09/2020   No results found for: "PROLACTIN" Lab Results  Component Value Date   CHOL 131 07/09/2020   TRIG 143 07/09/2020   HDL 59 07/09/2020   CHOLHDL 2.2 07/09/2020   VLDL 29 07/09/2020   LDLCALC 43 07/09/2020   Lab Results  Component Value Date   TSH 2.264 07/09/2020   TSH 2.210 05/01/2018    Therapeutic Level Labs: No results found for: "LITHIUM" No results found for: "VALPROATE" No results found for: "CBMZ"  Current Medications: Current Outpatient  Medications  Medication Sig Dispense Refill   lurasidone (LATUDA) 80 MG TABS tablet Take 1 tablet (80 mg total) by mouth daily with breakfast. 30 tablet 1   amphetamine-dextroamphetamine (ADDERALL XR) 20 MG 24 hr capsule Take 1 capsule (20 mg total) by mouth daily. 30 capsule 0   amphetamine-dextroamphetamine (ADDERALL XR) 20 MG 24 hr capsule Take 1 capsule (20 mg total) by mouth daily. 30 capsule 0   [START ON 12/14/2022] amphetamine-dextroamphetamine (ADDERALL XR) 20 MG 24 hr capsule Take 1 capsule (20 mg total) by mouth daily. 30 capsule 0   DULoxetine (CYMBALTA) 30 MG capsule Take 1 capsule (30 mg total) by mouth daily. For depression 90 capsule 1   gabapentin (NEURONTIN) 100 MG capsule Take 1 capsule (100 mg total) by mouth 3 (three) times daily. 270 capsule 0   ondansetron (ZOFRAN) 4 MG tablet Take 1 tablet (4 mg total) by mouth daily as needed for nausea or vomiting. 30 tablet 0   traZODone (DESYREL) 50 MG tablet Take 1 tablet (50 mg total) by mouth at bedtime as needed. for sleep 90 tablet 1   No current facility-administered medications for this visit.     Musculoskeletal: Strength & Muscle Tone: within normal limits Gait & Station: normal Patient leans: N/A  Psychiatric Specialty Exam: Review of Systems  Respiratory:  Negative for shortness of breath.   Cardiovascular:  Negative for chest pain.   Gastrointestinal:  Negative for abdominal pain, constipation, diarrhea, nausea and vomiting.  Neurological:  Negative for dizziness, weakness and headaches.  Psychiatric/Behavioral:  Negative for dysphoric mood, hallucinations, sleep disturbance and suicidal ideas. The patient is not nervous/anxious.     unknown if currently breastfeeding.There is no height or weight on file to calculate BMI.  General Appearance: Casual and Fairly Groomed  Eye Contact:  Good  Speech:  Clear and Coherent and Normal Rate  Volume:  Normal  Mood:  Euthymic  Affect:  Appropriate and Congruent  Thought Process:  Coherent and Goal Directed  Orientation:  Full (Time, Place, and Person)  Thought Content: WDL and Logical   Suicidal Thoughts:  No  Homicidal Thoughts:  No  Memory:  Immediate;   Good Recent;   Good  Judgement:  Fair  Insight:  Fair  Psychomotor Activity:  Normal  Concentration:  Concentration: Good and Attention Span: Good  Recall:  Good  Fund of Knowledge: Good  Language: Good  Akathisia:  Negative  Handed:  Right  AIMS (if indicated): not done  Assets:  Communication Skills Desire for Improvement Financial Resources/Insurance Housing Social Support Vocational/Educational  ADL's:  Intact  Cognition: WNL  Sleep:  Good   Screenings: AIMS    Flowsheet Row [facility] (Dis[facility]  AIMS Total Score 0      AUDIT    Flowsheet Row [facility]) [facility] Admission (Discharged) from [facility]  Alcohol Use Disorder Identification Test Final Score (AUDIT) 2 3      GAD-7    Flowsheet Row Video Visit from 09/14/2022 in [facility] Video Visit from 08/04/2022 in [facility] Video Visit from 06/16/2022 in [facility] Video Visit from 04/14/2022 in [facility] Video Visit from 02/17/2022 in [facility]  Total GAD-7 Score 5 4 1 2  12  [id number]    Flowsheet Row [facility]  PHQ-2 Total Score 1 1 1 3 5   PHQ-9 Total Score -- -- -- 9 16      Flowsheet [facility]  C-SSRS RISK CATEGORY Moderate Risk Low Risk Low Risk        Assessment and Plan:  Lanora Manislizabeth "[name]" Marlaine HindA. Mcneese is a 31 yr old female who presents via Virtual Video Visit for follow up and medication management.  PPHx is significant for Bipolar Disorder, GAD, ADHD, and 1 Suicide Attempt (cut self with knife- 06/2020) and 1 Psychiatric Hospitalization [facility]   Waynetta SandyBeth is reporting manic and depressive episodes happening over the last few months.  Given this we will further increase her Latuda for better mood stability.  She will take a 1 week holiday from her Adderall to further improve mood stability.  We will not make any other changes to her medications at this time.  She will return for follow-up in approximately 4 weeks.   Bipolar disorder, current episode mixed, moderate  GAD: -Increase Latuda to 80 mg daily for mood stability.  30 tablets with 1 refill. -Continue Cymbalta 30 mg daily for depression and anxiety.  90 tablets with 1 refill. -Continue Gabapentin 100 mg TID for anxiety.  270 tablets with 0 refills.   ADHD: -Continue  Adderall XR 20 mg daily but will take a 1 week holiday.  30 tablets with 0 refills.   Insomnia: -Continue Trazodone 50 mg QHS PRN.  No refills sent at this time.    Collaboration of Care:   Patient/Guardian was advised Release of Information must be obtained prior to any record release in order to collaborate their care with an outside provider. Patient/Guardian was advised if they have not already done so to contact the registration department to sign all necessary forms in order for us to release information regarding their care.   Consent: Patient/Guardian gives verbal consent for treatment and assignment of benefits for services provided during this visit. Patient/Guardian expressed understanding and agreed to proceed.    Lauro FranklinAlexander S Jacoya Bauman, MD 12/10/2022, 12:03 PM     Follow Up Instructions:    I discussed the assessment and treatment plan with the patient. The patient was provided an opportunity to ask questions and all were answered. The patient agreed with the plan and demonstrated an understanding of the instructions.   The patient was advised to call back or seek an in-person evaluation if the symptoms worsen or if the condition fails to improve as anticipated.  I provided 18 minutes of non-face-to-face time during this encounter.   Lauro FranklinAlexander S Caroleann Casler, MD

## 2023-01-07 ENCOUNTER — Encounter (HOSPITAL_COMMUNITY): Payer: Self-pay | Admitting: Student in an Organized Health Care Education/Training Program

## 2023-01-07 ENCOUNTER — Telehealth (INDEPENDENT_AMBULATORY_CARE_PROVIDER_SITE_OTHER): Payer: Medicaid Other | Admitting: Student in an Organized Health Care Education/Training Program

## 2023-01-07 DIAGNOSIS — F900 Attention-deficit hyperactivity disorder, predominantly inattentive type: Secondary | ICD-10-CM | POA: Diagnosis not present

## 2023-01-07 DIAGNOSIS — F3162 Bipolar disorder, current episode mixed, moderate: Secondary | ICD-10-CM

## 2023-01-07 MED ORDER — AMPHETAMINE-DEXTROAMPHET ER 25 MG PO CP24: 25.0000 mg | ORAL_CAPSULE | Freq: Every day | ORAL | 0 refills | Status: DC

## 2023-01-07 MED ORDER — LURASIDONE HCL 80 MG PO TABS: 80.0000 mg | ORAL_TABLET | Freq: Every day | ORAL | 1 refills | Status: DC

## 2023-01-07 NOTE — Progress Notes (Signed)
BH MD/PA/NP OP Progress Note  Virtual Visit via Video Note  I connected with Wanda Torres on 01/07/23 at  4:30 PM EST by a video enabled telemedicine application and verified that I am speaking with the correct person using two identifiers.  Location: Patient: Home Provider: [facility]   I discussed the limitations of evaluation and management by telemedicine and the availability of in person appointments. The patient expressed understanding and agreed to proceed.   01/07/2023 4:53 PM Wanda Torres  MRN:  [id number]  Chief Complaint:  Chief Complaint  Patient presents with   Follow-up   Depression   Anxiety   ADHD   HPI:  Wanda Torres is a 32 yr old female who presents via Virtual Video Visit for Follow Up and Medication Management. PPHx is significant for Bipolar Disorder, GAD, ADHD, and 1 Suicide Attempt (cut self with knife- 06/2020) and 1 Psychiatric Hospitalization [facility]- 06/2020).   She reports that she has done very well with the increase in her [place].  She reports that she has had no depression or manic symptoms.  She reports her mood has been stable since the increase.  She reports no side effects other than she has been waking up every night around 2 to 3 in the morning.  She reports that she is able to go back to sleep and is still getting good sleep overall.  She does report some breakthrough anxiety when she does not take her midday dose of gabapentin.  She reports that with the stability of her mood she has noticed her ADHD more.  Discussed that we could further increase but we would need to do so slowly so that we ensured mood stability and she was agreeable with this.  She reports no SI, HI, or AVH.  She reports her sleep is good.  She reports her appetite is doing good.  She reports no other concerns at present.  She will return for follow-up in approximately 4 weeks.   Visit Diagnosis:    ICD-10-CM   1. Bipolar disorder, current episode mixed,  moderate (HCC)  F31.62 lurasidone (LATUDA) 80 MG TABS tablet    2. Attention deficit hyperactivity disorder (ADHD), predominantly inattentive type  F90.0 amphetamine-dextroamphetamine (ADDERALL XR) 25 MG 24 hr capsule      Past Psychiatric History: Bipolar Disorder, GAD, ADHD, and 1 Suicide Attempt (cut self with knife- 06/2020) and 1 Psychiatric Hospitalization [facility]- 06/2020).   Past Medical History:  Past Medical History:  Diagnosis Date   Acne    Allergy    Anemia    Anxiety    Depression    Psoriasis    Supervision of other high risk pregnancy, antepartum 01/19/2019   Formatting of this note might be different from the original. Partner prefers She/Her pronouns   No past surgical history on file.  Family Psychiatric History: None   Family History:  Family History  Problem Relation Age of Onset   Anxiety disorder Mother    Depression Mother    Sexual abuse Mother    Drug abuse Father    ADD / ADHD Sister    Anxiety disorder Sister    Bipolar disorder Sister    Depression Sister    Drug abuse Sister    Sexual abuse Sister    Anxiety disorder Brother    Depression Brother    Sexual abuse Brother    Alcohol abuse Maternal Aunt    Bipolar disorder Maternal Aunt    Depression Maternal Aunt  Sexual abuse Maternal Aunt    ADD / ADHD Maternal Uncle    Alcohol abuse Maternal Uncle    Drug abuse Maternal Uncle    Sexual abuse Maternal Uncle    Alcohol abuse Maternal Grandfather    Dementia Maternal Grandfather    Depression Maternal Grandfather    Sexual abuse Maternal Grandfather    Depression Maternal Grandmother    Sexual abuse Maternal Grandmother    Bipolar disorder Maternal Aunt    Depression Maternal Aunt    Sexual abuse Maternal Aunt     Social History:  Social History   Socioeconomic History   Marital status: Divorced    Spouse name: Not on file   Number of children: Not on file   Years of education: Not on file   Highest education level: Not on file   Occupational History    Comment: Supervisor reservations; 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[employer]  Tobacco Use   Smoking status: Never   Smokeless tobacco: Never   Tobacco comments:    Vaps daily   Vaping Use   Vaping Use: Every day   Substances: Nicotine  Substance and Sexual Activity   Alcohol use: Yes    Alcohol/week: 1.0 standard drink of alcohol    Types: 1 Standard drinks or equivalent per week    Comment: reports one a week   Drug use: Never   Sexual activity: Not on file  Other Topics Concern   Not on file  Social History Narrative   Not on file   Social Determinants of Health   Financial Resource Strain: Not on file  Food Insecurity: Not on file  Transportation Needs: Not on file  Physical Activity: Not on file  Stress: Not on file  Social Connections: Not on file    Allergies: No Known Allergies  Metabolic Disorder Labs: Lab Results  Component Value Date   HGBA1C 4.4 (L) 07/09/2020   MPG 79.58 07/09/2020   No results found for: "PROLACTIN" Lab Results  Component Value Date   CHOL 131 07/09/2020   TRIG 143 07/09/2020   HDL 59 07/09/2020   CHOLHDL 2.2 07/09/2020   VLDL 29 07/09/2020   LDLCALC 43 07/09/2020   Lab Results  Component Value Date   TSH 2.264 07/09/2020   TSH 2.210 05/01/2018    Therapeutic Level Labs: No results found for: "LITHIUM" No results found for: "VALPROATE" No results found for: "CBMZ"  Current Medications: Current Outpatient Medications  Medication Sig Dispense Refill   amphetamine-dextroamphetamine (ADDERALL XR) 20 MG 24 hr capsule Take 1 capsule (20 mg total) by mouth daily. 30 capsule 0   amphetamine-dextroamphetamine (ADDERALL XR) 20 MG 24 hr capsule Take 1 capsule (20 mg total) by mouth daily. 30 capsule 0   amphetamine-dextroamphetamine (ADDERALL XR) 25 MG 24 hr capsule Take 1 capsule by mouth daily. 30 capsule 0   DULoxetine (CYMBALTA) 30 MG capsule Take 1 capsule (30 mg total) by mouth daily. For depression 90 capsule 1    gabapentin (NEURONTIN) 100 MG capsule Take 1 capsule (100 mg total) by mouth 3 (three) times daily. 270 capsule 0   lurasidone (LATUDA) 80 MG TABS tablet Take 1 tablet (80 mg total) by mouth daily with breakfast. 30 tablet 1   ondansetron (ZOFRAN) 4 MG tablet Take 1 tablet (4 mg total) by mouth daily as needed for nausea or vomiting. 30 tablet 0   traZODone (DESYREL) 50 MG tablet Take 1 tablet (50 mg total) by mouth at bedtime as needed. for sleep 90  tablet 1   No current facility-administered medications for this visit.     Musculoskeletal: Strength & Muscle Tone: within normal limits Gait & Station:  sitting during interview Patient leans: N/A  Psychiatric Specialty Exam: Review of Systems  Respiratory:  Negative for shortness of breath.   Cardiovascular:  Negative for chest pain.  Gastrointestinal:  Negative for abdominal pain, constipation, diarrhea, nausea and vomiting.  Neurological:  Negative for dizziness, weakness and headaches.  Psychiatric/Behavioral:  Negative for dysphoric mood, hallucinations, sleep disturbance and suicidal ideas. The patient is not nervous/anxious.     unknown if currently breastfeeding.There is no height or weight on file to calculate BMI.  General Appearance: Casual and Fairly Groomed  Eye Contact:  Good  Speech:  Clear and Coherent and Normal Rate  Volume:  Normal  Mood:  Euthymic  Affect:  Appropriate and Congruent  Thought Process:  Coherent and Goal Directed  Orientation:  Full (Time, Place, and Person)  Thought Content: WDL and Logical   Suicidal Thoughts:  No  Homicidal Thoughts:  No  Memory:  Immediate;   Good Recent;   Good  Judgement:  Good  Insight:  Good  Psychomotor Activity:  Normal  Concentration:  Concentration: Good and Attention Span: Good  Recall:  Good  Fund of Knowledge: Good  Language: Good  Akathisia:  Negative  Handed:  Right  AIMS (if indicated): not done  Assets:  Communication Skills Desire for  Improvement Financial Resources/Insurance Housing Social Support Vocational/Educational  ADL's:  Intact  Cognition: WNL  Sleep:  Good   Screenings: AIMS    Flowsheet Row Admission (Discharged) from OP Visit from 07/09/2020 in [place] [facility]  AIMS Total Score 0      AUDIT    Flowsheet Row Admission (Discharged) from 09/16/2020 in [place] [facility] Admission (Discharged) from OP Visit from 07/09/2020 in [place] [facility]  Alcohol Use Disorder Identification Test Final Score (AUDIT) 2 3      GAD-7    Flowsheet Row Video Visit from 09/14/2022 in [facility] Video Visit from 08/04/2022 in [facility] Video Visit from 06/16/2022 in [facility] Video Visit from 04/14/2022 in [facility] Video Visit from 02/17/2022 in [facility]  Total GAD-7 Score 5 4 1 2 12       PHQ2-9    Flowsheet Row Video Visit from 09/14/2022 in [facility] Video Visit from 08/04/2022 in [facility] Video Visit from 06/16/2022 in [facility] Video Visit from 04/14/2022 in [facility] Video Visit from 02/17/2022 in [facility]  PHQ-2 Total Score 1 1 1 3 5   PHQ-9 Total Score -- -- -- 9 16      Flowsheet Row Video Visit from 09/14/2022 in [facility] Video Visit from 08/04/2022 in [facility] Video Visit from 06/16/2022 in [place] CATEGORY Moderate Risk Low Risk Low Risk        Assessment and Plan:  Wanda Torres is a 32 yr old female who presents via Virtual Video Visit for Follow Up and Medication Management. PPHx is significant for Bipolar  Disorder, GAD, ADHD, and 1 Suicide Attempt (cut self with knife- 06/2020) and 1 Psychiatric Hospitalization [facility]- 06/2020).    Wanda Torres has had  significant improvement in mood stability with the increase in Latuda to 80 mg.  She does report that her issues with attention and focus continue to be present so we will slowly increase her Adderall to 25 mg today.  We will not make any other changes to her medications at this time.  She will return follow-up in approximately 4 weeks.   Bipolar disorder, current episode mixed, moderate  GAD: -Continue Latuda 80 mg daily for mood stability.  30 tablets with 1 refill. -Continue Cymbalta 30 mg daily for depression and anxiety.  90 tablets with 1 refill. -Continue Gabapentin 100 mg TID for anxiety.  270 tablets with 0 refills.     ADHD: -Increase Adderall XR to 25 mg daily.  30 tablets with 0 refills.     Insomnia: -Continue Trazodone 50 mg QHS PRN.  No refills sent at this time.    Collaboration of Care:   Patient/Guardian was advised Release of Information must be obtained prior to any record release in order to collaborate their care with an outside provider. Patient/Guardian was advised if they have not already done so to contact the registration department to sign all necessary forms in order for [place] to release information regarding their care.   Consent: Patient/Guardian gives verbal consent for treatment and assignment of benefits for services provided during this visit. Patient/Guardian expressed understanding and agreed to proceed.    Lauro Franklin, MD 01/07/2023, 4:53 PM  Follow Up Instructions:    I discussed the assessment and treatment plan with the patient. The patient was provided an opportunity to ask questions and all were answered. The patient agreed with the plan and demonstrated an understanding of the instructions.   The patient was advised to call back or seek an in-person evaluation if the symptoms worsen or if the  condition fails to improve as anticipated.  I provided 20 minutes of non-face-to-face time during this encounter.   Lauro Franklin, MD

## 2023-01-19 ENCOUNTER — Telehealth (HOSPITAL_COMMUNITY): Payer: Medicaid Other | Admitting: Physician Assistant

## 2023-02-04 ENCOUNTER — Encounter (HOSPITAL_COMMUNITY): Payer: Self-pay | Admitting: Student in an Organized Health Care Education/Training Program

## 2023-02-04 ENCOUNTER — Telehealth (INDEPENDENT_AMBULATORY_CARE_PROVIDER_SITE_OTHER): Payer: Medicaid Other | Admitting: Student in an Organized Health Care Education/Training Program

## 2023-02-04 DIAGNOSIS — F3162 Bipolar disorder, current episode mixed, moderate: Secondary | ICD-10-CM

## 2023-02-04 DIAGNOSIS — F900 Attention-deficit hyperactivity disorder, predominantly inattentive type: Secondary | ICD-10-CM

## 2023-02-04 DIAGNOSIS — Z79899 Other long term (current) drug therapy: Secondary | ICD-10-CM | POA: Diagnosis not present

## 2023-02-04 DIAGNOSIS — F411 Generalized anxiety disorder: Secondary | ICD-10-CM | POA: Diagnosis not present

## 2023-02-04 MED ORDER — AMPHETAMINE-DEXTROAMPHET ER 25 MG PO CP24: 25.0000 mg | ORAL_CAPSULE | Freq: Every day | ORAL | 0 refills | Status: DC

## 2023-02-04 MED ORDER — LURASIDONE HCL 80 MG PO TABS: 80.0000 mg | ORAL_TABLET | Freq: Every day | ORAL | 1 refills | Status: DC

## 2023-02-04 NOTE — Progress Notes (Signed)
BH MD/PA/NP OP Progress Note  Virtual Visit via Video Note  I connected with Wanda Torres on 02/04/23 at  4:00 PM EST by a video enabled telemedicine application and verified that I am speaking with the correct person using two identifiers.  Location: Patient: Home Provider: [facility]   I discussed the limitations of evaluation and management by telemedicine and the availability of in person appointments. The patient expressed understanding and agreed to proceed.   02/04/2023 4:17 PM Wanda Torres  MRN:  [id number]  Chief Complaint:  Chief Complaint  Patient presents with   Follow-up   Medication Refill   HPI:  Wanda Torres is a 32 yr old female who presents via Virtual Video Visit for Follow Up and Medication Management. PPHx is significant for Bipolar Disorder, GAD, ADHD, and 1 Suicide Attempt (cut self with knife- 06/2020) and 1 Psychiatric Hospitalization [facility]- 06/2020).    She reports that she has been doing well since her last appointment.  She reports that rearranging the timing of her gabapentin has been successful in better controlling her anxiety.  She reports that the increase in her Adderall did not have any negative effect on her mood stability.  She reports that she did notice an improvement in her ADHD symptoms, however, feels like there still could be some improvement.  Discussed with her that we would wait 1 more month prior to making further changes and she was agreeable with this.  Discussed her coming in person to the follow-up appointment so that we could get routine lab work and she was agreeable with this.  She reports her mood has been stable and continues to do well with the [place].  She reports her sleep continues to be good but she still does wake up almost every night once but then quickly gets back to sleep without any issue.  She reports no SI, HI, or AVH.  She reports her appetite is good.  She reports no other concerns at present.  She will  return for in person follow-up in approximately 4 weeks.    Visit Diagnosis:    ICD-10-CM   1. Bipolar disorder, current episode mixed, moderate (HCC)  F31.62 lurasidone (LATUDA) 80 MG TABS tablet    2. Generalized anxiety disorder  F41.1     3. Attention deficit hyperactivity disorder (ADHD), predominantly inattentive type  F90.0 amphetamine-dextroamphetamine (ADDERALL XR) 25 MG 24 hr capsule    4. Long term current use of antipsychotic medication  Z79.899 Comprehensive Metabolic Panel (CMET)    CBC with Differential    Lipid Profile    TSH    HgB A1c      Past Psychiatric History: Bipolar Disorder, GAD, ADHD, and 1 Suicide Attempt (cut self with knife- 06/2020) and 1 Psychiatric Hospitalization [facility]- 06/2020).   Past Medical History:  Past Medical History:  Diagnosis Date   Acne    Allergy    Anemia    Anxiety    Depression    Psoriasis    Supervision of other high risk pregnancy, antepartum 01/19/2019   Formatting of this note might be different from the original. Partner prefers She/Her pronouns   History reviewed. No pertinent surgical history.  Family Psychiatric History: None   Family History:  Family History  Problem Relation Age of Onset   Anxiety disorder Mother    Depression Mother    Sexual abuse Mother    Drug abuse Father    ADD / ADHD Sister    Anxiety  disorder Sister    Bipolar disorder Sister    Depression Sister    Drug abuse Sister    Sexual abuse Sister    Anxiety disorder Brother    Depression Brother    Sexual abuse Brother    Alcohol abuse Maternal Aunt    Bipolar disorder Maternal Aunt    Depression Maternal Aunt    Sexual abuse Maternal Aunt    ADD / ADHD Maternal Uncle    Alcohol abuse Maternal Uncle    Drug abuse Maternal Uncle    Sexual abuse Maternal Uncle    Alcohol abuse Maternal Grandfather    Dementia Maternal Grandfather    Depression Maternal Grandfather    Sexual abuse Maternal Grandfather    Depression Maternal  Grandmother    Sexual abuse Maternal Grandmother    Bipolar disorder Maternal Aunt    Depression Maternal Aunt    Sexual abuse Maternal Aunt     Social History:  Social History   Socioeconomic History   Marital status: Divorced    Spouse name: Not on file   Number of children: Not on file   Years of education: Not on file   Highest education level: Not on file  Occupational History    Comment: Supervisor reservations; 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[employer]  Tobacco Use   Smoking status: Never   Smokeless tobacco: Never   Tobacco comments:    Vaps daily   Vaping Use   Vaping Use: Every day   Substances: Nicotine  Substance and Sexual Activity   Alcohol use: Yes    Alcohol/week: 1.0 standard drink of alcohol    Types: 1 Standard drinks or equivalent per week    Comment: reports one a week   Drug use: Never   Sexual activity: Not on file  Other Topics Concern   Not on file  Social History Narrative   Not on file   Social Determinants of Health   Financial Resource Strain: Not on file  Food Insecurity: Not on file  Transportation Needs: Not on file  Physical Activity: Not on file  Stress: Not on file  Social Connections: Not on file    Allergies: No Known Allergies  Metabolic Disorder Labs: Lab Results  Component Value Date   HGBA1C 4.4 (L) 07/09/2020   MPG 79.58 07/09/2020   No results found for: "PROLACTIN" Lab Results  Component Value Date   CHOL 131 07/09/2020   TRIG 143 07/09/2020   HDL 59 07/09/2020   CHOLHDL 2.2 07/09/2020   VLDL 29 07/09/2020   LDLCALC 43 07/09/2020   Lab Results  Component Value Date   TSH 2.264 07/09/2020   TSH 2.210 05/01/2018    Therapeutic Level Labs: No results found for: "LITHIUM" No results found for: "VALPROATE" No results found for: "CBMZ"  Current Medications: Current Outpatient Medications  Medication Sig Dispense Refill   amphetamine-dextroamphetamine (ADDERALL XR) 25 MG 24 hr capsule Take 1 capsule by mouth  daily. 30 capsule 0   DULoxetine (CYMBALTA) 30 MG capsule Take 1 capsule (30 mg total) by mouth daily. For depression 90 capsule 1   gabapentin (NEURONTIN) 100 MG capsule Take 1 capsule (100 mg total) by mouth 3 (three) times daily. 270 capsule 0   lurasidone (LATUDA) 80 MG TABS tablet Take 1 tablet (80 mg total) by mouth daily with breakfast. 30 tablet 1   ondansetron (ZOFRAN) 4 MG tablet Take 1 tablet (4 mg total) by mouth daily as needed for nausea or vomiting. 30 tablet 0  traZODone (DESYREL) 50 MG tablet Take 1 tablet (50 mg total) by mouth at bedtime as needed. for sleep 90 tablet 1   No current facility-administered medications for this visit.     Musculoskeletal: Strength & Muscle Tone: within normal limits Gait & Station:  Sitting During Interview Patient leans: N/A  Psychiatric Specialty Exam: Review of Systems  Respiratory:  Negative for shortness of breath.   Cardiovascular:  Negative for chest pain.  Gastrointestinal:  Negative for abdominal pain, constipation, diarrhea, nausea and vomiting.  Neurological:  Negative for dizziness, weakness and headaches.  Psychiatric/Behavioral:  Negative for dysphoric mood, hallucinations, sleep disturbance and suicidal ideas. The patient is not nervous/anxious.     unknown if currently breastfeeding.There is no height or weight on file to calculate BMI.  General Appearance: Casual and Fairly Groomed  Eye Contact:  Good  Speech:  Clear and Coherent and Normal Rate  Volume:  Normal  Mood:  Euthymic  Affect:  Appropriate and Congruent  Thought Process:  Coherent and Goal Directed  Orientation:  Full (Time, Place, and Person)  Thought Content: WDL and Logical   Suicidal Thoughts:  No  Homicidal Thoughts:  No  Memory:  Immediate;   Good Recent;   Good  Judgement:  Good  Insight:  Good  Psychomotor Activity:  Normal  Concentration:  Concentration: Good and Attention Span: Good  Recall:  Good  Fund of Knowledge: Good  Language:  Good  Akathisia:  Negative  Handed:  Right  AIMS (if indicated): not done  Assets:  Communication Skills Desire for Improvement Financial Resources/Insurance Housing Social Support Vocational/Educational  ADL's:  Intact  Cognition: WNL  Sleep:  Good   Screenings: AIMS    Flowsheet Row Admission (Discharged) from OP Visit from 07/09/2020 in [place] [facility]  AIMS Total Score 0      AUDIT    Flowsheet Row Admission (Discharged) from 09/16/2020 in [place] [facility] Admission (Discharged) from OP Visit from 07/09/2020 in [place] [facility]  Alcohol Use Disorder Identification Test Final Score (AUDIT) 2 3      GAD-7    Flowsheet Row Video Visit from 09/14/2022 in [facility] Video Visit from 08/04/2022 in [facility] Video Visit from 06/16/2022 in [facility] Video Visit from 04/14/2022 in [facility] Video Visit from 02/17/2022 in [facility]  Total GAD-7 Score 5 4 1 2 12      $ PHQ2-9    Flowsheet Row Video Visit from 09/14/2022 in [facility] Video Visit from 08/04/2022 in [facility] Video Visit from 06/16/2022 in [facility] Video Visit from 04/14/2022 in [facility] Video Visit from 02/17/2022 in [facility]  PHQ-2 Total Score 1 1 1 3 5  $ PHQ-9 Total Score -- -- -- 9 16      Flowsheet Row Video Visit from 09/14/2022 in [facility] Video Visit from 08/04/2022 in [facility] Video Visit from 06/16/2022 in [place] CATEGORY Moderate Risk Low Risk Low Risk        Assessment and Plan:  Wanda Torres is a 32 yr  old female who presents via Virtual Video Visit for Follow Up and Medication Management. PPHx is significant for Bipolar Disorder, GAD, ADHD, and  1 Suicide Attempt (cut self with knife- 06/2020) and 1 Psychiatric Hospitalization [facility]- 06/2020).     Wanda Torres continues to do well on her current medications.  We will not make any changes to her medications at this time.  Refills will be sent in.  She will return for in person follow-up to get routine monitoring lab work-CMP, CBC, lipid panel, A1c, and TSH.  She will return for follow-up approximately 4 weeks.   Bipolar disorder, current episode mixed, moderate  GAD: -Continue Latuda 80 mg daily for mood stability.  30 tablets with 1 refill. -Continue Cymbalta 30 mg daily for depression and anxiety.  No refills sent at this time. -Continue Gabapentin 100 mg TID for anxiety.  No refills sent at this time.     ADHD: -Continue Adderall XR 25 mg daily.  30 tablets with 0 refills.     Insomnia: -Continue Trazodone 50 mg QHS PRN.  No refills sent at this time.    Collaboration of Care:   Patient/Guardian was advised Release of Information must be obtained prior to any record release in order to collaborate their care with an outside provider. Patient/Guardian was advised if they have not already done so to contact the registration department to sign all necessary forms in order for [place] to release information regarding their care.   Consent: Patient/Guardian gives verbal consent for treatment and assignment of benefits for services provided during this visit. Patient/Guardian expressed understanding and agreed to proceed.    Briant Cedar, MD 02/04/2023, 4:17 PM   Follow Up Instructions:    I discussed the assessment and treatment plan with the patient. The patient was provided an opportunity to ask questions and all were answered. The patient agreed with the plan and demonstrated an understanding of the instructions.   The patient was  advised to call back or seek an in-person evaluation if the symptoms worsen or if the condition fails to improve as anticipated.  I provided 10 minutes of non-face-to-face time during this encounter.   Briant Cedar, MD

## 2023-02-25 ENCOUNTER — Telehealth (HOSPITAL_COMMUNITY): Payer: Medicaid Other | Admitting: Student in an Organized Health Care Education/Training Program

## 2023-03-09 ENCOUNTER — Telehealth (HOSPITAL_COMMUNITY): Payer: Self-pay | Admitting: *Deleted

## 2023-03-09 NOTE — Telephone Encounter (Signed)
Patient called asking for refill of Adderall and Latuda. States she only has 1 day left. Anette Guarneri has 1 refill on file. Next appointment 3/25.

## 2023-03-10 ENCOUNTER — Ambulatory Visit (INDEPENDENT_AMBULATORY_CARE_PROVIDER_SITE_OTHER): Payer: Medicaid Other | Admitting: Family Medicine

## 2023-03-10 ENCOUNTER — Telehealth (HOSPITAL_COMMUNITY): Payer: Self-pay | Admitting: *Deleted

## 2023-03-10 ENCOUNTER — Other Ambulatory Visit: Payer: Self-pay | Admitting: Family Medicine

## 2023-03-10 ENCOUNTER — Other Ambulatory Visit (HOSPITAL_COMMUNITY): Payer: Self-pay | Admitting: Student

## 2023-03-10 ENCOUNTER — Encounter: Payer: Self-pay | Admitting: Family Medicine

## 2023-03-10 VITALS — BP 133/80 | HR 116 | Temp 98.2°F | Resp 20 | Ht 72.0 in | Wt 295.0 lb

## 2023-03-10 DIAGNOSIS — Z7689 Persons encountering health services in other specified circumstances: Secondary | ICD-10-CM

## 2023-03-10 DIAGNOSIS — Z6841 Body Mass Index (BMI) 40.0 and over, adult: Secondary | ICD-10-CM

## 2023-03-10 DIAGNOSIS — R5382 Chronic fatigue, unspecified: Secondary | ICD-10-CM | POA: Diagnosis not present

## 2023-03-10 DIAGNOSIS — F332 Major depressive disorder, recurrent severe without psychotic features: Secondary | ICD-10-CM

## 2023-03-10 DIAGNOSIS — F3162 Bipolar disorder, current episode mixed, moderate: Secondary | ICD-10-CM

## 2023-03-10 DIAGNOSIS — F900 Attention-deficit hyperactivity disorder, predominantly inattentive type: Secondary | ICD-10-CM

## 2023-03-10 DIAGNOSIS — R7302 Impaired glucose tolerance (oral): Secondary | ICD-10-CM | POA: Diagnosis not present

## 2023-03-10 DIAGNOSIS — Z13220 Encounter for screening for lipoid disorders: Secondary | ICD-10-CM

## 2023-03-10 DIAGNOSIS — Z23 Encounter for immunization: Secondary | ICD-10-CM

## 2023-03-10 MED ORDER — AMPHETAMINE-DEXTROAMPHET ER 25 MG PO CP24: 25.0000 mg | ORAL_CAPSULE | Freq: Every day | ORAL | 0 refills | Status: DC

## 2023-03-10 MED ORDER — LURASIDONE HCL 80 MG PO TABS: 80.0000 mg | ORAL_TABLET | Freq: Every day | ORAL | 1 refills | Status: DC

## 2023-03-10 NOTE — Progress Notes (Signed)
Per clinic RN, patient has called for 2 days requesting refills of her Adderall and Latuda.  Her regular psychiatric provider here, Dr. Kai Levins, appears to be on vacation.  Chart and PDMP reviewed.  The patient has received regular fills of Adderall with this provider.  30-day refill sent in.    Corky Sox, MD PGY-2

## 2023-03-10 NOTE — Telephone Encounter (Signed)
Second day calling for a new rx to be sent to her pharmacy for her latuda and adderall. Will notify Dr Valarie Merino to send in rx as Dr Kai Levins is out of the office this week, and he is patients usual provder.  SHe should be out of these meds and has a future appt with Dr Kai Levins on 03/21/23

## 2023-03-10 NOTE — Progress Notes (Signed)
New Patient Office Visit  Subjective    Patient ID: Wanda Torres, female    DOB: 18-Jul-1991  Age: 32 y.o. MRN: [id number]  CC:  Chief Complaint  Patient presents with   Establish Care    Unable to lose weight    HPI Wanda Torres presents to establish care. Pt is new to me.  Pt has hx of bipolar disorder. She has psychiatrist that prescribed her Cymbalta '30mg'$  daily, Latuda 80 mg daily, Neurontin '100mg'$  TID, Trazodone '50mg'$  bedtime prn. Pt has struggled with her weight and has tried dieting and weight watchers. She has been doing Yoga and walking along with biking 45 mins-1 hour. She would like to see someone about the weight loss. She asked about going back on Metformin. Has a hx of PCOS. She would like flu and covid booster, up to date with Tdap.  Outpatient Encounter Medications as of 03/10/2023  Medication Sig   amphetamine-dextroamphetamine (ADDERALL XR) 25 MG 24 hr capsule Take 1 capsule by mouth daily.   DULoxetine (CYMBALTA) 30 MG capsule Take 1 capsule (30 mg total) by mouth daily. For depression   gabapentin (NEURONTIN) 100 MG capsule Take 1 capsule (100 mg total) by mouth 3 (three) times daily.   lurasidone (LATUDA) 80 MG TABS tablet Take 1 tablet (80 mg total) by mouth daily with breakfast.   traZODone (DESYREL) 50 MG tablet Take 1 tablet (50 mg total) by mouth at bedtime as needed. for sleep   No facility-administered encounter medications on file as of 03/10/2023.    Past Medical History:  Diagnosis Date   Acne    Allergy    Anemia    Anxiety    ASCUS with positive high risk human papillomavirus of vagina 05/2022   Depression    Psoriasis    Supervision of other high risk pregnancy, antepartum 01/19/2019   Formatting of this note might be different from the original. Partner prefers She/Her pronouns    Past Surgical History:  Procedure Laterality Date   COLPOSCOPY  07/2022   ENDOCERVICAL MUCOSA WITH CHRONIC CERVICITIS.   NO SQUAMOUS MUCOSA  IDENTIFIED.   NO EVIDENCE OF DYSPLASIA OR MALIGNANCY.    Family History  Problem Relation Age of Onset   Anxiety disorder Mother    Depression Mother    Sexual abuse Mother    Drug abuse Father    ADD / ADHD Sister    Anxiety disorder Sister    Bipolar disorder Sister    Depression Sister    Drug abuse Sister    Sexual abuse Sister    Anxiety disorder Brother    Depression Brother    Sexual abuse Brother    Alcohol abuse Maternal Aunt    Bipolar disorder Maternal Aunt    Depression Maternal Aunt    Sexual abuse Maternal Aunt    ADD / ADHD Maternal Uncle    Alcohol abuse Maternal Uncle    Drug abuse Maternal Uncle    Sexual abuse Maternal Uncle    Alcohol abuse Maternal Grandfather    Dementia Maternal Grandfather    Depression Maternal Grandfather    Sexual abuse Maternal Grandfather    Depression Maternal Grandmother    Sexual abuse Maternal Grandmother    Bipolar disorder Maternal Aunt    Depression Maternal Aunt    Sexual abuse Maternal Aunt     Social History   Socioeconomic History   Marital status: Divorced    Spouse name: Not on file   Number of  children: 4   Years of education: Not on file   Highest education level: Not on file  Occupational History    Comment: Supervisor reservations; 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[employer]  Tobacco Use   Smoking status: Never   Smokeless tobacco: Never   Tobacco comments:    Vaps daily   Vaping Use   Vaping Use: Every day   Substances: Nicotine  Substance and Sexual Activity   Alcohol use: Yes    Alcohol/week: 1.0 standard drink of alcohol    Types: 1 Standard drinks or equivalent per week    Comment: reports one a week   Drug use: Never   Sexual activity: Not on file  Other Topics Concern   Not on file  Social History Narrative   Not on file   Social Determinants of Health   Financial Resource Strain: Not on file  Food Insecurity: Not on file  Transportation Needs: Not on file  Physical Activity: Not on file   Stress: Not on file  Social Connections: Not on file  Intimate Partner Violence: Not on file    Review of Systems  Constitutional:        Fatigue and weight gain  All other systems reviewed and are negative.      Objective    BP 133/80   Pulse (!) 116   Temp 98.2 F (36.8 C) (Oral)   Resp 20   Ht 6' (1.829 m)   Wt 295 lb (133.8 kg)   LMP 03/01/2020 (Approximate)   SpO2 96%   BMI 40.01 kg/m   Physical Exam Vitals and nursing note reviewed.  Constitutional:      Appearance: Normal appearance. She is obese.  HENT:     Head: Normocephalic and atraumatic.     Right Ear: Tympanic membrane, ear canal and external ear normal.     Left Ear: Tympanic membrane, ear canal and external ear normal.     Nose: Nose normal.     Mouth/Throat:     Mouth: Mucous membranes are moist.     Pharynx: Oropharynx is clear.  Eyes:     Conjunctiva/sclera: Conjunctivae normal.     Pupils: Pupils are equal, round, and reactive to light.  Cardiovascular:     Rate and Rhythm: Normal rate and regular rhythm.     Pulses: Normal pulses.     Heart sounds: Normal heart sounds.  Pulmonary:     Effort: Pulmonary effort is normal.     Breath sounds: Normal breath sounds.  Abdominal:     General: Abdomen is flat. Bowel sounds are normal.  Musculoskeletal:        General: Normal range of motion.  Skin:    General: Skin is warm.     Capillary Refill: Capillary refill takes less than 2 seconds.  Neurological:     General: No focal deficit present.     Mental Status: She is alert and oriented to person, place, and time. Mental status is at baseline.  Psychiatric:        Mood and Affect: Mood normal.        Behavior: Behavior normal.        Thought Content: Thought content normal.        Judgment: Judgment normal.       Assessment & Plan:   Problem List Items Addressed This Visit   None  Encounter to establish care with new doctor  Bipolar disorder, current episode mixed, moderate  ([place])  Major depressive disorder, recurrent severe  without psychotic features ([place])  Impaired glucose tolerance -     CBC with Differential/Platelet -     Comprehensive metabolic panel -     Hemoglobin A1c  Encounter for lipid screening for cardiovascular disease -     Lipid panel  BMI 40.0-44.9, adult (HCC)  Chronic fatigue -     TSH -     T4, free -     VITAMIN D 25 Hydroxy (Vit-D Deficiency, Fractures)  Need for influenza vaccination -     Flu Vaccine QUAD 68moIM (Fluarix, Fluzone & Alfiuria Quad PF)  Need for first booster dose of COVID-19 vaccine -     PFIZER Comirnaty(GRAY TOP)COVID-19 Vaccine   Continue follow up with psychiatrist as scheduled for bipolar depression disorder Screening labs today due fatigue, weight gain, and PCOS.  Refer to healthy weight and wellness Flu vaccine along with covid booster today.  No follow-ups on file.   ALeeanne Rio MD

## 2023-03-11 LAB — T4, FREE: Free T4: 1.13 ng/dL (ref 0.82–1.77)

## 2023-03-11 LAB — COMPREHENSIVE METABOLIC PANEL
ALT: 29 IU/L (ref 0–32)
AST: 26 IU/L (ref 0–40)
Albumin/Globulin Ratio: 1.9 (ref 1.2–2.2)
Albumin: 4.5 g/dL (ref 3.9–4.9)
Alkaline Phosphatase: 68 IU/L (ref 44–121)
BUN/Creatinine Ratio: 19 (ref 9–23)
BUN: 15 mg/dL (ref 6–20)
Bilirubin Total: 0.5 mg/dL (ref 0.0–1.2)
CO2: 22 mmol/L (ref 20–29)
Calcium: 9.5 mg/dL (ref 8.7–10.2)
Chloride: 101 mmol/L (ref 96–106)
Creatinine, Ser: 0.8 mg/dL (ref 0.57–1.00)
Globulin, Total: 2.4 g/dL (ref 1.5–4.5)
Glucose: 88 mg/dL (ref 70–99)
Potassium: 4.2 mmol/L (ref 3.5–5.2)
Sodium: 139 mmol/L (ref 134–144)
Total Protein: 6.9 g/dL (ref 6.0–8.5)
eGFR: 100 mL/min/{1.73_m2} (ref >59)

## 2023-03-11 LAB — CBC WITH DIFFERENTIAL/PLATELET
Basophils Absolute: 0 10*3/uL (ref 0.0–0.2)
Basos: 1 %
EOS (ABSOLUTE): 0.1 10*3/uL (ref 0.0–0.4)
Eos: 2 %
Hematocrit: 43.9 % (ref 34.0–46.6)
Hemoglobin: 15 g/dL (ref 11.1–15.9)
Immature Grans (Abs): 0 10*3/uL (ref 0.0–0.1)
Immature Granulocytes: 1 %
Lymphocytes Absolute: 2.1 10*3/uL (ref 0.7–3.1)
Lymphs: 40 %
MCH: 33.4 pg — ABNORMAL HIGH (ref 26.6–33.0)
MCHC: 34.2 g/dL (ref 31.5–35.7)
MCV: 98 fL — ABNORMAL HIGH (ref 79–97)
Monocytes Absolute: 0.5 10*3/uL (ref 0.1–0.9)
Monocytes: 10 %
Neutrophils Absolute: 2.5 10*3/uL (ref 1.4–7.0)
Neutrophils: 46 %
Platelets: 254 10*3/uL (ref 150–450)
RBC: 4.49 x10E6/uL (ref 3.77–5.28)
RDW: 13.8 % (ref 11.7–15.4)
WBC: 5.3 10*3/uL (ref 3.4–10.8)

## 2023-03-11 LAB — TSH: TSH: 3.45 u[IU]/mL (ref 0.450–4.500)

## 2023-03-11 LAB — LIPID PANEL
Chol/HDL Ratio: 2.1 ratio (ref 0.0–4.4)
Cholesterol, Total: 118 mg/dL (ref 100–199)
HDL: 55 mg/dL (ref >39)
LDL Chol Calc (NIH): 9 mg/dL (ref 0–99)
Triglycerides: 409 mg/dL — ABNORMAL HIGH (ref 0–149)
VLDL Cholesterol Cal: 54 mg/dL — ABNORMAL HIGH (ref 5–40)

## 2023-03-11 LAB — HEMOGLOBIN A1C
Est. average glucose Bld gHb Est-mCnc: 91 mg/dL
Hgb A1c MFr Bld: 4.8 % (ref 4.8–5.6)

## 2023-03-11 LAB — VITAMIN D 25 HYDROXY (VIT D DEFICIENCY, FRACTURES): Vit D, 25-Hydroxy: 41.3 ng/mL (ref 30.0–100.0)

## 2023-03-12 NOTE — Telephone Encounter (Signed)
Refills of Adderall XR and Latuda sent in by Dr. Alvie Heidelberg on 03/10/2023.  Next appointment is with provider is 3/25.    Fatima Sanger MD Resident

## 2023-03-21 ENCOUNTER — Encounter: Payer: Self-pay | Admitting: Nurse Practitioner

## 2023-03-21 ENCOUNTER — Ambulatory Visit (INDEPENDENT_AMBULATORY_CARE_PROVIDER_SITE_OTHER): Payer: Medicaid Other | Admitting: Student in an Organized Health Care Education/Training Program

## 2023-03-21 ENCOUNTER — Other Ambulatory Visit (HOSPITAL_COMMUNITY): Payer: Medicaid Other

## 2023-03-21 ENCOUNTER — Ambulatory Visit (INDEPENDENT_AMBULATORY_CARE_PROVIDER_SITE_OTHER): Payer: Medicaid Other | Admitting: Nurse Practitioner

## 2023-03-21 VITALS — BP 125/85 | HR 92 | Temp 98.0°F | Ht 72.0 in | Wt 291.0 lb

## 2023-03-21 DIAGNOSIS — E669 Obesity, unspecified: Secondary | ICD-10-CM | POA: Diagnosis not present

## 2023-03-21 DIAGNOSIS — F411 Generalized anxiety disorder: Secondary | ICD-10-CM | POA: Diagnosis not present

## 2023-03-21 DIAGNOSIS — Z6839 Body mass index (BMI) 39.0-39.9, adult: Secondary | ICD-10-CM

## 2023-03-21 DIAGNOSIS — F3162 Bipolar disorder, current episode mixed, moderate: Secondary | ICD-10-CM | POA: Diagnosis not present

## 2023-03-21 DIAGNOSIS — F900 Attention-deficit hyperactivity disorder, predominantly inattentive type: Secondary | ICD-10-CM

## 2023-03-21 DIAGNOSIS — E282 Polycystic ovarian syndrome: Secondary | ICD-10-CM

## 2023-03-21 DIAGNOSIS — Z0289 Encounter for other administrative examinations: Secondary | ICD-10-CM

## 2023-03-21 MED ORDER — LURASIDONE HCL 80 MG PO TABS: 80.0000 mg | ORAL_TABLET | Freq: Every day | ORAL | 1 refills | Status: DC

## 2023-03-21 MED ORDER — GABAPENTIN 100 MG PO CAPS: 100.0000 mg | ORAL_CAPSULE | Freq: Three times a day (TID) | ORAL | 0 refills | Status: DC

## 2023-03-21 MED ORDER — AMPHETAMINE-DEXTROAMPHET ER 25 MG PO CP24: 25.0000 mg | ORAL_CAPSULE | Freq: Every day | ORAL | 0 refills | Status: DC

## 2023-03-21 NOTE — Progress Notes (Signed)
BH MD/PA/NP OP Progress Note  03/22/2023 7:09 AM Wanda Torres  MRN:  [id number]  Chief Complaint:  Chief Complaint  Patient presents with   Follow-up   ADHD   HPI:  Wanda Torres is a 32 yr old female who presents via Virtual Video Visit for Follow Up and Medication Management. PPHx is significant for Bipolar Disorder, GAD, ADHD, and 1 Suicide Attempt (cut self with knife- 06/2020) and 1 Psychiatric Hospitalization [facility]- 06/2020).    She presents today with her daughter.  She reports that things have been going well.  She reports that she got her lab work done at her new PCP office but she does report that she was not fasting so lipid panel is not completely accurate.  She reports that her anxiety has been under good control.  She reports her mood has remained stable.  She reports she continues to have issues with focus and attention and wanted to discuss further increasing her ADHD medication.  Discussed with her that given her elevated heart rate and blood pressure that further addition of stimulant medications would not be safe.  Encouraged her to discuss with her PCP about potentially starting a beta-blocker medication as this would help with both BP and HR.  She reports she plans to also reduce or stop drinking coffee as she was drinking a quadruple shot [employer] coffee during this interview.  Discussed with her that if her heart rate and blood pressure do improve we could further discuss changes to her Adderall but at this time it would be unsafe to make any changes and she reported understanding and was agreeable with this.  Discussed we would not make any changes to her medications at this time and she was agreeable with this.  She reports no SI, HI, or AVH.  She reports sleep is good.  She reports her appetite is doing good.  She reports no other concerns at present.  She will return for follow-up in approximately 6 weeks.   Visit Diagnosis:    ICD-10-CM   1.  Generalized anxiety disorder  F41.1 gabapentin (NEURONTIN) 100 MG capsule    2. Attention deficit hyperactivity disorder (ADHD), predominantly inattentive type  F90.0 amphetamine-dextroamphetamine (ADDERALL XR) 25 MG 24 hr capsule    3. Bipolar disorder, current episode mixed, moderate (HCC)  F31.62 lurasidone (LATUDA) 80 MG TABS tablet      Past Psychiatric History: Bipolar Disorder, GAD, ADHD, and 1 Suicide Attempt (cut self with knife- 06/2020) and 1 Psychiatric Hospitalization [facility]- 06/2020).    Past Medical History:  Past Medical History:  Diagnosis Date   Acne    Allergy    Anemia    Anxiety    ASCUS with positive high risk human papillomavirus of vagina 05/2022   Depression    Psoriasis    Supervision of other high risk pregnancy, antepartum 01/19/2019   Formatting of this note might be different from the original. Partner prefers She/Her pronouns    Past Surgical History:  Procedure Laterality Date   COLPOSCOPY  07/2022   ENDOCERVICAL MUCOSA WITH CHRONIC CERVICITIS.   NO SQUAMOUS MUCOSA IDENTIFIED.   NO EVIDENCE OF DYSPLASIA OR MALIGNANCY.    Family Psychiatric History: None  Family History:  Family History  Problem Relation Age of Onset   Anxiety disorder Mother    Depression Mother    Sexual abuse Mother    Hypothyroidism Mother    Drug abuse Father    ADD / ADHD Sister  Anxiety disorder Sister    Bipolar disorder Sister    Depression Sister    Drug abuse Sister    Sexual abuse Sister    Anxiety disorder Brother    Depression Brother    Sexual abuse Brother    Thyroid disease Maternal Grandmother        THyroid cancer   Depression Maternal Grandmother    Sexual abuse Maternal Grandmother    Alcohol abuse Maternal Grandfather    Dementia Maternal Grandfather    Depression Maternal Grandfather    Sexual abuse Maternal Grandfather    Alcohol abuse Maternal Aunt    Bipolar disorder Maternal Aunt    Depression Maternal Aunt    Sexual abuse Maternal Aunt     Bipolar disorder Maternal Aunt    Depression Maternal Aunt    Sexual abuse Maternal Aunt    ADD / ADHD Maternal Uncle    Alcohol abuse Maternal Uncle    Drug abuse Maternal Uncle    Sexual abuse Maternal Uncle     Social History:  Social History   Socioeconomic History   Marital status: Divorced    Spouse name: Not on file   Number of children: 4   Years of education: Not on file   Highest education level: Not on file  Occupational History    Comment: Supervisor reservations; 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[employer]  Tobacco Use   Smoking status: Never   Smokeless tobacco: Never   Tobacco comments:    Vaps daily   Vaping Use   Vaping Use: Every day   Substances: Nicotine  Substance and Sexual Activity   Alcohol use: Yes    Alcohol/week: 1.0 standard drink of alcohol    Types: 1 Standard drinks or equivalent per week    Comment: reports one a week   Drug use: Never   Sexual activity: Not on file  Other Topics Concern   Not on file  Social History Narrative   Not on file   Social Determinants of Health   Financial Resource Strain: Not on file  Food Insecurity: Not on file  Transportation Needs: Not on file  Physical Activity: Not on file  Stress: Not on file  Social Connections: Not on file    Allergies: No Known Allergies  Metabolic Disorder Labs: Lab Results  Component Value Date   HGBA1C 4.8 03/10/2023   MPG 79.58 07/09/2020   No results found for: "PROLACTIN" Lab Results  Component Value Date   CHOL 118 03/10/2023   TRIG 409 (H) 03/10/2023   HDL 55 03/10/2023   CHOLHDL 2.1 03/10/2023   VLDL 29 07/09/2020   LDLCALC 9 03/10/2023   LDLCALC 43 07/09/2020   Lab Results  Component Value Date   TSH 3.450 03/10/2023   TSH 2.264 07/09/2020    Therapeutic Level Labs: No results found for: "LITHIUM" No results found for: "VALPROATE" No results found for: "CBMZ"  Current Medications: Current Outpatient Medications  Medication Sig Dispense Refill    [START ON 03/28/2023] amphetamine-dextroamphetamine (ADDERALL XR) 25 MG 24 hr capsule Take 1 capsule by mouth daily. 30 capsule 0   DULoxetine (CYMBALTA) 30 MG capsule Take 1 capsule (30 mg total) by mouth daily. For depression 90 capsule 1   gabapentin (NEURONTIN) 100 MG capsule Take 1 capsule (100 mg total) by mouth 3 (three) times daily. 270 capsule 0   lurasidone (LATUDA) 80 MG TABS tablet Take 1 tablet (80 mg total) by mouth daily with breakfast. 30 tablet 1   traZODone (DESYREL)  50 MG tablet Take 1 tablet (50 mg total) by mouth at bedtime as needed. for sleep 90 tablet 1   No current facility-administered medications for this visit.     Musculoskeletal: Strength & Muscle Tone: within normal limits Gait & Station: normal Patient leans: N/A  Psychiatric Specialty Exam: Review of Systems  Respiratory:  Negative for shortness of breath.   Cardiovascular:  Negative for chest pain.  Gastrointestinal:  Negative for abdominal pain, constipation, diarrhea, nausea and vomiting.  Neurological:  Negative for dizziness, weakness and headaches.  Psychiatric/Behavioral:  Negative for dysphoric mood, hallucinations, sleep disturbance and suicidal ideas. The patient is not nervous/anxious.     Blood pressure (!) 135/95, pulse (!) 109, height 6' (1.829 m), weight 295 lb 12.8 oz (134.2 kg), last menstrual period 03/01/2020, SpO2 97 %.Body mass index is 40.12 kg/m.  General Appearance: Casual and Fairly Groomed  Eye Contact:  Good  Speech:  Clear and Coherent and Normal Rate  Volume:  Normal  Mood:   "ok"  Affect:  Appropriate and Congruent  Thought Process:  Coherent and Goal Directed  Orientation:  Full (Time, Place, and Person)  Thought Content: WDL and Logical   Suicidal Thoughts:  No  Homicidal Thoughts:  No  Memory:  Immediate;   Good Recent;   Good  Judgement:  Good  Insight:  Good  Psychomotor Activity:  Normal  Concentration:  Concentration: Good and Attention Span: Good  Recall:   Good  Fund of Knowledge: Good  Language: Good  Akathisia:  Negative  Handed:  Right  AIMS (if indicated): not done  Assets:  Communication Skills Desire for Improvement Financial Resources/Insurance Housing Resilience Social Support Vocational/Educational  ADL's:  Intact  Cognition: WNL  Sleep:  Good   Screenings: [place] Admission (Discharged) from OP Visit from 07/09/2020 in [facility]  AIMS Total Score 0      AUDIT    Flowsheet Row Admission (Discharged) from 09/16/2020 in [place] [facility] Admission (Discharged) from OP Visit from 07/09/2020 in [place] [facility]  Alcohol Use Disorder Identification Test Final Score (AUDIT) 2 3      GAD-7    Flowsheet Row Office Visit from 03/10/2023 in [facility] Video Visit from 09/14/2022 in [facility] Video Visit from 08/04/2022 in [facility] Video Visit from 06/16/2022 in [facility] Video Visit from 04/14/2022 in [facility]  Total GAD-7 Score 6 5 4 1 2       PHQ2-9    [place] Office Visit from 03/10/2023 in [place] [facility] Video Visit from 09/14/2022 in [facility] Video Visit from 08/04/2022 in [facility] Video Visit from 06/16/2022 in [facility] Video Visit from 04/14/2022 in [facility]  PHQ-2 Total Score 1 1 1 1 3   PHQ-9 Total Score 7 -- -- -- 9      Flowsheet Row Video Visit from 09/14/2022 in [facility] Video Visit from 08/04/2022 in [facility] Video Visit from 06/16/2022 in [place] and Plan:  Emry "[name]" Elicia Torres is a 32 yr old female who presents via Hospital doctor  Video Visit for Follow Up and Medication Management. PPHx is significant for Bipolar Disorder, GAD, ADHD, and 1 Suicide Attempt (cut self with knife- 06/2020) and 1 Psychiatric Hospitalization [facility]- 06/2020).     [name] has continued to do well with her medication regiment.  However, her blood pressure and heart rate are elevated and this has been an issue for a while.  Due to this it would be unsafe to increase her Adderall at this time.  She will make an appointment with her PCP to discuss starting a Beta blocker and she plans to stop drinking coffee.  If her heart rate and BP improve at her next appointment will discuss further increases to her Adderall at that time.  We will not make any changes to her medications at this time.  Refills were sent in.  He will return for in person follow up in approximately 6 weeks.   Bipolar disorder, current episode mixed, moderate  GAD: -Continue Latuda 80 mg daily for mood stability.  30 tablets with 1 refill. -Continue Cymbalta 30 mg daily for depression and anxiety.  No refills sent at this time. -Continue Gabapentin 100 mg TID for anxiety.  270 tablets with 0 refills.     ADHD: -Continue Adderall XR 25 mg daily.  30 tablets with 0 refills.     Insomnia: -Continue Trazodone 50 mg QHS PRN.  No refills sent at this time.   Collaboration of Care: Collaboration of Care:   Patient/Guardian was advised Release of Information must be obtained prior to any record release in order to collaborate their care with an outside provider. Patient/Guardian was advised if they have not already done so to contact the registration department to sign all necessary forms in order for [place] to release information regarding their care.   Consent: Patient/Guardian gives verbal consent for treatment and assignment of benefits for services provided during this visit.  Patient/Guardian expressed understanding and agreed to proceed.    Briant Cedar, MD 03/22/2023, 7:09 AM

## 2023-03-21 NOTE — Progress Notes (Signed)
Office: [phone]  /  Fax: [phone]   Initial Visit  Wanda Torres was seen in clinic today to evaluate for obesity. She is interested in losing weight to improve overall health and reduce the risk of weight related complications. She presents today to review program treatment options, initial physical assessment, and evaluation.     She was referred by: PCP  When asked what else they would like to accomplish? She states: Adopt healthier eating patterns, Improve energy levels and physical activity, and Lose a target amount of weight : < 200 lbs  Weight history:  She started gaining weight after having her 2 children back to back and excess weight after having her knee surgery in 2020.  She started was diagnosed with bipolar and has had several medication changes.  Her current meds are working well for her.  She is frustrated with not losing weight and has been trying for the past 1.5 years.   When asked how has your weight affected you? She states: Having fatigue and Having poor endurance  Some associated conditions:  She has a PMH of PCOS, anxiety, depression, insomnia, Bipolar disorder and knee pain (history of rupture ACL right).   Contributing factors: Family history, Medications, Life event, and Pregnancy  Weight promoting medications identified: Psychotropic medications  Current nutrition plan: She follows a vegetarian/pescatarian/eggs plan.  She has done Weight Watchers, IF and various other diet plans.    Current level of physical activity: Walking and Other: biking  Current or previous pharmacotherapy: None  Response to medication: Never tried medications   Past medical history includes:   Past Medical History:  Diagnosis Date   Acne    Allergy    Anemia    Anxiety    ASCUS with positive high risk human papillomavirus of vagina 05/2022   Depression    Psoriasis    Supervision of other high risk pregnancy, antepartum 01/19/2019   Formatting of this note  might be different from the original. Partner prefers She/Her pronouns     Objective:   BP 125/85   Pulse 92   Temp 98 F (36.7 C)   Ht 6' (1.829 m)   Wt 291 lb (132 kg)   LMP 03/01/2020 (Approximate)   SpO2 99%   Breastfeeding No   BMI 39.47 kg/m  She was weighed on the bioimpedance scale: Body mass index is 39.47 kg/m.  Peak Weight: 300 lbs , Body Fat%:47.7, Visceral Fat Rating:12, Weight trend over the last 12 months: Increasing  General:  Alert, oriented and cooperative. Patient is in no acute distress.  Respiratory: Normal respiratory effort, no problems with respiration noted   Gait: able to ambulate independently  Mental Status: Normal mood and affect. Normal behavior. Normal judgment and thought content.   DIAGNOSTIC DATA REVIEWED:  BMET    Component Value Date/Time   NA 139 03/10/2023 1438   K 4.2 03/10/2023 1438   CL 101 03/10/2023 1438   CO2 22 03/10/2023 1438   GLUCOSE 88 03/10/2023 1438   GLUCOSE 63 (L) 09/15/2020 2229   BUN 15 03/10/2023 1438   CREATININE 0.80 03/10/2023 1438   CALCIUM 9.5 03/10/2023 1438   GFRNONAA >60 09/15/2020 2229   GFRAA >60 09/15/2020 2229   Lab Results  Component Value Date   HGBA1C 4.8 03/10/2023   HGBA1C 4.4 (L) 07/09/2020   No results found for: "INSULIN" CBC    Component Value Date/Time   WBC 5.3 03/10/2023 1438   WBC 7.2 09/15/2020 2229  RBC 4.49 03/10/2023 1438   RBC 4.78 09/15/2020 2229   HGB 15.0 03/10/2023 1438   HCT 43.9 03/10/2023 1438   PLT 254 03/10/2023 1438   MCV 98 (H) 03/10/2023 1438   MCH 33.4 (H) 03/10/2023 1438   MCH 33.7 09/15/2020 2229   MCHC 34.2 03/10/2023 1438   MCHC 35.8 09/15/2020 2229   RDW 13.8 03/10/2023 1438   Iron/TIBC/Ferritin/ %Sat    Component Value Date/Time   IRON 79 05/01/2018 1740   Lipid Panel     Component Value Date/Time   CHOL 118 03/10/2023 1438   TRIG 409 (H) 03/10/2023 1438   HDL 55 03/10/2023 1438   CHOLHDL 2.1 03/10/2023 1438   CHOLHDL 2.2 07/09/2020  1745   VLDL 29 07/09/2020 1745   LDLCALC 9 03/10/2023 1438   Hepatic Function Panel     Component Value Date/Time   PROT 6.9 03/10/2023 1438   ALBUMIN 4.5 03/10/2023 1438   AST 26 03/10/2023 1438   ALT 29 03/10/2023 1438   ALKPHOS 68 03/10/2023 1438   BILITOT 0.5 03/10/2023 1438   BILIDIR 0.1 07/09/2020 1745   IBILI 0.6 07/09/2020 1745      Component Value Date/Time   TSH 3.450 03/10/2023 1438     Assessment and Plan:   PCOS (polycystic ovarian syndrome) Has taken Metformin in the past and is interested in retrying it again.     Generalized obesity  BMI 39.0-39.9,adult    Obesity Treatment / Action Plan:  Patient will work on garnering support from family and friends to begin weight loss journey. Will work on eliminating or reducing the presence of highly palatable, calorie dense foods in the home. Will complete provided nutritional and psychosocial assessment questionnaire before the next appointment. Will be scheduled for indirect calorimetry to determine resting energy expenditure in a fasting state.  This will allow [place] to create a reduced calorie, high-protein meal plan to promote loss of fat mass while preserving muscle mass.  Obesity Education Performed Today:  She was weighed on the bioimpedance scale and results were discussed and documented in the synopsis.  We discussed obesity as a disease and the importance of a more detailed evaluation of all the factors contributing to the disease.  We discussed the importance of long term lifestyle changes which include nutrition, exercise and behavioral modifications as well as the importance of customizing this to her specific health and social needs.  We discussed the benefits of reaching a healthier weight to alleviate the symptoms of existing conditions and reduce the risks of the biomechanical, metabolic and psychological effects of obesity.  Wanda Torres appears to be in the action stage of change and  states they are ready to start intensive lifestyle modifications and behavioral modifications.  30 minutes was spent today on this visit including the above counseling, pre-visit chart review, and post-visit documentation.  Reviewed by clinician on day of visit: allergies, medications, problem list, medical history, surgical history, family history, social history, and previous encounter notes pertinent to obesity diagnosis.    Ailene Rud Promise Weldin FNP-C

## 2023-03-24 ENCOUNTER — Ambulatory Visit: Payer: Medicaid Other | Admitting: Family Medicine

## 2023-04-06 ENCOUNTER — Ambulatory Visit (INDEPENDENT_AMBULATORY_CARE_PROVIDER_SITE_OTHER): Payer: Medicaid Other | Admitting: Bariatrics

## 2023-04-06 ENCOUNTER — Encounter: Payer: Self-pay | Admitting: Bariatrics

## 2023-04-06 VITALS — BP 123/84 | HR 106 | Temp 97.7°F | Ht 72.0 in | Wt 288.0 lb

## 2023-04-06 DIAGNOSIS — E282 Polycystic ovarian syndrome: Secondary | ICD-10-CM

## 2023-04-06 DIAGNOSIS — E781 Pure hyperglyceridemia: Secondary | ICD-10-CM | POA: Diagnosis not present

## 2023-04-06 DIAGNOSIS — R5383 Other fatigue: Secondary | ICD-10-CM

## 2023-04-06 DIAGNOSIS — E538 Deficiency of other specified B group vitamins: Secondary | ICD-10-CM | POA: Diagnosis not present

## 2023-04-06 DIAGNOSIS — Z1331 Encounter for screening for depression: Secondary | ICD-10-CM | POA: Diagnosis not present

## 2023-04-06 DIAGNOSIS — R0602 Shortness of breath: Secondary | ICD-10-CM | POA: Diagnosis not present

## 2023-04-06 DIAGNOSIS — Z6839 Body mass index (BMI) 39.0-39.9, adult: Secondary | ICD-10-CM

## 2023-04-06 DIAGNOSIS — Z0000 Encounter for general adult medical examination without abnormal findings: Secondary | ICD-10-CM | POA: Insufficient documentation

## 2023-04-06 DIAGNOSIS — E669 Obesity, unspecified: Secondary | ICD-10-CM

## 2023-04-07 ENCOUNTER — Other Ambulatory Visit: Payer: Self-pay | Admitting: Family Medicine

## 2023-04-07 ENCOUNTER — Telehealth: Payer: Self-pay

## 2023-04-07 ENCOUNTER — Other Ambulatory Visit (INDEPENDENT_AMBULATORY_CARE_PROVIDER_SITE_OTHER): Payer: Self-pay | Admitting: Bariatrics

## 2023-04-07 ENCOUNTER — Ambulatory Visit (INDEPENDENT_AMBULATORY_CARE_PROVIDER_SITE_OTHER): Payer: Medicaid Other | Admitting: Family Medicine

## 2023-04-07 ENCOUNTER — Encounter: Payer: Self-pay | Admitting: Family Medicine

## 2023-04-07 VITALS — BP 128/86 | HR 114 | Temp 98.5°F | Resp 20 | Ht 72.0 in | Wt 290.5 lb

## 2023-04-07 DIAGNOSIS — F900 Attention-deficit hyperactivity disorder, predominantly inattentive type: Secondary | ICD-10-CM

## 2023-04-07 DIAGNOSIS — R000 Tachycardia, unspecified: Secondary | ICD-10-CM

## 2023-04-07 DIAGNOSIS — R21 Rash and other nonspecific skin eruption: Secondary | ICD-10-CM | POA: Diagnosis not present

## 2023-04-07 DIAGNOSIS — J029 Acute pharyngitis, unspecified: Secondary | ICD-10-CM | POA: Diagnosis not present

## 2023-04-07 LAB — LIPID PANEL WITH LDL/HDL RATIO
Cholesterol, Total: 112 mg/dL (ref 100–199)
HDL: 61 mg/dL (ref >39)
Triglycerides: 677 mg/dL (ref 0–149)

## 2023-04-07 LAB — VITAMIN B12: Vitamin B-12: 1382 pg/mL — ABNORMAL HIGH (ref 232–1245)

## 2023-04-07 LAB — INSULIN, RANDOM: INSULIN: 6 u[IU]/mL (ref 2.6–24.9)

## 2023-04-07 MED ORDER — AMOXICILLIN 875 MG PO TABS
875.0000 mg | ORAL_TABLET | Freq: Two times a day (BID) | ORAL | 0 refills | Status: AC
Start: 2023-04-07 — End: 2023-04-17

## 2023-04-07 MED ORDER — TRIAMCINOLONE ACETONIDE 0.1 % EX CREA
1.0000 | TOPICAL_CREAM | Freq: Every day | CUTANEOUS | 0 refills | Status: DC
Start: 2023-04-07 — End: 2023-07-28

## 2023-04-07 MED ORDER — MUPIROCIN 2 % EX OINT
TOPICAL_OINTMENT | Freq: Every day | CUTANEOUS | 0 refills | Status: DC
Start: 2023-04-07 — End: 2023-07-28

## 2023-04-07 MED ORDER — PROPRANOLOL HCL 10 MG PO TABS
10.0000 mg | ORAL_TABLET | Freq: Two times a day (BID) | ORAL | 0 refills | Status: DC
Start: 2023-04-07 — End: 2023-05-02

## 2023-04-07 MED ORDER — FENOFIBRATE 145 MG PO TABS: 145.0000 mg | ORAL_TABLET | Freq: Every day | ORAL | 0 refills | Status: DC

## 2023-04-07 NOTE — Progress Notes (Signed)
Chief Complaint:   OBESITY Wanda Torres (MR# [id number]) is a 32 y.o. female who presents for evaluation and treatment of obesity and related comorbidities. Current BMI is Body mass index is 39.06 kg/m. Wanda Torres has been struggling with her weight for many years and has been unsuccessful in either losing weight, maintaining weight loss, or reaching her healthy weight goal.  Patient saw Irene Limbo, FNP on 03/20/2023 for information session.  Wanda Torres is currently in the action stage of change and ready to dedicate time achieving and maintaining a healthier weight. Wanda Torres is interested in becoming our patient and working on intensive lifestyle modifications including (but not limited to) diet and exercise for weight loss.  Wanda Torres's habits were reviewed today and are as follows: Her family eats meals together, she thinks her family will eat healthier with her, her desired weight loss is 108 lbs, she started gaining weight 2018, her heaviest weight ever was 300 pounds, she has significant food cravings issues, she snacks frequently in the evenings, she wakes up frequently in the middle of the night to eat, she skips meals frequently, she is trying to follow a vegetarian diet, she is frequently drinking liquids with calories, and she struggles with emotional eating.  Depression Screen Saori's Food and Mood (modified PHQ-9) score was 16.  Subjective:   1. Other fatigue Wanda Torres admits to daytime somnolence and admits to waking up still tired. Patient has a history of symptoms of daytime fatigue, morning fatigue, and morning headache. Wanda Torres generally gets 7 or 8 hours of sleep per night, and states that she has nightime awakenings. Snoring is present. Apneic episodes are present. Epworth Sleepiness Score is 11.   2. SOB (shortness of breath) on exertion Wanda Torres notes increasing shortness of breath with exercising and seems to be worsening over time with weight  gain. She notes getting out of breath sooner with activity than she used to. This has not gotten worse recently. Wanda Torres denies shortness of breath at rest or orthopnea.  3. Hypertriglyceridemia Patient is not fasting.  Triglycerides 409.  4. B12 deficiency Patient takes vitamin D and sublingual B12.  5. Health care maintenance Obesity.  6. PCOS (polycystic ovarian syndrome) Patient took metformin spirolactone.  No current medications.  Assessment/Plan:   1. Other fatigue Wanda Torres does feel that her weight is causing her energy to be lower than it should be. Fatigue may be related to obesity, depression or many other causes. Labs will be ordered, and in the meanwhile, Wanda Torres will focus on self care including making healthy food choices, increasing physical activity and focusing on stress reduction.  - EKG 12-Lead  2. SOB (shortness of breath) on exertion Wanda Torres does feel that she gets out of breath more easily that she used to when she exercises. Wanda Torres's shortness of breath appears to be obesity related and exercise induced. She has agreed to work on weight loss and gradually increase exercise to treat her exercise induced shortness of breath. Will continue to monitor closely.  3. Hypertriglyceridemia Check labs today.  - Lipid Panel With LDL/HDL Ratio  4. B12 deficiency Check labs today.  - Vitamin B12  5. Health care maintenance Check labs, EKG, IC  today.  - Insulin, random - Vitamin B12 - Lipid Panel With LDL/HDL Ratio  6. PCOS (polycystic ovarian syndrome) Eat small frequent meals.  Will always eat breakfast.  7. Depression screening Wanda Torres had a positive depression screening. Depression is commonly associated with obesity and often results in emotional eating  behaviors. We will monitor this closely and work on CBT to help improve the non-hunger eating patterns. Referral to Psychology may be required if no improvement is seen as she continues in our  clinic.  8. Generalized obesity  9. BMI 39.0-39.9,adult 1.  Meal planning. 2.  Intentional eating. 3.  Reviewed labs with patient from 03/10/2023.  CMP, lipid, vitamin D, CBC, A1c and thyroid panel. 4.  No missed meals.  Wanda Torres is currently in the action stage of change and her goal is to continue with weight loss efforts. I recommend Wanda Torres begin the structured treatment plan as follows:  She has agreed to the Category 4 Plan.  Exercise goals: All adults should avoid inactivity. Some physical activity is better than none, and adults who participate in any amount of physical activity gain some health benefits.   Behavioral modification strategies: increasing lean protein intake, decreasing simple carbohydrates, increasing vegetables, increasing water intake, decreasing eating out, no skipping meals, meal planning and cooking strategies, keeping healthy foods in the home, and planning for success.  She was informed of the importance of frequent follow-up visits to maximize her success with intensive lifestyle modifications for her multiple health conditions. She was informed we would discuss her lab results at her next visit unless there is a critical issue that needs to be addressed sooner. Wanda Torres agreed to keep her next visit at the agreed upon time to discuss these results.  Objective:   Blood pressure 123/84, pulse (!) 106, temperature 97.7 F (36.5 C), height 6' (1.829 m), weight 288 lb (130.6 kg), last menstrual period 03/01/2020, SpO2 97 %. Body mass index is 39.06 kg/m.  EKG: Normal sinus rhythm, rate 99 bpm  Indirect Calorimeter completed today shows a VO2 of 359 and a REE of 2477.  Her calculated basal metabolic rate is 7893 thus her basal metabolic rate is better than expected.  General: Cooperative, alert, well developed, in no acute distress. HEENT: Conjunctivae and lids unremarkable. Cardiovascular: Regular rhythm.  Lungs: Normal work of breathing. Neurologic:  No focal deficits.   Lab Results  Component Value Date   CREATININE 0.80 03/10/2023   BUN 15 03/10/2023   NA 139 03/10/2023   K 4.2 03/10/2023   CL 101 03/10/2023   CO2 22 03/10/2023   Lab Results  Component Value Date   ALT 29 03/10/2023   AST 26 03/10/2023   ALKPHOS 68 03/10/2023   BILITOT 0.5 03/10/2023   Lab Results  Component Value Date   HGBA1C 4.8 03/10/2023   HGBA1C 4.4 (L) 07/09/2020   Lab Results  Component Value Date   INSULIN 6.0 04/06/2023   Lab Results  Component Value Date   TSH 3.450 03/10/2023   Lab Results  Component Value Date   CHOL 112 04/06/2023   HDL 61 04/06/2023   LDLCALC Comment (A) 04/06/2023   TRIG 677 (HH) 04/06/2023   CHOLHDL 2.1 03/10/2023   Lab Results  Component Value Date   WBC 5.3 03/10/2023   HGB 15.0 03/10/2023   HCT 43.9 03/10/2023   MCV 98 (H) 03/10/2023   PLT 254 03/10/2023   Lab Results  Component Value Date   IRON 79 05/01/2018   Attestation Statements:   Reviewed by clinician on day of visit: allergies, medications, problem list, medical history, surgical history, family history, social history, and previous encounter notes.  Silvana Newness, am acting as Energy manager for [employer], DO.  I have reviewed the above documentation for accuracy and completeness, and I agree with  the above. Corinna Capra- Yanixan Mellinger, DO

## 2023-04-07 NOTE — Progress Notes (Signed)
Acute Office Visit  Subjective:     Patient ID: Wanda Torres, female    DOB: 13-Aug-1991, 32 y.o.   MRN: [id number]  Chief Complaint  Patient presents with   Medication Problem    Patient states that her Psychiatrist wanted her to start a medication to help lower her Pulse and BP she states that the medication Adderal will increase her pulse so he wanted her to see what she could take. She also states that her kids have strep they tested positive last Friday and now she has symptoms of a sore throat and a rash around her mouth     HPI Patient is in today for acute visit.  Pt reports she has seen her mental health provider and was concerned with her pulse. She is taking Adderall for ADHD and is looking to increase the dose. She reports the [facility] provider won't increase the dose until she is seen to get on medicine to help with the heart rate.   She reports her kids were diagnosed with strep. They are 63 and 33 years old. She has had sore throat in the morning with cough for the last 2 days. No fever, some rhinorrhea. She also has rash around her mouth. She does have hx of psoriasis. Rash itchy and burning.   Patient Active Problem List   Diagnosis Date Noted   Generalized obesity 04/06/2023   BMI 39.0-39.9,adult 04/06/2023   Health care maintenance 04/06/2023   B12 deficiency 04/06/2023   Hypertriglyceridemia 04/06/2023   SOB (shortness of breath) on exertion 04/06/2023   Other fatigue 04/06/2023   Medication side effects 02/17/2022   Generalized anxiety disorder 05/28/2021   Bipolar disorder, current episode mixed, moderate 03/26/2021   Mixed bipolar affective disorder 03/26/2021   Insomnia 10/25/2020   MDD (major depressive disorder), recurrent episode, severe 09/16/2020   Recurrent major depressive disorder, in partial remission 08/26/2020   Major depressive disorder, recurrent episode, severe 07/10/2020   S/P right knee surgery 01/02/2020   Rupture of anterior cruciate  ligament of right knee 12/06/2019   Sprain of medial collateral ligament of right knee 12/06/2019   Biological false positive RPR test 02/20/2019   Anxiety 01/03/2019   History of preterm premature rupture of membranes (PROM) in previous pregnancy, currently pregnant in first trimester 08/09/2018   Short interval between pregnancies affecting pregnancy in first trimester, antepartum 08/09/2018   Acute pain of right knee 06/06/2018   Anxiety and depression 10/17/2016   Attention deficit hyperactivity disorder (ADHD), predominantly inattentive type 10/17/2016   PCOS (polycystic ovarian syndrome) 03/26/2016   Acne 02/19/2016    Review of Systems  Constitutional:  Negative for fever.  HENT:  Positive for sore throat.   Respiratory:  Positive for cough.   Skin:  Positive for itching and rash.  All other systems reviewed and are negative.       Objective:    BP 128/86   Pulse (!) 114   Temp 98.5 F (36.9 C) (Oral)   Resp 20   Ht 6' (1.829 m)   Wt 290 lb 8 oz (131.8 kg)   LMP 03/01/2020 (Approximate)   SpO2 96%   BMI 39.40 kg/m    Physical Exam Vitals and nursing note reviewed.  Constitutional:      Appearance: She is obese.  HENT:     Head: Normocephalic and atraumatic.     Right Ear: External ear normal.     Left Ear: External ear normal.     Nose:  Nose normal.     Mouth/Throat:     Mouth: Mucous membranes are moist.  Eyes:     Pupils: Pupils are equal, round, and reactive to light.  Cardiovascular:     Rate and Rhythm: Regular rhythm. Tachycardia present.     Pulses: Normal pulses.     Heart sounds: Normal heart sounds.  Pulmonary:     Effort: Pulmonary effort is normal.     Breath sounds: Normal breath sounds.  Abdominal:     General: Abdomen is flat. Bowel sounds are normal.  Skin:    General: Skin is warm.     Capillary Refill: Capillary refill takes less than 2 seconds.  Neurological:     General: No focal deficit present.     Mental Status: She is  alert and oriented to person, place, and time. Mental status is at baseline.  Psychiatric:        Mood and Affect: Mood normal.        Behavior: Behavior normal.        Thought Content: Thought content normal.        Judgment: Judgment normal.     No results found for any visits on 04/07/23.      Assessment & Plan:   Problem List Items Addressed This Visit   None   No orders of the defined types were placed in this encounter. Attention deficit hyperactivity disorder (ADHD), predominantly inattentive type  Tachycardia -     Propranolol HCl; Take 1 tablet (10 mg total) by mouth 2 (two) times daily.  Dispense: 60 tablet; Refill: 0  -discussed with pt that the tachycardia is common from the stimulant medicines used for ADHD. We can try a BB but have advised her on possible side effects from this treatment as well including fatigue, weight gain. Pt is aware and would like to proceed with trying one to help lower her heart rate. Her blood pressure is normal so will use lowest dose of Propranolol 10 mg BID. Pt to follow up in 3 weeks.  Pharyngitis, unspecified etiology -     Amoxicillin; Take 1 tablet (875 mg total) by mouth 2 (two) times daily for 10 days.  Dispense: 20 tablet; Refill: 0  Pt with recent exposure in kids with strep, now with symptoms. Treat with Amxocillin 875mg  x 10 days  Rash and nonspecific skin eruption -     Mupirocin; Apply topically daily.  Dispense: 30 g; Refill: 0 -     Triamcinolone Acetonide; Apply 1 Application topically daily. To affected area  Dispense: 30 g; Refill: 0  -rash likely not from strep as it's only located periorally. Treat with bactroban and mild steroid cream for now. See back at follow up sooner if condition worsens.     No follow-ups on file.  Suzan Slick, MD

## 2023-04-07 NOTE — Telephone Encounter (Signed)
-----   Message from Roswell Nickel, DO sent at 04/07/2023 11:45 AM EDT ----- Regarding: Triglycerides Call pt. Her triglycerides are extremely high. They are higher than previously when she had not fasted. I need to put her on a medication right away, as she is at risk for pancreatitis. I would like to begin Fenofibrate. Can I send in a prescription for her ? Which pharmacy?  ----- Message ----- From: [facility] Lab Results In Sent: 04/07/2023   5:38 AM EDT To: Roswell Nickel, DO

## 2023-04-07 NOTE — Telephone Encounter (Signed)
Notified patient of Lab results per Dr. Manson Passey. Patient verbalized understanding. (Pharmacy confirmed with patient, [facility] [address] [place])

## 2023-04-20 ENCOUNTER — Ambulatory Visit: Payer: Medicaid Other | Admitting: Nurse Practitioner

## 2023-04-20 ENCOUNTER — Other Ambulatory Visit: Payer: Self-pay | Admitting: Nurse Practitioner

## 2023-04-20 ENCOUNTER — Encounter: Payer: Self-pay | Admitting: Nurse Practitioner

## 2023-04-20 VITALS — BP 124/85 | HR 82 | Temp 98.3°F | Ht 72.0 in | Wt 289.0 lb

## 2023-04-20 DIAGNOSIS — E282 Polycystic ovarian syndrome: Secondary | ICD-10-CM | POA: Diagnosis not present

## 2023-04-20 DIAGNOSIS — Z6839 Body mass index (BMI) 39.0-39.9, adult: Secondary | ICD-10-CM

## 2023-04-20 DIAGNOSIS — E781 Pure hyperglyceridemia: Secondary | ICD-10-CM | POA: Diagnosis not present

## 2023-04-20 DIAGNOSIS — E669 Obesity, unspecified: Secondary | ICD-10-CM

## 2023-04-20 MED ORDER — METFORMIN HCL 500 MG PO TABS
500.0000 mg | ORAL_TABLET | Freq: Every day | ORAL | 0 refills | Status: DC
Start: 2023-04-20 — End: 2023-04-20

## 2023-04-20 NOTE — Progress Notes (Signed)
Office: [phone]  /  Fax: [phone]  WEIGHT SUMMARY AND BIOMETRICS  No data recorded Weight Gained Since Last Visit: 1lb   Vitals Temp: 98.3 F (36.8 C) BP: 124/85 Pulse Rate: 82 SpO2: 98 %   Anthropometric Measurements Height: 6' (1.829 m) Weight: 289 lb (131.1 kg) BMI (Calculated): 39.19 Weight at Last Visit: 288lb Weight Gained Since Last Visit: 1lb Starting Weight: 288lb Total Weight Loss (lbs): 0 lb (0 kg)   Body Composition  Body Fat %: 46.7 % Fat Mass (lbs): 135 lbs Muscle Mass (lbs): 148.4 lbs Total Body Water (lbs): 97.8 lbs Visceral Fat Rating : 11   Other Clinical Data Fasting: No Today's Visit #: 2 Starting Date: 04/06/23     HPI  Chief Complaint: OBESITY  Wanda Torres is here to discuss her progress with her obesity treatment plan. She is on the the Category 4 Plan and states she is following her eating plan approximately 100 % of the time. She states she is exercising 60 minutes 5 days per week.  Interval History:  Since last office visit she has gained 1 pound.  She is struggling with eating all the food on her meal plan. She is drinking water, liquid IV and a protein shake daily.   She is walking more and is planning to start working out with a Systems analyst.   Pharmacotherapy for weight loss: She is not currently taking medications  for medical weight loss.    Previous pharmacotherapy for medical weight loss:  none  Bariatric surgery:  Patient never had bariatric surgery.   Hyperlipidemia Medication(s): Started on Tricor  since her last visit. Denies side effects.   FH:  mgf with HLD with MI, CVA  Lab Results  Component Value Date   CHOL 112 04/06/2023   HDL 61 04/06/2023   LDLCALC Comment (A) 04/06/2023   TRIG 677 (HH) 04/06/2023   CHOLHDL 2.1 03/10/2023   Lab Results  Component Value Date   ALT 29 03/10/2023   AST 26 03/10/2023   ALKPHOS 68 03/10/2023   BILITOT 0.5 03/10/2023   The ASCVD Risk score (Arnett  DK, et al., 2019) failed to calculate for the following reasons:   The 2019 ASCVD risk score is only valid for ages 38 to 48   PCOS Took Metformin in the past and did well. Requesting to restart taking it.    PHYSICAL EXAM:  Blood pressure 124/85, pulse 82, temperature 98.3 F (36.8 C), height 6' (1.829 m), weight 289 lb (131.1 kg), SpO2 98 %. Body mass index is 39.2 kg/m.  General: She is overweight, cooperative, alert, well developed, and in no acute distress. PSYCH: Has normal mood, affect and thought process.   Extremities: No edema.  Neurologic: No gross sensory or motor deficits. No tremors or fasciculations noted.    DIAGNOSTIC DATA REVIEWED:  BMET    Component Value Date/Time   NA 139 03/10/2023 1438   K 4.2 03/10/2023 1438   CL 101 03/10/2023 1438   CO2 22 03/10/2023 1438   GLUCOSE 88 03/10/2023 1438   GLUCOSE 63 (L) 09/15/2020 2229   BUN 15 03/10/2023 1438   CREATININE 0.80 03/10/2023 1438   CALCIUM 9.5 03/10/2023 1438   GFRNONAA >60 09/15/2020 2229   GFRAA >60 09/15/2020 2229   Lab Results  Component Value Date   HGBA1C 4.8 03/10/2023   HGBA1C 4.4 (L) 07/09/2020   Lab Results  Component Value Date   INSULIN 6.0 04/06/2023   Lab Results  Component Value Date  TSH 3.450 03/10/2023   CBC    Component Value Date/Time   WBC 5.3 03/10/2023 1438   WBC 7.2 09/15/2020 2229   RBC 4.49 03/10/2023 1438   RBC 4.78 09/15/2020 2229   HGB 15.0 03/10/2023 1438   HCT 43.9 03/10/2023 1438   PLT 254 03/10/2023 1438   MCV 98 (H) 03/10/2023 1438   MCH 33.4 (H) 03/10/2023 1438   MCH 33.7 09/15/2020 2229   MCHC 34.2 03/10/2023 1438   MCHC 35.8 09/15/2020 2229   RDW 13.8 03/10/2023 1438   Iron Studies    Component Value Date/Time   IRON 79 05/01/2018 1740   Lipid Panel     Component Value Date/Time   CHOL 112 04/06/2023 0958   TRIG 677 (HH) 04/06/2023 0958   HDL 61 04/06/2023 0958   CHOLHDL 2.1 03/10/2023 1438   CHOLHDL 2.2 07/09/2020 1745   VLDL 29  07/09/2020 1745   LDLCALC Comment (A) 04/06/2023 0958   Hepatic Function Panel     Component Value Date/Time   PROT 6.9 03/10/2023 1438   ALBUMIN 4.5 03/10/2023 1438   AST 26 03/10/2023 1438   ALT 29 03/10/2023 1438   ALKPHOS 68 03/10/2023 1438   BILITOT 0.5 03/10/2023 1438   BILIDIR 0.1 07/09/2020 1745   IBILI 0.6 07/09/2020 1745      Component Value Date/Time   TSH 3.450 03/10/2023 1438   Nutritional Lab Results  Component Value Date   VD25OH 41.3 03/10/2023   VD25OH 37.1 05/01/2018     ASSESSMENT AND PLAN  TREATMENT PLAN FOR OBESITY:  Recommended Dietary Goals  Wanda Torres is currently in the action stage of change. As such, her goal is to continue weight management plan. She has agreed to the Category 4 Plan.  Behavioral Intervention  We discussed the following Behavioral Modification Strategies today: increasing lean protein intake, decreasing simple carbohydrates , increasing vegetables, increasing lower glycemic fruits, increasing fiber rich foods, avoiding skipping meals, increasing water intake, continue to practice mindfulness when eating, and planning for success.  Additional resources provided today: NA  Recommended Physical Activity Goals  Wanda Torres has been advised to work up to 150 minutes of moderate intensity aerobic activity a week and strengthening exercises 2-3 times per week for cardiovascular health, weight loss maintenance and preservation of muscle mass.   She has agreed to Continue current level of physical activity    Pharmacotherapy We discussed various medication options to help [place] with her weight loss efforts and we both agreed to start Metfomrin.  ASSOCIATED CONDITIONS ADDRESSED TODAY  Action/Plan  Hypertriglyceridemia Continue to follow up with PCP. Continue meds as directed.     PCOS (polycystic ovarian syndrome) Start Metformin 500mg  daily.  Side effects discussed.   Intensive lifestyle modifications are first line  treatment for this issue. We discussed several lifestyle modifications today and she will continue to work on diet, exercise and weight loss efforts. Orders and follow up as documented in patient record.  Counseling PCOS is a leading cause of menstrual irregularities and infertility. It is also associated with obesity, hirsutism (excessive hair growth on the face, chest, or back), and cardiovascular risk factors such as high cholesterol and insulin resistance. Insulin resistance appears to play a central role.  Women with PCOS have been shown to have impaired appetite-regulating hormones. Metformin is one medication that can improve metabolic parameters.  Women with polycystic ovary syndrome (PCOS) have an increased risk for cardiovascular disease (CVD) - European Journal of Preventive Cardiology.   Generalized obesity  BMI 39.0-39.9,adult         Return in about 3 weeks (around 05/11/2023).Marland Kitchen She was informed of the importance of frequent follow up visits to maximize her success with intensive lifestyle modifications for her multiple health conditions.   ATTESTASTION STATEMENTS:  Reviewed by clinician on day of visit: allergies, medications, problem list, medical history, surgical history, family history, social history, and previous encounter notes.    Theodis Sato. Lanaya Bennis FNP-C

## 2023-04-20 NOTE — Patient Instructions (Signed)

## 2023-04-25 ENCOUNTER — Encounter (HOSPITAL_COMMUNITY): Payer: Medicaid Other | Admitting: Student in an Organized Health Care Education/Training Program

## 2023-05-02 ENCOUNTER — Encounter: Payer: Self-pay | Admitting: Family Medicine

## 2023-05-02 ENCOUNTER — Ambulatory Visit (INDEPENDENT_AMBULATORY_CARE_PROVIDER_SITE_OTHER): Payer: Medicaid Other | Admitting: Student in an Organized Health Care Education/Training Program

## 2023-05-02 ENCOUNTER — Ambulatory Visit (INDEPENDENT_AMBULATORY_CARE_PROVIDER_SITE_OTHER): Payer: Medicaid Other | Admitting: Family Medicine

## 2023-05-02 VITALS — BP 115/81 | HR 104 | Ht 72.0 in | Wt 296.4 lb

## 2023-05-02 VITALS — BP 108/71 | HR 87 | Temp 98.3°F | Resp 18 | Ht 72.0 in | Wt 296.1 lb

## 2023-05-02 DIAGNOSIS — E781 Pure hyperglyceridemia: Secondary | ICD-10-CM | POA: Diagnosis not present

## 2023-05-02 DIAGNOSIS — F411 Generalized anxiety disorder: Secondary | ICD-10-CM

## 2023-05-02 DIAGNOSIS — R000 Tachycardia, unspecified: Secondary | ICD-10-CM | POA: Diagnosis not present

## 2023-05-02 DIAGNOSIS — G4700 Insomnia, unspecified: Secondary | ICD-10-CM

## 2023-05-02 DIAGNOSIS — F3162 Bipolar disorder, current episode mixed, moderate: Secondary | ICD-10-CM | POA: Diagnosis not present

## 2023-05-02 DIAGNOSIS — F900 Attention-deficit hyperactivity disorder, predominantly inattentive type: Secondary | ICD-10-CM

## 2023-05-02 MED ORDER — LURASIDONE HCL 80 MG PO TABS
80.0000 mg | ORAL_TABLET | Freq: Every day | ORAL | 1 refills | Status: DC
Start: 2023-05-02 — End: 2023-07-28

## 2023-05-02 MED ORDER — FENOFIBRATE 145 MG PO TABS: 145.0000 mg | ORAL_TABLET | Freq: Every day | ORAL | 1 refills | Status: DC

## 2023-05-02 MED ORDER — DULOXETINE HCL 30 MG PO CPEP
30.0000 mg | ORAL_CAPSULE | Freq: Every day | ORAL | 0 refills | Status: DC
Start: 2023-05-02 — End: 2023-07-28

## 2023-05-02 MED ORDER — PROPRANOLOL HCL 10 MG PO TABS: 10.0000 mg | ORAL_TABLET | Freq: Two times a day (BID) | ORAL | 0 refills | Status: DC

## 2023-05-02 MED ORDER — AMPHETAMINE-DEXTROAMPHET ER 30 MG PO CP24
30.0000 mg | ORAL_CAPSULE | Freq: Every day | ORAL | 0 refills | Status: DC
Start: 2023-05-02 — End: 2023-05-30

## 2023-05-02 NOTE — Progress Notes (Signed)
   Established Patient Office Visit  Subjective   Patient ID: Wanda Torres, female    DOB: [birth year]  Age: 32 y.o. MRN: [id number]  Chief Complaint  Patient presents with   Follow-up    Patient is here for a follow up appointment regarding her medication Inderal 10 mg she states that she doesn't have any issues with medication at this time    HPI  Tachycardia Pt started on Propranolol 10mg  BID for tachycardia, related to Adderall. Her psychiatrist wouldn't prescribe her higher dose until the tachycardia was resolved. She is here and tolerating medicine well. Also reports improvement in her anxiety with the use of this medicine.  She also needs Fenofibrate refilled.   Review of Systems  All other systems reviewed and are negative.     Objective:     BP 108/71   Pulse 87   Temp 98.3 F (36.8 C) (Oral)   Resp 18   Ht 6' (1.829 m)   Wt 296 lb 1.6 oz (134.3 kg)   LMP  (LMP Unknown)   SpO2 97%   BMI 40.16 kg/m    Physical Exam Vitals and nursing note reviewed.  Constitutional:      Appearance: She is obese.  HENT:     Head: Normocephalic and atraumatic.  Cardiovascular:     Rate and Rhythm: Normal rate.  Pulmonary:     Effort: Pulmonary effort is normal.  Skin:    Capillary Refill: Capillary refill takes less than 2 seconds.  Neurological:     General: No focal deficit present.     Mental Status: She is alert and oriented to person, place, and time. Mental status is at baseline.  Psychiatric:        Mood and Affect: Mood normal.        Behavior: Behavior normal.        Thought Content: Thought content normal.        Judgment: Judgment normal.     No results found for any visits on 05/02/23.    The ASCVD Risk score (Arnett DK, et al., 2019) failed to calculate for the following reasons:   The 2019 ASCVD risk score is only valid for ages 72 to 63    Assessment & Plan:   Problem List Items Addressed This Visit       Other    Hypertriglyceridemia   Relevant Medications   fenofibrate (TRICOR) 145 MG tablet   propranolol (INDERAL) 10 MG tablet   Other Visit Diagnoses     Tachycardia    -  Primary   Relevant Medications   propranolol (INDERAL) 10 MG tablet      Tachycardia -     Propranolol HCl; Take 1 tablet (10 mg total) by mouth 2 (two) times daily.  Dispense: 180 tablet; Refill: 0  Hypertriglyceridemia -     Fenofibrate; Take 1 tablet (145 mg total) by mouth daily.  Dispense: 90 tablet; Refill: 1  Continue Propranolol 10mg  bid for tachycardia. Heart rate 80s. To monitor blood pressure also while taking this medicine. Discussed warning signs such as lightheadedness when standing, fatigue, dizziness to name a few.  Also refilled Fenofibrate 145 mg daily.   Return for next scheduled.    Suzan Slick, MD

## 2023-05-02 NOTE — Progress Notes (Signed)
BH MD/PA/NP OP Progress Note  05/02/2023 3:14 PM Wanda Torres  MRN:  [id number]  Chief Complaint:  Chief Complaint  Patient presents with   Follow-up   ADHD   HPI:  Wanda Torres is a 32 yr old female who presents Follow Up and Medication Management. PPHx is significant for Bipolar Disorder, GAD, ADHD, and 1 Suicide Attempt (cut self with knife- 06/2020) and 1 Psychiatric Hospitalization [facility]- 06/2020).    She reports that she saw her PCP and has been started on Propanolol and had a good response to it.  She reports that her anxiety has significantly decreased since starting the propranolol.  She reports that when she was at her PCP office earlier this morning her heart rate was 87 but that it is elevated now because she is nervous.  Discussed with her that since her blood pressure and heart rate have responded well to the propranolol we could trial an increase in her Adderall.  Discussed with her that we would need to be cautious so as not to cause mood destabilization and she was agreeable with this.  Discussed that we would not make any other changes to her medication at this time.  She reports no side effects to her other medications.  She reports her mood has remained stable.  She reports no SI, HI, or AVH.  She reports sleep is good.  She reports her appetite is doing good.  She reports no other concerns at present.  She will return for follow-up approximately 4 weeks.  Discussed with patient that Resident Provider would be transitioning their care to another Resident Provider, Dr. Hazle Quant, starting July 2024.  She reported understanding and had no concerns.    Visit Diagnosis:    ICD-10-CM   1. Attention deficit hyperactivity disorder (ADHD), predominantly inattentive type  F90.0 amphetamine-dextroamphetamine (ADDERALL XR) 30 MG 24 hr capsule    2. Generalized anxiety disorder  F41.1 DULoxetine (CYMBALTA) 30 MG capsule    3. Bipolar disorder, current episode mixed, moderate  (HCC)  F31.62 DULoxetine (CYMBALTA) 30 MG capsule    lurasidone (LATUDA) 80 MG TABS tablet    4. Insomnia, unspecified type  G47.00       Past Psychiatric History: Bipolar Disorder, GAD, ADHD, and 1 Suicide Attempt (cut self with knife- 06/2020) and 1 Psychiatric Hospitalization [facility]- 06/2020).    Past Medical History:  Past Medical History:  Diagnosis Date   Acne    ADD (attention deficit disorder)    Alcohol abuse    Allergy    Anemia    Anxiety    ASCUS with positive high risk human papillomavirus of vagina 05/2022   Bipolar 1 disorder (HCC)    Depression    Depression    Drug use    Hypothyroidism    Joint pain    PCOS (polycystic ovarian syndrome)    Psoriasis    Supervision of other high risk pregnancy, antepartum 01/19/2019   Formatting of this note might be different from the original. Partner prefers She/Her pronouns   Swelling of lower extremity    Vitamin B 12 deficiency    Vitamin D deficiency     Past Surgical History:  Procedure Laterality Date   COLPOSCOPY  07/2022   ENDOCERVICAL MUCOSA WITH CHRONIC CERVICITIS.   NO SQUAMOUS MUCOSA IDENTIFIED.   NO EVIDENCE OF DYSPLASIA OR MALIGNANCY.    Family Psychiatric History: None   Family History:  Family History  Problem Relation Age of Onset   Anxiety  disorder Mother    Depression Mother    Sexual abuse Mother    Hypothyroidism Mother    High blood pressure Mother    High Cholesterol Mother    Drug abuse Father    ADD / ADHD Sister    Anxiety disorder Sister    Bipolar disorder Sister    Depression Sister    Drug abuse Sister    Sexual abuse Sister    Anxiety disorder Brother    Depression Brother    Sexual abuse Brother    Thyroid disease Maternal Grandmother        THyroid cancer   Depression Maternal Grandmother    Sexual abuse Maternal Grandmother    Alcohol abuse Maternal Grandfather    Dementia Maternal Grandfather    Depression Maternal Grandfather    Sexual abuse Maternal Grandfather     Alcohol abuse Maternal Aunt    Bipolar disorder Maternal Aunt    Depression Maternal Aunt    Sexual abuse Maternal Aunt    Bipolar disorder Maternal Aunt    Depression Maternal Aunt    Sexual abuse Maternal Aunt    ADD / ADHD Maternal Uncle    Alcohol abuse Maternal Uncle    Drug abuse Maternal Uncle    Sexual abuse Maternal Uncle     Social History:  Social History   Socioeconomic History   Marital status: Divorced    Spouse name: Not on file   Number of children: 4   Years of education: Not on file   Highest education level: Some college, no degree  Occupational History    Comment: Supervisor reservations; 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[employer]  Tobacco Use   Smoking status: Never   Smokeless tobacco: Never   Tobacco comments:    Vaps daily   Vaping Use   Vaping Use: Every day   Substances: Nicotine  Substance and Sexual Activity   Alcohol use: Yes    Alcohol/week: 1.0 standard drink of alcohol    Types: 1 Standard drinks or equivalent per week    Comment: reports one a week   Drug use: Never   Sexual activity: Not on file  Other Topics Concern   Not on file  Social History Narrative   Not on file   Social Determinants of Health   Financial Resource Strain: Low Risk  (03/23/2023)   Overall Financial Resource Strain (CARDIA)    Difficulty of Paying Living Expenses: Not hard at all  Food Insecurity: Food Insecurity Present (03/23/2023)   Hunger Vital Sign    Worried About Running Out of Food in the Last Year: Sometimes true    Ran Out of Food in the Last Year: Never true  Transportation Needs: No Transportation Needs (03/23/2023)   PRAPARE - Administrator, Civil Service (Medical): No    Lack of Transportation (Non-Medical): No  Physical Activity: Insufficiently Active (03/23/2023)   Exercise Vital Sign    Days of Exercise per Week: 3 days    Minutes of Exercise per Session: 30 min  Stress: Stress Concern Present (03/23/2023)   [employer] of  Occupational Health - Occupational Stress Questionnaire    Feeling of Stress : To some extent  Social Connections: Moderately Isolated (03/23/2023)   Social Connection and Isolation Panel [NHANES]    Frequency of Communication with Friends and Family: More than three times a week    Frequency of Social Gatherings with Friends and Family: Once a week    Attends Religious Services: Never  Active Member of Clubs or Organizations: No    Attends Banker Meetings: Not on file    Marital Status: Living with partner    Allergies: No Known Allergies  Metabolic Disorder Labs: Lab Results  Component Value Date   HGBA1C 4.8 03/10/2023   MPG 79.58 07/09/2020   No results found for: "PROLACTIN" Lab Results  Component Value Date   CHOL 112 04/06/2023   TRIG 677 (HH) 04/06/2023   HDL 61 04/06/2023   CHOLHDL 2.1 03/10/2023   VLDL 29 07/09/2020   LDLCALC Comment (A) 04/06/2023   LDLCALC 9 03/10/2023   Lab Results  Component Value Date   TSH 3.450 03/10/2023   TSH 2.264 07/09/2020    Therapeutic Level Labs: No results found for: "LITHIUM" No results found for: "VALPROATE" No results found for: "CBMZ"  Current Medications: Current Outpatient Medications  Medication Sig Dispense Refill   amphetamine-dextroamphetamine (ADDERALL XR) 30 MG 24 hr capsule Take 1 capsule (30 mg total) by mouth daily. 30 capsule 0   Cetirizine HCl (ZYRTEC ALLERGY PO) Take by mouth.     Cholecalciferol (VITAMIN D-3 PO) Take by mouth.     Cyanocobalamin (VITAMIN B-12 PO) Take by mouth.     DULoxetine (CYMBALTA) 30 MG capsule Take 1 capsule (30 mg total) by mouth daily. For depression 90 capsule 0   fenofibrate (TRICOR) 145 MG tablet Take 1 tablet (145 mg total) by mouth daily. 90 tablet 1   gabapentin (NEURONTIN) 100 MG capsule Take 1 capsule (100 mg total) by mouth 3 (three) times daily. 270 capsule 0   lurasidone (LATUDA) 80 MG TABS tablet Take 1 tablet (80 mg total) by mouth daily with  breakfast. 30 tablet 1   metFORMIN (GLUCOPHAGE) 500 MG tablet TAKE 1 TABLET(500 MG) BY MOUTH DAILY WITH BREAKFAST 90 tablet 0   mupirocin ointment (BACTROBAN) 2 % Apply topically daily. 30 g 0   propranolol (INDERAL) 10 MG tablet Take 1 tablet (10 mg total) by mouth 2 (two) times daily. 180 tablet 0   traZODone (DESYREL) 50 MG tablet Take 1 tablet (50 mg total) by mouth at bedtime as needed. for sleep 90 tablet 1   triamcinolone cream (KENALOG) 0.1 % Apply 1 Application topically daily. To affected area 30 g 0   No current facility-administered medications for this visit.     Musculoskeletal: Strength & Muscle Tone: within normal limits Gait & Station: normal Patient leans: N/A  Psychiatric Specialty Exam: Review of Systems  Respiratory:  Negative for shortness of breath.   Cardiovascular:  Negative for chest pain.  Gastrointestinal:  Negative for abdominal pain, constipation, diarrhea, nausea and vomiting.  Neurological:  Negative for dizziness, weakness and headaches.  Psychiatric/Behavioral:  Negative for dysphoric mood, hallucinations, sleep disturbance and suicidal ideas. The patient is not nervous/anxious.     Blood pressure 115/81, pulse (!) 104, height 6' (1.829 m), weight 296 lb 6.4 oz (134.4 kg).Body mass index is 40.2 kg/m.  General Appearance: Casual and Fairly Groomed  Eye Contact:  Good  Speech:  Clear and Coherent and Normal Rate  Volume:  Normal  Mood:  Euthymic  Affect:  Appropriate and Congruent  Thought Process:  Coherent and Goal Directed  Orientation:  Full (Time, Place, and Person)  Thought Content: WDL and Logical   Suicidal Thoughts:  No  Homicidal Thoughts:  No  Memory:  Immediate;   Good Recent;   Good  Judgement:  Good  Insight:  Good  Psychomotor Activity:  Normal  Concentration:  Concentration: Good and Attention Span: Good  Recall:  Good  Fund of Knowledge: Good  Language: Good  Akathisia:  Negative  Handed:  Right  AIMS (if indicated):  not done  Assets:  Communication Skills Desire for Improvement Financial Resources/Insurance Housing Resilience Social Support Vocational/Educational  ADL's:  Intact  Cognition: WNL  Sleep:  Good   Screenings: AIMS    Flowsheet Row [facility] (Discharged) [facility]  AIMS Total Score 0      AUDIT    Flowsheet Row Appointment from 03/24/2023 in [facility] Ad[facility] (Discharged[facility] Admission (Discharged) from [facility]  Alcohol Use Disorder Identification Test Final Score (AUDIT) 5 2 3       GAD-7    Flowsheet Row Office Visit from 03/10/2023 in [facility] Video Visit from 09/14/2022 in [facility] Video Visit from 08/04/2022 in [facility] Video Visit from 06/16/2022 in [facility] Video Visit from 04/14/2022 in [facility]  Total GAD-7 Score 6 5 4 1 2       PHQ2-9    Flowsheet Row Office Visit from 03/10/2023 in [facility] Video Visit from 09/14/2022 in [facility] Video Visit from 08/04/2022 in [facility] Video Visit from 06/16/2022 in [facility] Video Visit from 04/14/2022 in [facility]  PHQ-2 Total Score 1 1 1 1 3   PHQ-9 Total Score 7 -- -- -- 9      Flowsheet Row Video Visit from 09/14/2022 in [facility] Video Visit from 08/04/2022 in [facility] Video Visit from 06/16/2022 in [facility]  C-SSRS RISK CATEGORY Moderate Risk Low Risk Low Risk        Assessment and Plan:  Wanda Torres is a 32 yr old female who presents Follow Up and Medication Management. PPHx is significant for Bipolar Disorder, GAD, ADHD, and 1 Suicide Attempt (cut self with knife- 06/2020) and 1 Psychiatric Hospitalization [facility]- 06/2020).   [name] was started on Propanolol by her PCP and her HR and BP have responded well to it (her HR is elevated in clinic but earlier today was WNL at her PCP).  Due to her improvement in BP and HR we will increase her Adderall today.  We will not make any other changes to her medications at this time.  She will return for follow up in approximately 4 weeks.   Bipolar disorder, current episode mixed, moderate  GAD: -Continue Latuda 80 mg daily for mood stability.  30 tablets with 1 refill. -Continue Cymbalta 30 mg daily for depression and anxiety.  90 tablets with 0 refills. -Continue Gabapentin 100 mg TID for anxiety.  No refills sent at this time.     ADHD: -Increase Adderall XR to 30 mg daily.  30 tablets with 0 refills.     Insomnia: -Continue Trazodone 50 mg QHS PRN.  No refills sent at this time.    Collaboration of Care:   Patient/Guardian was advised Release of Information must be obtained prior to any record release in order to collaborate their care with an outside provider. Patient/Guardian was advised if they have not  already done so to contact the registration department to sign all necessary forms in order for [place] to release information regarding their care.   Consent: Patient/Guardian gives verbal consent for treatment and assignment of benefits for services provided during this visit. Patient/Guardian expressed understanding and agreed to proceed.    Lauro Franklin, MD 05/02/2023, 3:14 PM

## 2023-05-10 ENCOUNTER — Ambulatory Visit (INDEPENDENT_AMBULATORY_CARE_PROVIDER_SITE_OTHER): Payer: Medicaid Other | Admitting: Nurse Practitioner

## 2023-05-10 ENCOUNTER — Encounter: Payer: Self-pay | Admitting: Nurse Practitioner

## 2023-05-10 VITALS — BP 125/84 | HR 98 | Temp 98.0°F | Ht 72.0 in | Wt 293.0 lb

## 2023-05-10 DIAGNOSIS — E282 Polycystic ovarian syndrome: Secondary | ICD-10-CM | POA: Diagnosis not present

## 2023-05-10 DIAGNOSIS — E669 Obesity, unspecified: Secondary | ICD-10-CM | POA: Diagnosis not present

## 2023-05-10 DIAGNOSIS — Z6839 Body mass index (BMI) 39.0-39.9, adult: Secondary | ICD-10-CM | POA: Diagnosis not present

## 2023-05-10 NOTE — Progress Notes (Signed)
Office: [phone]  /  Fax: [phone]  WEIGHT SUMMARY AND BIOMETRICS  No data recorded Weight Gained Since Last Visit: 4lb   Vitals Temp: 98 F (36.7 C) BP: 125/84 Pulse Rate: 98 SpO2: 95 %   Anthropometric Measurements Height: 6' (1.829 m) Weight: 293 lb (132.9 kg) BMI (Calculated): 39.73 Weight at Last Visit: 289lb Weight Gained Since Last Visit: 4lb Starting Weight: 288lb Total Weight Loss (lbs): 0 lb (0 kg)   Body Composition  Body Fat %: 47.3 % Fat Mass (lbs): 138.6 lbs Muscle Mass (lbs): 146.6 lbs Total Body Water (lbs): 102 lbs Visceral Fat Rating : 12   Other Clinical Data Fasting: No Labs: No Today's Visit #: 3 Starting Date: 04/06/23     HPI  Chief Complaint: OBESITY  [name] is here to discuss her progress with her obesity treatment plan. She is on the the Category 4 Plan and states she is following her eating plan approximately 100 % of the time. She states she is exercising 30 minutes 2 days per week.   Interval History:  Since last office visit she has gained 4 pounds. She is averaging around [id number] calories and 160 grams of protein. BF:  eggs and sausage with protein shake, lunch:  protein shake and dinner:  protein and veggies.   Since her last visit she started working out with a Systems analyst 2 days per week.  She is drinking water and liquid IV.    Pharmacotherapy for weight loss: She is not currently taking medications  for medical weight loss.    Previous pharmacotherapy for medical weight loss:  None  Bariatric surgery:  Patient has not had bariatric surgery.    PCOS Taking Metformin 500mg  daily.  Denies side effects  PHYSICAL EXAM:  Blood pressure 125/84, pulse 98, temperature 98 F (36.7 C), height 6' (1.829 m), weight 293 lb (132.9 kg), SpO2 95 %. Body mass index is 39.74 kg/m.  General: She is overweight, cooperative, alert, well developed, and in no acute distress. PSYCH: Has normal mood, affect and  thought process.   Extremities: No edema.  Neurologic: No gross sensory or motor deficits. No tremors or fasciculations noted.    DIAGNOSTIC DATA REVIEWED:  BMET    Component Value Date/Time   NA 139 03/10/2023 1438   K 4.2 03/10/2023 1438   CL 101 03/10/2023 1438   CO2 22 03/10/2023 1438   GLUCOSE 88 03/10/2023 1438   GLUCOSE 63 (L) 09/15/2020 2229   BUN 15 03/10/2023 1438   CREATININE 0.80 03/10/2023 1438   CALCIUM 9.5 03/10/2023 1438   GFRNONAA >60 09/15/2020 2229   GFRAA >60 09/15/2020 2229   Lab Results  Component Value Date   HGBA1C 4.8 03/10/2023   HGBA1C 4.4 (L) 07/09/2020   Lab Results  Component Value Date   INSULIN 6.0 04/06/2023   Lab Results  Component Value Date   TSH 3.450 03/10/2023   CBC    Component Value Date/Time   WBC 5.3 03/10/2023 1438   WBC 7.2 09/15/2020 2229   RBC 4.49 03/10/2023 1438   RBC 4.78 09/15/2020 2229   HGB 15.0 03/10/2023 1438   HCT 43.9 03/10/2023 1438   PLT 254 03/10/2023 1438   MCV 98 (H) 03/10/2023 1438   MCH 33.4 (H) 03/10/2023 1438   MCH 33.7 09/15/2020 2229   MCHC 34.2 03/10/2023 1438   MCHC 35.8 09/15/2020 2229   RDW 13.8 03/10/2023 1438   Iron Studies    Component Value Date/Time   IRON  79 05/01/2018 1740   Lipid Panel     Component Value Date/Time   CHOL 112 04/06/2023 0958   TRIG 677 (HH) 04/06/2023 0958   HDL 61 04/06/2023 0958   CHOLHDL 2.1 03/10/2023 1438   CHOLHDL 2.2 07/09/2020 1745   VLDL 29 07/09/2020 1745   LDLCALC Comment (A) 04/06/2023 0958   Hepatic Function Panel     Component Value Date/Time   PROT 6.9 03/10/2023 1438   ALBUMIN 4.5 03/10/2023 1438   AST 26 03/10/2023 1438   ALT 29 03/10/2023 1438   ALKPHOS 68 03/10/2023 1438   BILITOT 0.5 03/10/2023 1438   BILIDIR 0.1 07/09/2020 1745   IBILI 0.6 07/09/2020 1745      Component Value Date/Time   TSH 3.450 03/10/2023 1438   Nutritional Lab Results  Component Value Date   VD25OH 41.3 03/10/2023   VD25OH 37.1 05/01/2018      ASSESSMENT AND PLAN  TREATMENT PLAN FOR OBESITY:  Recommended Dietary Goals  Wanda Torres is currently in the action stage of change. As such, her goal is to continue weight management plan. She has agreed to keeping a food journal and adhering to recommended goals of 1800 calories and 100+ protein.  Behavioral Intervention  We discussed the following Behavioral Modification Strategies today: increasing lean protein intake, decreasing simple carbohydrates , increasing vegetables, increasing lower glycemic fruits, avoiding skipping meals, increasing water intake, continue to practice mindfulness when eating, and planning for success.  Additional resources provided today: NA  Recommended Physical Activity Goals  Melenda has been advised to work up to 150 minutes of moderate intensity aerobic activity a week and strengthening exercises 2-3 times per week for cardiovascular health, weight loss maintenance and preservation of muscle mass.   She has agreed to Continue current level of physical activity     ASSOCIATED CONDITIONS ADDRESSED TODAY  Action/Plan  PCOS (polycystic ovarian syndrome) Doing well.  Continue Metformin.    Generalized obesity  BMI 39.0-39.9,adult     Discussed with patient extensively today that she is not eating enough calories.  Will work on increasing calories and meeting protein goals.  Bio Impedence reviewed with the patient today.      Return in about 2 weeks (around 05/24/2023).Marland Kitchen She was informed of the importance of frequent follow up visits to maximize her success with intensive lifestyle modifications for her multiple health conditions.   ATTESTASTION STATEMENTS:  Reviewed by clinician on day of visit: allergies, medications, problem list, medical history, surgical history, family history, social history, and previous encounter notes.   Time spent on visit including pre-visit chart review and post-visit care and charting was 30 minutes.     Theodis Sato. Jenne Sellinger FNP-C

## 2023-05-26 ENCOUNTER — Ambulatory Visit: Payer: Medicaid Other | Admitting: Family Medicine

## 2023-05-30 ENCOUNTER — Ambulatory Visit (INDEPENDENT_AMBULATORY_CARE_PROVIDER_SITE_OTHER): Payer: Medicaid Other | Admitting: Student in an Organized Health Care Education/Training Program

## 2023-05-30 ENCOUNTER — Encounter (HOSPITAL_COMMUNITY): Payer: Self-pay | Admitting: Student in an Organized Health Care Education/Training Program

## 2023-05-30 VITALS — BP 130/77 | HR 98 | Ht 72.0 in | Wt 294.5 lb

## 2023-05-30 DIAGNOSIS — F900 Attention-deficit hyperactivity disorder, predominantly inattentive type: Secondary | ICD-10-CM

## 2023-05-30 DIAGNOSIS — F411 Generalized anxiety disorder: Secondary | ICD-10-CM

## 2023-05-30 DIAGNOSIS — G4700 Insomnia, unspecified: Secondary | ICD-10-CM | POA: Diagnosis not present

## 2023-05-30 DIAGNOSIS — F3162 Bipolar disorder, current episode mixed, moderate: Secondary | ICD-10-CM

## 2023-05-30 MED ORDER — AMPHETAMINE-DEXTROAMPHET ER 30 MG PO CP24
30.0000 mg | ORAL_CAPSULE | Freq: Every day | ORAL | 0 refills | Status: DC
Start: 2023-06-06 — End: 2023-06-29

## 2023-05-30 MED ORDER — AMPHETAMINE-DEXTROAMPHETAMINE 30 MG PO TABS
30.0000 mg | ORAL_TABLET | Freq: Every day | ORAL | 0 refills | Status: DC
Start: 2023-07-04 — End: 2023-06-29

## 2023-05-30 MED ORDER — PRAZOSIN HCL 1 MG PO CAPS
1.0000 mg | ORAL_CAPSULE | Freq: Every day | ORAL | 1 refills | Status: DC
Start: 2023-05-30 — End: 2023-07-28

## 2023-05-30 MED ORDER — GABAPENTIN 100 MG PO CAPS: 100.0000 mg | ORAL_CAPSULE | Freq: Four times a day (QID) | ORAL | 1 refills | Status: DC

## 2023-05-30 NOTE — Progress Notes (Signed)
BH MD/PA/NP OP Progress Note  05/30/2023 4:18 PM Wanda Torres  MRN:  [id number]  Chief Complaint:  Chief Complaint  Patient presents with   Follow-up   ADHD   Bipolar Mixed   HPI:  Wanda Torres is a 32 yr old female who presents Follow Up and Medication Management. PPHx is significant for Bipolar Disorder, GAD, ADHD, and 1 Suicide Attempt (cut self with knife- 06/2020) and 1 Psychiatric Hospitalization [facility]- 06/2020).    She reports has had significant worsening of her anxiety due to the situation with her ex.  She reports that they went to court and the 2 youngest kids will be going with the ex to [place] and so she will only be seeing them during school breaks and 2 weeks at the beginning and end of summer.  She reports that since then she has had significant worsening of her anxiety.  She reports her sleep has taken a significant hit and she is having vivid nightmares almost every night.  She reports that she has been taking her gabapentin 4 times a day and this has helped with her anxiety and wants to know if she can continue with this until her situation settles.  She also wanted to discuss starting the medication for the nightmares as she had previously discussed this with the provider but had never wanted to go on it.  Discussed that prazosin can help with night terrors.  Discussed potential risks and side effects of the medication and she was agreeable to a trial.  Discussed would not make any other changes to her medication at this time.  She reports no SI, HI, or AVH.  She reports her sleep is poor.  She reports her appetite is poor.  She reports a cough from her recent sickness but otherwise reports no other concerns at present.  She will return for follow-up in approximately 6 to 8 weeks.  Discussed with patient that Resident Provider would be transitioning their care to another Resident Provider, Dr. Hazle Quant, starting July 2024.  She reported understanding and had no  concerns.    Visit Diagnosis:    ICD-10-CM   1. Bipolar disorder, current episode mixed, moderate (HCC)  F31.62     2. Generalized anxiety disorder  F41.1 gabapentin (NEURONTIN) 100 MG capsule    3. Attention deficit hyperactivity disorder (ADHD), predominantly inattentive type  F90.0 amphetamine-dextroamphetamine (ADDERALL XR) 30 MG 24 hr capsule    amphetamine-dextroamphetamine (ADDERALL) 30 MG tablet    4. Insomnia, unspecified type  G47.00 prazosin (MINIPRESS) 1 MG capsule       Past Psychiatric History: Bipolar Disorder, GAD, ADHD, and 1 Suicide Attempt (cut self with knife- 06/2020) and 1 Psychiatric Hospitalization [facility]- 06/2020).    Past Medical History:  Past Medical History:  Diagnosis Date   Acne    ADD (attention deficit disorder)    Alcohol abuse    Allergy    Anemia    Anxiety    ASCUS with positive high risk human papillomavirus of vagina 05/2022   Bipolar 1 disorder (HCC)    Depression    Depression    Drug use    Hypothyroidism    Joint pain    PCOS (polycystic ovarian syndrome)    Psoriasis    Supervision of other high risk pregnancy, antepartum 01/19/2019   Formatting of this note might be different from the original. Partner prefers She/Her pronouns   Swelling of lower extremity    Vitamin B 12 deficiency  Vitamin D deficiency     Past Surgical History:  Procedure Laterality Date   COLPOSCOPY  07/2022   ENDOCERVICAL MUCOSA WITH CHRONIC CERVICITIS.   NO SQUAMOUS MUCOSA IDENTIFIED.   NO EVIDENCE OF DYSPLASIA OR MALIGNANCY.    Family Psychiatric History: None   Family History:  Family History  Problem Relation Age of Onset   Anxiety disorder Mother    Depression Mother    Sexual abuse Mother    Hypothyroidism Mother    High blood pressure Mother    High Cholesterol Mother    Drug abuse Father    ADD / ADHD Sister    Anxiety disorder Sister    Bipolar disorder Sister    Depression Sister    Drug abuse Sister    Sexual abuse Sister     Anxiety disorder Brother    Depression Brother    Sexual abuse Brother    Thyroid disease Maternal Grandmother        THyroid cancer   Depression Maternal Grandmother    Sexual abuse Maternal Grandmother    Alcohol abuse Maternal Grandfather    Dementia Maternal Grandfather    Depression Maternal Grandfather    Sexual abuse Maternal Grandfather    Alcohol abuse Maternal Aunt    Bipolar disorder Maternal Aunt    Depression Maternal Aunt    Sexual abuse Maternal Aunt    Bipolar disorder Maternal Aunt    Depression Maternal Aunt    Sexual abuse Maternal Aunt    ADD / ADHD Maternal Uncle    Alcohol abuse Maternal Uncle    Drug abuse Maternal Uncle    Sexual abuse Maternal Uncle     Social History:  Social History   Socioeconomic History   Marital status: Divorced    Spouse name: Not on file   Number of children: 4   Years of education: Not on file   Highest education level: Some college, no degree  Occupational History    Comment: Supervisor reservations; 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[employer]  Tobacco Use   Smoking status: Never   Smokeless tobacco: Never   Tobacco comments:    Vaps daily   Vaping Use   Vaping Use: Every day   Substances: Nicotine  Substance and Sexual Activity   Alcohol use: Yes    Alcohol/week: 1.0 standard drink of alcohol    Types: 1 Standard drinks or equivalent per week    Comment: reports one a week   Drug use: Never   Sexual activity: Not on file  Other Topics Concern   Not on file  Social History Narrative   Not on file   Social Determinants of Health   Financial Resource Strain: Low Risk  (03/23/2023)   Overall Financial Resource Strain (CARDIA)    Difficulty of Paying Living Expenses: Not hard at all  Food Insecurity: Food Insecurity Present (03/23/2023)   Hunger Vital Sign    Worried About Running Out of Food in the Last Year: Sometimes true    Ran Out of Food in the Last Year: Never true  Transportation Needs: No Transportation Needs  (03/23/2023)   PRAPARE - Administrator, Civil Service (Medical): No    Lack of Transportation (Non-Medical): No  Physical Activity: Insufficiently Active (03/23/2023)   Exercise Vital Sign    Days of Exercise per Week: 3 days    Minutes of Exercise per Session: 30 min  Stress: Stress Concern Present (03/23/2023)   [employer] of Occupational Health - Occupational Stress  Questionnaire    Feeling of Stress : To some extent  Social Connections: Moderately Isolated (03/23/2023)   Social Connection and Isolation Panel [NHANES]    Frequency of Communication with Friends and Family: More than three times a week    Frequency of Social Gatherings with Friends and Family: Once a week    Attends Religious Services: Never    Database administrator or Organizations: No    Attends Engineer, structural: Not on file    Marital Status: Living with partner    Allergies: No Known Allergies  Metabolic Disorder Labs: Lab Results  Component Value Date   HGBA1C 4.8 03/10/2023   MPG 79.58 07/09/2020   No results found for: "PROLACTIN" Lab Results  Component Value Date   CHOL 112 04/06/2023   TRIG 677 (HH) 04/06/2023   HDL 61 04/06/2023   CHOLHDL 2.1 03/10/2023   VLDL 29 07/09/2020   LDLCALC Comment (A) 04/06/2023   LDLCALC 9 03/10/2023   Lab Results  Component Value Date   TSH 3.450 03/10/2023   TSH 2.264 07/09/2020    Therapeutic Level Labs: No results found for: "LITHIUM" No results found for: "VALPROATE" No results found for: "CBMZ"  Current Medications: Current Outpatient Medications  Medication Sig Dispense Refill   [START ON 07/04/2023] amphetamine-dextroamphetamine (ADDERALL) 30 MG tablet Take 1 tablet by mouth daily. 30 tablet 0   prazosin (MINIPRESS) 1 MG capsule Take 1 capsule (1 mg total) by mouth at bedtime. 30 capsule 1   [START ON 06/06/2023] amphetamine-dextroamphetamine (ADDERALL XR) 30 MG 24 hr capsule Take 1 capsule (30 mg total) by mouth daily.  30 capsule 0   Cetirizine HCl (ZYRTEC ALLERGY PO) Take by mouth.     Cholecalciferol (VITAMIN D-3 PO) Take by mouth.     Cyanocobalamin (VITAMIN B-12 PO) Take by mouth.     DULoxetine (CYMBALTA) 30 MG capsule Take 1 capsule (30 mg total) by mouth daily. For depression 90 capsule 0   fenofibrate (TRICOR) 145 MG tablet Take 1 tablet (145 mg total) by mouth daily. 90 tablet 1   gabapentin (NEURONTIN) 100 MG capsule Take 1 capsule (100 mg total) by mouth 4 (four) times daily. 120 capsule 1   lurasidone (LATUDA) 80 MG TABS tablet Take 1 tablet (80 mg total) by mouth daily with breakfast. 30 tablet 1   metFORMIN (GLUCOPHAGE) 500 MG tablet TAKE 1 TABLET(500 MG) BY MOUTH DAILY WITH BREAKFAST 90 tablet 0   mupirocin ointment (BACTROBAN) 2 % Apply topically daily. 30 g 0   propranolol (INDERAL) 10 MG tablet Take 1 tablet (10 mg total) by mouth 2 (two) times daily. 180 tablet 0   traZODone (DESYREL) 50 MG tablet Take 1 tablet (50 mg total) by mouth at bedtime as needed. for sleep 90 tablet 1   triamcinolone cream (KENALOG) 0.1 % Apply 1 Application topically daily. To affected area 30 g 0   No current facility-administered medications for this visit.     Musculoskeletal: Strength & Muscle Tone: within normal limits Gait & Station: normal Patient leans: N/A  Psychiatric Specialty Exam: Review of Systems  Respiratory:  Positive for cough. Negative for shortness of breath.   Cardiovascular:  Negative for chest pain.  Gastrointestinal:  Negative for abdominal pain, constipation, diarrhea, nausea and vomiting.  Neurological:  Negative for dizziness, weakness and headaches.  Psychiatric/Behavioral:  Positive for dysphoric mood and sleep disturbance. Negative for hallucinations and suicidal ideas. The patient is nervous/anxious.     Blood pressure 130/77, pulse  98, height 6' (1.829 m), weight 294 lb 8 oz (133.6 kg), SpO2 98 %.Body mass index is 39.94 kg/m.  General Appearance: Casual and Fairly  Groomed  Eye Contact:  Good  Speech:  Clear and Coherent and Normal Rate  Volume:  Normal  Mood:  Anxious and Depressed  Affect:  Congruent and Depressed  Thought Process:  Coherent and Goal Directed  Orientation:  Full (Time, Place, and Person)  Thought Content: WDL and Logical   Suicidal Thoughts:  No  Homicidal Thoughts:  No  Memory:  Immediate;   Good Recent;   Good  Judgement:  Good  Insight:  Good  Psychomotor Activity:  Normal  Concentration:  Concentration: Good and Attention Span: Good  Recall:  Good  Fund of Knowledge: Good  Language: Good  Akathisia:  Negative  Handed:  Right  AIMS (if indicated): not done  Assets:  Communication Skills Desire for Improvement Financial Resources/Insurance Housing Resilience Social Support Vocational/Educational  ADL's:  Intact  Cognition: WNL  Sleep:  Poor   Screenings: AIMS    Flowsheet Row [facility] (Dis[facility]  AIMS Total Score 0      AUDIT    Flowsheet Row Appointment from 03/24/2023 in [facility] ([facility] Ad[facility] ([facility]) [facility]  Alcohol Use Disorder Identification Test Final Score (AUDIT) 5 2 3       GAD-7    Flowsheet Row Office Visit from 03/10/2023 in [facility] Video Visit from 09/14/2022 in [facility] Video Visit from 08/04/2022 in [facility] Video Visit from 06/16/2022 in [facility] Video Visit from 04/14/2022 in [facility]  Total GAD-7 Score 6 5 4 1 2       PHQ2-9    Flowsheet Row Office Visit from 03/10/2023 in [facility] Video Visit from 09/14/2022 in [facility] Video Visit from 08/04/2022 in [facility] Video Visit from 06/16/2022 in [facility] Video Visit from 04/14/2022 in [facility]  PHQ-2 Total Score 1 1 1 1 3   PHQ-9 Total Score 7 -- -- -- 9      Flowsheet Row Video Visit from 09/14/2022 in [facility] Video Visit from 08/04/2022 in [facility] Video Visit from 06/16/2022 in [facility]  C-SSRS RISK CATEGORY Moderate Risk Low Risk Low Risk        Assessment and Plan:  Wanda Torres is a 32 yr old female who presents Follow Up and Medication Management. PPHx is significant for Bipolar Disorder, GAD, ADHD, and 1 Suicide Attempt (cut self with knife- 06/2020) and 1 Psychiatric Hospitalization [facility]- 06/2020).   Wanda Torres has had an acute worsening of her anxiety and depression due to recent court rulings with her ex and him moving to [place] with her 2 youngest children.  We will increase the frequency of her gabapentin from 3 times a day to 4 times a day as this has helped control her anxiety.  She has been having vivid nightmares so we will trial prazosin at this time.  We will not make any other changes to her medication at this  time.  She will return for follow-up approximately 6 to 8 weeks.   Bipolar disorder, current episode mixed, moderate  GAD: -Continue Latuda 80 mg daily for mood stability.  No refills sent at this time.  -Continue Cymbalta 30 mg daily for depression and anxiety.  No refills sent at this time.  -Increase frequency of Gabapentin to 100 mg QID for anxiety.  120 capsules with 1 refill.     ADHD: -Continue Adderall XR 30 mg daily.  30 tablets with 1 refill.     Insomnia: -Continue Trazodone 50 mg QHS PRN.  No refills sent at this time. -Start Prazosin 1 mg QHS for nightmares.  30 tablets with 1 refill.    Collaboration  of Care:   Patient/Guardian was advised Release of Information must be obtained prior to any record release in order to collaborate their care with an outside provider. Patient/Guardian was advised if they have not already done so to contact the registration department to sign all necessary forms in order for [place] to release information regarding their care.   Consent: Patient/Guardian gives verbal consent for treatment and assignment of benefits for services provided during this visit. Patient/Guardian expressed understanding and agreed to proceed.    Lauro Franklin, MD 05/30/2023, 4:18 PM

## 2023-06-08 ENCOUNTER — Ambulatory Visit: Payer: Medicaid Other | Admitting: Nurse Practitioner

## 2023-06-29 ENCOUNTER — Telehealth (HOSPITAL_COMMUNITY): Payer: Self-pay | Admitting: Student in an Organized Health Care Education/Training Program

## 2023-06-29 DIAGNOSIS — F900 Attention-deficit hyperactivity disorder, predominantly inattentive type: Secondary | ICD-10-CM

## 2023-06-29 MED ORDER — AMPHETAMINE-DEXTROAMPHET ER 30 MG PO CP24
30.0000 mg | ORAL_CAPSULE | Freq: Every day | ORAL | 0 refills | Status: DC
Start: 2023-07-01 — End: 2023-07-28

## 2023-06-29 NOTE — Telephone Encounter (Signed)
Received message that Adderall instant release had been sent to the pharmacy but that patient takes Extended Release Formulation.  Called patient's pharmacy [facility] [address] [place], [phone].  Spoke with pharmacist to cancel Instant Release prescription.  Sent corrected prescription of Extended Release Adderall to same pharmacy.    Sent: -Adderall XR 30 mg daily.  30 tablets with 0 refills.   Arna Snipe MD Resident

## 2023-07-18 ENCOUNTER — Other Ambulatory Visit: Payer: Self-pay | Admitting: Nurse Practitioner

## 2023-07-18 DIAGNOSIS — E282 Polycystic ovarian syndrome: Secondary | ICD-10-CM

## 2023-07-28 ENCOUNTER — Encounter: Payer: Self-pay | Admitting: Nurse Practitioner

## 2023-07-28 ENCOUNTER — Ambulatory Visit (INDEPENDENT_AMBULATORY_CARE_PROVIDER_SITE_OTHER): Payer: MEDICAID | Admitting: Student

## 2023-07-28 ENCOUNTER — Ambulatory Visit (INDEPENDENT_AMBULATORY_CARE_PROVIDER_SITE_OTHER): Payer: MEDICAID | Admitting: Nurse Practitioner

## 2023-07-28 VITALS — BP 120/84 | HR 81 | Temp 98.1°F | Ht 72.0 in | Wt 307.0 lb

## 2023-07-28 DIAGNOSIS — E781 Pure hyperglyceridemia: Secondary | ICD-10-CM

## 2023-07-28 DIAGNOSIS — F900 Attention-deficit hyperactivity disorder, predominantly inattentive type: Secondary | ICD-10-CM | POA: Diagnosis not present

## 2023-07-28 DIAGNOSIS — R000 Tachycardia, unspecified: Secondary | ICD-10-CM | POA: Diagnosis not present

## 2023-07-28 DIAGNOSIS — F411 Generalized anxiety disorder: Secondary | ICD-10-CM

## 2023-07-28 DIAGNOSIS — E282 Polycystic ovarian syndrome: Secondary | ICD-10-CM

## 2023-07-28 DIAGNOSIS — Z79899 Other long term (current) drug therapy: Secondary | ICD-10-CM | POA: Diagnosis not present

## 2023-07-28 DIAGNOSIS — Z6841 Body Mass Index (BMI) 40.0 and over, adult: Secondary | ICD-10-CM | POA: Diagnosis not present

## 2023-07-28 DIAGNOSIS — F3162 Bipolar disorder, current episode mixed, moderate: Secondary | ICD-10-CM | POA: Diagnosis not present

## 2023-07-28 MED ORDER — METFORMIN HCL 500 MG PO TABS
500.0000 mg | ORAL_TABLET | Freq: Every day | ORAL | 0 refills | Status: DC
Start: 2023-07-28 — End: 2024-04-04

## 2023-07-28 MED ORDER — GABAPENTIN 100 MG PO CAPS
100.0000 mg | ORAL_CAPSULE | Freq: Four times a day (QID) | ORAL | 1 refills | Status: DC
Start: 2023-07-28 — End: 2023-09-02

## 2023-07-28 MED ORDER — AMPHETAMINE-DEXTROAMPHET ER 30 MG PO CP24
30.0000 mg | ORAL_CAPSULE | Freq: Every day | ORAL | 0 refills | Status: DC
Start: 2023-07-28 — End: 2023-09-16

## 2023-07-28 MED ORDER — DULOXETINE HCL 30 MG PO CPEP
30.0000 mg | ORAL_CAPSULE | Freq: Every day | ORAL | 0 refills | Status: DC
Start: 2023-07-28 — End: 2023-09-02

## 2023-07-28 MED ORDER — AMPHETAMINE-DEXTROAMPHET ER 30 MG PO CP24
30.0000 mg | ORAL_CAPSULE | Freq: Every day | ORAL | 0 refills | Status: DC
Start: 2023-08-28 — End: 2023-09-02

## 2023-07-28 MED ORDER — LURASIDONE HCL 120 MG PO TABS
120.0000 mg | ORAL_TABLET | Freq: Every evening | ORAL | 0 refills | Status: DC
Start: 2023-07-28 — End: 2023-09-02

## 2023-07-28 MED ORDER — PROPRANOLOL HCL 10 MG PO TABS
10.0000 mg | ORAL_TABLET | Freq: Two times a day (BID) | ORAL | 0 refills | Status: DC
Start: 2023-07-28 — End: 2023-09-02

## 2023-07-28 NOTE — Patient Instructions (Signed)

## 2023-07-28 NOTE — Progress Notes (Signed)
BH MD Outpatient Progress Note  07/28/2023 5:32 PM Wanda Torres  MRN:  [id number]  Assessment:  Wanda Torres presents for follow-up evaluation in-person. Today, 07/28/23, patient reports overall improvement since a few months ago with she saw Dr. Renaldo Fiddler.  She has had problems with anhedonia recently as her anxiety has subsided.  We discussed increasing the lurasidone to 120 mg and she was amenable to this. Patient to follow up in 1 month with plan for her to find her ADHD paperwork. She will need repeat neuropsych testing and UDS to be done to continue on her adderall medication.   Identifying Information: Wanda Torres is a 32 y.o. female with a history of Bipolar Disorder, GAD, ADHD who is an established patient with [facility] for medication management.    Plan:  # Bipolar Disorder, currently depressed Past medication trials:  Status of problem: improving Interventions: -- INCREASE lurasidone to 120 mg daily -- Continue duloxetine 30 mg daily  # ADHD Past medication trials:  Status of problem: stable Interventions: -- continue adderall XR 30 mg daily  -Needs to bring in ADHD testing results and obtain UDS  # Generalized Anxiety Disorder Past medication trials:  Status of problem: stable Interventions: -- continue gabapentin 100 mg 4 times daily --Continue propranolol 10 mg twice daily --continue trazodone 50 mg at bedtime prn for insomnia  Return to care in 1 month  Patient was given contact information for behavioral health clinic and was instructed to call 911 for emergencies.    Patient and plan of care will be discussed with the Attending MD ,Dr. Adrian Blackwater, who agrees with the above statement and plan.   Subjective:  Chief Complaint:  Chief Complaint  Patient presents with   Medication Management    Interval History:  Patient reports to self discontinuing prazosin after taking it once because she "felt drunk".  She denies  any significant complaints with her other medications.  She reports that her anxiety is better controlled with the gabapentin at 100 mg 4 times daily.  She does report some concerns that since her anxiety has improved, she has noticed significant anhedonia.  She reports difficulty getting out of bed and being motivated to do anything during the daytime.  She currently is unemployed and is filing for disability.  She states her sleep has been fair and appetite has been appropriate.  She denies SI/HI/AVH.  She was agreeable to increasing the trazodone in hopes that it will aid with bipolar depression.  All questions were addressed.  I asked her to bring her neuropsychological testing for ADHD.  She states that she will look for the but it has been some time since she last had them done.   Visit Diagnosis:    ICD-10-CM   1. Bipolar disorder, current episode mixed, moderate (HCC)  F31.62 Lurasidone HCl (LATUDA) 120 MG TABS    Ambulatory referral to TMS    DULoxetine (CYMBALTA) 30 MG capsule    2. Attention deficit hyperactivity disorder (ADHD), predominantly inattentive type  F90.0 amphetamine-dextroamphetamine (ADDERALL XR) 30 MG 24 hr capsule    amphetamine-dextroamphetamine (ADDERALL XR) 30 MG 24 hr capsule    3. Generalized anxiety disorder  F41.1 DULoxetine (CYMBALTA) 30 MG capsule    gabapentin (NEURONTIN) 100 MG capsule    4. Tachycardia  R00.0 propranolol (INDERAL) 10 MG tablet      Past Psychiatric History:  Diagnoses: ADHD, Bipolar Disorder Previous psychiatrist/therapist: Dr. Renaldo Fiddler Hospitalizations: once for mania Suicide attempts: denies SIB:  denies Hx of violence towards others: denies Current access to guns: denies Hx of trauma/abuse: denies Substance use: denies  Past Medical History:  Past Medical History:  Diagnosis Date   Acne    ADD (attention deficit disorder)    Alcohol abuse    Allergy    Anemia    Anxiety    ASCUS with positive high risk human  papillomavirus of vagina 05/2022   Bipolar 1 disorder (HCC)    Depression    Depression    Drug use    Hypothyroidism    Joint pain    PCOS (polycystic ovarian syndrome)    Psoriasis    Supervision of other high risk pregnancy, antepartum 01/19/2019   Formatting of this note might be different from the original. Partner prefers She/Her pronouns   Swelling of lower extremity    Vitamin B 12 deficiency    Vitamin D deficiency     Past Surgical History:  Procedure Laterality Date   COLPOSCOPY  07/2022   ENDOCERVICAL MUCOSA WITH CHRONIC CERVICITIS.   NO SQUAMOUS MUCOSA IDENTIFIED.   NO EVIDENCE OF DYSPLASIA OR MALIGNANCY.     Family History:  Family History  Problem Relation Age of Onset   Anxiety disorder Mother    Depression Mother    Sexual abuse Mother    Hypothyroidism Mother    High blood pressure Mother    High Cholesterol Mother    Drug abuse Father    ADD / ADHD Sister    Anxiety disorder Sister    Bipolar disorder Sister    Depression Sister    Drug abuse Sister    Sexual abuse Sister    Anxiety disorder Brother    Depression Brother    Sexual abuse Brother    Thyroid disease Maternal Grandmother        THyroid cancer   Depression Maternal Grandmother    Sexual abuse Maternal Grandmother    Alcohol abuse Maternal Grandfather    Dementia Maternal Grandfather    Depression Maternal Grandfather    Sexual abuse Maternal Grandfather    Alcohol abuse Maternal Aunt    Bipolar disorder Maternal Aunt    Depression Maternal Aunt    Sexual abuse Maternal Aunt    Bipolar disorder Maternal Aunt    Depression Maternal Aunt    Sexual abuse Maternal Aunt    ADD / ADHD Maternal Uncle    Alcohol abuse Maternal Uncle    Drug abuse Maternal Uncle    Sexual abuse Maternal Uncle     Social History:  Academic/Vocational: unemployed Social History   Socioeconomic History   Marital status: Divorced    Spouse name: Not on file   Number of children: 4   Years of  education: Not on file   Highest education level: Some college, no degree  Occupational History    Comment: Supervisor reservations; 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[employer]  Tobacco Use   Smoking status: Never   Smokeless tobacco: Never   Tobacco comments:    Vaps daily   Vaping Use   Vaping status: Every Day   Substances: Nicotine  Substance and Sexual Activity   Alcohol use: Yes    Alcohol/week: 1.0 standard drink of alcohol    Types: 1 Standard drinks or equivalent per week    Comment: reports one a week   Drug use: Never   Sexual activity: Not on file  Other Topics Concern   Not on file  Social History Narrative   Not on file  Social Determinants of Health   Financial Resource Strain: Low Risk  (03/23/2023)   Overall Financial Resource Strain (CARDIA)    Difficulty of Paying Living Expenses: Not hard at all  Food Insecurity: Food Insecurity Present (03/23/2023)   Hunger Vital Sign    Worried About Running Out of Food in the Last Year: Sometimes true    Ran Out of Food in the Last Year: Never true  Transportation Needs: No Transportation Needs (03/23/2023)   PRAPARE - Administrator, Civil Service (Medical): No    Lack of Transportation (Non-Medical): No  Physical Activity: Insufficiently Active (03/23/2023)   Exercise Vital Sign    Days of Exercise per Week: 3 days    Minutes of Exercise per Session: 30 min  Stress: Stress Concern Present (03/23/2023)   [employer] of Occupational Health - Occupational Stress Questionnaire    Feeling of Stress : To some extent  Social Connections: Moderately Isolated (03/23/2023)   Social Connection and Isolation Panel [NHANES]    Frequency of Communication with Friends and Family: More than three times a week    Frequency of Social Gatherings with Friends and Family: Once a week    Attends Religious Services: Never    Database administrator or Organizations: No    Attends Engineer, structural: Not on file    Marital  Status: Living with partner    Allergies: No Known Allergies  Current Medications: Current Outpatient Medications  Medication Sig Dispense Refill   amphetamine-dextroamphetamine (ADDERALL XR) 30 MG 24 hr capsule Take 1 capsule (30 mg total) by mouth daily. 30 capsule 0   [START ON 08/28/2023] amphetamine-dextroamphetamine (ADDERALL XR) 30 MG 24 hr capsule Take 1 capsule (30 mg total) by mouth daily. 30 capsule 0   Cetirizine HCl (ZYRTEC ALLERGY PO) Take by mouth.     Cholecalciferol (VITAMIN D-3 PO) Take by mouth.     Cyanocobalamin (VITAMIN B-12 PO) Take by mouth.     DULoxetine (CYMBALTA) 30 MG capsule Take 1 capsule (30 mg total) by mouth daily. For depression 90 capsule 0   fenofibrate (TRICOR) 145 MG tablet Take 1 tablet (145 mg total) by mouth daily. 90 tablet 1   gabapentin (NEURONTIN) 100 MG capsule Take 1 capsule (100 mg total) by mouth 4 (four) times daily. 120 capsule 1   Lurasidone HCl (LATUDA) 120 MG TABS Take 1 tablet (120 mg total) by mouth every evening. 30 tablet 0   metFORMIN (GLUCOPHAGE) 500 MG tablet Take 1 tablet (500 mg total) by mouth daily with breakfast. TAKE 1 TABLET(500 MG) BY MOUTH DAILY WITH BREAKFAST 90 tablet 0   propranolol (INDERAL) 10 MG tablet Take 1 tablet (10 mg total) by mouth 2 (two) times daily. 180 tablet 0   traZODone (DESYREL) 50 MG tablet Take 1 tablet (50 mg total) by mouth at bedtime as needed. for sleep 90 tablet 1   No current facility-administered medications for this visit.    ROS: Review of Systems   Objective:  Psychiatric Specialty Exam: There were no vitals taken for this visit.There is no height or weight on file to calculate BMI.  General Appearance: Fairly Groomed  Eye Contact:  Good  Speech:  Clear and Coherent and Normal Rate  Volume:  Normal  Mood:  Euthymic  Affect:  Appropriate and Congruent  Thought Content: Logical   Suicidal Thoughts:  No  Homicidal Thoughts:  No  Thought Process:  Coherent, Goal Directed, and  Linear  Orientation:  Full (  Time, Place, and Person)    Memory: Remote;   Good  Judgment:  Fair  Insight:  Good  Concentration:  Concentration: Good  Recall: not formally assessed   Fund of Knowledge: Fair  Language: Fair  Psychomotor Activity:  Normal  Akathisia:  Negative  AIMS (if indicated): not done  Assets:  Communication Skills Desire for Improvement Financial Resources/Insurance Housing Leisure Time Physical Health Resilience Social Support Talents/Skills Transportation  ADL's:  Intact  Cognition: WNL  Sleep:  Good   PE: General: well-appearing; no acute distress  Pulm: no increased work of breathing on room air  Strength & Muscle Tone: within normal limits Neuro: no focal neurological deficits observed  Gait & Station: normal  Metabolic Disorder Labs: Lab Results  Component Value Date   HGBA1C 4.8 03/10/2023   MPG 79.58 07/09/2020   No results found for: "PROLACTIN" Lab Results  Component Value Date   CHOL 112 04/06/2023   TRIG 677 (HH) 04/06/2023   HDL 61 04/06/2023   CHOLHDL 2.1 03/10/2023   VLDL 29 07/09/2020   LDLCALC Comment (A) 04/06/2023   LDLCALC 9 03/10/2023   Lab Results  Component Value Date   TSH 3.450 03/10/2023   TSH 2.264 07/09/2020    Therapeutic Level Labs: No results found for: "LITHIUM" No results found for: "VALPROATE" No results found for: "CBMZ"  Screenings: AIMS    Flowsheet Row [facility] (Discharged[facility]  AIMS Total Score 0      AUDIT    Flowsheet Row Appointment from 03/24/2023 in [facility] [place] Admission (Discharged) [facility] Admission (Discharged) from OP [facility]  Alcohol Use Disorder Identification Test Final Score (AUDIT) 5 2 3       GAD-7    Flowsheet Row Office Visit from 03/10/2023 in  [facility] Video Visit from 09/14/2022 in [facility] Video Visit from 08/04/2022 in [facility] Video Visit from 06/16/2022 in [facility] Video Visit from 04/14/2022 in [facility]  Total GAD-7 Score 6 5 4 1 2       PHQ2-9    Flowsheet Row Office Visit from 03/10/2023 in [facility] Video Visit from 09/14/2022 in [facility] Video Visit from 08/04/2022 in [facility] Video Visit from 06/16/2022 in [facility] Video Visit from 04/14/2022 in [facility]  PHQ-2 Total Score 1 1 1 1 3   PHQ-9 Total Score 7 -- -- -- 9      Flowsheet Row Video Visit from 09/14/2022 in [facility] Video Visit from 08/04/2022 in [facility] Video Visit from 06/16/2022 in [facility]  C-SSRS RISK CATEGORY Moderate Risk Low Risk Low Risk       Collaboration of Care: Collaboration of Care:   Patient/Guardian was advised Release of Information must be obtained prior to any record release in order to collaborate their care with an outside provider. Patient/Guardian was advised if they have not already done so to contact the registration department to sign all necessary forms in order for [place] to release information regarding their care.   Consent: Patient/Guardian gives verbal consent for treatment and assignment of benefits for  services provided during this visit. Patient/Guardian expressed understanding and agreed to proceed.   Park Pope, MD 07/28/2023, 5:32 PM

## 2023-07-28 NOTE — Progress Notes (Signed)
Office: [phone]  /  Fax: [phone]  WEIGHT SUMMARY AND BIOMETRICS  Weight Lost Since Last Visit: 0lb  Weight Gained Since Last Visit: 14lb   Vitals Temp: 98.1 F (36.7 C) BP: 120/84 Pulse Rate: 81 SpO2: 98 %   Anthropometric Measurements Height: 6' (1.829 m) Weight: (!) 307 lb (139.3 kg) BMI (Calculated): 41.63 Weight at Last Visit: 293lb Weight Lost Since Last Visit: 0lb Weight Gained Since Last Visit: 14lb Starting Weight: 288lb Total Weight Loss (lbs): 0 lb (0 kg)   Body Composition  Body Fat %: 49.3 % Fat Mass (lbs): 151.6 lbs Muscle Mass (lbs): 148.2 lbs Total Body Water (lbs): 104.8 lbs Visceral Fat Rating : 13   Other Clinical Data Fasting: Yes Labs: No Today's Visit #: 4 Starting Date: 04/06/23     HPI  Chief Complaint: OBESITY  Wanda Torres is here to discuss her progress with her obesity treatment plan. She is on the the Category 4 Plan and states she is following her eating plan approximately 60 % of the time. She states she is exercising 30 minutes 3 days per week.   Interval History:  She was seen here last on 05/10/23 and has gained 14 pounds. She has been under some stress since her last visit.  Notes she has gotten off track.  She feels that she has recently gotten back on track this week. She is drinking a protein shake for BF, lunch sandwich and dinner a protein, vegetable and another protein shake.  She has restarted following her plan and is working out with a Systems analyst.  She is drinking water, OJ and vodka.    Pharmacotherapy for weight loss: She is not currently taking medications  for medical weight loss.    Previous pharmacotherapy for medical weight loss:  none  Bariatric surgery:  Patient has not had bariatric surgery  PCOS Taking Metformin 500mg  daily (has been out for a week and is asking for a refill).  Denies side effects  Hypertriglyceridemia Taking tricor 145mg .  Denies side effects.  Reports was drinking  vodka prior to last labs and currently. Knows she needs to stop and is aware of complications associated with high triglycerides.    PHYSICAL EXAM:  Blood pressure 120/84, pulse 81, temperature 98.1 F (36.7 C), height 6' (1.829 m), weight (!) 307 lb (139.3 kg), SpO2 98%. Body mass index is 41.64 kg/m.  General: She is overweight, cooperative, alert, well developed, and in no acute distress. PSYCH: Has normal mood, affect and thought process.   Extremities: No edema.  Neurologic: No gross sensory or motor deficits. No tremors or fasciculations noted.    DIAGNOSTIC DATA REVIEWED:  BMET    Component Value Date/Time   NA 139 03/10/2023 1438   K 4.2 03/10/2023 1438   CL 101 03/10/2023 1438   CO2 22 03/10/2023 1438   GLUCOSE 88 03/10/2023 1438   GLUCOSE 63 (L) 09/15/2020 2229   BUN 15 03/10/2023 1438   CREATININE 0.80 03/10/2023 1438   CALCIUM 9.5 03/10/2023 1438   GFRNONAA >60 09/15/2020 2229   GFRAA >60 09/15/2020 2229   Lab Results  Component Value Date   HGBA1C 4.8 03/10/2023   HGBA1C 4.4 (L) 07/09/2020   Lab Results  Component Value Date   INSULIN 6.0 04/06/2023   Lab Results  Component Value Date   TSH 3.450 03/10/2023   CBC    Component Value Date/Time   WBC 5.3 03/10/2023 1438   WBC 7.2 09/15/2020 2229   RBC 4.49  03/10/2023 1438   RBC 4.78 09/15/2020 2229   HGB 15.0 03/10/2023 1438   HCT 43.9 03/10/2023 1438   PLT 254 03/10/2023 1438   MCV 98 (H) 03/10/2023 1438   MCH 33.4 (H) 03/10/2023 1438   MCH 33.7 09/15/2020 2229   MCHC 34.2 03/10/2023 1438   MCHC 35.8 09/15/2020 2229   RDW 13.8 03/10/2023 1438   Iron Studies    Component Value Date/Time   IRON 79 05/01/2018 1740   Lipid Panel     Component Value Date/Time   CHOL 112 04/06/2023 0958   TRIG 677 (HH) 04/06/2023 0958   HDL 61 04/06/2023 0958   CHOLHDL 2.1 03/10/2023 1438   CHOLHDL 2.2 07/09/2020 1745   VLDL 29 07/09/2020 1745   LDLCALC Comment (A) 04/06/2023 0958   Hepatic Function  Panel     Component Value Date/Time   PROT 6.9 03/10/2023 1438   ALBUMIN 4.5 03/10/2023 1438   AST 26 03/10/2023 1438   ALT 29 03/10/2023 1438   ALKPHOS 68 03/10/2023 1438   BILITOT 0.5 03/10/2023 1438   BILIDIR 0.1 07/09/2020 1745   IBILI 0.6 07/09/2020 1745      Component Value Date/Time   TSH 3.450 03/10/2023 1438   Nutritional Lab Results  Component Value Date   VD25OH 41.3 03/10/2023   VD25OH 37.1 05/01/2018     ASSESSMENT AND PLAN  TREATMENT PLAN FOR OBESITY:  Recommended Dietary Goals  Wanda Torres is currently in the action stage of change. As such, her goal is to continue weight management plan. She has agreed to the Category 4 Plan.  Behavioral Intervention  We discussed the following Behavioral Modification Strategies today: increasing lean protein intake, decreasing simple carbohydrates , increasing vegetables, increasing lower glycemic fruits, increasing water intake, work on meal planning and preparation, continue to practice mindfulness when eating, and planning for success.  Additional resources provided today: NA  Recommended Physical Activity Goals  Wanda Torres has been advised to work up to 150 minutes of moderate intensity aerobic activity a week and strengthening exercises 2-3 times per week for cardiovascular health, weight loss maintenance and preservation of muscle mass.   She has agreed to Continue current level of physical activity    ASSOCIATED CONDITIONS ADDRESSED TODAY  Action/Plan  Hypertriglyceridemia Seeing PCP back for follow up in September.  Discussed the importance of stopping/limiting alcohol intake.  Patient verbalizes understanding.    PCOS (polycystic ovarian syndrome) -    Restart metFORMIN HCl; Take 1 tablet (500 mg total) by mouth daily with breakfast. TAKE 1 TABLET(500 MG) BY MOUTH DAILY WITH BREAKFAST  Dispense: 90 tablet; Refill: 0  Intensive lifestyle modifications are first line treatment for this issue. We discussed  several lifestyle modifications today and she will continue to work on diet, exercise and weight loss efforts. Orders and follow up as documented in patient record.  Counseling PCOS is a leading cause of menstrual irregularities and infertility. It is also associated with obesity, hirsutism (excessive hair growth on the face, chest, or back), and cardiovascular risk factors such as high cholesterol and insulin resistance. Insulin resistance appears to play a central role.  Women with PCOS have been shown to have impaired appetite-regulating hormones. Metformin is one medication that can improve metabolic parameters.  Women with polycystic ovary syndrome (PCOS) have an increased risk for cardiovascular disease (CVD) - European Journal of Preventive Cardiology.   Morbid obesity (HCC)  BMI 40.0-44.9, adult (HCC)      Will obtain labs in 1-2 months if not  obtained at PCP's office.    Return in about 3 weeks (around 08/18/2023).Marland Kitchen She was informed of the importance of frequent follow up visits to maximize her success with intensive lifestyle modifications for her multiple health conditions.   ATTESTASTION STATEMENTS:  Reviewed by clinician on day of visit: allergies, medications, problem list, medical history, surgical history, family history, social history, and previous encounter notes.     Theodis Sato. Kaz Auld FNP-C

## 2023-07-29 DIAGNOSIS — Z79899 Other long term (current) drug therapy: Secondary | ICD-10-CM | POA: Insufficient documentation

## 2023-08-24 ENCOUNTER — Ambulatory Visit: Payer: MEDICAID | Admitting: Nurse Practitioner

## 2023-08-31 NOTE — Progress Notes (Addendum)
BH MD Outpatient Progress Note  09/01/2023 6:45 PM NICOLL TONCHE  MRN:  [id number]  Televisit via video: I connected with Wanda Torres on 09/01/23 at  1:30 PM EDT by a video enabled telemedicine application and verified that I am speaking with the correct person using two identifiers.  Location: Patient: home Provider: office   I discussed the limitations of evaluation and management by telemedicine and the availability of in person appointments. The patient expressed understanding and agreed to proceed.  I discussed the assessment and treatment plan with the patient. The patient was provided an opportunity to ask questions and all were answered. The patient agreed with the plan and demonstrated an understanding of the instructions.   The patient was advised to call back or seek an in-person evaluation if the symptoms worsen or if the condition fails to improve as anticipated.  Assessment:  Wanda Torres presents for follow-up evaluation virtually. Today, 11/29/23, patient reports continued symptoms of depression and anhedonia even with higher dose of lurasidone. Given duloxetine at higher doses have led to significant side effects such as heat intolerance, decision was made to switch to fluoxetine to manage depressive symptoms. Patient is exploring ketamine and TMS options with [facility]. Plan to continue other psychotropics as prescribed.   Identifying Information: Wanda Torres is a 32 y.o. female with a history of Bipolar Disorder, GAD, ADHD who is an established patient with [facility] for medication management.    Plan:  # Bipolar Disorder, currently depressed Past medication trials: zoloft (ineffective), bupropion (irritability) Status of problem: improving Interventions: -- Continue lurasidone 120 mg daily -- STOP duloxetine -- START fluoxetine 20 mg daily  # ADHD Past medication trials:  Status of problem:  stable Interventions: -- continue adderall XR 30 mg daily  -Needs to have ADHD testing results faxed from Dr. Eppie Gibson office  # Generalized Anxiety Disorder Past medication trials:  Status of problem: stable Interventions: -- continue gabapentin 100 mg 4 times daily --Continue propranolol 10 mg twice daily --continue trazodone 50 mg at bedtime prn for insomnia  Return to care in 1 month  Patient was given contact information for behavioral health clinic and was instructed to call 911 for emergencies.    Patient and plan of care will be discussed with the Attending MD, Dr. Josephina Shih, who agrees with the above statement and plan.   Subjective:  Chief Complaint:  Chief Complaint  Patient presents with   Medication Management    Interval History:  Patient reports continuing to experience anhedonia and depressed mood.  He denies SI/HI/AVH.  She reports that the increase in lurasidone did not have significant effect on her mood.  She also reports that she has significant heat intolerance when she is on Cymbalta and would like to be switched to a different antidepressant.  She reports she will still get up several times in the middle of the night and have vivid dreams but these have been chronic problems.  She denies that these vivid dreams or nightmares.  She reports appetite has been appropriate and unchanged.   Visit Diagnosis:    ICD-10-CM   1. Generalized anxiety disorder  F41.1 DISCONTINUED: gabapentin (NEURONTIN) 100 MG capsule    2. Bipolar disorder, current episode mixed, moderate (HCC)  F31.62 DISCONTINUED: Lurasidone HCl (LATUDA) 120 MG TABS    DISCONTINUED: FLUoxetine (PROZAC) 20 MG capsule    3. Tachycardia  R00.0 DISCONTINUED: propranolol (INDERAL) 10 MG tablet    4. Insomnia, unspecified type  G47.00 Ambulatory referral to Sleep Studies    5. Attention deficit hyperactivity disorder (ADHD), predominantly inattentive type  F90.0 DISCONTINUED:  amphetamine-dextroamphetamine (ADDERALL XR) 30 MG 24 hr capsule       Past Psychiatric History:  Diagnoses: ADHD, Bipolar Disorder Previous psychiatrist/therapist: Dr. Renaldo Fiddler Hospitalizations: once for mania Suicide attempts: denies SIB: denies Hx of violence towards others: denies Current access to guns: denies Hx of trauma/abuse: denies Substance use: denies  Past Medical History:  Past Medical History:  Diagnosis Date   Acne    ADD (attention deficit disorder)    Alcohol abuse    Allergy    Anemia    Anxiety    ASCUS with positive high risk human papillomavirus of vagina 05/2022   Bipolar 1 disorder (HCC)    Depression    Depression    Drug use    Hypothyroidism    Joint pain    PCOS (polycystic ovarian syndrome)    Psoriasis    Supervision of other high risk pregnancy, antepartum 01/19/2019   Formatting of this note might be different from the original. Partner prefers She/Her pronouns   Swelling of lower extremity    Vitamin B 12 deficiency    Vitamin D deficiency     Past Surgical History:  Procedure Laterality Date   COLPOSCOPY  07/2022   ENDOCERVICAL MUCOSA WITH CHRONIC CERVICITIS.   NO SQUAMOUS MUCOSA IDENTIFIED.   NO EVIDENCE OF DYSPLASIA OR MALIGNANCY.     Family History:  Family History  Problem Relation Age of Onset   Anxiety disorder Mother    Depression Mother    Sexual abuse Mother    Hypothyroidism Mother    High blood pressure Mother    High Cholesterol Mother    Drug abuse Father    ADD / ADHD Sister    Anxiety disorder Sister    Bipolar disorder Sister    Depression Sister    Drug abuse Sister    Sexual abuse Sister    Anxiety disorder Brother    Depression Brother    Sexual abuse Brother    Thyroid disease Maternal Grandmother        THyroid cancer   Depression Maternal Grandmother    Sexual abuse Maternal Grandmother    Alcohol abuse Maternal Grandfather    Dementia Maternal Grandfather    Depression Maternal Grandfather     Sexual abuse Maternal Grandfather    Alcohol abuse Maternal Aunt    Bipolar disorder Maternal Aunt    Depression Maternal Aunt    Sexual abuse Maternal Aunt    Bipolar disorder Maternal Aunt    Depression Maternal Aunt    Sexual abuse Maternal Aunt    ADD / ADHD Maternal Uncle    Alcohol abuse Maternal Uncle    Drug abuse Maternal Uncle    Sexual abuse Maternal Uncle     Social History:  Academic/Vocational: unemployed Social History   Socioeconomic History   Marital status: Divorced    Spouse name: Not on file   Number of children: 4   Years of education: Not on file   Highest education level: Some college, no degree  Occupational History    Comment: Supervisor reservations; 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[employer]  Tobacco Use   Smoking status: Never   Smokeless tobacco: Never   Tobacco comments:    Vaps daily   Vaping Use   Vaping status: Every Day   Substances: Nicotine  Substance and Sexual Activity   Alcohol use: Yes  Alcohol/week: 1.0 standard drink of alcohol    Types: 1 Standard drinks or equivalent per week    Comment: reports one a week   Drug use: Never   Sexual activity: Not on file  Other Topics Concern   Not on file  Social History Narrative   Not on file   Social Determinants of Health   Financial Resource Strain: Low Risk  (03/23/2023)   Overall Financial Resource Strain (CARDIA)    Difficulty of Paying Living Expenses: Not hard at all  Food Insecurity: Food Insecurity Present (03/23/2023)   Hunger Vital Sign    Worried About Running Out of Food in the Last Year: Sometimes true    Ran Out of Food in the Last Year: Never true  Transportation Needs: No Transportation Needs (03/23/2023)   PRAPARE - Administrator, Civil Service (Medical): No    Lack of Transportation (Non-Medical): No  Physical Activity: Insufficiently Active (03/23/2023)   Exercise Vital Sign    Days of Exercise per Week: 3 days    Minutes of Exercise per Session: 30 min   Stress: Stress Concern Present (03/23/2023)   [employer] of Occupational Health - Occupational Stress Questionnaire    Feeling of Stress : To some extent  Social Connections: Moderately Isolated (03/23/2023)   Social Connection and Isolation Panel [NHANES]    Frequency of Communication with Friends and Family: More than three times a week    Frequency of Social Gatherings with Friends and Family: Once a week    Attends Religious Services: Never    Database administrator or Organizations: No    Attends Engineer, structural: Not on file    Marital Status: Living with partner    Allergies: No Known Allergies  Current Medications: Current Outpatient Medications  Medication Sig Dispense Refill   Amphet-Dextroamphet 3-Bead ER 25 MG CP24 Take 1 capsule (25 mg total) by mouth daily. 30 capsule 0   amphetamine-dextroamphetamine (ADDERALL XR) 20 MG 24 hr capsule Take 1 capsule (20 mg total) by mouth daily. 30 capsule 0   buPROPion ER (WELLBUTRIN SR) 100 MG 12 hr tablet Take 1 tablet (100 mg total) by mouth 2 (two) times daily. 60 tablet 0   Cetirizine HCl (ZYRTEC ALLERGY PO) Take by mouth.     Cholecalciferol (VITAMIN D-3 PO) Take by mouth.     Cyanocobalamin (VITAMIN B-12 PO) Take by mouth.     fenofibrate (TRICOR) 145 MG tablet Take 1 tablet (145 mg total) by mouth daily. 90 tablet 1   FLUoxetine (PROZAC) 40 MG capsule Take 1 capsule (40 mg total) by mouth daily. 30 capsule 1   gabapentin (NEURONTIN) 100 MG capsule Take 1 capsule (100 mg total) by mouth 3 (three) times daily. 90 capsule 0   Lurasidone HCl (LATUDA) 120 MG TABS Take 1 tablet (120 mg total) by mouth every evening. Take with food 30 tablet 1   metFORMIN (GLUCOPHAGE) 500 MG tablet Take 1 tablet (500 mg total) by mouth daily with breakfast. TAKE 1 TABLET(500 MG) BY MOUTH DAILY WITH BREAKFAST 90 tablet 0   propranolol (INDERAL) 10 MG tablet Take 1 tablet (10 mg total) by mouth daily. 30 tablet 0   No current  facility-administered medications for this visit.    ROS: Review of Systems   Objective:  Psychiatric Specialty Exam: There were no vitals taken for this visit.There is no height or weight on file to calculate BMI.  General Appearance: Fairly Groomed  Eye Contact:  Good  Speech:  Clear and Coherent and Normal Rate  Volume:  Normal  Mood:  Euthymic  Affect:  Appropriate and Congruent  Thought Content: Logical   Suicidal Thoughts:  No  Homicidal Thoughts:  No  Thought Process:  Coherent, Goal Directed, and Linear  Orientation:  Full (Time, Place, and Person)    Memory: Remote;   Good  Judgment:  Fair  Insight:  Good  Concentration:  Concentration: Good  Recall: not formally assessed   Fund of Knowledge: Fair  Language: Fair  Psychomotor Activity:  Normal  Akathisia:  Negative  AIMS (if indicated): not done  Assets:  Communication Skills Desire for Improvement Financial Resources/Insurance Housing Leisure Time Physical Health Resilience Social Support Talents/Skills Transportation  ADL's:  Intact  Cognition: WNL  Sleep:  Good   PE: General: well-appearing; no acute distress  Pulm: no increased work of breathing on room air  Strength & Muscle Tone: within normal limits Neuro: no focal neurological deficits observed  Gait & Station: normal  Metabolic Disorder Labs: Lab Results  Component Value Date   HGBA1C 4.8 03/10/2023   MPG 79.58 07/09/2020   No results found for: "PROLACTIN" Lab Results  Component Value Date   CHOL 166 09/19/2023   TRIG 97 09/19/2023   HDL 59 09/19/2023   CHOLHDL 2.8 09/19/2023   VLDL 29 07/09/2020   LDLCALC 89 09/19/2023   LDLCALC Comment (A) 04/06/2023   Lab Results  Component Value Date   TSH 3.450 03/10/2023   TSH 2.264 07/09/2020    Therapeutic Level Labs: No results found for: "LITHIUM" No results found for: "VALPROATE" No results found for: "CBMZ"  Screenings: AIMS    Flowsheet Row Admission (Discharged) [facility]  [facility]  AIMS Total Score 0      AUDIT    Flowsheet Row Appointment from 03/24/2023 in [facility] Admission (Discharged) [facility] Admission (Discharged) from OP [facility]  Alcohol Use Disorder Identification Test Final Score (AUDIT) 5 2 3       GAD-7    Flowsheet Row Office Visit from 03/10/2023 in [facility] Video Visit from 09/14/2022 in [facility] Video Visit from 08/04/2022 in [facility] Video Visit from 06/16/2022 in [facility] Video Visit from 04/14/2022 in [facility]  Total GAD-7 Score 6 5 4 1 2       PHQ2-9    Flowsheet Row Office Visit from 03/10/2023 in [facility] Video Visit from 09/14/2022 in [facility] Video Visit from 08/04/2022 in [facility] Video Visit from 06/16/2022 in [facility] Video Visit from 04/14/2022 in [facility]  PHQ-2 Total Score 1 1 1 1 3   PHQ-9 Total Score 7 -- -- -- 9      Flowsheet Row Video Visit from 09/14/2022 in [facility] Video Visit from 08/04/2022 in [facility] Video Visit from 06/16/2022 in [facility]  C-SSRS RISK CATEGORY Moderate Risk Low Risk Low Risk       Collaboration of Care: Collaboration of Care:   Patient/Guardian was advised Release of Information must be obtained prior to any record release in order to  collaborate their care with an outside provider. Patient/Guardian was advised if they have not already done so to contact the registration department to  sign all necessary forms in order for [place] to release information regarding their care.   Consent: Patient/Guardian gives verbal consent for treatment and assignment of benefits for services provided during this visit. Patient/Guardian expressed understanding and agreed to proceed.   Park Pope, MD 11/29/2023, 6:45 PM

## 2023-09-01 ENCOUNTER — Telehealth (HOSPITAL_COMMUNITY): Payer: MEDICAID | Admitting: Student

## 2023-09-01 DIAGNOSIS — R000 Tachycardia, unspecified: Secondary | ICD-10-CM

## 2023-09-01 DIAGNOSIS — F900 Attention-deficit hyperactivity disorder, predominantly inattentive type: Secondary | ICD-10-CM

## 2023-09-01 DIAGNOSIS — G4700 Insomnia, unspecified: Secondary | ICD-10-CM | POA: Diagnosis not present

## 2023-09-01 DIAGNOSIS — F3162 Bipolar disorder, current episode mixed, moderate: Secondary | ICD-10-CM | POA: Diagnosis not present

## 2023-09-01 DIAGNOSIS — F411 Generalized anxiety disorder: Secondary | ICD-10-CM

## 2023-09-02 ENCOUNTER — Encounter (HOSPITAL_COMMUNITY): Payer: Self-pay | Admitting: Student

## 2023-09-02 MED ORDER — GABAPENTIN 100 MG PO CAPS
100.0000 mg | ORAL_CAPSULE | Freq: Four times a day (QID) | ORAL | 1 refills | Status: DC
Start: 2023-09-02 — End: 2023-09-16

## 2023-09-02 MED ORDER — FLUOXETINE HCL 20 MG PO CAPS
20.0000 mg | ORAL_CAPSULE | Freq: Every day | ORAL | 1 refills | Status: DC
Start: 2023-09-02 — End: 2023-09-13

## 2023-09-02 MED ORDER — LURASIDONE HCL 120 MG PO TABS
120.0000 mg | ORAL_TABLET | Freq: Every evening | ORAL | 1 refills | Status: DC
Start: 2023-09-02 — End: 2023-09-16

## 2023-09-02 MED ORDER — AMPHETAMINE-DEXTROAMPHET ER 30 MG PO CP24
30.0000 mg | ORAL_CAPSULE | Freq: Every day | ORAL | 0 refills | Status: DC
Start: 2023-09-02 — End: 2023-09-16

## 2023-09-02 MED ORDER — PROPRANOLOL HCL 10 MG PO TABS
10.0000 mg | ORAL_TABLET | Freq: Two times a day (BID) | ORAL | 1 refills | Status: DC
Start: 2023-09-02 — End: 2023-11-04

## 2023-09-12 ENCOUNTER — Encounter: Payer: Self-pay | Admitting: Family Medicine

## 2023-09-12 ENCOUNTER — Ambulatory Visit: Payer: MEDICAID | Admitting: Family Medicine

## 2023-09-12 VITALS — BP 113/78 | HR 96 | Temp 97.8°F | Resp 18 | Ht 72.0 in | Wt 309.6 lb

## 2023-09-12 DIAGNOSIS — Z28311 Partially vaccinated for covid-19: Secondary | ICD-10-CM

## 2023-09-12 DIAGNOSIS — Z23 Encounter for immunization: Secondary | ICD-10-CM

## 2023-09-12 DIAGNOSIS — F411 Generalized anxiety disorder: Secondary | ICD-10-CM

## 2023-09-12 DIAGNOSIS — E781 Pure hyperglyceridemia: Secondary | ICD-10-CM | POA: Diagnosis not present

## 2023-09-12 DIAGNOSIS — R000 Tachycardia, unspecified: Secondary | ICD-10-CM

## 2023-09-12 NOTE — Progress Notes (Signed)
Established Patient Office Visit  Subjective   Patient ID: Wanda Torres, female    DOB: 08/21/1991  Age: 32 y.o. MRN: [id number]  Chief Complaint  Patient presents with   Medical Management of Chronic Issues    Patient is here for a 6 month follow up    HPI  Hypertriglyceridemia She's been taking the Tricor 145mg  daily. Not fasting today but will return for fasting labs. She will plan to return tomorrow. Tolerating medicine. She is working on diet and lifestyle changes. Reports she now has a Systems analyst. Needs to reschedule her visit with [facility].   Tachycardia/Anxiety Was started on Propranolol 10mg  BID due to elevated heart rate. She was seeing Psychiatry who wanted to increase her ADHD medicine but didn't want to until her heart rate was under control. She was placed on this and tolerated the Propranolol well. It's controlling her heart rate along with helping with some of her anxiety.  Pt would like covid booster and flu vaccine today.  Review of Systems  All other systems reviewed and are negative.    Objective:     BP 113/78   Pulse 96   Temp 97.8 F (36.6 C) (Oral)   Resp 18   Ht 6' (1.829 m)   Wt (!) 309 lb 9.6 oz (140.4 kg)   SpO2 96%   BMI 41.99 kg/m  BP Readings from Last 3 Encounters:  09/12/23 113/78  07/28/23 120/84  05/10/23 125/84      Physical Exam Vitals and nursing note reviewed.  Constitutional:      Appearance: Normal appearance. She is normal weight.  HENT:     Head: Normocephalic and atraumatic.     Right Ear: External ear normal.     Left Ear: External ear normal.     Nose: Nose normal.     Mouth/Throat:     Mouth: Mucous membranes are moist.     Pharynx: Oropharynx is clear.  Eyes:     Conjunctiva/sclera: Conjunctivae normal.     Pupils: Pupils are equal, round, and reactive to light.  Cardiovascular:     Rate and Rhythm: Normal rate and regular rhythm.     Pulses: Normal pulses.     Heart sounds: Normal heart sounds.   Pulmonary:     Effort: Pulmonary effort is normal.     Breath sounds: Normal breath sounds.  Skin:    General: Skin is warm.     Capillary Refill: Capillary refill takes less than 2 seconds.  Neurological:     General: No focal deficit present.     Mental Status: She is alert and oriented to person, place, and time. Mental status is at baseline.  Psychiatric:        Mood and Affect: Mood normal.        Behavior: Behavior normal.        Thought Content: Thought content normal.        Judgment: Judgment normal.    No results found for any visits on 09/12/23.     The ASCVD Risk score (Arnett DK, et al., 2019) failed to calculate for the following reasons:   The 2019 ASCVD risk score is only valid for ages 62 to 42    Assessment & Plan:   Problem List Items Addressed This Visit   None  Hypertriglyceridemia -     Lipid panel  Tachycardia  Generalized anxiety disorder  Need for influenza vaccination -     Flu vaccine trivalent PF,  6mos and older(Flulaval,Afluria,Fluarix,Fluzone)  Need for second dose of COVID-19 vaccine -     PFIZER Comirnaty(GRAY TOP)COVID-19 Vaccine   To return for lipid panel when she's fasting. Orders placed. Will await results before refilling the Tricor to make sure the dose is appropriate. Continue Propranolol 10 mg BID for now to help with tachycardia and anxiety.  Flu and Covid vaccines today Plan to see in March 2025 for CPE. No follow-ups on file.   Total time spent with patient today 20 minutes. This includes reviewing records, evaluating the patient and coordinating care. Face-to-face time >50%.  Suzan Slick, MD

## 2023-09-13 ENCOUNTER — Encounter (HOSPITAL_COMMUNITY): Payer: Self-pay | Admitting: Student

## 2023-09-13 ENCOUNTER — Telehealth (HOSPITAL_COMMUNITY): Payer: MEDICAID | Admitting: Student

## 2023-09-13 DIAGNOSIS — F3162 Bipolar disorder, current episode mixed, moderate: Secondary | ICD-10-CM | POA: Diagnosis not present

## 2023-09-13 MED ORDER — FLUOXETINE HCL 40 MG PO CAPS
40.0000 mg | ORAL_CAPSULE | Freq: Every day | ORAL | 0 refills | Status: DC
Start: 2023-09-13 — End: 2023-09-16

## 2023-09-13 NOTE — Addendum Note (Signed)
Addended by: Park Pope B on: 09/13/2023 12:23 PM   Modules accepted: Level of Service

## 2023-09-13 NOTE — Progress Notes (Addendum)
BH MD Outpatient Progress Note  09/13/2023 12:20 PM Wanda Torres  MRN:  [id number]  Assessment:  Wanda Torres presents for follow-up evaluation virtually. Today, 09/13/23, patient reports worsening symptoms of depression since direct switch from duloxetine to fluoxetine. She had scheduled earlier appointment in order to assess need for increase fluoxetine. Decision to increase fluoxetine to 40 mg was made. Of note, she did report she normally takes gabapentin 100 mg tid with 100 mg PRN which she took last night and it helped. Patient is exploring ketamine and TMS options with [facility] and has follow up appointment for options this Thursday. Plan to continue other psychotropics as prescribed.   Identifying Information: Wanda Torres is a 32 y.o. female with a history of Bipolar Disorder, GAD, ADHD who is an established patient with [facility] for medication management.    Plan:  # Bipolar Disorder, currently depressed Past medication trials: zoloft (ineffective), bupropion (irritability), duloxetine Status of problem: improving Interventions: -- Continue lurasidone 120 mg daily -- INCREASE fluoxetine to 40 mg daily -- Will inquire regarding psychotherapy  # ADHD Past medication trials:  Status of problem: stable Interventions: -- continue adderall XR 30 mg daily  -Needs to have ADHD testing results faxed from Dr. Eppie Gibson office  # Generalized Anxiety Disorder Past medication trials:  Status of problem: stable Interventions: -- continue gabapentin 100 mg 4 times daily --Continue propranolol 10 mg twice daily --continue trazodone 50 mg at bedtime prn for insomnia  Return to care in 1 month  Patient was given contact information for behavioral health clinic and was instructed to call 911 for emergencies.    Patient and plan of care will be discussed with the Attending MD, Dr. Josephina Shih, who agrees with the above statement and  plan.   Subjective:  Chief Complaint:  Chief Complaint  Patient presents with   Medication Management    Interval History:  Patient reports earlier appointment due to having concerns that her depression symptoms have worsened.  She has noticed she has had significant increase in frequency and duration of crying spells as well as difficulty to get out of bed, and worsening depressed mood.  She reports last night she had difficulty stopping herself from crying.  She reports no significant stressors since 2 weeks ago when direct switch of antidepressant occurred.  She denies present SI/HI/AVH.  However, she did report having passive suicidal ideation yesterday due to her worsening depressed symptoms.  She reports that she normally takes her gabapentin 3 times a day with the fourth dose as needed.  When she took the fourth dose last night, she did feel that her mood and anxiety did improve.  She reports that her present symptoms are consistent with worsening depression for her.  We discussed safety planning and she states she lives with her boyfriend and that she is comfortable talking with him about need to go to behavioral health urgent care or reach out to [phone].   Visit Diagnosis:    ICD-10-CM   1. Bipolar disorder, current episode mixed, moderate (HCC)  F31.62 FLUoxetine (PROZAC) 40 MG capsule       Past Psychiatric History:  Diagnoses: ADHD, Bipolar Disorder Previous psychiatrist/therapist: Dr. Renaldo Fiddler Hospitalizations: once for mania Suicide attempts: denies SIB: denies Hx of violence towards others: denies Current access to guns: denies Hx of trauma/abuse: denies Substance use: denies  Past Medical History:  Past Medical History:  Diagnosis Date   Acne    ADD (attention deficit disorder)  Alcohol abuse    Allergy    Anemia    Anxiety    ASCUS with positive high risk human papillomavirus of vagina 05/2022   Bipolar 1 disorder (HCC)    Depression    Depression     Drug use    Hypothyroidism    Joint pain    PCOS (polycystic ovarian syndrome)    Psoriasis    Supervision of other high risk pregnancy, antepartum 01/19/2019   Formatting of this note might be different from the original. Partner prefers She/Her pronouns   Swelling of lower extremity    Vitamin B 12 deficiency    Vitamin D deficiency     Past Surgical History:  Procedure Laterality Date   COLPOSCOPY  07/2022   ENDOCERVICAL MUCOSA WITH CHRONIC CERVICITIS.   NO SQUAMOUS MUCOSA IDENTIFIED.   NO EVIDENCE OF DYSPLASIA OR MALIGNANCY.     Family History:  Family History  Problem Relation Age of Onset   Anxiety disorder Mother    Depression Mother    Sexual abuse Mother    Hypothyroidism Mother    High blood pressure Mother    High Cholesterol Mother    Drug abuse Father    ADD / ADHD Sister    Anxiety disorder Sister    Bipolar disorder Sister    Depression Sister    Drug abuse Sister    Sexual abuse Sister    Anxiety disorder Brother    Depression Brother    Sexual abuse Brother    Thyroid disease Maternal Grandmother        THyroid cancer   Depression Maternal Grandmother    Sexual abuse Maternal Grandmother    Alcohol abuse Maternal Grandfather    Dementia Maternal Grandfather    Depression Maternal Grandfather    Sexual abuse Maternal Grandfather    Alcohol abuse Maternal Aunt    Bipolar disorder Maternal Aunt    Depression Maternal Aunt    Sexual abuse Maternal Aunt    Bipolar disorder Maternal Aunt    Depression Maternal Aunt    Sexual abuse Maternal Aunt    ADD / ADHD Maternal Uncle    Alcohol abuse Maternal Uncle    Drug abuse Maternal Uncle    Sexual abuse Maternal Uncle     Social History:  Academic/Vocational: unemployed Social History   Socioeconomic History   Marital status: Divorced    Spouse name: Not on file   Number of children: 4   Years of education: Not on file   Highest education level: Some college, no degree  Occupational History     Comment: Supervisor reservations; 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[employer]  Tobacco Use   Smoking status: Never   Smokeless tobacco: Never   Tobacco comments:    Vaps daily   Vaping Use   Vaping status: Every Day   Substances: Nicotine  Substance and Sexual Activity   Alcohol use: Yes    Alcohol/week: 1.0 standard drink of alcohol    Types: 1 Standard drinks or equivalent per week    Comment: reports one a week   Drug use: Never   Sexual activity: Not on file  Other Topics Concern   Not on file  Social History Narrative   Not on file   Social Determinants of Health   Financial Resource Strain: Low Risk  (03/23/2023)   Overall Financial Resource Strain (CARDIA)    Difficulty of Paying Living Expenses: Not hard at all  Food Insecurity: Food Insecurity Present (03/23/2023)  Hunger Vital Sign    Worried About Running Out of Food in the Last Year: Sometimes true    Ran Out of Food in the Last Year: Never true  Transportation Needs: No Transportation Needs (03/23/2023)   PRAPARE - Administrator, Civil Service (Medical): No    Lack of Transportation (Non-Medical): No  Physical Activity: Insufficiently Active (03/23/2023)   Exercise Vital Sign    Days of Exercise per Week: 3 days    Minutes of Exercise per Session: 30 min  Stress: Stress Concern Present (03/23/2023)   [employer] of Occupational Health - Occupational Stress Questionnaire    Feeling of Stress : To some extent  Social Connections: Moderately Isolated (03/23/2023)   Social Connection and Isolation Panel [NHANES]    Frequency of Communication with Friends and Family: More than three times a week    Frequency of Social Gatherings with Friends and Family: Once a week    Attends Religious Services: Never    Database administrator or Organizations: No    Attends Engineer, structural: Not on file    Marital Status: Living with partner    Allergies: No Known Allergies  Current Medications: Current  Outpatient Medications  Medication Sig Dispense Refill   amphetamine-dextroamphetamine (ADDERALL XR) 30 MG 24 hr capsule Take 1 capsule (30 mg total) by mouth daily. 30 capsule 0   amphetamine-dextroamphetamine (ADDERALL XR) 30 MG 24 hr capsule Take 1 capsule (30 mg total) by mouth daily. 30 capsule 0   Cetirizine HCl (ZYRTEC ALLERGY PO) Take by mouth.     Cholecalciferol (VITAMIN D-3 PO) Take by mouth.     Cyanocobalamin (VITAMIN B-12 PO) Take by mouth.     fenofibrate (TRICOR) 145 MG tablet Take 1 tablet (145 mg total) by mouth daily. 90 tablet 1   FLUoxetine (PROZAC) 40 MG capsule Take 1 capsule (40 mg total) by mouth daily. 30 capsule 0   gabapentin (NEURONTIN) 100 MG capsule Take 1 capsule (100 mg total) by mouth 4 (four) times daily. 120 capsule 1   Lurasidone HCl (LATUDA) 120 MG TABS Take 1 tablet (120 mg total) by mouth every evening. 30 tablet 1   metFORMIN (GLUCOPHAGE) 500 MG tablet Take 1 tablet (500 mg total) by mouth daily with breakfast. TAKE 1 TABLET(500 MG) BY MOUTH DAILY WITH BREAKFAST 90 tablet 0   propranolol (INDERAL) 10 MG tablet Take 1 tablet (10 mg total) by mouth 2 (two) times daily. 180 tablet 1   traZODone (DESYREL) 50 MG tablet Take 1 tablet (50 mg total) by mouth at bedtime as needed. for sleep 90 tablet 1   No current facility-administered medications for this visit.    ROS: Review of Systems   Objective:  Psychiatric Specialty Exam: There were no vitals taken for this visit.There is no height or weight on file to calculate BMI.  General Appearance: Fairly Groomed  Eye Contact:  Good  Speech:  Clear and Coherent and Normal Rate  Volume:  Normal  Mood:  Euthymic  Affect:  Appropriate and Congruent  Thought Content: Logical   Suicidal Thoughts:  No  Homicidal Thoughts:  No  Thought Process:  Coherent, Goal Directed, and Linear  Orientation:  Full (Time, Place, and Person)    Memory: Remote;   Good  Judgment:  Fair  Insight:  Good  Concentration:   Concentration: Good  Recall: not formally assessed   Fund of Knowledge: Fair  Language: Fair  Psychomotor Activity:  Normal  Akathisia:  Negative  AIMS (if indicated): not done  Assets:  Communication Skills Desire for Improvement Financial Resources/Insurance Housing Leisure Time Physical Health Resilience Social Support Talents/Skills Transportation  ADL's:  Intact  Cognition: WNL  Sleep:  Good   PE: General: well-appearing; no acute distress  Pulm: no increased work of breathing on room air  Strength & Muscle Tone: within normal limits Neuro: no focal neurological deficits observed  Gait & Station: normal  Metabolic Disorder Labs: Lab Results  Component Value Date   HGBA1C 4.8 03/10/2023   MPG 79.58 07/09/2020   No results found for: "PROLACTIN" Lab Results  Component Value Date   CHOL 112 04/06/2023   TRIG 677 (HH) 04/06/2023   HDL 61 04/06/2023   CHOLHDL 2.1 03/10/2023   VLDL 29 07/09/2020   LDLCALC Comment (A) 04/06/2023   LDLCALC 9 03/10/2023   Lab Results  Component Value Date   TSH 3.450 03/10/2023   TSH 2.264 07/09/2020    Therapeutic Level Labs: No results found for: "LITHIUM" No results found for: "VALPROATE" No results found for: "CBMZ"  Screenings: AIMS    Flowsheet Row [facility] (Discharged[facility]  AIMS Total Score 0      AUDIT    Flowsheet Row Appointment from 03/24/2023 in [facility] Ad[facility] (Discharged) [facility] Admission (Discharged) from [facility]  Alcohol Use Disorder Identification Test Final Score (AUDIT) 5 2 3       GAD-7    Flowsheet Row Office Visit from 03/10/2023 in [facility] Video Visit from 09/14/2022 in [facility] Video Visit from  08/04/2022 in [facility] Video Visit from 06/16/2022 in [facility] Video Visit from 04/14/2022 in [facility]  Total GAD-7 Score 6 5 4 1 2       PHQ2-9    Flowsheet Row Office Visit from 03/10/2023 in [facility] Video Visit from 09/14/2022 in [facility] Video Visit from 08/04/2022 in [facility] Video Visit from 06/16/2022 in [facility] Video Visit from 04/14/2022 in [facility]  PHQ-2 Total Score 1 1 1 1 3   PHQ-9 Total Score 7 -- -- -- 9      Flowsheet Row Video Visit from 09/14/2022 in [facility] Video Visit from 08/04/2022 in [facility] Video Visit from 06/16/2022 in [facility]  C-SSRS RISK CATEGORY Moderate Risk Low Risk Low Risk       Collaboration of Care: Collaboration of Care:   Patient/Guardian was advised Release of Information must be obtained prior to any record release in order to collaborate their care with an outside provider. Patient/Guardian was advised if they have not already done so to contact the registration department to sign all necessary forms in order for [place] to release information regarding their care.   Consent: Patient/Guardian gives verbal consent for treatment and assignment of benefits for services provided during this visit. Patient/Guardian expressed understanding and agreed to proceed.    Televisit via video: I connected with Wanda Torres on 09/13/2023 at  8:30 AM EDT by a video enabled telemedicine application and verified that I am speaking with the correct person  using two identifiers.  Location: Patient: home Provider: office   I discussed the limitations of evaluation and management by telemedicine and the availability of in person  appointments. The patient expressed understanding and agreed to proceed.  I discussed the assessment and treatment plan with the patient. The patient was provided an opportunity to ask questions and all were answered. The patient agreed with the plan and demonstrated an understanding of the instructions.   The patient was advised to call back or seek an in-person evaluation if the symptoms worsen or if the condition fails to improve as anticipated.   Park Pope, MD 09/13/2023, 12:20 PM

## 2023-09-15 ENCOUNTER — Other Ambulatory Visit (HOSPITAL_COMMUNITY): Payer: Self-pay | Admitting: Student

## 2023-09-15 DIAGNOSIS — F3162 Bipolar disorder, current episode mixed, moderate: Secondary | ICD-10-CM

## 2023-09-15 DIAGNOSIS — F411 Generalized anxiety disorder: Secondary | ICD-10-CM

## 2023-09-15 DIAGNOSIS — F900 Attention-deficit hyperactivity disorder, predominantly inattentive type: Secondary | ICD-10-CM

## 2023-09-15 NOTE — Progress Notes (Signed)
Notified by front desk that patient presently lives in [place] which means she should be seeing the [facility] outpatient in [place] as [facility] should be seeing [facility] patients. Referral to [facility] has been made. Front desk is attempting to notify patient. I will provide additional 30 day script if necessary to ensure coverage until appointment date.

## 2023-09-16 ENCOUNTER — Other Ambulatory Visit (HOSPITAL_COMMUNITY): Payer: Self-pay | Admitting: Student

## 2023-09-16 DIAGNOSIS — R000 Tachycardia, unspecified: Secondary | ICD-10-CM

## 2023-09-16 DIAGNOSIS — F900 Attention-deficit hyperactivity disorder, predominantly inattentive type: Secondary | ICD-10-CM

## 2023-09-16 DIAGNOSIS — F411 Generalized anxiety disorder: Secondary | ICD-10-CM

## 2023-09-16 DIAGNOSIS — F3162 Bipolar disorder, current episode mixed, moderate: Secondary | ICD-10-CM

## 2023-09-16 DIAGNOSIS — G4700 Insomnia, unspecified: Secondary | ICD-10-CM

## 2023-09-16 MED ORDER — LURASIDONE HCL 120 MG PO TABS: 120.0000 mg | ORAL_TABLET | Freq: Every evening | ORAL | 1 refills | Status: DC

## 2023-09-16 MED ORDER — AMPHETAMINE-DEXTROAMPHET ER 30 MG PO CP24: 30.0000 mg | ORAL_CAPSULE | Freq: Every day | ORAL | 0 refills | Status: DC

## 2023-09-16 MED ORDER — TRAZODONE HCL 50 MG PO TABS
50.0000 mg | ORAL_TABLET | Freq: Every evening | ORAL | 1 refills | Status: DC | PRN
Start: 2023-09-16 — End: 2023-11-04

## 2023-09-16 MED ORDER — FLUOXETINE HCL 40 MG PO CAPS
40.0000 mg | ORAL_CAPSULE | Freq: Every day | ORAL | 1 refills | Status: DC
Start: 2023-09-16 — End: 2023-12-02

## 2023-09-16 MED ORDER — GABAPENTIN 100 MG PO CAPS
100.0000 mg | ORAL_CAPSULE | Freq: Four times a day (QID) | ORAL | 1 refills | Status: DC
Start: 2023-09-16 — End: 2023-11-04

## 2023-09-16 NOTE — Progress Notes (Signed)
Due to patient residing in [place], patient has been switched over to [facility] and is scheduled for an appointment on 10/25/23 with Dr. Gilmore Laroche. Plan to bridge patient with her medications until that appointment.  Park Pope MD Psychiatry Resident, PGY3

## 2023-09-20 LAB — LIPID PANEL
Chol/HDL Ratio: 2.8 ratio (ref 0.0–4.4)
Cholesterol, Total: 166 mg/dL (ref 100–199)
HDL: 59 mg/dL (ref >39)
LDL Chol Calc (NIH): 89 mg/dL (ref 0–99)
Triglycerides: 97 mg/dL (ref 0–149)
VLDL Cholesterol Cal: 18 mg/dL (ref 5–40)

## 2023-09-29 ENCOUNTER — Telehealth (HOSPITAL_COMMUNITY): Payer: MEDICAID | Admitting: Student

## 2023-09-30 ENCOUNTER — Telehealth: Payer: Self-pay

## 2023-09-30 NOTE — Telephone Encounter (Signed)
Medication management - prior authorization for patient's prescribed Lurasidone submitted online with CoverMyMeds and sent to [facility] for review and decision.

## 2023-10-04 ENCOUNTER — Telehealth (HOSPITAL_COMMUNITY): Payer: Self-pay | Admitting: *Deleted

## 2023-10-04 NOTE — Telephone Encounter (Signed)
Looked online with cover my meds. Latuda approved, called to notify pharmacy.

## 2023-10-05 ENCOUNTER — Telehealth (HOSPITAL_COMMUNITY): Payer: Self-pay | Admitting: *Deleted

## 2023-10-05 DIAGNOSIS — F900 Attention-deficit hyperactivity disorder, predominantly inattentive type: Secondary | ICD-10-CM

## 2023-10-05 MED ORDER — AMPHET-DEXTROAMPHET 3-BEAD ER 25 MG PO CP24: 25.0000 mg | ORAL_CAPSULE | Freq: Every day | ORAL | 0 refills | Status: DC

## 2023-10-05 NOTE — Telephone Encounter (Signed)
One time exception made for Adderall 25 mg.

## 2023-10-05 NOTE — Telephone Encounter (Signed)
Patient called stating that Adderall 30mg  is not available at her pharmacy but they have 25mg  available. She is asking if she can get a one time fill of Adderall 25mg  so that she can get her medication today.

## 2023-10-07 ENCOUNTER — Telehealth (HOSPITAL_COMMUNITY): Payer: Self-pay | Admitting: *Deleted

## 2023-10-07 NOTE — Telephone Encounter (Signed)
Looked online with cover my meds for prior authorization of Lurasidone 120mg . Approved until 09/29/24. Called to notify pharmacy.

## 2023-10-12 ENCOUNTER — Telehealth (HOSPITAL_COMMUNITY): Payer: MEDICAID | Admitting: Student

## 2023-10-13 ENCOUNTER — Ambulatory Visit (HOSPITAL_COMMUNITY): Payer: MEDICAID | Admitting: Licensed Clinical Social Worker

## 2023-10-13 DIAGNOSIS — F411 Generalized anxiety disorder: Secondary | ICD-10-CM

## 2023-10-13 DIAGNOSIS — F314 Bipolar disorder, current episode depressed, severe, without psychotic features: Secondary | ICD-10-CM | POA: Diagnosis not present

## 2023-10-13 DIAGNOSIS — F900 Attention-deficit hyperactivity disorder, predominantly inattentive type: Secondary | ICD-10-CM | POA: Diagnosis not present
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
## 2023-10-20 ENCOUNTER — Encounter (HOSPITAL_COMMUNITY): Payer: Self-pay

## 2023-10-20 ENCOUNTER — Ambulatory Visit (HOSPITAL_COMMUNITY): Payer: MEDICAID | Admitting: Licensed Clinical Social Worker

## 2023-10-20 DIAGNOSIS — F411 Generalized anxiety disorder: Secondary | ICD-10-CM

## 2023-10-20 DIAGNOSIS — F314 Bipolar disorder, current episode depressed, severe, without psychotic features: Secondary | ICD-10-CM

## 2023-10-20 DIAGNOSIS — F900 Attention-deficit hyperactivity disorder, predominantly inattentive type: Secondary | ICD-10-CM | POA: Diagnosis not present

## 2023-10-20 NOTE — Progress Notes (Signed)
Virtual Visit via Video Note  I connected with Wanda Torres on 10/20/23 at  3:00 PM EDT by a video enabled telemedicine application and verified that I am speaking with the correct person using two identifiers.  Location: Patient: home Provider: home office   I discussed the limitations of evaluation and management by telemedicine and the availability of in person appointments. The patient expressed understanding and agreed to proceed.  I discussed the assessment and treatment plan with the patient. The patient was provided an opportunity to ask questions and all were answered. The patient agreed with the plan and demonstrated an understanding of the instructions.   The patient was advised to call back or seek an in-person evaluation if the symptoms worsen or if the condition fails to improve as anticipated.  I provided 45 minutes of non-face-to-face time during this encounter.  THERAPIST PROGRESS NOTE  Session Time: 3:00 PM to 3:45 PM  Participation Level: Active  Behavioral Response: CasualAlertDepressed  Type of Therapy: Individual Therapy  Treatment Goals addressed: Work on decreasing depression, effective coping skills work on symptoms of trauma  ProgressTowards Goals: Progressing-worked on treatment plan today but made progress as therapist provided psychoeducation with CBT for depression so patient can begin to develop coping skills  Interventions: CBT, Solution Focused, Strength-based, Supportive, and Other: coping  Summary: Wanda Torres is a 32 y.o. female who presents with feel a little better. Stopped drinking completely. She has custody issue with oldest daughter. She is staying with sister and brother-in-law who are in AA don't like that she drinks to show that no problem stopping. Drink if stressful and had a bad day. Drinking every day couple of drinks. It was impacting oldest daughter wanted to stop drinking. Mood better. Still depressed.  Has not started  to brush her teeth but as we talked about doing more helps with depression she agrees to brush her teeth for next session.  Notes her interest in arts and crafts therapist enthusiastic about this as this will help her as well as she develops motivation this will help her feel better positive she is going with mom and aunt tomorrow to color and will show therapist her coloring next session  Therapist very enthusiastic patient has stopped drinking notes will make good progress with just taking that step she shares she is doing a little better noted things that are helping she is getting to the gym.  She is going to call her tomorrow with mom and aunt and therapist enthusiastic about this as she likes arts and crafts like possibly could jumpstart this hobby although understand realistically this will take time.  Completed treatment plan patient gave consent to complete virtually.  Introduced CBT model for depression showed patient video vicious cycle of depression to note that it is our thoughts on her behaviors that impact how we feel we usually start with what we do is usually this is where we see the most effective.  Therapist added things like thoughts tend to be gloomy distorted patient can see that, therapist said do not believe all your thoughts challenged their thoughts for accuracy often what is her opinions and not facts.  Talked about the 5-minute role do something for 5 minutes to see if she can motivate to do more.  Noted the positive effects of activity and exercise releasing endorphins positive focus feeling good about yourself for example.  Noted nature as therapy another coping.  Noted patient the 6: She for doing cognitive therapy by noting  when 1 has an extreme emotion to write down situation write down your motion and rate it, look at your physical sensations know what you are thinking and then reframe something more positive and realistic noticed what you are doing may want to change how you  respond is good to look at the situation to see triggers.  Plan for next week is for patient to brush her teeth setting a goal and she decided on this as her goal. Suicidal/Homicidal: No  Plan: Return again in 1 week.2.  Patient is to brush her teeth for the next week call her with mom and aunt show therapist her coloring 3.  Look at thoughts and feelings book look at therapist aid coping for depression, introduce mindfulness  Diagnosis: Bipolar disorder current episode depressed severe without psychotic features ADHD primarily inattentive type, Generalized anxiety disorder  Collaboration of Care: Other none needed  Patient/Guardian was advised Release of Information must be obtained prior to any record release in order to collaborate their care with an outside provider. Patient/Guardian was advised if they have not already done so to contact the registration department to sign all necessary forms in order for [place] to release information regarding their care.   Consent: Patient/Guardian gives verbal consent for treatment and assignment of benefits for services provided during this visit. Patient/Guardian expressed understanding and agreed to proceed.   Coolidge Breeze, LCSW 10/20/2023

## 2023-10-25 ENCOUNTER — Ambulatory Visit (HOSPITAL_COMMUNITY): Payer: MEDICAID | Admitting: Psychiatry

## 2023-10-27 ENCOUNTER — Ambulatory Visit (HOSPITAL_COMMUNITY): Payer: MEDICAID | Admitting: Licensed Clinical Social Worker

## 2023-10-27 DIAGNOSIS — F411 Generalized anxiety disorder: Secondary | ICD-10-CM | POA: Diagnosis not present

## 2023-10-27 DIAGNOSIS — F900 Attention-deficit hyperactivity disorder, predominantly inattentive type: Secondary | ICD-10-CM | POA: Diagnosis not present

## 2023-10-27 DIAGNOSIS — F314 Bipolar disorder, current episode depressed, severe, without psychotic features: Secondary | ICD-10-CM

## 2023-10-27 NOTE — Progress Notes (Signed)
Virtual Visit via Video Note  I connected with Wanda Torres on 10/27/23 at 11:00 AM EDT by a video enabled telemedicine application and verified that I am speaking with the correct person using two identifiers.  Location: Patient: home Provider: home office   I discussed the limitations of evaluation and management by telemedicine and the availability of in person appointments. The patient expressed understanding and agreed to proceed.  I discussed the assessment and treatment plan with the patient. The patient was provided an opportunity to ask questions and all were answered. The patient agreed with the plan and demonstrated an understanding of the instructions.   The patient was advised to call back or seek an in-person evaluation if the symptoms worsen or if the condition fails to improve as anticipated.  I provided 45 minutes of non-face-to-face time during this encounter.  THERAPIST PROGRESS NOTE  Session Time: 11:00 AM to 11:45 AM  Participation Level: Active  Behavioral Response: CasualAlertDepressed  Type of Therapy: Individual Therapy  Treatment Goals addressed: Work on decreasing depression, effective coping skills work on symptoms of trauma  ProgressTowards Goals: Progressing-life CBT to provide patient with more tools for managing depression for example putting more activities in her week to give her sense of pleasure mastery or value-based, challenging negative thoughts, looked at [employer] utilizing this program to give her significant strategies for managing emotion (see below)  Interventions: CBT, Solution Focused, Strength-based, Supportive, and Other: coping  Summary: Wanda Torres is a 32 y.o. female who presents with starting brushing her teeth once a day. Feels ok not drinking.  Therapist asked patient again about her day-to-day what activity she engages in.  She relates work out three times a work, usually have to go to [employer]  daily. Likes to get fresh vegetables. Patient is the cook. Not good at getting into yet hobbies. Seeing psychiatrist next week. Thinks need to change anti-depressant and that will help. They switched meds 2 months and not working plus libido down will talk to psychiatrist about it.  She also went to [place] with mom and on had a good time.  Reviewed CBT information on depression. The worksheet talks about the cogs that keep it going.  Noted patient addressing self-neglect as good coping as she now is brushing her teeth.  Other things that feed depression are isolating, where a focus of attention is, negative thinking.  We can challenge her negative thinking therapist showed patient ways to challenge looking whether it is fact or pinon looking at cognitive distortions and thinking, or we can change how we behave in that we usually start.  Looked at chapter from  thoughts and feelings book "Getting Mobilized".  Explained to patient one of the effects of depression is feeling immobilize it is hard to push yourself to do normal self-care activities and pleasure seems all but absent from your life.  Feeling immobilizes not only a symptom of depression it is also because.  The less you do the more depressed you feel in the more depressed you feel the less you do it is a downward spiral that maintains withdrawal from life and prolongs depression.  The solution is to push yourself to higher levels of activity even though you do not feel like it.  Technique called behavior activation can offer significant help in overcoming depression and this technique you focus on adding 3 different types of activities into your daily schedule activities that are pleasurable activities that give you a sense of mastery  and activities based on your values.  You need to write from 1-10 how much pleasure mastery or value-based the activity is.  The first week you monitor your activities then you start adding new activities.  You predict how much  pleasure, how much mastery or value-based it will provide and then see the actual result that we will help you know perhaps that you under predicted and activity.  Patient willing to monitor activities as well as to add other activities noted she found this interesting and helpful.    Looked at worksheet from [employer] for patient to learn more pools helpful for regulation. The Four-Step Method in [employer] is a structured approach designed to help individuals manage their mental health and emotional challenges. Here's a brief overview of the steps: Identification: Recognizing specific behaviors, thoughts, or feelings that are causing distress. This involves being aware of triggers and patterns. Challenge: Evaluating and questioning these negative thoughts or behaviors. Participants learn to identify cognitive distortions and consider alternative perspectives. Replacement: Developing healthier thoughts and behaviors to replace the unhelpful ones. This step encourages proactive coping strategies. Implementation: Putting the new, healthier thoughts and behaviors into practice in daily life. This involves using the skills learned in the program to navigate challenges. By following these steps, participants can gain greater control over their mental health and improve their overall well-being. The method emphasizes self-awareness, accountability, and peer support throughout the process Looked at Replacement behaviors The Four-Step Method in [employer] is a structured approach designed to help individuals manage their mental health and emotional challenges. Here's a brief overview of the steps: Identification: Recognizing specific behaviors, thoughts, or feelings that are causing distress. This involves being aware of triggers and patterns. Challenge: Evaluating and questioning these negative thoughts or behaviors. Participants learn to identify cognitive distortions and  consider alternative perspectives. Replacement: Developing healthier thoughts and behaviors to replace the unhelpful ones. This step encourages proactive coping strategies. Implementation: Putting the new, healthier thoughts and behaviors into practice in daily life. This involves using the skills learned in the program to navigate challenges. By following these steps, participants can gain greater control over their mental health and improve their overall well-being. The method emphasizes self-awareness, accountability, and peer support throughout the process Reviewed with patient interested in getting the book from [employer] and will monitor and put activities of pleasure mastery or value-based into schedule. Suicidal/Homicidal: No  Plan: Return again in 1 week.2.  Will get workbook from [employer] she will put more activities that give her pleasure, mastery and value into her weekly schedule. 3.  Therapist can continue to look at chapter 12 getting mobilized, look at chapter on values, look at first chapter on automatic thoughts  Diagnosis: Bipolar disorder current episode depressed severe without psychotic features ADHD primarily inattentive type, Generalized anxiety disorder  Collaboration of Care: Other none needed  Patient/Guardian was advised Release of Information must be obtained prior to any record release in order to collaborate their care with an outside provider. Patient/Guardian was advised if they have not already done so to contact the registration department to sign all necessary forms in order for [place] to release information regarding their care.   Consent: Patient/Guardian gives verbal consent for treatment and assignment of benefits for services provided during this visit. Patient/Guardian expressed understanding and agreed to proceed.   Coolidge Breeze, LCSW 10/27/2023

## 2023-11-01 ENCOUNTER — Ambulatory Visit (HOSPITAL_COMMUNITY): Payer: Medicaid Other | Admitting: Licensed Clinical Social Worker

## 2023-11-01 DIAGNOSIS — F314 Bipolar disorder, current episode depressed, severe, without psychotic features: Secondary | ICD-10-CM | POA: Diagnosis not present

## 2023-11-01 DIAGNOSIS — F411 Generalized anxiety disorder: Secondary | ICD-10-CM

## 2023-11-01 DIAGNOSIS — F900 Attention-deficit hyperactivity disorder, predominantly inattentive type: Secondary | ICD-10-CM | POA: Diagnosis not present

## 2023-11-01 NOTE — Progress Notes (Signed)
Virtual Visit via Video Note  I connected with Wanda Torres on 11/01/23 at  2:00 PM EST by a video enabled telemedicine application and verified that I am speaking with the correct person using two identifiers.  Location: Patient: home Provider: home office   I discussed the limitations of evaluation and management by telemedicine and the availability of in person appointments. The patient expressed understanding and agreed to proceed.   I discussed the assessment and treatment plan with the patient. The patient was provided an opportunity to ask questions and all were answered. The patient agreed with the plan and demonstrated an understanding of the instructions.   The patient was advised to call back or seek an in-person evaluation if the symptoms worsen or if the condition fails to improve as anticipated.  I provided 20 minutes of non-face-to-face time during this encounter.  THERAPIST PROGRESS NOTE  Session Time: 2:00 PM to 2:20 PM  Participation Level: Active  Behavioral Response: CasualAlertappropriate  Type of Therapy: Individual Therapy  Treatment Goals addressed:  Work on decreasing depression, effective coping skills work on symptoms of trauma  ProgressTowards Goals: Progressing-patient staying on track shows motivation to learn and apply coping skills processed thoughts and feelings in session to help with coping  Interventions: Solution Focused, Strength-based, Supportive, and Other: coping  Summary: WALESKA BUTTERY is a 32 y.o. female who presents with has been brushing her teeth. Haven't tracked days but plan on it. Doing good. Therapist reviewed seeing effort patient is making another thing she has done is that she has stopped drinking.  Therapist described plan teaching her coping patient trying the coping skills and then relating if they helped or not continuing to build them as well as find ones that are effective.  Session short as patient has still not  implemented some of coping skills just saw her last week and has sessions in the future. Therapist provided support and space for patient to talk about thoughts and feelings in session.  Suicidal/Homicidal: No  Plan: Return again in 4 weeks.2.  Can continue to look at getting mobilized, look at section of suggestions for activities that are fun p.[id number] can look at first chapter automatic thoughts, patient still needs to put things in her schedule to help with mood, can look at chapter on values, patient get book from [employer]   Diagnosis: Bipolar disorder current episode depressed severe without psychotic features ADHD primarily inattentive type, Generalized anxiety disorder  Collaboration of Care: Other none needed  Patient/Guardian was advised Release of Information must be obtained prior to any record release in order to collaborate their care with an outside provider. Patient/Guardian was advised if they have not already done so to contact the registration department to sign all necessary forms in order for [place] to release information regarding their care.   Consent: Patient/Guardian gives verbal consent for treatment and assignment of benefits for services provided during this visit. Patient/Guardian expressed understanding and agreed to proceed.   Coolidge Breeze, LCSW 11/01/2023

## 2023-11-03 ENCOUNTER — Ambulatory Visit (INDEPENDENT_AMBULATORY_CARE_PROVIDER_SITE_OTHER): Payer: MEDICAID | Admitting: Nurse Practitioner

## 2023-11-03 ENCOUNTER — Encounter: Payer: Self-pay | Admitting: Nurse Practitioner

## 2023-11-03 VITALS — BP 100/67 | HR 76 | Temp 97.5°F | Ht 72.0 in | Wt 318.0 lb

## 2023-11-03 DIAGNOSIS — E65 Localized adiposity: Secondary | ICD-10-CM

## 2023-11-03 DIAGNOSIS — Z6841 Body Mass Index (BMI) 40.0 and over, adult: Secondary | ICD-10-CM | POA: Diagnosis not present

## 2023-11-03 NOTE — Progress Notes (Signed)
Office: [phone]  /  Fax: [phone]  WEIGHT SUMMARY AND BIOMETRICS  Weight Lost Since Last Visit: 0lb  Weight Gained Since Last Visit: 11lb   Vitals Temp: (!) 97.5 F (36.4 C) BP: 100/67 Pulse Rate: 76 SpO2: 97 %   Anthropometric Measurements Height: 6' (1.829 m) Weight: (!) 318 lb (144.2 kg) BMI (Calculated): 43.12 Weight at Last Visit: 307lb Weight Lost Since Last Visit: 0lb Weight Gained Since Last Visit: 11lb Starting Weight: 288lb Total Weight Loss (lbs): 0 lb (0 kg)   Body Composition  Body Fat %: 50.4 % Fat Mass (lbs): 160.4 lbs Muscle Mass (lbs): 149.8 lbs Total Body Water (lbs): 111.4 lbs Visceral Fat Rating : 14   Other Clinical Data Fasting: No Labs: No Today's Visit #: 5 Starting Date: 04/06/23     HPI  Chief Complaint: OBESITY  Wanda Torres is here to discuss her progress with her obesity treatment plan. She is on the the Category 4 Plan and states she is following her eating plan approximately 50 % of the time. She states she is exercising 30 minutes 3 days per week.   Interval History:  Since last office visit on 07/28/23 she has gained 11 pounds.    BF:  2 eggs or a protein shake Snack:  none Lunch:  none Snack:  chips Dinner:  protein with a vegetable Drinks:  water   Pharmacotherapy for weight loss: She is not currently taking medications  for medical weight loss.     Previous pharmacotherapy for medical weight loss:  none   Bariatric surgery:  Patient has not had bariatric surgery  Visceral obesity Worsening    PHYSICAL EXAM:  Blood pressure 100/67, pulse 76, temperature (!) 97.5 F (36.4 C), height 6' (1.829 m), weight (!) 318 lb (144.2 kg), SpO2 97%. Body mass index is 43.13 kg/m.  General: She is overweight, cooperative, alert, well developed, and in no acute distress. PSYCH: Has normal mood, affect and thought process.   Extremities: No edema.  Neurologic: No gross sensory or motor deficits. No tremors or  fasciculations noted.    DIAGNOSTIC DATA REVIEWED:  BMET    Component Value Date/Time   NA 139 03/10/2023 1438   K 4.2 03/10/2023 1438   CL 101 03/10/2023 1438   CO2 22 03/10/2023 1438   GLUCOSE 88 03/10/2023 1438   GLUCOSE 63 (L) 09/15/2020 2229   BUN 15 03/10/2023 1438   CREATININE 0.80 03/10/2023 1438   CALCIUM 9.5 03/10/2023 1438   GFRNONAA >60 09/15/2020 2229   GFRAA >60 09/15/2020 2229   Lab Results  Component Value Date   HGBA1C 4.8 03/10/2023   HGBA1C 4.4 (L) 07/09/2020   Lab Results  Component Value Date   INSULIN 6.0 04/06/2023   Lab Results  Component Value Date   TSH 3.450 03/10/2023   CBC    Component Value Date/Time   WBC 5.3 03/10/2023 1438   WBC 7.2 09/15/2020 2229   RBC 4.49 03/10/2023 1438   RBC 4.78 09/15/2020 2229   HGB 15.0 03/10/2023 1438   HCT 43.9 03/10/2023 1438   PLT 254 03/10/2023 1438   MCV 98 (H) 03/10/2023 1438   MCH 33.4 (H) 03/10/2023 1438   MCH 33.7 09/15/2020 2229   MCHC 34.2 03/10/2023 1438   MCHC 35.8 09/15/2020 2229   RDW 13.8 03/10/2023 1438   Iron Studies    Component Value Date/Time   IRON 79 05/01/2018 1740   Lipid Panel     Component Value Date/Time  CHOL 166 09/19/2023 1017   TRIG 97 09/19/2023 1017   HDL 59 09/19/2023 1017   CHOLHDL 2.8 09/19/2023 1017   CHOLHDL 2.2 07/09/2020 1745   VLDL 29 07/09/2020 1745   LDLCALC 89 09/19/2023 1017   Hepatic Function Panel     Component Value Date/Time   PROT 6.9 03/10/2023 1438   ALBUMIN 4.5 03/10/2023 1438   AST 26 03/10/2023 1438   ALT 29 03/10/2023 1438   ALKPHOS 68 03/10/2023 1438   BILITOT 0.5 03/10/2023 1438   BILIDIR 0.1 07/09/2020 1745   IBILI 0.6 07/09/2020 1745      Component Value Date/Time   TSH 3.450 03/10/2023 1438   Nutritional Lab Results  Component Value Date   VD25OH 41.3 03/10/2023   VD25OH 37.1 05/01/2018     ASSESSMENT AND PLAN  TREATMENT PLAN FOR OBESITY:  Recommended Dietary Goals  Wanda Torres is currently in the action  stage of change. As such, her goal is to continue weight management plan. She has agreed to the Category 4 Plan.  We had a long discussion today about her decreased caloric intake.  She needs to increase her calories to 1800 and to aim for 100+ grams of protein.  She verbalizes understanding.  Behavioral Intervention  We discussed the following Behavioral Modification Strategies today: increasing lean protein intake to established goals, increasing water intake , work on meal planning and preparation, reading food labels , keeping healthy foods at home, identifying sources and decreasing liquid calories, continue to practice mindfulness when eating, planning for success, better snacking choices, and continue to work on maintaining a reduced calorie state, getting the recommended amount of protein, incorporating whole foods, making healthy choices, staying well hydrated and practicing mindfulness when eating..  Additional resources provided today: NA  Recommended Physical Activity Goals  Wanda Torres has been advised to work up to 150 minutes of moderate intensity aerobic activity a week and strengthening exercises 2-3 times per week for cardiovascular health, weight loss maintenance and preservation of muscle mass.   She has agreed to Think about enjoyable ways to increase daily physical activity and overcoming barriers to exercise, Increase physical activity in their day and reduce sedentary time (increase NEAT)., Increase the intensity, frequency or duration of strengthening exercises , and Increase the intensity, frequency or duration of aerobic exercises     Pharmacotherapy She is requesting to start [facility].  I will not start Wegovy at this time due to her decreased caloric intake.  She needs to be aiming for at least 1800 cal and 100+ grams of protein a day.  Reginal Lutes is not an option for her at this time.  We did discuss this extensively today.  ASSOCIATED CONDITIONS ADDRESSED  TODAY  Action/Plan  Visceral obesity Worsening  Morbid obesity (HCC)  BMI 40.0-44.9, adult (HCC)         Return in about 2 weeks (around 11/17/2023).Marland Kitchen She was informed of the importance of frequent follow up visits to maximize her success with intensive lifestyle modifications for her multiple health conditions.   ATTESTASTION STATEMENTS:  Reviewed by clinician on day of visit: allergies, medications, problem list, medical history, surgical history, family history, social history, and previous encounter notes.   Time spent on visit including pre-visit chart review and post-visit care and charting was 30 minutes.    Theodis Sato. Natalya Domzalski FNP-C

## 2023-11-04 ENCOUNTER — Encounter (HOSPITAL_COMMUNITY): Payer: Self-pay | Admitting: Psychiatry

## 2023-11-04 ENCOUNTER — Ambulatory Visit (HOSPITAL_COMMUNITY): Payer: Medicaid Other | Admitting: Psychiatry

## 2023-11-04 DIAGNOSIS — F314 Bipolar disorder, current episode depressed, severe, without psychotic features: Secondary | ICD-10-CM | POA: Diagnosis not present

## 2023-11-04 DIAGNOSIS — F411 Generalized anxiety disorder: Secondary | ICD-10-CM

## 2023-11-04 DIAGNOSIS — G4700 Insomnia, unspecified: Secondary | ICD-10-CM | POA: Diagnosis not present

## 2023-11-04 DIAGNOSIS — F3162 Bipolar disorder, current episode mixed, moderate: Secondary | ICD-10-CM

## 2023-11-04 DIAGNOSIS — F900 Attention-deficit hyperactivity disorder, predominantly inattentive type: Secondary | ICD-10-CM

## 2023-11-04 DIAGNOSIS — R000 Tachycardia, unspecified: Secondary | ICD-10-CM

## 2023-11-04 MED ORDER — BUPROPION HCL ER (SR) 100 MG PO TB12: 100.0000 mg | ORAL_TABLET | Freq: Two times a day (BID) | ORAL | 0 refills | Status: DC

## 2023-11-04 MED ORDER — PROPRANOLOL HCL 10 MG PO TABS
10.0000 mg | ORAL_TABLET | Freq: Every day | ORAL | 0 refills | Status: DC
Start: 2023-11-04 — End: 2023-12-02

## 2023-11-04 MED ORDER — AMPHET-DEXTROAMPHET 3-BEAD ER 25 MG PO CP24: 25.0000 mg | ORAL_CAPSULE | Freq: Every day | ORAL | 0 refills | Status: DC

## 2023-11-04 MED ORDER — LURASIDONE HCL 120 MG PO TABS
120.0000 mg | ORAL_TABLET | Freq: Every evening | ORAL | 1 refills | Status: DC
Start: 2023-11-04 — End: 2024-03-07

## 2023-11-04 MED ORDER — GABAPENTIN 100 MG PO CAPS
100.0000 mg | ORAL_CAPSULE | Freq: Three times a day (TID) | ORAL | 0 refills | Status: DC
Start: 2023-11-04 — End: 2023-12-02

## 2023-11-04 NOTE — Progress Notes (Signed)
Psychiatric Initial Adult Assessment   Patient Identification: Wanda Torres MRN:  [id number] Date of Evaluation:  11/04/2023 Referral Source: [facility] . Dr. Hazle Quant Chief Complaint:   Chief Complaint  Patient presents with   Establish Care   Visit Diagnosis:    ICD-10-CM   1. Bipolar disorder, current episode depressed, severe, without psychotic features (HCC)  F31.4     2. Generalized anxiety disorder  F41.1 gabapentin (NEURONTIN) 100 MG capsule    3. Attention deficit hyperactivity disorder (ADHD), predominantly inattentive type  F90.0 Amphet-Dextroamphet 3-Bead ER 25 MG CP24    4. Insomnia, unspecified type  G47.00     5. Tachycardia  R00.0 propranolol (INDERAL) 10 MG tablet    6. Bipolar disorder, current episode mixed, moderate (HCC)  F31.62 Lurasidone HCl (LATUDA) 120 MG TABS     Virtual Visit via Video Note  I connected with Wanda Torres on 11/04/23 at 11:00 AM EST by a video enabled telemedicine application and verified that I am speaking with the correct person using two identifiers.  Location: Patient: home Provider: home office   I discussed the limitations of evaluation and management by telemedicine and the availability of in person appointments. The patient expressed understanding and agreed to proceed.      I discussed the assessment and treatment plan with the patient. The patient was provided an opportunity to ask questions and all were answered. The patient agreed with the plan and demonstrated an understanding of the instructions.   The patient was advised to call back or seek an in-person evaluation if the symptoms worsen or if the condition fails to improve as anticipated.  I provided 60 minutes of non-face-to-face time during this encounter.   History of Present Illness: Patient is a 32 years old white female currently living with her fianc referred by [facility] as she is a resident of [facility] in [place] so transferred from [facility] patient.  Diagnosed with bipolar, ADD, anxiety.  Patient currently living with her fianc she has 3 kids were not living with her and 2 of the kids are with her ex and 1 kid is with her sister she is trying to get their custody kids were taken prior to hospital admission in 2021 according to her when she had an episode of depression with suicidality.  Patient gives a long history of having episodes of depression and also episodes of seemingly mania or hypomania with increased energy risk-taking activities decreased need for sleep increasing goal-directed activity.  She also describes episodes of depression with a motivation decreased energy feeling of hopelessness suicidal thoughts and sadness  She is also getting treatment for ADD she was supposed to bring the ADD testing but she is stating doesn't know the name or where the testing is.   She was lying in the bed feeling somewhat tired and sleepy and yawning she still takes trazodone at night she feels tired and a motivated room during the day  She was started on increased Prozac to 40 mg according to her last visit she felt the depression was somewhat better on the Prozac and it was helping but now it is affect is flat.  She is noticing decreased libido and wants to change or discussed options  She worries about her kids about her kids custody in the room that is her main anxiety and worries she has been diagnosed with generalized anxiety and panic attacks in the past  She has been drinking up to this 2 weeks ago  but now stopped she understands the risk of using alcohol and its effect to medication and judgment including depression  Aggravating factors; kids custody concerns. Modifying factors; her kids, fianc, her animals  Duration more than 10 years Severity endorses feeling down depressed or a motivated with tiredness but not hopelessness or suicidal thoughts    Past Psychiatric History: Bipolar, anxiety, depression  Previous  Psychotropic Medications: Yes  Wellbutrin, cymbalta, zoloft . Says didn't work  Substance Abuse History in the last 12 months:  Yes.    Consequences of Substance Abuse: Has been drinking till last 2 weeks, discussed abstinence and alcohol effect to depression, judjement  Past Medical History:  Past Medical History:  Diagnosis Date   Acne    ADD (attention deficit disorder)    Alcohol abuse    Allergy    Anemia    Anxiety    ASCUS with positive high risk human papillomavirus of vagina 05/2022   Bipolar 1 disorder (HCC)    Depression    Depression    Drug use    Hypothyroidism    Joint pain    PCOS (polycystic ovarian syndrome)    Psoriasis    Supervision of other high risk pregnancy, antepartum 01/19/2019   Formatting of this note might be different from the original. Partner prefers She/Her pronouns   Swelling of lower extremity    Vitamin B 12 deficiency    Vitamin D deficiency     Past Surgical History:  Procedure Laterality Date   COLPOSCOPY  07/2022   ENDOCERVICAL MUCOSA WITH CHRONIC CERVICITIS.   NO SQUAMOUS MUCOSA IDENTIFIED.   NO EVIDENCE OF DYSPLASIA OR MALIGNANCY.    Family Psychiatric History: Aunt : bipolar, Mom; depression  Family History:  Family History  Problem Relation Age of Onset   Anxiety disorder Mother    Depression Mother    Sexual abuse Mother    Hypothyroidism Mother    High blood pressure Mother    High Cholesterol Mother    Drug abuse Father    ADD / ADHD Sister    Anxiety disorder Sister    Bipolar disorder Sister    Depression Sister    Drug abuse Sister    Sexual abuse Sister    Anxiety disorder Brother    Depression Brother    Sexual abuse Brother    Thyroid disease Maternal Grandmother        THyroid cancer   Depression Maternal Grandmother    Sexual abuse Maternal Grandmother    Alcohol abuse Maternal Grandfather    Dementia Maternal Grandfather    Depression Maternal Grandfather    Sexual abuse Maternal Grandfather     Alcohol abuse Maternal Aunt    Bipolar disorder Maternal Aunt    Depression Maternal Aunt    Sexual abuse Maternal Aunt    Bipolar disorder Maternal Aunt    Depression Maternal Aunt    Sexual abuse Maternal Aunt    ADD / ADHD Maternal Uncle    Alcohol abuse Maternal Uncle    Drug abuse Maternal Uncle    Sexual abuse Maternal Uncle     Social History:   Social History   Socioeconomic History   Marital status: Divorced    Spouse name: Not on file   Number of children: 4   Years of education: Not on file   Highest education level: Some college, no degree  Occupational History    Comment: Supervisor reservations; 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[employer]  Tobacco Use   Smoking status:  Never   Smokeless tobacco: Never   Tobacco comments:    Vaps daily   Vaping Use   Vaping status: Every Day   Substances: Nicotine  Substance and Sexual Activity   Alcohol use: Yes    Alcohol/week: 1.0 standard drink of alcohol    Types: 1 Standard drinks or equivalent per week    Comment: reports one a week   Drug use: Never   Sexual activity: Not on file  Other Topics Concern   Not on file  Social History Narrative   Not on file   Social Determinants of Health   Financial Resource Strain: Low Risk  (03/23/2023)   Overall Financial Resource Strain (CARDIA)    Difficulty of Paying Living Expenses: Not hard at all  Food Insecurity: Food Insecurity Present (03/23/2023)   Hunger Vital Sign    Worried About Running Out of Food in the Last Year: Sometimes true    Ran Out of Food in the Last Year: Never true  Transportation Needs: No Transportation Needs (03/23/2023)   PRAPARE - Administrator, Civil Service (Medical): No    Lack of Transportation (Non-Medical): No  Physical Activity: Insufficiently Active (03/23/2023)   Exercise Vital Sign    Days of Exercise per Week: 3 days    Minutes of Exercise per Session: 30 min  Stress: Stress Concern Present (03/23/2023)   [employer] of  Occupational Health - Occupational Stress Questionnaire    Feeling of Stress : To some extent  Social Connections: Moderately Isolated (03/23/2023)   Social Connection and Isolation Panel [NHANES]    Frequency of Communication with Friends and Family: More than three times a week    Frequency of Social Gatherings with Friends and Family: Once a week    Attends Religious Services: Never    Database administrator or Organizations: No    Attends Engineer, structural: Not on file    Marital Status: Living with partner    Additional Social History: grew up with mom and sister, some challenges, denies abuse Has 3 kids and filing for custody  Allergies:  No Known Allergies  Metabolic Disorder Labs: Lab Results  Component Value Date   HGBA1C 4.8 03/10/2023   MPG 79.58 07/09/2020   No results found for: "PROLACTIN" Lab Results  Component Value Date   CHOL 166 09/19/2023   TRIG 97 09/19/2023   HDL 59 09/19/2023   CHOLHDL 2.8 09/19/2023   VLDL 29 07/09/2020   LDLCALC 89 09/19/2023   LDLCALC Comment (A) 04/06/2023   Lab Results  Component Value Date   TSH 3.450 03/10/2023    Therapeutic Level Labs: No results found for: "LITHIUM" No results found for: "CBMZ" No results found for: "VALPROATE"  Current Medications: Current Outpatient Medications  Medication Sig Dispense Refill   buPROPion ER (WELLBUTRIN SR) 100 MG 12 hr tablet Take 1 tablet (100 mg total) by mouth 2 (two) times daily. 30 tablet 0   Amphet-Dextroamphet 3-Bead ER 25 MG CP24 Take 1 capsule (25 mg total) by mouth daily. 30 capsule 0   Cetirizine HCl (ZYRTEC ALLERGY PO) Take by mouth.     Cholecalciferol (VITAMIN D-3 PO) Take by mouth.     Cyanocobalamin (VITAMIN B-12 PO) Take by mouth.     fenofibrate (TRICOR) 145 MG tablet Take 1 tablet (145 mg total) by mouth daily. 90 tablet 1   FLUoxetine (PROZAC) 40 MG capsule Take 1 capsule (40 mg total) by mouth daily. 30 capsule 1  gabapentin (NEURONTIN) 100 MG  capsule Take 1 capsule (100 mg total) by mouth 3 (three) times daily. 90 capsule 0   Lurasidone HCl (LATUDA) 120 MG TABS Take 1 tablet (120 mg total) by mouth every evening. Take with food 30 tablet 1   metFORMIN (GLUCOPHAGE) 500 MG tablet Take 1 tablet (500 mg total) by mouth daily with breakfast. TAKE 1 TABLET(500 MG) BY MOUTH DAILY WITH BREAKFAST 90 tablet 0   propranolol (INDERAL) 10 MG tablet Take 1 tablet (10 mg total) by mouth daily. 30 tablet 0   No current facility-administered medications for this visit.     Psychiatric Specialty Exam: Review of Systems  Cardiovascular:  Negative for chest pain.  Neurological:  Negative for tremors.  Psychiatric/Behavioral:  Positive for dysphoric mood.     There were no vitals taken for this visit.There is no height or weight on file to calculate BMI.  General Appearance: Casual  Eye Contact:  Fair  Speech:  Slow  Volume:  Decreased  Mood:   somewhat subdued  Affect:  Congruent  Thought Process:  Goal Directed  Orientation:  Full (Time, Place, and Person)  Thought Content:  Rumination  Suicidal Thoughts:  No  Homicidal Thoughts:  No  Memory:  Immediate;   Fair  Judgement:  Fair  Insight:  Shallow  Psychomotor Activity:  Decreased  Concentration:  Concentration: Fair  Recall:  [employer] of Knowledge:Fair  Language: Fair  Akathisia:  No  Handed:    AIMS (if indicated):  no involuntary movements   Assets:  Desire for Improvement  ADL's:  Intact  Cognition: WNL  Sleep:  Fair   Screenings: AIMS    Flowsheet Row Ad[facility] (Discharged) [facility]  AIMS Total Score 0      AUDIT    Flowsheet Row Appointment from 03/24/2023 in [facility] Admission (Discharged) [facility] Admission (Discharged) from OP [facility]  Alcohol  Use Disorder Identification Test Final Score (AUDIT) 5 2 3       GAD-7    Flowsheet Row Office Visit from 03/10/2023 in [facility] Video Visit from 09/14/2022 in [facility] Video Visit from 08/04/2022 in [facility] Video Visit from 06/16/2022 in [facility] Video Visit from 04/14/2022 in [facility]  Total GAD-7 Score 6 5 4 1 2       PHQ2-9    Flowsheet Row Office Visit from 11/04/2023 in [facility] Counselor from 10/13/2023 in [facility] at [facility] Office Visit from 03/10/2023 in [facility] at [facility] Video Visit from 09/14/2022 in [facility] Video Visit from 08/04/2022 in [facility]  PHQ-2 Total Score 3 6 1 1 1   PHQ-9 Total Score 14 22 7  -- --      Flowsheet Row Office Visit from 11/04/2023 in [facility] at [facility] Counselor from 10/13/2023 in [facility] at [facility] Video Visit from 09/14/2022 in [facility]  C-SSRS RISK CATEGORY Error: Q3, 4, or 5 should not be populated when Q2 is No Moderate Risk Moderate Risk       Assessment and Plan: as  follows   Bipolar disorder current episode depressed she is on Latuda as she has been stable and regarding to her hypomania or manic episode we will continue Latuda does not report having any side effects.  Considering her depressive part Prozac is causing decreased libido but she has benefited in regarding to depression with Prozac.  We discussed options we will add Wellbutrin a small dose to counter the effect of sexual side effects and also to give an antidepressant effect she agrees to that continue therapy and  regarding to depression  Generalized anxiety; continue Prozac but we will cut down the gabapentin from 4 times a day to 3 times a day considering her tiredness and sleepiness she is on sedating medication eluding trazodone, gabapentin.  Continue Inderal 1 or 2 times a day and monitor pulse if pulse is doing reasonable then can take 1 a day also follow-up with a primary care physician regarding to any health concerns or tachycardia We discussed to have in-house visit next time considering we can get her vitals done but she prefers virtual and opted out for an health visit patient to monitor her pulse and follow-up as needed  ADHD; has been getting Adderall from prior providers which we will continue she was not able to provide the report with the name with ADHD testing was done she appears to be tired withdrawn and as of now we will continue Adderall discussed the risk and also discussed to abstain from any drugs or alcohol  Patient also to abstain from alcohol considering her depression and tiredness and a motivation she states she has stopped 2 weeks ago and understands to get referral sources if needed for support groups  Insomnia; reviewed sleep hygiene apparently she is also sleeping and tired during the daytime so we will cut down the trazodone to 25 mg and then discontinue so that she cannot have sedative effect during the daytime.  We discussed to keep awake and add activities during the day so that she can have sleep debt to use for sleep. Reviewed sleep hygiene  Patient questions addressed medications reviewed follow-up in 3 to 4 weeks continue therapy Call in earlier if needed and 911 or emergency services for worsening of symptoms / suicidal concerns    Collaboration of Care: Psychiatrist [facility] providers chart reviewed   Patient/Guardian was advised Release of Information must be obtained prior to any record release in order to collaborate their care with an outside provider.  Patient/Guardian was advised if they have not already done so to contact the registration department to sign all necessary forms in order for [place] to release information regarding their care.   Consent: Patient/Guardian gives verbal consent for treatment and assignment of benefits for services provided during this visit. Patient/Guardian expressed understanding and agreed to proceed.   Thresa Ross, MD 11/8/202411:28 AM

## 2023-11-07 ENCOUNTER — Telehealth (HOSPITAL_COMMUNITY): Payer: Self-pay | Admitting: *Deleted

## 2023-11-07 DIAGNOSIS — F900 Attention-deficit hyperactivity disorder, predominantly inattentive type: Secondary | ICD-10-CM

## 2023-11-07 MED ORDER — AMPHETAMINE-DEXTROAMPHET ER 20 MG PO CP24: 20.0000 mg | ORAL_CAPSULE | Freq: Every day | ORAL | 0 refills | Status: DC

## 2023-11-07 NOTE — Telephone Encounter (Signed)
Patient Called [id number] - [place], [place] - [address]    IS OUT OF STOCK--  Amphet-Dextroamphet 3-Bead ER 25 MG CP24   ASKED IF SHE COULD BE INCREASED UP TO 40 MG  OR THEN DECREASED TO 20 MG  LAST APPT   11/04/23 NEXT APPT   12/02/23

## 2023-11-07 NOTE — Addendum Note (Signed)
Addended by: Thresa Ross on: 11/07/2023 04:16 PM   Modules accepted: Orders

## 2023-11-18 ENCOUNTER — Other Ambulatory Visit (HOSPITAL_COMMUNITY): Payer: Self-pay | Admitting: Psychiatry

## 2023-11-21 ENCOUNTER — Telehealth (HOSPITAL_COMMUNITY): Payer: Self-pay | Admitting: *Deleted

## 2023-11-21 MED ORDER — BUPROPION HCL ER (SR) 100 MG PO TB12: 100.0000 mg | ORAL_TABLET | Freq: Two times a day (BID) | ORAL | 0 refills | Status: DC

## 2023-11-21 NOTE — Telephone Encounter (Signed)
Patient called stated her medication only disp # 30 which is a 15 day supply & she needs a  #60 day supply due taken 2 x daily  buPROPion ER (WELLBUTRIN SR) 100 MG 12 hr tablet   Take 1 tablet (100 mg total) by mouth 2 (two) times daily **30 tablet   Patient is on holiday  & requested new script be sent to  --  [id number] - [place], [place] - [address]

## 2023-11-21 NOTE — Addendum Note (Signed)
Addended by: Thresa Ross on: 11/21/2023 11:17 AM   Modules accepted: Orders

## 2023-11-28 ENCOUNTER — Ambulatory Visit: Payer: MEDICAID | Admitting: Nurse Practitioner

## 2023-11-30 ENCOUNTER — Encounter: Payer: Self-pay | Admitting: Nurse Practitioner

## 2023-11-30 ENCOUNTER — Ambulatory Visit (HOSPITAL_COMMUNITY): Payer: MEDICAID | Admitting: Licensed Clinical Social Worker

## 2023-11-30 ENCOUNTER — Ambulatory Visit: Payer: MEDICAID | Admitting: Nurse Practitioner

## 2023-11-30 VITALS — BP 116/81 | HR 60 | Temp 97.9°F | Ht 72.0 in | Wt 303.0 lb

## 2023-11-30 DIAGNOSIS — E65 Localized adiposity: Secondary | ICD-10-CM

## 2023-11-30 DIAGNOSIS — Z6841 Body Mass Index (BMI) 40.0 and over, adult: Secondary | ICD-10-CM | POA: Diagnosis not present

## 2023-11-30 NOTE — Progress Notes (Signed)
Office: [phone]  /  Fax: [phone]  WEIGHT SUMMARY AND BIOMETRICS  Weight Lost Since Last Visit: 15lb  Weight Gained Since Last Visit: 0lb   Vitals Temp: 97.9 F (36.6 C) BP: 116/81 Pulse Rate: 60 SpO2: 98 %   Anthropometric Measurements Height: 6' (1.829 m) Weight: (!) 303 lb (137.4 kg) BMI (Calculated): 41.09 Weight at Last Visit: 318lb Weight Lost Since Last Visit: 15lb Weight Gained Since Last Visit: 0lb Starting Weight: 288lb Total Weight Loss (lbs): 0 lb (0 kg)   Body Composition  Body Fat %: 48.9 % Fat Mass (lbs): 148.2 lbs Muscle Mass (lbs): 147.2 lbs Total Body Water (lbs): 101.6 lbs Visceral Fat Rating : 13   Other Clinical Data Fasting: No Labs: No Today's Visit #: 6 Starting Date: 04/06/23     HPI  Chief Complaint: OBESITY  Wanda Torres is here to discuss her progress with her obesity treatment plan. She is on the the Category 4 Plan and states she is following her eating plan approximately 100 % of the time. She states she is exercising 30 minutes 3 days per week.  Interval History:  Since last office visit she has lost 15 pounds.  She is following the plan 100% and doing well.  She especially did well over Thanksgiving and is not worried about gaining weight over Christmas.  She got engaged since her last visit. She is drinking wine, water and hot tea.    She started Wellbutrin SR 100 mg BID after seeing Dr. Gilmore Laroche on 11/04/23. She reports her mood is doing well.   Pharmacotherapy for weight loss: She is not currently taking medications  for medical weight loss.     Previous pharmacotherapy for medical weight loss:  none   Bariatric surgery:  Patient has not had bariatric surgery  Visceral Obesity Improving  PHYSICAL EXAM:  Blood pressure 116/81, pulse 60, temperature 97.9 F (36.6 C), height 6' (1.829 m), weight (!) 303 lb (137.4 kg), SpO2 98%. Body mass index is 41.09 kg/m.  General: She is overweight, cooperative, alert,  well developed, and in no acute distress. PSYCH: Has normal mood, affect and thought process.   Extremities: No edema.  Neurologic: No gross sensory or motor deficits. No tremors or fasciculations noted.    DIAGNOSTIC DATA REVIEWED:  BMET    Component Value Date/Time   NA 139 03/10/2023 1438   K 4.2 03/10/2023 1438   CL 101 03/10/2023 1438   CO2 22 03/10/2023 1438   GLUCOSE 88 03/10/2023 1438   GLUCOSE 63 (L) 09/15/2020 2229   BUN 15 03/10/2023 1438   CREATININE 0.80 03/10/2023 1438   CALCIUM 9.5 03/10/2023 1438   GFRNONAA >60 09/15/2020 2229   GFRAA >60 09/15/2020 2229   Lab Results  Component Value Date   HGBA1C 4.8 03/10/2023   HGBA1C 4.4 (L) 07/09/2020   Lab Results  Component Value Date   INSULIN 6.0 04/06/2023   Lab Results  Component Value Date   TSH 3.450 03/10/2023   CBC    Component Value Date/Time   WBC 5.3 03/10/2023 1438   WBC 7.2 09/15/2020 2229   RBC 4.49 03/10/2023 1438   RBC 4.78 09/15/2020 2229   HGB 15.0 03/10/2023 1438   HCT 43.9 03/10/2023 1438   PLT 254 03/10/2023 1438   MCV 98 (H) 03/10/2023 1438   MCH 33.4 (H) 03/10/2023 1438   MCH 33.7 09/15/2020 2229   MCHC 34.2 03/10/2023 1438   MCHC 35.8 09/15/2020 2229   RDW 13.8 03/10/2023 1438  Iron Studies    Component Value Date/Time   IRON 79 05/01/2018 1740   Lipid Panel     Component Value Date/Time   CHOL 166 09/19/2023 1017   TRIG 97 09/19/2023 1017   HDL 59 09/19/2023 1017   CHOLHDL 2.8 09/19/2023 1017   CHOLHDL 2.2 07/09/2020 1745   VLDL 29 07/09/2020 1745   LDLCALC 89 09/19/2023 1017   Hepatic Function Panel     Component Value Date/Time   PROT 6.9 03/10/2023 1438   ALBUMIN 4.5 03/10/2023 1438   AST 26 03/10/2023 1438   ALT 29 03/10/2023 1438   ALKPHOS 68 03/10/2023 1438   BILITOT 0.5 03/10/2023 1438   BILIDIR 0.1 07/09/2020 1745   IBILI 0.6 07/09/2020 1745      Component Value Date/Time   TSH 3.450 03/10/2023 1438   Nutritional Lab Results  Component Value  Date   VD25OH 41.3 03/10/2023   VD25OH 37.1 05/01/2018     ASSESSMENT AND PLAN  TREATMENT PLAN FOR OBESITY:  Recommended Dietary Goals  Wanda Torres is currently in the action stage of change. As such, her goal is to continue weight management plan. She has agreed to the Category 4 Plan.  Behavioral Intervention  We discussed the following Behavioral Modification Strategies today: increasing lean protein intake to established goals, increasing water intake , work on meal planning and preparation, reading food labels , keeping healthy foods at home, continue to practice mindfulness when eating, planning for success, better snacking choices, and continue to work on maintaining a reduced calorie state, getting the recommended amount of protein, incorporating whole foods, making healthy choices, staying well hydrated and practicing mindfulness when eating..  Additional resources provided today: NA  Recommended Physical Activity Goals  Wanda Torres has been advised to work up to 150 minutes of moderate intensity aerobic activity a week and strengthening exercises 2-3 times per week for cardiovascular health, weight loss maintenance and preservation of muscle mass.   She has agreed to Think about enjoyable ways to increase daily physical activity and overcoming barriers to exercise, Increase physical activity in their day and reduce sedentary time (increase NEAT)., Increase the intensity, frequency or duration of strengthening exercises , and Increase the intensity, frequency or duration of aerobic exercises      ASSOCIATED CONDITIONS ADDRESSED TODAY  Action/Plan  Visceral obesity Improving.  Will continue to monitor.    Morbid obesity (HCC)  BMI 40.0-44.9, adult (HCC)         Return in about 4 weeks (around 12/28/2023).Marland Kitchen She was informed of the importance of frequent follow up visits to maximize her success with intensive lifestyle modifications for her multiple health  conditions.   ATTESTASTION STATEMENTS:  Reviewed by clinician on day of visit: allergies, medications, problem list, medical history, surgical history, family history, social history, and previous encounter notes.   Time spent on visit including pre-visit chart review and post-visit care and charting was 30 minutes.    Theodis Sato. Paraskevi Funez FNP-C

## 2023-12-02 ENCOUNTER — Encounter (HOSPITAL_COMMUNITY): Payer: Self-pay | Admitting: Psychiatry

## 2023-12-02 ENCOUNTER — Telehealth (HOSPITAL_COMMUNITY): Payer: Medicaid Other | Admitting: Psychiatry

## 2023-12-02 DIAGNOSIS — G4700 Insomnia, unspecified: Secondary | ICD-10-CM

## 2023-12-02 DIAGNOSIS — F314 Bipolar disorder, current episode depressed, severe, without psychotic features: Secondary | ICD-10-CM | POA: Diagnosis not present

## 2023-12-02 DIAGNOSIS — R000 Tachycardia, unspecified: Secondary | ICD-10-CM | POA: Diagnosis not present

## 2023-12-02 DIAGNOSIS — F411 Generalized anxiety disorder: Secondary | ICD-10-CM

## 2023-12-02 MED ORDER — PROPRANOLOL HCL 10 MG PO TABS: 10.0000 mg | ORAL_TABLET | Freq: Every day | ORAL | 1 refills | Status: DC

## 2023-12-02 MED ORDER — GABAPENTIN 100 MG PO CAPS: 100.0000 mg | ORAL_CAPSULE | Freq: Three times a day (TID) | ORAL | 1 refills | Status: DC

## 2023-12-02 MED ORDER — AMPHETAMINE-DEXTROAMPHET ER 20 MG PO CP24: 20.0000 mg | ORAL_CAPSULE | Freq: Every day | ORAL | 0 refills | Status: DC

## 2023-12-02 NOTE — Progress Notes (Signed)
[facility] Follow up visit  Patient Identification: Wanda Torres MRN:  [id number] Date of Evaluation:  12/02/2023 Referral Source: [facility] . Dr. Hazle Quant Chief Complaint:   No chief complaint on file. Follow up depression  Visit Diagnosis:    ICD-10-CM   1. Generalized anxiety disorder  F41.1 gabapentin (NEURONTIN) 100 MG capsule    2. Tachycardia  R00.0 propranolol (INDERAL) 10 MG tablet    Virtual Visit via Video Note  I connected with Wanda Torres on 12/02/23 at 11:30 AM EST by a video enabled telemedicine application and verified that I am speaking with the correct person using two identifiers.  Location: Patient: home Provider: home office   I discussed the limitations of evaluation and management by telemedicine and the availability of in person appointments. The patient expressed understanding and agreed to proceed.     I discussed the assessment and treatment plan with the patient. The patient was provided an opportunity to ask questions and all were answered. The patient agreed with the plan and demonstrated an understanding of the instructions.   The patient was advised to call back or seek an in-person evaluation if the symptoms worsen or if the condition fails to improve as anticipated.  I provided 20 minutes of non-face-to-face time during this encounter.    History of Present Illness: Patient is a 32 years old white female currently living with her fianc initially referred by [facility] as she is a resident of [facility] in [place] so transferred from [facility] patient.  Diagnosed with bipolar, ADD, anxiety.  Patient currently living with her fianc she has 3 kids were not living with her and 2 of the kids are with her ex and 1 kid is with her sister she is trying to get their custody kids were taken prior to hospital admission in 2021 according to her when she had an episode of depression with suicidality.  Patient gives a long history of having episodes  of depression and also episodes of seemingly mania or hypomania   Last visit was withdrawn also had decreased libido, wanted to change prozac  Was also on latuda Wellbutrin was started  Feels much better, motivated depression improved and handling custody stress better Libido better and feels attention has improved as well she is also on adderall 20mg   Main worries are related to kids and custody but can visit them  Aggravating factors; kids custody concerns. Modifying factors; her kids, fianc, her animals  Duration more than 10 years Severity improved   Past Psychiatric History: Bipolar, anxiety, depression  Previous Psychotropic Medications: Yes  Wellbutrin, cymbalta, zoloft . Says didn't work  Substance Abuse History in the last 12 months:  Yes.    Consequences of Substance Abuse: Has been drinking till last 2 weeks, discussed abstinence and alcohol effect to depression, judjement  Past Medical History:  Past Medical History:  Diagnosis Date   Acne    ADD (attention deficit disorder)    Alcohol abuse    Allergy    Anemia    Anxiety    ASCUS with positive high risk human papillomavirus of vagina 05/2022   Bipolar 1 disorder (HCC)    Depression    Depression    Drug use    Hypothyroidism    Joint pain    PCOS (polycystic ovarian syndrome)    Psoriasis    Supervision of other high risk pregnancy, antepartum 01/19/2019   Formatting of this note might be different from the original. Partner prefers She/Her pronouns  Swelling of lower extremity    Vitamin B 12 deficiency    Vitamin D deficiency     Past Surgical History:  Procedure Laterality Date   COLPOSCOPY  07/2022   ENDOCERVICAL MUCOSA WITH CHRONIC CERVICITIS.   NO SQUAMOUS MUCOSA IDENTIFIED.   NO EVIDENCE OF DYSPLASIA OR MALIGNANCY.    Family Psychiatric History: Aunt : bipolar, Mom; depression  Family History:  Family History  Problem Relation Age of Onset   Anxiety disorder Mother    Depression  Mother    Sexual abuse Mother    Hypothyroidism Mother    High blood pressure Mother    High Cholesterol Mother    Drug abuse Father    ADD / ADHD Sister    Anxiety disorder Sister    Bipolar disorder Sister    Depression Sister    Drug abuse Sister    Sexual abuse Sister    Anxiety disorder Brother    Depression Brother    Sexual abuse Brother    Thyroid disease Maternal Grandmother        THyroid cancer   Depression Maternal Grandmother    Sexual abuse Maternal Grandmother    Alcohol abuse Maternal Grandfather    Dementia Maternal Grandfather    Depression Maternal Grandfather    Sexual abuse Maternal Grandfather    Alcohol abuse Maternal Aunt    Bipolar disorder Maternal Aunt    Depression Maternal Aunt    Sexual abuse Maternal Aunt    Bipolar disorder Maternal Aunt    Depression Maternal Aunt    Sexual abuse Maternal Aunt    ADD / ADHD Maternal Uncle    Alcohol abuse Maternal Uncle    Drug abuse Maternal Uncle    Sexual abuse Maternal Uncle     Social History:   Social History   Socioeconomic History   Marital status: Divorced    Spouse name: Not on file   Number of children: 4   Years of education: Not on file   Highest education level: Some college, no degree  Occupational History    Comment: Supervisor reservations; 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[employer]  Tobacco Use   Smoking status: Never   Smokeless tobacco: Never   Tobacco comments:    Vaps daily   Vaping Use   Vaping status: Every Day   Substances: Nicotine  Substance and Sexual Activity   Alcohol use: Yes    Alcohol/week: 1.0 standard drink of alcohol    Types: 1 Standard drinks or equivalent per week    Comment: reports one a week   Drug use: Never   Sexual activity: Not on file  Other Topics Concern   Not on file  Social History Narrative   Not on file   Social Determinants of Health   Financial Resource Strain: Low Risk  (03/23/2023)   Overall Financial Resource Strain (CARDIA)     Difficulty of Paying Living Expenses: Not hard at all  Food Insecurity: Food Insecurity Present (03/23/2023)   Hunger Vital Sign    Worried About Running Out of Food in the Last Year: Sometimes true    Ran Out of Food in the Last Year: Never true  Transportation Needs: No Transportation Needs (03/23/2023)   PRAPARE - Administrator, Civil Service (Medical): No    Lack of Transportation (Non-Medical): No  Physical Activity: Insufficiently Active (03/23/2023)   Exercise Vital Sign    Days of Exercise per Week: 3 days    Minutes of Exercise per Session:  30 min  Stress: Stress Concern Present (03/23/2023)   [employer] of Occupational Health - Occupational Stress Questionnaire    Feeling of Stress : To some extent  Social Connections: Moderately Isolated (03/23/2023)   Social Connection and Isolation Panel [NHANES]    Frequency of Communication with Friends and Family: More than three times a week    Frequency of Social Gatherings with Friends and Family: Once a week    Attends Religious Services: Never    Database administrator or Organizations: No    Attends Engineer, structural: Not on file    Marital Status: Living with partner    Additional Social History: grew up with mom and sister, some challenges, denies abuse Has 3 kids and filing for custody  Allergies:  No Known Allergies  Metabolic Disorder Labs: Lab Results  Component Value Date   HGBA1C 4.8 03/10/2023   MPG 79.58 07/09/2020   No results found for: "PROLACTIN" Lab Results  Component Value Date   CHOL 166 09/19/2023   TRIG 97 09/19/2023   HDL 59 09/19/2023   CHOLHDL 2.8 09/19/2023   VLDL 29 07/09/2020   LDLCALC 89 09/19/2023   LDLCALC Comment (A) 04/06/2023   Lab Results  Component Value Date   TSH 3.450 03/10/2023    Therapeutic Level Labs: No results found for: "LITHIUM" No results found for: "CBMZ" No results found for: "VALPROATE"  Current Medications: Current Outpatient  Medications  Medication Sig Dispense Refill   Amphet-Dextroamphet 3-Bead ER 25 MG CP24 Take 1 capsule (25 mg total) by mouth daily. 30 capsule 0   amphetamine-dextroamphetamine (ADDERALL XR) 20 MG 24 hr capsule Take 1 capsule (20 mg total) by mouth daily. 30 capsule 0   buPROPion ER (WELLBUTRIN SR) 100 MG 12 hr tablet Take 1 tablet (100 mg total) by mouth 2 (two) times daily. 60 tablet 0   Cetirizine HCl (ZYRTEC ALLERGY PO) Take by mouth.     Cholecalciferol (VITAMIN D-3 PO) Take by mouth.     Cyanocobalamin (VITAMIN B-12 PO) Take by mouth.     fenofibrate (TRICOR) 145 MG tablet Take 1 tablet (145 mg total) by mouth daily. 90 tablet 1   gabapentin (NEURONTIN) 100 MG capsule Take 1 capsule (100 mg total) by mouth 3 (three) times daily. 90 capsule 1   Lurasidone HCl (LATUDA) 120 MG TABS Take 1 tablet (120 mg total) by mouth every evening. Take with food 30 tablet 1   metFORMIN (GLUCOPHAGE) 500 MG tablet Take 1 tablet (500 mg total) by mouth daily with breakfast. TAKE 1 TABLET(500 MG) BY MOUTH DAILY WITH BREAKFAST 90 tablet 0   propranolol (INDERAL) 10 MG tablet Take 1 tablet (10 mg total) by mouth daily. 30 tablet 1   No current facility-administered medications for this visit.     Psychiatric Specialty Exam: Review of Systems  Cardiovascular:  Negative for chest pain.  Neurological:  Negative for tremors.    There were no vitals taken for this visit.There is no height or weight on file to calculate BMI.  General Appearance: Casual  Eye Contact:  Fair  Speech:  Slow  Volume:  Decreased  Mood:  better  Affect:  Congruent  Thought Process:  Goal Directed  Orientation:  Full (Time, Place, and Person)  Thought Content:  Rumination  Suicidal Thoughts:  No  Homicidal Thoughts:  No  Memory:  Immediate;   Fair  Judgement:  Fair  Insight:  Shallow  Psychomotor Activity:  Decreased  Concentration:  Concentration:  Fair  Recall:  Jennelle Human of Knowledge:Fair  Language: Fair  Akathisia:   No  Handed:    AIMS (if indicated):  no involuntary movements   Assets:  Desire for Improvement  ADL's:  Intact  Cognition: WNL  Sleep:  Fair   Screenings: AIMS    Flowsheet Row [facility] (Discharged[facility]  AIMS Total Score 0      AUDIT    Flowsheet Row Appointment from 03/24/2023 in [facility] Ad[facility] (Discharged) [facility] Admission (Discharged) from [facility]  Alcohol Use Disorder Identification Test Final Score (AUDIT) 5 2 3       GAD-7    Flowsheet Row Office Visit from 03/10/2023 in [facility] Video Visit from 09/14/2022 in [facility] Video Visit from 08/04/2022 in [facility] Video Visit from 06/16/2022 in [facility] Video Visit from 04/14/2022 in [facility]  Total GAD-7 Score 6 5 4 1 2       PHQ2-9    Flowsheet Row Office Visit from 11/04/2023 in [facility] Counselor from 10/13/2023 in [facility] Office Visit from 03/10/2023 in [facility] Video Visit from 09/14/2022 in [facility] Video Visit from 08/04/2022 in [facility]  PHQ-2 Total Score 3 6 1 1 1   PHQ-9 Total Score 14 22 7  -- --      Flowsheet Row Office Visit from 11/04/2023 in [facility] at [facility] Counselor from 10/13/2023 in [facility] Video Visit from 09/14/2022 in [facility]  C-SSRS RISK CATEGORY Error: Q3, 4, or 5 should not be  populated when Q2 is No Moderate Risk Moderate Risk       Assessment and Plan: as follows   Prior documentation reviewed   Bipolar disorder current episode depressed : better and less depressed, she has stopped prozac and now on wellbutrin has hellped, continue latuda   Generalized anxiety; some better, conitnue gabapentin, now not on prozac  Consider therapy and denies palpitations   ADHD; improved , is on adderall 20mg , wellbutrin was started last has helped    Insomnia; better, and working on sleep hygiene, not on trazadone it was tapered off  Fu 25m. Renewed meds which were due   Collaboration of Care: Psychiatrist [facility] providers chart reviewed   Patient/Guardian was advised Release of Information must be obtained prior to any record release in order to collaborate their care with an outside provider. Patient/Guardian was advised if they have not already done so to contact the registration department to sign all necessary forms in order for [place] to release information regarding their care.   Consent: Patient/Guardian gives verbal consent for treatment and assignment of benefits for services provided during this visit. Patient/Guardian expressed understanding and agreed to proceed.   Thresa Ross, MD 12/6/202411:38 AM

## 2023-12-08 ENCOUNTER — Ambulatory Visit (HOSPITAL_COMMUNITY): Payer: Medicaid Other | Admitting: Licensed Clinical Social Worker

## 2023-12-08 DIAGNOSIS — F900 Attention-deficit hyperactivity disorder, predominantly inattentive type: Secondary | ICD-10-CM

## 2023-12-08 DIAGNOSIS — F314 Bipolar disorder, current episode depressed, severe, without psychotic features: Secondary | ICD-10-CM | POA: Diagnosis not present

## 2023-12-08 DIAGNOSIS — F411 Generalized anxiety disorder: Secondary | ICD-10-CM

## 2023-12-08 NOTE — Progress Notes (Signed)
Virtual Visit via Video Note  I connected with [name] on 12/08/23 at  1:00 PM EST by a video enabled telemedicine application and verified that I am speaking with the correct person using two identifiers.  Location: Patient: home Provider: home office   I discussed the limitations of evaluation and management by telemedicine and the availability of in person appointments. The patient expressed understanding and agreed to proceed.   I discussed the assessment and treatment plan with the patient. The patient was provided an opportunity to ask questions and all were answered. The patient agreed with the plan and demonstrated an understanding of the instructions.   The patient was advised to call back or seek an in-person evaluation if the symptoms worsen or if the condition fails to improve as anticipated.  I provided 47 minutes of non-face-to-face time during this encounter.  THERAPIST PROGRESS NOTE  Session Time: 1:00 PM to 1:47 PM  Participation Level: Active  Behavioral Response: CasualAlertappropriate  Type of Therapy: Individual Therapy  Treatment Goals addressed: Work on decreasing depression, effective coping skills work on symptoms of trauma  ProgressTowards Goals: Progressing-has really improved on medication worked on issue emotional regulation which is main issue right now following a step-by-step for her to begin to slow down spiraling  Interventions: CBT, Solution Focused, Strength-based, Supportive, and Other: Emotional regulation  Summary: NORMANDY ZITTEL is a 32 y.o. female who presents with doing good. New psychiatrist he put her on Wellbutrin has been a Secretary/administrator. More energy, wake up at normal time not over sleeping, stay up later up at night not going to bed 7-8 not so tired. Extra time with fiance, lost 15 lbs the med curb her appetite. Having to be more mindful because not even hungry. Her depression is chemical she is bipolar is part of it. The  depressive side has been the most difficult to deal with doesn't have manic episodes anymore the meds have helped with that. The main thing struggling with is emotional regulation. Everything different doesn't have to make herself brush her teeth get out of bed not those problems problems with emotional regulation not depressed but still bipolar one end of the other. Really good or not good have a hard time getting back to being ok. Not problem doing stuff during the day. .  Quickly spiral slowly down. Recognize but goes into a spiral how to stop process end up crying.  Practice notice be aware, then pausing then naming emotions and then exploring with questions. Bring other emotional regulation skills in such as challenging thought, may need to go to strategy in the moment to help her calm down.     Reviewed symptoms reviewed recent events noted mood is better medications are a Secretary/administrator for patient.  Now she is feeling better has more motivation to work on coping.  She identified specifically needing to work on emotional regulation that she spirals quickly.  Therapist explained coming at this from different angles.  We looked at thoughts how they affect how we feel they are hard to notice so practice when she is upset noticing what she is telling her self is a skill to work on.  We looked at it from another angle of another important piece is slowing down her reaction pausing to get out of emotional brain we came up with a strategy for patient which is first awareness naming the emotion then exploratory questions such as why my feeling this now along with what and when  feeling.  The pause will help her to come up with a more effective reaction.  Noted using mindfulness working on this to be better at noticing.  Therapist added she may need to go to strategy to de-activate her from spiraling.  Patient will look at article on emotional regulation therapist also showed her different channels we can intervene  we can track the situation, emotion, thoughts behaviors and we can intervene in any of these channels to regulate her emotions. Suicidal/Homicidal: No  Plan: Return again in 6-7 weeks.(On waiting list) 2.  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 Patient/Guardian was advised if they have not already done so to contact the registration department to sign all necessary forms in order for [place] to release information regarding their care.   Consent: Patient/Guardian gives verbal consent for treatment and assignment of benefits for services provided during this visit. Patient/Guardian expressed understanding and agreed to proceed.   Coolidge Breeze, LCSW 12/08/2023

## 2023-12-27 ENCOUNTER — Other Ambulatory Visit (HOSPITAL_COMMUNITY): Payer: Self-pay | Admitting: Psychiatry

## 2023-12-27 ENCOUNTER — Ambulatory Visit: Payer: MEDICAID | Admitting: Nurse Practitioner

## 2023-12-27 ENCOUNTER — Encounter: Payer: Self-pay | Admitting: Nurse Practitioner

## 2023-12-27 VITALS — BP 130/86 | HR 87 | Temp 98.2°F | Ht 72.0 in | Wt 293.0 lb

## 2023-12-27 DIAGNOSIS — Z6839 Body mass index (BMI) 39.0-39.9, adult: Secondary | ICD-10-CM

## 2023-12-27 DIAGNOSIS — E65 Localized adiposity: Secondary | ICD-10-CM

## 2023-12-27 NOTE — Addendum Note (Signed)
Addended by: Irene Limbo on: 12/27/2023 12:31 PM   Modules accepted: Level of Service

## 2023-12-27 NOTE — Progress Notes (Signed)
 Office: [phone]  /  Fax: [phone]  WEIGHT SUMMARY AND BIOMETRICS  Weight Lost Since Last Visit: 10lb  Weight Gained Since Last Visit: 0lb   Vitals Temp: 98.2 F (36.8 C) BP: 130/86 Pulse Rate: 87 SpO2: 98 %   Anthropometric Measurements Height: 6' (1.829 m) Weight: 293 lb (132.9 kg) BMI (Calculated): 39.73 Weight at Last Visit: 303lb Weight Lost Since Last Visit: 10lb Weight Gained Since Last Visit: 0lb Starting Weight: 288lb Total Weight Loss (lbs): 0 lb (0 kg)   Body Composition  Body Fat %: 47.7 % Fat Mass (lbs): 140 lbs Muscle Mass (lbs): 145.6 lbs Total Body Water (lbs): 97.8 lbs Visceral Fat Rating : 12   Other Clinical Data Fasting: No Labs: No Today's Visit #: 7 Starting Date: 04/06/23     HPI  Chief Complaint: OBESITY  Wanda Torres is here to discuss her progress with her obesity treatment plan. She is on the the Category 4 Plan and states she is following her eating plan approximately 30 % of the time. She states she is exercising 0 minutes 0 days per week.   Interval History:  Since last office visit she has lost 10 pounds. She eats one meal and 1-2 snacks per day.  Denies polyphagia and cravings. She is drinking wine, water and hot tea. She plans to start back exercising.    Pharmacotherapy for weight loss: She is not currently taking medications  for medical weight loss.     Previous pharmacotherapy for medical weight loss:  none   Bariatric surgery:  Patient has not had bariatric surgery  Visceral obesity Continuing to improve.  Since 11/03/23 she has decreased from 14 to 12  PHYSICAL EXAM:  Blood pressure 130/86, pulse 87, temperature 98.2 F (36.8 C), height 6' (1.829 m), weight 293 lb (132.9 kg), SpO2 98%. Body mass index is 39.74 kg/m.  General: She is overweight, cooperative, alert, well developed, and in no acute distress. PSYCH: Has normal mood, affect and thought process.   Extremities: No edema.  Neurologic: No  gross sensory or motor deficits. No tremors or fasciculations noted.    DIAGNOSTIC DATA REVIEWED:  BMET    Component Value Date/Time   NA 139 03/10/2023 1438   K 4.2 03/10/2023 1438   CL 101 03/10/2023 1438   CO2 22 03/10/2023 1438   GLUCOSE 88 03/10/2023 1438   GLUCOSE 63 (L) 09/15/2020 2229   BUN 15 03/10/2023 1438   CREATININE 0.80 03/10/2023 1438   CALCIUM 9.5 03/10/2023 1438   GFRNONAA >60 09/15/2020 2229   GFRAA >60 09/15/2020 2229   Lab Results  Component Value Date   HGBA1C 4.8 03/10/2023   HGBA1C 4.4 (L) 07/09/2020   Lab Results  Component Value Date   INSULIN  6.0 04/06/2023   Lab Results  Component Value Date   TSH 3.450 03/10/2023   CBC    Component Value Date/Time   WBC 5.3 03/10/2023 1438   WBC 7.2 09/15/2020 2229   RBC 4.49 03/10/2023 1438   RBC 4.78 09/15/2020 2229   HGB 15.0 03/10/2023 1438   HCT 43.9 03/10/2023 1438   PLT 254 03/10/2023 1438   MCV 98 (H) 03/10/2023 1438   MCH 33.4 (H) 03/10/2023 1438   MCH 33.7 09/15/2020 2229   MCHC 34.2 03/10/2023 1438   MCHC 35.8 09/15/2020 2229   RDW 13.8 03/10/2023 1438   Iron Studies    Component Value Date/Time   IRON 79 05/01/2018 1740   Lipid Panel     Component  Value Date/Time   CHOL 166 09/19/2023 1017   TRIG 97 09/19/2023 1017   HDL 59 09/19/2023 1017   CHOLHDL 2.8 09/19/2023 1017   CHOLHDL 2.2 07/09/2020 1745   VLDL 29 07/09/2020 1745   LDLCALC 89 09/19/2023 1017   Hepatic Function Panel     Component Value Date/Time   PROT 6.9 03/10/2023 1438   ALBUMIN 4.5 03/10/2023 1438   AST 26 03/10/2023 1438   ALT 29 03/10/2023 1438   ALKPHOS 68 03/10/2023 1438   BILITOT 0.5 03/10/2023 1438   BILIDIR 0.1 07/09/2020 1745   IBILI 0.6 07/09/2020 1745      Component Value Date/Time   TSH 3.450 03/10/2023 1438   Nutritional Lab Results  Component Value Date   VD25OH 41.3 03/10/2023   VD25OH 37.1 05/01/2018     ASSESSMENT AND PLAN  TREATMENT PLAN FOR OBESITY:  Recommended  Dietary Goals  Wanda Torres is currently in the action stage of change. As such, her goal is to continue weight management plan. She has agreed to the Category 4 Plan.  I have discussed the importance with her today of making sure that she is eating around 1800 cal and 100 g of protein.  We discussed the importance of protein and caloric intake and maintaining muscle mass.  Behavioral Intervention  We discussed the following Behavioral Modification Strategies today: increasing lean protein intake to established goals, increasing vegetables, increasing fiber rich foods, increasing water intake , work on meal planning and preparation, reading food labels , keeping healthy foods at home, and continue to work on maintaining a reduced calorie state, getting the recommended amount of protein, incorporating whole foods, making healthy choices, staying well hydrated and practicing mindfulness when eating..  Additional resources provided today: NA  Recommended Physical Activity Goals  Wanda Torres has been advised to work up to 150 minutes of moderate intensity aerobic activity a week and strengthening exercises 2-3 times per week for cardiovascular health, weight loss maintenance and preservation of muscle mass.   She has agreed to Think about enjoyable ways to increase daily physical activity and overcoming barriers to exercise, Increase physical activity in their day and reduce sedentary time (increase NEAT)., and Work on scheduling and tracking physical activity.   ASSOCIATED CONDITIONS ADDRESSED TODAY  Action/Plan  Visceral obesity Improving. Will continue to monitor   Morbid obesity (HCC)  BMI 39.0-39.9,adult     Will obtain labs at next visit.  Patient is not fasting today.      Return in about 4 weeks (around 01/24/2024).Wanda Torres She was informed of the importance of frequent follow up visits to maximize her success with intensive lifestyle modifications for her multiple health  conditions.   ATTESTASTION STATEMENTS:  Reviewed by clinician on day of visit: allergies, medications, problem list, medical history, surgical history, family history, social history, and previous encounter notes.   Time spent on visit including pre-visit chart review and post-visit care and charting was 20 minutes.    Wanda Torres. Henok Heacock FNP-C

## 2023-12-29 ENCOUNTER — Telehealth (HOSPITAL_COMMUNITY): Payer: Self-pay | Admitting: Psychiatry

## 2023-12-29 DIAGNOSIS — F314 Bipolar disorder, current episode depressed, severe, without psychotic features: Secondary | ICD-10-CM

## 2023-12-29 MED ORDER — BUPROPION HCL ER (SR) 100 MG PO TB12: 100.0000 mg | ORAL_TABLET | Freq: Two times a day (BID) | ORAL | 0 refills | Status: DC

## 2023-12-29 NOTE — Telephone Encounter (Signed)
 Patient called requesting refill of:  buPROPion  ER (WELLBUTRIN  SR) 100 MG 12 hr tablet   [id number] - [place], [place] - [address] AT [facility] OF [address] Phone: [phone]  Fax: [phone]     Last ordered: 11/21/2023 - 60 tablets  Last visit: 12/02/2023  Next visit: 02/10/2024

## 2023-12-29 NOTE — Telephone Encounter (Signed)
 I have sent Wellbutrin to pharmacy as requested for 30 days.

## 2024-01-05 ENCOUNTER — Telehealth (HOSPITAL_COMMUNITY): Payer: Self-pay | Admitting: *Deleted

## 2024-01-05 MED ORDER — AMPHETAMINE-DEXTROAMPHET ER 20 MG PO CP24: 20.0000 mg | ORAL_CAPSULE | Freq: Every day | ORAL | 0 refills | Status: DC

## 2024-01-05 NOTE — Addendum Note (Signed)
 Addended by: Thresa Ross on: 01/05/2024 12:51 PM   Modules accepted: Orders

## 2024-01-05 NOTE — Telephone Encounter (Signed)
 Refill Request [id number] - [place], [place] - [address] AT [address] OF [address]   amphetamine-dextroamphetamine (ADDERALL XR) 20 MG 24 hr capsule   NEXT APPT 02/10/24 LAST APPT 12/02/23

## 2024-01-23 ENCOUNTER — Other Ambulatory Visit (HOSPITAL_COMMUNITY): Payer: Self-pay | Admitting: Psychiatry

## 2024-01-23 DIAGNOSIS — F314 Bipolar disorder, current episode depressed, severe, without psychotic features: Secondary | ICD-10-CM

## 2024-01-26 ENCOUNTER — Encounter (HOSPITAL_COMMUNITY): Payer: Self-pay | Admitting: Psychiatry

## 2024-01-26 ENCOUNTER — Telehealth (HOSPITAL_COMMUNITY): Payer: MEDICAID | Admitting: Psychiatry

## 2024-01-26 DIAGNOSIS — F411 Generalized anxiety disorder: Secondary | ICD-10-CM

## 2024-01-26 DIAGNOSIS — F314 Bipolar disorder, current episode depressed, severe, without psychotic features: Secondary | ICD-10-CM | POA: Diagnosis not present

## 2024-01-26 DIAGNOSIS — G4700 Insomnia, unspecified: Secondary | ICD-10-CM

## 2024-01-26 NOTE — Progress Notes (Signed)
[facility] Follow up visit  Patient Identification: Wanda Torres MRN:  [id number] Date of Evaluation:  01/26/2024 Referral Source: [facility] . Dr. Hazle Quant Chief Complaint:   No chief complaint on file. Follow up depression  Visit Diagnosis:    ICD-10-CM   1. Bipolar disorder, current episode depressed, severe, without psychotic features (HCC)  F31.4     2. Generalized anxiety disorder  F41.1     3. Insomnia, unspecified type  G47.00     Virtual Visit via Video Note  I connected with Wanda Torres on 01/26/24 at 12:00 PM EST by a video enabled telemedicine application and verified that I am speaking with the correct person using two identifiers.  Location: Patient: home Provider: home office   I discussed the limitations of evaluation and management by telemedicine and the availability of in person appointments. The patient expressed understanding and agreed to proceed.      I discussed the assessment and treatment plan with the patient. The patient was provided an opportunity to ask questions and all were answered. The patient agreed with the plan and demonstrated an understanding of the instructions.   The patient was advised to call back or seek an in-person evaluation if the symptoms worsen or if the condition fails to improve as anticipated.  I provided 20 minutes of non-face-to-face time during this encounter.     History of Present Illness: Patient is a 33 years old white female currently living with her fianc initially referred by [facility] as she is a resident of [facility] in [place] so transferred from [facility] patient.  Diagnosed with bipolar, ADD, anxiety.  Patient currently living with her fianc she has 3 kids were not living with her and 2 of the kids are with her ex and 1 kid is with her sister she is trying to get their custody kids were taken prior to hospital admission in 2021 according to her when she had an episode of depression with  suicidality. Last visit was doing fair on not being on prozac and was on wellbutrin. Also on latuda Changed prozac due to libido concerns Also on adderall  She is now noticing for last more then a week of excessive sleepiness  during the day despite night sleep, somewhat withdrawn and decreased motivation    Main worries are related to kids and custody but can visit them  Aggravating factors; kids, custody concern Modifying factors; her kids, fianc,  her animals  Duration more than 10 years Severity amtovation, sleepiness   Past Psychiatric History: Bipolar, anxiety, depression  Previous Psychotropic Medications: Yes  Wellbutrin, cymbalta, zoloft . Says didn't work  Substance Abuse History in the last 12 months:  Yes.    Consequences of Substance Abuse: Has been drinking till last 2 weeks, discussed abstinence and alcohol effect to depression, judjement  Past Medical History:  Past Medical History:  Diagnosis Date   Acne    ADD (attention deficit disorder)    Alcohol abuse    Allergy    Anemia    Anxiety    ASCUS with positive high risk human papillomavirus of vagina 05/2022   Bipolar 1 disorder (HCC)    Depression    Depression    Drug use    Hypothyroidism    Joint pain    PCOS (polycystic ovarian syndrome)    Psoriasis    Supervision of other high risk pregnancy, antepartum 01/19/2019   Formatting of this note might be different from the original. Partner prefers She/Her  pronouns   Swelling of lower extremity    Vitamin B 12 deficiency    Vitamin D deficiency     Past Surgical History:  Procedure Laterality Date   COLPOSCOPY  07/2022   ENDOCERVICAL MUCOSA WITH CHRONIC CERVICITIS.   NO SQUAMOUS MUCOSA IDENTIFIED.   NO EVIDENCE OF DYSPLASIA OR MALIGNANCY.    Family Psychiatric History: Aunt : bipolar, Mom; depression  Family History:  Family History  Problem Relation Age of Onset   Anxiety disorder Mother    Depression Mother    Sexual abuse Mother     Hypothyroidism Mother    High blood pressure Mother    High Cholesterol Mother    Drug abuse Father    ADD / ADHD Sister    Anxiety disorder Sister    Bipolar disorder Sister    Depression Sister    Drug abuse Sister    Sexual abuse Sister    Anxiety disorder Brother    Depression Brother    Sexual abuse Brother    Thyroid disease Maternal Grandmother        THyroid cancer   Depression Maternal Grandmother    Sexual abuse Maternal Grandmother    Alcohol abuse Maternal Grandfather    Dementia Maternal Grandfather    Depression Maternal Grandfather    Sexual abuse Maternal Grandfather    Alcohol abuse Maternal Aunt    Bipolar disorder Maternal Aunt    Depression Maternal Aunt    Sexual abuse Maternal Aunt    Bipolar disorder Maternal Aunt    Depression Maternal Aunt    Sexual abuse Maternal Aunt    ADD / ADHD Maternal Uncle    Alcohol abuse Maternal Uncle    Drug abuse Maternal Uncle    Sexual abuse Maternal Uncle     Social History:   Social History   Socioeconomic History   Marital status: Divorced    Spouse name: Not on file   Number of children: 4   Years of education: Not on file   Highest education level: Some college, no degree  Occupational History    Comment: Supervisor reservations; 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[employer]  Tobacco Use   Smoking status: Never   Smokeless tobacco: Never   Tobacco comments:    Vaps daily   Vaping Use   Vaping status: Every Day   Substances: Nicotine  Substance and Sexual Activity   Alcohol use: Yes    Alcohol/week: 1.0 standard drink of alcohol    Types: 1 Standard drinks or equivalent per week    Comment: reports one a week   Drug use: Never   Sexual activity: Not on file  Other Topics Concern   Not on file  Social History Narrative   Not on file   Social Drivers of Health   Financial Resource Strain: Low Risk  (03/23/2023)   Overall Financial Resource Strain (CARDIA)    Difficulty of Paying Living Expenses: Not hard at  all  Food Insecurity: Food Insecurity Present (03/23/2023)   Hunger Vital Sign    Worried About Running Out of Food in the Last Year: Sometimes true    Ran Out of Food in the Last Year: Never true  Transportation Needs: No Transportation Needs (03/23/2023)   PRAPARE - Administrator, Civil Service (Medical): No    Lack of Transportation (Non-Medical): No  Physical Activity: Insufficiently Active (03/23/2023)   Exercise Vital Sign    Days of Exercise per Week: 3 days    Minutes of  Exercise per Session: 30 min  Stress: Stress Concern Present (03/23/2023)   [employer] of Occupational Health - Occupational Stress Questionnaire    Feeling of Stress : To some extent  Social Connections: Moderately Isolated (03/23/2023)   Social Connection and Isolation Panel [NHANES]    Frequency of Communication with Friends and Family: More than three times a week    Frequency of Social Gatherings with Friends and Family: Once a week    Attends Religious Services: Never    Database administrator or Organizations: No    Attends Engineer, structural: Not on file    Marital Status: Living with partner    Additional Social History: grew up with mom and sister, some challenges, denies abuse Has 3 kids and filing for custody  Allergies:  No Known Allergies  Metabolic Disorder Labs: Lab Results  Component Value Date   HGBA1C 4.8 03/10/2023   MPG 79.58 07/09/2020   No results found for: "PROLACTIN" Lab Results  Component Value Date   CHOL 166 09/19/2023   TRIG 97 09/19/2023   HDL 59 09/19/2023   CHOLHDL 2.8 09/19/2023   VLDL 29 07/09/2020   LDLCALC 89 09/19/2023   LDLCALC Comment (A) 04/06/2023   Lab Results  Component Value Date   TSH 3.450 03/10/2023    Therapeutic Level Labs: No results found for: "LITHIUM" No results found for: "CBMZ" No results found for: "VALPROATE"  Current Medications: Current Outpatient Medications  Medication Sig Dispense Refill    Amphet-Dextroamphet 3-Bead ER 25 MG CP24 Take 1 capsule (25 mg total) by mouth daily. 30 capsule 0   amphetamine-dextroamphetamine (ADDERALL XR) 20 MG 24 hr capsule Take 1 capsule (20 mg total) by mouth daily. 30 capsule 0   buPROPion ER (WELLBUTRIN SR) 100 MG 12 hr tablet TAKE 1 TABLET(100 MG) BY MOUTH TWICE DAILY 60 tablet 0   Cetirizine HCl (ZYRTEC ALLERGY PO) Take by mouth.     Cholecalciferol (VITAMIN D-3 PO) Take by mouth.     Cyanocobalamin (VITAMIN B-12 PO) Take by mouth.     fenofibrate (TRICOR) 145 MG tablet Take 1 tablet (145 mg total) by mouth daily. 90 tablet 1   gabapentin (NEURONTIN) 100 MG capsule Take 1 capsule (100 mg total) by mouth 3 (three) times daily. 90 capsule 1   Lurasidone HCl (LATUDA) 120 MG TABS Take 1 tablet (120 mg total) by mouth every evening. Take with food 30 tablet 1   metFORMIN (GLUCOPHAGE) 500 MG tablet Take 1 tablet (500 mg total) by mouth daily with breakfast. TAKE 1 TABLET(500 MG) BY MOUTH DAILY WITH BREAKFAST 90 tablet 0   propranolol (INDERAL) 10 MG tablet Take 1 tablet (10 mg total) by mouth daily. 30 tablet 1   No current facility-administered medications for this visit.     Psychiatric Specialty Exam: Review of Systems  Cardiovascular:  Negative for chest pain.  Neurological:  Negative for tremors.  Psychiatric/Behavioral:  Positive for sleep disturbance.     There were no vitals taken for this visit.There is no height or weight on file to calculate BMI.  General Appearance: Casual  Eye Contact:  Fair  Speech:  Slow  Volume:  Decreased  Mood:  tired  Affect:  Congruent  Thought Process:  Goal Directed  Orientation:  Full (Time, Place, and Person)  Thought Content:  Rumination  Suicidal Thoughts:  No  Homicidal Thoughts:  No  Memory:  Immediate;   Fair  Judgement:  Fair  Insight:  Shallow  Psychomotor  Activity:  Decreased  Concentration:  Concentration: Fair  Recall:  [employer] of Knowledge:Fair  Language: Fair  Akathisia:  No   Handed:    AIMS (if indicated):  no involuntary movements   Assets:  Desire for Improvement  ADL's:  Intact  Cognition: WNL  Sleep:  Fair   Screenings: AIMS    Flowsheet Row [facility] (Discharged[facility]  AIMS Total Score 0      AUDIT    Flowsheet Row Appointment from 03/24/2023 in [facility] Ad[facility] (Discharged) [facility] Admission (Discharged) from OP [facility]  Alcohol Use Disorder Identification Test Final Score (AUDIT) 5 2 3       GAD-7    Flowsheet Row Office Visit from 03/10/2023 in [facility] Video Visit from 09/14/2022 in [facility] Video Visit from 08/04/2022 in [facility] Video Visit from 06/16/2022 in [facility] Video Visit from 04/14/2022 in [facility] [address]  Total GAD-7 Score 6 5 4 1 2       PHQ2-9    Flowsheet Row Office Visit from 11/04/2023 in [facility] Counselor from 10/13/2023 in [facility] Office Visit from 03/10/2023 in [facility] Video Visit from 09/14/2022 in [facility] Video Visit from 08/04/2022 in [facility]  PHQ-2 Total Score 3 6 1 1 1   PHQ-9 Total Score 14 22 7  -- --      Flowsheet Row Office Visit from 11/04/2023 in [facility] [place] [facility] Counselor from 10/13/2023 in [facility] Video Visit from 09/14/2022 in [facility]  C-SSRS RISK CATEGORY Error: Q3, 4, or 5 should not be  populated when Q2 is No Moderate Risk Moderate Risk       Assessment and Plan: as follows  Prior documentation reviewed    Bipolar disorder current episode depressed : somewhat withdrawn , excessive sleepiness.  Change wellbutrin to AM instead of bid Hold off gabapentin during the day, was taking tid, take only at night Hold off after tapering half of inderal for 4 days  Consider sleep apnea evaluation , is on adderall during the day Not to sleep beyond 10am and add activities during the day Consider therapy to work on amtoivation  Generalized anxiety; manageable is on gaba but recommend to reduce it during the night only   Consider therapy and denies palpitations   ADHD; was doing fair on adderall, work on sleep hygiene and consider sleep evaluation study    Insomnia; see above, work on sleep hygiene and adjust meds as above    Fu 6m. Marland Kitchen Renewed meds which were due   Collaboration of Care: Psychiatrist [facility] providers chart reviewed   Patient/Guardian was advised Release of Information must be obtained prior to any record release in order to collaborate their care with an outside provider. Patient/Guardian was advised if they have not already done so to contact the registration department to sign all necessary forms in order for [place] to release information regarding their care.   Consent: Patient/Guardian gives verbal consent for treatment and assignment of benefits for services  provided during this visit. Patient/Guardian expressed understanding and agreed to proceed.   Thresa Ross, MD 1/30/202512:15 PM

## 2024-01-27 ENCOUNTER — Encounter (HOSPITAL_COMMUNITY): Payer: Self-pay

## 2024-01-27 ENCOUNTER — Ambulatory Visit (INDEPENDENT_AMBULATORY_CARE_PROVIDER_SITE_OTHER): Payer: Self-pay | Admitting: Licensed Clinical Social Worker

## 2024-01-27 DIAGNOSIS — Z91199 Patient's noncompliance with other medical treatment and regimen due to unspecified reason: Secondary | ICD-10-CM

## 2024-01-27 NOTE — Progress Notes (Signed)
Patient did not show for session.  Work with her includes emotional regulation how to get out of spirals, can look at emotional regulation article can look at what is brought up on AI- discuss cannot control thoughts but how we react to thoughts address sleepiness issues

## 2024-01-31 ENCOUNTER — Ambulatory Visit (INDEPENDENT_AMBULATORY_CARE_PROVIDER_SITE_OTHER): Payer: MEDICAID | Admitting: Family Medicine

## 2024-01-31 ENCOUNTER — Encounter: Payer: Self-pay | Admitting: Family Medicine

## 2024-01-31 VITALS — BP 118/83 | HR 93 | Temp 98.1°F | Resp 18 | Ht 72.0 in | Wt 293.4 lb

## 2024-01-31 DIAGNOSIS — R5382 Chronic fatigue, unspecified: Secondary | ICD-10-CM

## 2024-01-31 DIAGNOSIS — G479 Sleep disorder, unspecified: Secondary | ICD-10-CM

## 2024-01-31 NOTE — Progress Notes (Signed)
   Established Patient Office Visit  Subjective   Patient ID: Wanda Torres, female    DOB: [birth year]  Age: 33 y.o. MRN: [id number]  No chief complaint on file.   Insomnia Primary symptoms: malaise/fatigue.    Sleep disturbances Pt reports she goes to bed at 830pm. She wakes up once at night to urinate. She'll sleep until noon. She spoke to her psychiatrist who cut the Propranolol  to once a day from BID. Pt reports she is taking Latuda  at night but has been for years. Her wellbutrin  was cut to once a day from BID and her Neurontin  was cut from TID to bedtime only. She reports she is adjusting her medicines one at a time. She would also like referral for sleep evaluation.   Review of Systems  Constitutional:  Positive for malaise/fatigue.  Psychiatric/Behavioral:  The patient has insomnia.        Sleep disturbances  All other systems reviewed and are negative.    Objective:     There were no vitals taken for this visit. BP Readings from Last 3 Encounters:  01/31/24 118/83  12/27/23 130/86  11/30/23 116/81      Physical Exam Vitals and nursing note reviewed.  Constitutional:      Appearance: Normal appearance. She is obese.  HENT:     Head: Normocephalic and atraumatic.     Right Ear: External ear normal.     Left Ear: External ear normal.     Nose: Nose normal.     Mouth/Throat:     Mouth: Mucous membranes are moist.     Pharynx: Oropharynx is clear.  Eyes:     Conjunctiva/sclera: Conjunctivae normal.     Pupils: Pupils are equal, round, and reactive to light.  Cardiovascular:     Rate and Rhythm: Normal rate.  Pulmonary:     Effort: Pulmonary effort is normal.  Skin:    General: Skin is warm.     Capillary Refill: Capillary refill takes less than 2 seconds.  Neurological:     General: No focal deficit present.     Mental Status: She is alert and oriented to person, place, and time. Mental status is at baseline.  Psychiatric:        Mood and Affect:  Mood normal.        Behavior: Behavior normal.        Thought Content: Thought content normal.        Judgment: Judgment normal.    No results found for any visits on 01/31/24.  Last thyroid functions Lab Results  Component Value Date   TSH 3.450 03/10/2023      The ASCVD Risk score (Arnett DK, et al., 2019) failed to calculate for the following reasons:   The 2019 ASCVD risk score is only valid for ages 58 to 38    Assessment & Plan:   Problem List Items Addressed This Visit   None Sleep disturbances -     Ambulatory referral to Pulmonology  Chronic fatigue -     TSH -     T4, free   With chronic fatigue and sleep disorder, will check thyroid and refer to sleep evaluation. I agree with Psychiatrist that this could be medication induced.  No follow-ups on file.    Torrence CINDERELLA Barrier, MD

## 2024-02-01 ENCOUNTER — Ambulatory Visit (INDEPENDENT_AMBULATORY_CARE_PROVIDER_SITE_OTHER): Payer: MEDICAID | Admitting: Nurse Practitioner

## 2024-02-01 ENCOUNTER — Telehealth: Payer: Self-pay

## 2024-02-01 ENCOUNTER — Encounter: Payer: Self-pay | Admitting: Nurse Practitioner

## 2024-02-01 VITALS — BP 119/81 | HR 78 | Temp 98.0°F | Ht 72.0 in | Wt 290.0 lb

## 2024-02-01 DIAGNOSIS — Z6839 Body mass index (BMI) 39.0-39.9, adult: Secondary | ICD-10-CM

## 2024-02-01 DIAGNOSIS — E65 Localized adiposity: Secondary | ICD-10-CM

## 2024-02-01 LAB — TSH: TSH: 4.9 u[IU]/mL — ABNORMAL HIGH (ref 0.450–4.500)

## 2024-02-01 LAB — T4, FREE: Free T4: 1 ng/dL (ref 0.82–1.77)

## 2024-02-01 MED ORDER — WEGOVY 0.25 MG/0.5ML ~~LOC~~ SOAJ: 0.2500 mg | SUBCUTANEOUS | 0 refills | Status: DC

## 2024-02-01 NOTE — Telephone Encounter (Signed)
 PA submitted through Cover My Meds for [employer] . Awaiting insurance determination.  Key: [id number]

## 2024-02-01 NOTE — Progress Notes (Signed)
 Office: [phone]  /  Fax: [phone]  WEIGHT SUMMARY AND BIOMETRICS  Weight Lost Since Last Visit: 3lb  Weight Gained Since Last Visit: 0lb   Vitals Temp: 98 F (36.7 C) BP: 119/81 Pulse Rate: 78 SpO2: 98 %   Anthropometric Measurements Height: 6' (1.829 m) Weight: 290 lb (131.5 kg) BMI (Calculated): 39.32 Weight at Last Visit: 293lb Weight Lost Since Last Visit: 3lb Weight Gained Since Last Visit: 0lb Starting Weight: 288lb Total Weight Loss (lbs): 0 lb (0 kg)   Body Composition  Body Fat %: 48.1 % Fat Mass (lbs): 139.8 lbs Muscle Mass (lbs): 143.4 lbs Total Body Water (lbs): 99.8 lbs Visceral Fat Rating : 12   Other Clinical Data Fasting: Yes Labs: No Today's Visit #: 8 Starting Date: 04/06/23     HPI  Chief Complaint: OBESITY  Wanda Torres is here to discuss her progress with her obesity treatment plan. She is on the the Category 4 Plan and states she is following her eating plan approximately 50 % of the time. She states she is exercising 0 minutes 0 days per week.   Interval History:  Since last office visit she has lost 3 pounds. She is aiming to eat more protein.  She is drinking more water and some alcoholic drinks.  She started working out with her systems analyst yesterday. Plans to meet with him 3 times per week.  She is drinking water and a protein shake daily.  Notes polyphagia.  Pharmacotherapy for weight loss: She is not currently taking medications  for medical weight loss.     Previous pharmacotherapy for medical weight loss:  none   Bariatric surgery:  Patient has not had bariatric surgery  Visceral obesity Maintaining.    PHYSICAL EXAM:  Blood pressure 119/81, pulse 78, temperature 98 F (36.7 C), height 6' (1.829 m), weight 290 lb (131.5 kg), SpO2 98%. Body mass index is 39.33 kg/m.  General: She is overweight, cooperative, alert, well developed, and in no acute distress. PSYCH: Has normal mood, affect and thought  process.   Extremities: No edema.  Neurologic: No gross sensory or motor deficits. No tremors or fasciculations noted.    DIAGNOSTIC DATA REVIEWED:  BMET    Component Value Date/Time   NA 139 03/10/2023 1438   K 4.2 03/10/2023 1438   CL 101 03/10/2023 1438   CO2 22 03/10/2023 1438   GLUCOSE 88 03/10/2023 1438   GLUCOSE 63 (L) 09/15/2020 2229   BUN 15 03/10/2023 1438   CREATININE 0.80 03/10/2023 1438   CALCIUM 9.5 03/10/2023 1438   GFRNONAA >60 09/15/2020 2229   GFRAA >60 09/15/2020 2229   Lab Results  Component Value Date   HGBA1C 4.8 03/10/2023   HGBA1C 4.4 (L) 07/09/2020   Lab Results  Component Value Date   INSULIN  6.0 04/06/2023   Lab Results  Component Value Date   TSH 4.900 (H) 01/31/2024   CBC    Component Value Date/Time   WBC 5.3 03/10/2023 1438   WBC 7.2 09/15/2020 2229   RBC 4.49 03/10/2023 1438   RBC 4.78 09/15/2020 2229   HGB 15.0 03/10/2023 1438   HCT 43.9 03/10/2023 1438   PLT 254 03/10/2023 1438   MCV 98 (H) 03/10/2023 1438   MCH 33.4 (H) 03/10/2023 1438   MCH 33.7 09/15/2020 2229   MCHC 34.2 03/10/2023 1438   MCHC 35.8 09/15/2020 2229   RDW 13.8 03/10/2023 1438   Iron Studies    Component Value Date/Time   IRON 79 05/01/2018  1740   Lipid Panel     Component Value Date/Time   CHOL 166 09/19/2023 1017   TRIG 97 09/19/2023 1017   HDL 59 09/19/2023 1017   CHOLHDL 2.8 09/19/2023 1017   CHOLHDL 2.2 07/09/2020 1745   VLDL 29 07/09/2020 1745   LDLCALC 89 09/19/2023 1017   Hepatic Function Panel     Component Value Date/Time   PROT 6.9 03/10/2023 1438   ALBUMIN 4.5 03/10/2023 1438   AST 26 03/10/2023 1438   ALT 29 03/10/2023 1438   ALKPHOS 68 03/10/2023 1438   BILITOT 0.5 03/10/2023 1438   BILIDIR 0.1 07/09/2020 1745   IBILI 0.6 07/09/2020 1745      Component Value Date/Time   TSH 4.900 (H) 01/31/2024 1412   Nutritional Lab Results  Component Value Date   VD25OH 41.3 03/10/2023   VD25OH 37.1 05/01/2018     ASSESSMENT  AND PLAN  TREATMENT PLAN FOR OBESITY:  Recommended Dietary Goals  Wanda Torres is currently in the action stage of change. As such, her goal is to continue weight management plan. She has agreed to the Category 4 Plan.  Behavioral Intervention  We discussed the following Behavioral Modification Strategies today: increasing lean protein intake to established goals, decreasing simple carbohydrates , increasing vegetables, increasing water intake , work on meal planning and preparation, reading food labels , keeping healthy foods at home, work on managing stress, creating time for self-care and relaxation, continue to work on implementation of reduced calorie nutritional plan, continue to practice mindfulness when eating, and continue to work on maintaining a reduced calorie state, getting the recommended amount of protein, incorporating whole foods, making healthy choices, staying well hydrated and practicing mindfulness when eating..  Additional resources provided today: NA  Recommended Physical Activity Goals  Wanda Torres has been advised to work up to 150 minutes of moderate intensity aerobic activity a week and strengthening exercises 2-3 times per week for cardiovascular health, weight loss maintenance and preservation of muscle mass.   She has agreed to Think about enjoyable ways to increase daily physical activity and overcoming barriers to exercise, Increase physical activity in their day and reduce sedentary time (increase NEAT)., and Work on scheduling and tracking physical activity.    Pharmacotherapy We discussed various medication options to help Wanda Torres with her weight loss efforts and we both agreed to start Wegovy  0.25mg .  side effects discussed.  Patient is not to get pregnant while taking due to possibilities of birth defects.  Contraindications:  Pancreatitis (active gallstones) Medullary thyroid cancer High triglycerides (>500)-will need labs prior to starting Multiple  Endocrine Neoplasia syndrome type 2 (MEN 2) Trying to get pregnant Breastfeeding Use with caution with taking insulin  or sulfonylureas (will need to monitor blood sugars for hypoglycemia)  ASSOCIATED CONDITIONS ADDRESSED TODAY  Action/Plan  Visceral obesity -     Wegovy ; Inject 0.25 mg into the skin once a week.  Dispense: 2 mL; Refill: 0  Morbid obesity (HCC) -     Wegovy ; Inject 0.25 mg into the skin once a week.  Dispense: 2 mL; Refill: 0  BMI 39.0-39.9,adult -     Wegovy ; Inject 0.25 mg into the skin once a week.  Dispense: 2 mL; Refill: 0      Will check labs at next visit   Return in about 4 weeks (around 02/29/2024).SABRA She was informed of the importance of frequent follow up visits to maximize her success with intensive lifestyle modifications for her multiple health conditions.   ATTESTASTION STATEMENTS:  Reviewed by clinician on day of visit: allergies, medications, problem list, medical history, surgical history, family history, social history, and previous encounter notes.     Wanda Torres SAUNDERS. Jaclynn Laumann FNP-C

## 2024-02-01 NOTE — Patient Instructions (Addendum)
 Steps to starting your [facility]  The office staff will send a prior authorization request to your insurance company for approval. We will send you a mychart message once we hear back from your insurance with a decision.  This can take up to 7-10 business days.   Once your WegovyTis approved, you may then pick up Wegovy  pen from your pharmacy.    Learn how to do Wegovy  injections on the Wegovy .com website. There is a training video that will walk you through how to safely perform the injection. If you have questions for our clinical staff, please contact our  clinical staff. If you have any symptoms of allergic reaction to [facility] discontinue immediately and call 911.  1. What should I tell my provider before using WegovyT ? have or have had problems with your pancreas or kidneys. have type 2 diabetes and a history of diabetic retinopathy. have or have had depression, suicidal thoughts, or mental health issues. are pregnant or plan to become pregnant. Bosie may harm your unborn baby. You should stop using WegovyT 3 months before you plan to become pregnant or if you are breastfeeding or plan to breastfeed. It is not known if WegovyT passes into your breast milk.  2. What is Bosie and how does it work?  Bosie is an injectable prescription medication prescribed by your provider to help with your weight loss.  This medicine will be most effective when combined with a reduced calorie diet and physical activity.  Bosie is not for the treatment of type 2 diabetes mellitus. Bosie should not be used with other GLP-1 receptor agonist medicines. The addition of WegovyT in  patients treated with insulin  has not been evaluated. When initiating WegovyT, consider reducing the dose of concomitantly administered insulin  secretagogues (such as sulfonylureas) or insulin  to reduce the risk of  hypoglycemia.  One role of GLP-1 is to send a signal to your brain to tell it you are full. It also slows down  stomach emptying which will make you feel full longer and may help with reducing cravings.   3.  How should I take WegovyT?  Administer WegovyT once weekly, on the same day each week, at any time of day, with or without meals Inject subcutaneously in the abdomen, thigh or upper arm Initiate at 0.25 mg once weekly for 4 weeks. In 4 week intervals, increase the dose until a dose of 2.4 mg is reached (we will discuss with you the dosage at each visit). The maintenance dose of WegovyT is 2.4 mg once weekly.  The dosing schedule of Wegovy  is:  0.25 mg per week X 4 weeks 0.5 mg per week X 4 weeks 1.0 mg per week X 4 weeks 1.7 mg per week X 4 weeks 2.4 mg per week   Missed dose   If you miss your injection day, go ahead inject your current dose. You can go >7 days, but not <7 days between injections. You may change your injection day (It must be >7 days). If you miss >2 doses, you can still keep next injection dose the same or follow de-escalation schedule which may minimize GI symptoms.   In patients with type 2 diabetes, monitor blood glucose prior to starting and during WEGOVYT treatment.   Inject your dose of Wegovy  under the skin (subcutaneous injection) in your stomach area (abdomen), upper leg (thigh) or upper arm. Do not inject into a vein or a muscle. The injection site should be rotated and not given in the  same spot each day. Hold the needle under the skin and count to "10". This will allow all of the medicine to be dispensed under the skin. Always wipe your skin with an alcohol prep pad before injection  Dispose of used pen in an approved sharps container. More practical options that can be put in the trash  to go to the landfill are milk jugs or plastic laundry detergent containers with a screw on lid.  What side effects may I notice from taking WegovyT?  Side effects that usually do not require medical attention (report to our office if they continue or are bothersome): Nausea  (most common but decreases over time in most people as their body gets used to the medicine) Diarrhea Constipation (you may take an over the counter laxative if needed) Headache Decreased appetite Upset stomach Tiredness Dizziness Feeling bloated Hair loss Belching Gas Heartburn  Side effects that you should call 911 as soon as possible Vomiting Stomach pain Fever Yellowing of your skin or eyes  Clay-colored stools Increased heart rate while at rest Low blood sugar  Sudden changes in mood, behaviors, thoughts, feelings, or thoughts of suicide If you get a lump or swelling in your neck, hoarseness, trouble swallowing, or shortness of breath. Allergic reaction such as skin rash, itching, hives, swelling of the face, tongue, or lips  Helpful tips for managing nausea Nausea is a common side effect when first starting WegovyT. If you experience nausea, be sure to connect with your health care provider. He or she will offer guidance on ways to manage it, which may include: Eat bland, low-fat foods, like crackers, toast and rice  Eat foods that contain water, like soups and gelatin  Avoid lying down after you eat  Go outdoors for fresh air  Eat more slowly    Other important information Do not drop your pen or knock it against hard surfaces  Do not expose your pen to any liquids  If you think that your pen may be damaged, do not try to fix it. Use a new one Keep the pen cap on until you are ready to inject. Your pen will no longer be sterile if you store an unused pen without the cap, if you pull the pen cap off and put it on again, or if the pen cap is missing. This could lead to an infection  Store the [place] pen in the refrigerator from 26F to 65F (2C to 8C) If needed, before removing the pen cap, WegovyT can be stored from 8C to 30C (65F to 38F) in the original carton for up to 28 days.  Keep WegovyT in the original carton to protect it from light  Do not freeze   Throw away pen if WegovyT has been frozen, has been exposed to light or temperatures above 38F (30C), or has been out of the refrigerator for 28 days or longer It's important to properly dispose of your used WegovyT pens. Do not throw the pen away in your household trash. Instead, use an FDA-cleared sharps disposable container or a sturdy household container with a tight-fitting lid, like a heavy duty plastic container.   Wegovy  pen training website: https://www.wegovy .com/about-wegovy /how-to-use-the-wegovy -pen.html  Wegovy  savings and support link: https://www.wegovy .com/saving-and-support/save-and-support.html   s What is a GLP-1 Glucagon like peptide-1 (GLP-1) agonists represent a class of medications used to treat type 2 diabetes mellitus and obesity.  GLP-1 medications mimic the action of a hormone called glucagon like peptide 1.  When blood sugar levels start to  rise/increase these drugs stimulate the body to produce more insulin .  When that happens, the extra insulin  helps to lower the blood sugar levels in the body.  This in returns helps with decreasing cravings.  These medications also slow the movement of food from the stomach into the small intestine.  This in return helps one to full faster and longer.   Diabetic medications: Approved for treatment of diabetes mellitus but does not have full approval for weight loss use Victoza (liraglutide) Ozempic (semaglutide ) Mounjaro Trulicity Rybelsus  Weight loss medications: Approved for long-term weight loss use.        Saxenda (liraglutide) Wegovy  (semaglutide ) Zepbound Contraindications:  Pancreatitis (active gallstones) Medullary thyroid cancer High triglycerides (>500)-will need labs prior to starting Multiple Endocrine Neoplasia syndrome type 2 (MEN 2) Trying to get pregnant Breastfeeding Use with caution with taking insulin  or sulfonylureas (will need to monitor blood sugars for hypoglycemia) Side effects (most  common): Most common side effects are nausea, gas, bloating and constipation.  Other possible side effects are headaches, belching, diarrhea, tiredness (fatigue), vomiting, upset stomach, dizziness, heartburn and stomach (abdominal pain).  If you think that you are becoming dehydrated, please inform our office or your primary family provider.  Stop immediately and go to ER if you have any symptoms of a serious allergic reaction including swelling of your face, lips, tongue or throat; problems breathing or swallowing; severe rash or itching; fainting or feeling dizzy; or very rapid heart rate.

## 2024-02-02 ENCOUNTER — Telehealth (HOSPITAL_COMMUNITY): Payer: Self-pay | Admitting: Licensed Clinical Social Worker

## 2024-02-02 ENCOUNTER — Other Ambulatory Visit: Payer: Self-pay | Admitting: Family Medicine

## 2024-02-02 ENCOUNTER — Encounter: Payer: Self-pay | Admitting: Family Medicine

## 2024-02-02 DIAGNOSIS — E039 Hypothyroidism, unspecified: Secondary | ICD-10-CM

## 2024-02-02 MED ORDER — LEVOTHYROXINE SODIUM 25 MCG PO TABS: 25.0000 ug | ORAL_TABLET | Freq: Every day | ORAL | 3 refills | Status: DC

## 2024-02-02 NOTE — Telephone Encounter (Signed)
 Received call from Guardian Ad Litem Carlyon Ditty of [facility] requesting call from patient's  therapist to discuss treatment progress. Direct line is [phone]. Email: [email or web address] if easier to coordinate a time. ROI signed by patient and uploaded to medical record.

## 2024-02-06 ENCOUNTER — Telehealth (HOSPITAL_COMMUNITY): Payer: Self-pay | Admitting: Licensed Clinical Social Worker

## 2024-02-06 MED ORDER — AMPHETAMINE-DEXTROAMPHET ER 20 MG PO CP24: 20.0000 mg | ORAL_CAPSULE | Freq: Every day | ORAL | 0 refills | Status: DC

## 2024-02-06 NOTE — Addendum Note (Signed)
 Addended by: Wray Heady on: 02/06/2024 03:58 PM   Modules accepted: Orders

## 2024-02-06 NOTE — Telephone Encounter (Signed)
 Therapist returning call from Paige Gillard Guardium ad litem for patient released by patient was given.  Therapist updated her noted last time saw patient was 1212 significant improvement with mood because of meds has seen her 5 times did not see her in January page explaining her 2 younger kids came to see her and left so after they left in December had a depressive episode explains what happened.  She stopped going to [employer].  Therapist recommendation is she continue with therapy for period of time to have consistency of stability with symptoms.  Guardian ad litem as well would like for her to see good follow-through not drinking to show her daughter she added there is reunification therapy for her daughter every 2 weeks but is once a month because of cost that she can see going on longer.  Therapist at this point recommends every 2 weeks.  Shared working on coping skills noted from record it seems that she is consistent with Dr. Working on her medications.  She will be with patient until there is some permanency and placement. Talked to [facility]. [phone]

## 2024-02-06 NOTE — Telephone Encounter (Signed)
 Refill Request  amphetamine -dextroamphetamine  (ADDERALL XR) 20 MG 24 hr capsule   [id number] - , [place] - [address]   NEXT APPT 02/10/24 LAST APPT 12/02/23

## 2024-02-06 NOTE — Telephone Encounter (Signed)
 Received fax from [employer] stating that Wegovy  has been approved from 02/01/24-07/31/24.

## 2024-02-08 NOTE — Telephone Encounter (Signed)
Talked to [employer] ad litem see note in chart

## 2024-02-09 ENCOUNTER — Ambulatory Visit (INDEPENDENT_AMBULATORY_CARE_PROVIDER_SITE_OTHER): Payer: Self-pay | Admitting: Licensed Clinical Social Worker

## 2024-02-09 ENCOUNTER — Encounter (HOSPITAL_COMMUNITY): Payer: Self-pay

## 2024-02-09 DIAGNOSIS — Z91199 Patient's noncompliance with other medical treatment and regimen due to unspecified reason: Secondary | ICD-10-CM

## 2024-02-09 NOTE — Progress Notes (Signed)
Patient is a late cancel for appointment

## 2024-02-10 ENCOUNTER — Telehealth (HOSPITAL_COMMUNITY): Payer: MEDICAID | Admitting: Psychiatry

## 2024-02-10 ENCOUNTER — Encounter (HOSPITAL_COMMUNITY): Payer: Self-pay

## 2024-02-23 ENCOUNTER — Ambulatory Visit (INDEPENDENT_AMBULATORY_CARE_PROVIDER_SITE_OTHER): Payer: MEDICAID | Admitting: Licensed Clinical Social Worker

## 2024-02-23 DIAGNOSIS — F411 Generalized anxiety disorder: Secondary | ICD-10-CM | POA: Diagnosis not present

## 2024-02-23 DIAGNOSIS — F900 Attention-deficit hyperactivity disorder, predominantly inattentive type: Secondary | ICD-10-CM

## 2024-02-23 DIAGNOSIS — F314 Bipolar disorder, current episode depressed, severe, without psychotic features: Secondary | ICD-10-CM

## 2024-02-23 NOTE — Progress Notes (Signed)
 Virtual Visit via Video Note  I connected with Wanda Torres on 02/23/24 at  1:00 PM EST by a video enabled telemedicine application and verified that I am speaking with the correct person using two identifiers.  Location: Patient: stationary car Provider: home office   I discussed the limitations of evaluation and management by telemedicine and the availability of in person appointments. The patient expressed understanding and agreed to proceed.  I discussed the assessment and treatment plan with the patient. The patient was provided an opportunity to ask questions and all were answered. The patient agreed with the plan and demonstrated an understanding of the instructions.   The patient was advised to call back or seek an in-person evaluation if the symptoms worsen or if the condition fails to improve as anticipated.  I provided 20 minutes of non-face-to-face time during this encounter.  THERAPIST PROGRESS NOTE  Session Time: 1:00 PM to 1:20 PM  Participation Level: Active  Behavioral Response: CasualAlertappropriate  Type of Therapy: Individual Therapy  Treatment Goals addressed: Work on decreasing depression, effective coping skills work on symptoms of trauma  ProgressTowards Goals: Progressing-continue to focus on coping skills to help with emotional regulation  Interventions: Solution Focused, Strength-based, Supportive, and Other: emotional regulation  Summary: Wanda Torres is a 33 y.o. female who presents with patient had to have a short session had to get to an appointment with lawyer.  Continue to work with patient on her issues how to get out of spirals.  Look at different articles 1 on emotional regulation 1 on overthinking to help build skills with this. Therapist offered emotional support, active listening and validation of patient's emotional experience as appropriate during session.  Provider utilized direct questioning discussed current and underlying  problems assisted through strategic reflection problem solving and elaborating on realistic coping skills.   Suicidal/Homicidal: No  Plan: Return again in 2 weeks.2.  Look at article on emotional regulation, ARI for overthinking article on overthinking discussed not being able to control thoughts but how we react to them.3.  Look at it AI search for emotional regulation  Diagnosis: Bipolar disorder current episode depressed severe without psychotic features ADHD primarily inattentive type, Generalized anxiety disorder  Collaboration of Care: Other none needed  Patient/Guardian was advised Release of Information must be obtained prior to any record release in order to collaborate their care with an outside provider. Patient/Guardian was advised if they have not already done so to contact the registration department to sign all necessary forms in order for [place] to release information regarding their care.   Consent: Patient/Guardian gives verbal consent for treatment and assignment of benefits for services provided during this visit. Patient/Guardian expressed understanding and agreed to proceed.   [place], [place] 02/23/2024

## 2024-02-27 ENCOUNTER — Telehealth (HOSPITAL_COMMUNITY): Payer: Self-pay | Admitting: *Deleted

## 2024-02-27 DIAGNOSIS — F314 Bipolar disorder, current episode depressed, severe, without psychotic features: Secondary | ICD-10-CM

## 2024-02-27 MED ORDER — BUPROPION HCL ER (SR) 100 MG PO TB12: 100.0000 mg | ORAL_TABLET | Freq: Two times a day (BID) | ORAL | 0 refills | Status: DC

## 2024-02-27 NOTE — Addendum Note (Signed)
 Addended by: Thresa Ross on: 02/27/2024 12:57 PM   Modules accepted: Orders

## 2024-02-27 NOTE — Telephone Encounter (Signed)
 Patient Refill Request [id number] - [place], [place] - [address] AT [facility] [address]   buPROPion ER [facility]) 100 MG 12 hr tablet  Last Fill Date 11/23/23   NEXT APPT  03/07/24 LAST APPT 02/10/24

## 2024-02-29 ENCOUNTER — Ambulatory Visit: Payer: MEDICAID | Admitting: Nurse Practitioner

## 2024-02-29 ENCOUNTER — Encounter: Payer: Self-pay | Admitting: Nurse Practitioner

## 2024-02-29 VITALS — BP 114/74 | HR 98 | Temp 98.0°F | Ht 72.0 in | Wt 289.0 lb

## 2024-02-29 DIAGNOSIS — Z6839 Body mass index (BMI) 39.0-39.9, adult: Secondary | ICD-10-CM

## 2024-02-29 DIAGNOSIS — E039 Hypothyroidism, unspecified: Secondary | ICD-10-CM

## 2024-02-29 DIAGNOSIS — E66812 Obesity, class 2: Secondary | ICD-10-CM

## 2024-02-29 DIAGNOSIS — E6609 Other obesity due to excess calories: Secondary | ICD-10-CM

## 2024-02-29 MED ORDER — WEGOVY 0.5 MG/0.5ML ~~LOC~~ SOAJ: 0.5000 mg | SUBCUTANEOUS | 0 refills | Status: DC

## 2024-02-29 NOTE — Progress Notes (Signed)
 Office: [phone]  /  Fax: [phone]  WEIGHT SUMMARY AND BIOMETRICS  Weight Lost Since Last Visit: 1lb  Weight Gained Since Last Visit: 0lb   Vitals Temp: 98 F (36.7 C) BP: 114/74 Pulse Rate: 98 SpO2: 96 %   Anthropometric Measurements Height: 6' (1.829 m) Weight: 289 lb (131.1 kg) BMI (Calculated): 39.19 Weight at Last Visit: 290lb Weight Lost Since Last Visit: 1lb Weight Gained Since Last Visit: 0lb Starting Weight: 288lb Total Weight Loss (lbs): 0 lb (0 kg)   Body Composition  Body Fat %: 48.2 % Fat Mass (lbs): 139.4 lbs Muscle Mass (lbs): 142.4 lbs Total Body Water (lbs): 98.4 lbs Visceral Fat Rating : 12   Other Clinical Data Fasting: Yes Labs: No Today's Visit #: 9 Starting Date: 04/06/23     HPI  Chief Complaint: OBESITY  Wanda Torres is here to discuss her progress with her obesity treatment plan. She is on the the Category 4 Plan and states she is following her eating plan approximately 100 % of the time. She states she is exercising 30 minutes 3 days per week.   Interval History:  Since last office visit she has lost 1 pound. She is eating 3 meals with a protein daily.  Reports meeting calories and protein goals.  She is drinking water daily.  Denies sugary drinks. She is going to the gym 3 days per week-resistance training.     Pharmacotherapy for weight loss: She is currently taking Wegovy 0.25mg  x 4 doses for medical weight loss.  Denies side effects.    Reginal Lutes has been approved from 02/01/24-07/31/24   Previous pharmacotherapy for medical weight loss:  none   Bariatric surgery:  Patient has not had bariatric surgery   Hypothyroidism Stable.  Does not report symptoms associated with uncontrolled hypothyroidism. Medication(s): Levothyroxine 25 mcg daily-started by PCP on 02/22/24 Lab Results  Component Value Date   TSH 4.900 (H) 01/31/2024    PHYSICAL EXAM:  Blood pressure 114/74, pulse 98, temperature 98 F (36.7 C), height 6'  (1.829 m), weight 289 lb (131.1 kg), SpO2 96%. Body mass index is 39.2 kg/m.  General: She is overweight, cooperative, alert, well developed, and in no acute distress. PSYCH: Has normal mood, affect and thought process.   Extremities: No edema.  Neurologic: No gross sensory or motor deficits. No tremors or fasciculations noted.    DIAGNOSTIC DATA REVIEWED:  BMET    Component Value Date/Time   NA 139 03/10/2023 1438   K 4.2 03/10/2023 1438   CL 101 03/10/2023 1438   CO2 22 03/10/2023 1438   GLUCOSE 88 03/10/2023 1438   GLUCOSE 63 (L) 09/15/2020 2229   BUN 15 03/10/2023 1438   CREATININE 0.80 03/10/2023 1438   CALCIUM 9.5 03/10/2023 1438   GFRNONAA >60 09/15/2020 2229   GFRAA >60 09/15/2020 2229   Lab Results  Component Value Date   HGBA1C 4.8 03/10/2023   HGBA1C 4.4 (L) 07/09/2020   Lab Results  Component Value Date   INSULIN 6.0 04/06/2023   Lab Results  Component Value Date   TSH 4.900 (H) 01/31/2024   CBC    Component Value Date/Time   WBC 5.3 03/10/2023 1438   WBC 7.2 09/15/2020 2229   RBC 4.49 03/10/2023 1438   RBC 4.78 09/15/2020 2229   HGB 15.0 03/10/2023 1438   HCT 43.9 03/10/2023 1438   PLT 254 03/10/2023 1438   MCV 98 (H) 03/10/2023 1438   MCH 33.4 (H) 03/10/2023 1438   MCH 33.7 09/15/2020  2229   MCHC 34.2 03/10/2023 1438   MCHC 35.8 09/15/2020 2229   RDW 13.8 03/10/2023 1438   Iron Studies    Component Value Date/Time   IRON 79 05/01/2018 1740   Lipid Panel     Component Value Date/Time   CHOL 166 09/19/2023 1017   TRIG 97 09/19/2023 1017   HDL 59 09/19/2023 1017   CHOLHDL 2.8 09/19/2023 1017   CHOLHDL 2.2 07/09/2020 1745   VLDL 29 07/09/2020 1745   LDLCALC 89 09/19/2023 1017   Hepatic Function Panel     Component Value Date/Time   PROT 6.9 03/10/2023 1438   ALBUMIN 4.5 03/10/2023 1438   AST 26 03/10/2023 1438   ALT 29 03/10/2023 1438   ALKPHOS 68 03/10/2023 1438   BILITOT 0.5 03/10/2023 1438   BILIDIR 0.1 07/09/2020 1745    IBILI 0.6 07/09/2020 1745      Component Value Date/Time   TSH 4.900 (H) 01/31/2024 1412   Nutritional Lab Results  Component Value Date   VD25OH 41.3 03/10/2023   VD25OH 37.1 05/01/2018     ASSESSMENT AND PLAN  TREATMENT PLAN FOR OBESITY:  Recommended Dietary Goals  Wanda Torres is currently in the action stage of change. As such, her goal is to continue weight management plan. She has agreed to the Category 4 Plan.  Behavioral Intervention  We discussed the following Behavioral Modification Strategies today: increasing lean protein intake to established goals, decreasing simple carbohydrates , increasing vegetables, increasing fiber rich foods, increasing water intake , reading food labels , keeping healthy foods at home, continue to work on implementation of reduced calorie nutritional plan, continue to practice mindfulness when eating, and continue to work on maintaining a reduced calorie state, getting the recommended amount of protein, incorporating whole foods, making healthy choices, staying well hydrated and practicing mindfulness when eating..  Additional resources provided today: NA  Recommended Physical Activity Goals  Wanda Torres has been advised to work up to 150 minutes of moderate intensity aerobic activity a week and strengthening exercises 2-3 times per week for cardiovascular health, weight loss maintenance and preservation of muscle mass.   She has agreed to Think about enjoyable ways to increase daily physical activity and overcoming barriers to exercise, Increase physical activity in their day and reduce sedentary time (increase NEAT)., Increase the intensity, frequency or duration of strengthening exercises , and Increase the intensity, frequency or duration of aerobic exercises     Pharmacotherapy We discussed various medication options to help [facility] with her weight loss efforts and we both agreed to increase Wegovy 0.5mg . Side effects  discussed.  ASSOCIATED CONDITIONS ADDRESSED TODAY  Action/Plan  Acquired hypothyroidism To contact PCP for follow up  Class 2 obesity due to excess calories without serious comorbidity with body mass index (BMI) of 39.0 to 39.9 in adult -     ZOXWRU; Inject 0.5 mg into the skin once a week.  Dispense: 2 mL; Refill: 0         Return in about 4 weeks (around 03/28/2024).Marland Kitchen She was informed of the importance of frequent follow up visits to maximize her success with intensive lifestyle modifications for her multiple health conditions.   ATTESTASTION STATEMENTS:  Reviewed by clinician on day of visit: allergies, medications, problem list, medical history, surgical history, family history, social history, and previous encounter notes.     Theodis Sato. Wanda Mijangos FNP-C

## 2024-03-07 ENCOUNTER — Telehealth (HOSPITAL_COMMUNITY): Payer: MEDICAID | Admitting: Psychiatry

## 2024-03-07 ENCOUNTER — Encounter (HOSPITAL_COMMUNITY): Payer: Self-pay | Admitting: Psychiatry

## 2024-03-07 DIAGNOSIS — F411 Generalized anxiety disorder: Secondary | ICD-10-CM

## 2024-03-07 DIAGNOSIS — F3162 Bipolar disorder, current episode mixed, moderate: Secondary | ICD-10-CM

## 2024-03-07 DIAGNOSIS — F314 Bipolar disorder, current episode depressed, severe, without psychotic features: Secondary | ICD-10-CM

## 2024-03-07 DIAGNOSIS — R000 Tachycardia, unspecified: Secondary | ICD-10-CM

## 2024-03-07 DIAGNOSIS — G4700 Insomnia, unspecified: Secondary | ICD-10-CM | POA: Diagnosis not present

## 2024-03-07 DIAGNOSIS — F900 Attention-deficit hyperactivity disorder, predominantly inattentive type: Secondary | ICD-10-CM | POA: Diagnosis not present

## 2024-03-07 MED ORDER — LURASIDONE HCL 120 MG PO TABS: 120.0000 mg | ORAL_TABLET | Freq: Every evening | ORAL | 1 refills | Status: DC

## 2024-03-07 MED ORDER — PROPRANOLOL HCL 10 MG PO TABS: 10.0000 mg | ORAL_TABLET | Freq: Every day | ORAL | 1 refills | Status: DC

## 2024-03-07 MED ORDER — TRAZODONE HCL 50 MG PO TABS: 50.0000 mg | ORAL_TABLET | Freq: Every day | ORAL | 0 refills | Status: DC

## 2024-03-07 MED ORDER — BUPROPION HCL ER (SR) 100 MG PO TB12: 100.0000 mg | ORAL_TABLET | Freq: Two times a day (BID) | ORAL | 0 refills | Status: DC

## 2024-03-07 MED ORDER — AMPHETAMINE-DEXTROAMPHET ER 20 MG PO CP24: 20.0000 mg | ORAL_CAPSULE | Freq: Every day | ORAL | 0 refills | Status: DC

## 2024-03-07 MED ORDER — GABAPENTIN 100 MG PO CAPS: 100.0000 mg | ORAL_CAPSULE | Freq: Three times a day (TID) | ORAL | 1 refills | Status: DC

## 2024-03-07 NOTE — Progress Notes (Signed)
 [facility] Follow up visit  Patient Identification: Wanda Torres MRN:  [id number] Date of Evaluation:  03/07/2024 Referral Source: [facility] . Dr. Hazle Quant Chief Complaint:   No chief complaint on file. Follow up depression  Visit Diagnosis:    ICD-10-CM   1. Bipolar disorder, current episode depressed, severe, without psychotic features (HCC)  F31.4 buPROPion ER (WELLBUTRIN SR) 100 MG 12 hr tablet    2. Generalized anxiety disorder  F41.1 gabapentin (NEURONTIN) 100 MG capsule    3. Attention deficit hyperactivity disorder (ADHD), predominantly inattentive type  F90.0     4. Insomnia, unspecified type  G47.00     5. Bipolar disorder, current episode mixed, moderate (HCC)  F31.62 Lurasidone HCl (LATUDA) 120 MG TABS    6. Tachycardia  R00.0 propranolol (INDERAL) 10 MG tablet     Virtual Visit via Video Note  I connected with Wanda Torres on 03/07/24 at  1:00 PM EDT by a video enabled telemedicine application and verified that I am speaking with the correct person using two identifiers.  Location: Patient: home Provider: home office   I discussed the limitations of evaluation and management by telemedicine and the availability of in person appointments. The patient expressed understanding and agreed to proceed.      I discussed the assessment and treatment plan with the patient. The patient was provided an opportunity to ask questions and all were answered. The patient agreed with the plan and demonstrated an understanding of the instructions.   The patient was advised to call back or seek an in-person evaluation if the symptoms worsen or if the condition fails to improve as anticipated.  I provided 18 minutes of non-face-to-face time during this encounter.     History of Present Illness: Patient is a 33 years old white female currently living with her fianc initially referred by [facility] as she is a resident of [facility] in [place] so transferred from [facility]  patient.  Diagnosed with bipolar, ADD, anxiety.  Patient currently living with her fianc she has 3 kids were not living with her and 2 of the kids are with her ex and 1 kid is with her sister and is going thru custody concerns   Last visit was feeling tired, somewhat subdued We lowered inderal , no palpitaitons  Also she got checked by PCP got diagnosed hypothyroidism and now on medications Energy is better, depression anxiety is better as well Keeps busy with animals and has supportive Fiance  She didn't lower the gaba as now doing better not fatigue Also on adderall  Last visit was doing fair on not being on prozac and was on wellbutrin. Also on latuda Changed prozac due to libido concerns Also on adderall  Aggravating factors; kids, custody concern Modifying factors; her kids, fianc,  animals  Duration more than 10 years Severity better motivation    Past Psychiatric History: Bipolar, anxiety, depression  Previous Psychotropic Medications: Yes  Wellbutrin, cymbalta, zoloft . Says didn't work  Substance Abuse History in the last 12 months:  Yes.    Consequences of Substance Abuse: Has been drinking till last 2 weeks, discussed abstinence and alcohol effect to depression, judjement  Past Medical History:  Past Medical History:  Diagnosis Date   Acne    ADD (attention deficit disorder)    Alcohol abuse    Allergy    Anemia    Anxiety    ASCUS with positive high risk human papillomavirus of vagina 05/2022   Bipolar 1 disorder (HCC)  Depression    Depression    Drug use    Hypothyroidism    Joint pain    PCOS (polycystic ovarian syndrome)    Psoriasis    Supervision of other high risk pregnancy, antepartum 01/19/2019   Formatting of this note might be different from the original. Partner prefers She/Her pronouns   Swelling of lower extremity    Vitamin B 12 deficiency    Vitamin D deficiency     Past Surgical History:  Procedure Laterality Date   COLPOSCOPY   07/2022   ENDOCERVICAL MUCOSA WITH CHRONIC CERVICITIS.   NO SQUAMOUS MUCOSA IDENTIFIED.   NO EVIDENCE OF DYSPLASIA OR MALIGNANCY.    Family Psychiatric History: Aunt : bipolar, Mom; depression  Family History:  Family History  Problem Relation Age of Onset   Anxiety disorder Mother    Depression Mother    Sexual abuse Mother    Hypothyroidism Mother    High blood pressure Mother    High Cholesterol Mother    Drug abuse Father    ADD / ADHD Sister    Anxiety disorder Sister    Bipolar disorder Sister    Depression Sister    Drug abuse Sister    Sexual abuse Sister    Anxiety disorder Brother    Depression Brother    Sexual abuse Brother    Thyroid disease Maternal Grandmother        THyroid cancer   Depression Maternal Grandmother    Sexual abuse Maternal Grandmother    Alcohol abuse Maternal Grandfather    Dementia Maternal Grandfather    Depression Maternal Grandfather    Sexual abuse Maternal Grandfather    Alcohol abuse Maternal Aunt    Bipolar disorder Maternal Aunt    Depression Maternal Aunt    Sexual abuse Maternal Aunt    Bipolar disorder Maternal Aunt    Depression Maternal Aunt    Sexual abuse Maternal Aunt    ADD / ADHD Maternal Uncle    Alcohol abuse Maternal Uncle    Drug abuse Maternal Uncle    Sexual abuse Maternal Uncle     Social History:   Social History   Socioeconomic History   Marital status: Divorced    Spouse name: Not on file   Number of children: 4   Years of education: Not on file   Highest education level: Some college, no degree  Occupational History    Comment: Supervisor reservations; 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[employer]  Tobacco Use   Smoking status: Never   Smokeless tobacco: Never   Tobacco comments:    Vaps daily   Vaping Use   Vaping status: Every Day   Substances: Nicotine  Substance and Sexual Activity   Alcohol use: Yes    Alcohol/week: 1.0 standard drink of alcohol    Types: 1 Standard drinks or equivalent per week     Comment: reports one a week   Drug use: Never   Sexual activity: Not on file  Other Topics Concern   Not on file  Social History Narrative   Not on file   Social Drivers of Health   Financial Resource Strain: Low Risk  (03/23/2023)   Overall Financial Resource Strain (CARDIA)    Difficulty of Paying Living Expenses: Not hard at all  Food Insecurity: Food Insecurity Present (03/23/2023)   Hunger Vital Sign    Worried About Running Out of Food in the Last Year: Sometimes true    Ran Out of Food in the Last Year:  Never true  Transportation Needs: No Transportation Needs (03/23/2023)   PRAPARE - Administrator, Civil Service (Medical): No    Lack of Transportation (Non-Medical): No  Physical Activity: Insufficiently Active (03/23/2023)   Exercise Vital Sign    Days of Exercise per Week: 3 days    Minutes of Exercise per Session: 30 min  Stress: Stress Concern Present (03/23/2023)   [employer] of Occupational Health - Occupational Stress Questionnaire    Feeling of Stress : To some extent  Social Connections: Moderately Isolated (03/23/2023)   Social Connection and Isolation Panel [NHANES]    Frequency of Communication with Friends and Family: More than three times a week    Frequency of Social Gatherings with Friends and Family: Once a week    Attends Religious Services: Never    Database administrator or Organizations: No    Attends Engineer, structural: Not on file    Marital Status: Living with partner    Additional Social History: grew up with mom and sister, some challenges, denies abuse Has 3 kids and filing for custody  Allergies:  No Known Allergies  Metabolic Disorder Labs: Lab Results  Component Value Date   HGBA1C 4.8 03/10/2023   MPG 79.58 07/09/2020   No results found for: "PROLACTIN" Lab Results  Component Value Date   CHOL 166 09/19/2023   TRIG 97 09/19/2023   HDL 59 09/19/2023   CHOLHDL 2.8 09/19/2023   VLDL 29 07/09/2020    LDLCALC 89 09/19/2023   LDLCALC Comment (A) 04/06/2023   Lab Results  Component Value Date   TSH 4.900 (H) 01/31/2024    Therapeutic Level Labs: No results found for: "LITHIUM" No results found for: "CBMZ" No results found for: "VALPROATE"  Current Medications: Current Outpatient Medications  Medication Sig Dispense Refill   amphetamine-dextroamphetamine (ADDERALL XR) 20 MG 24 hr capsule Take 1 capsule (20 mg total) by mouth daily. 30 capsule 0   buPROPion ER (WELLBUTRIN SR) 100 MG 12 hr tablet Take 1 tablet (100 mg total) by mouth 2 (two) times daily. 60 tablet 0   Cetirizine HCl (ZYRTEC ALLERGY PO) Take by mouth.     Cholecalciferol (VITAMIN D-3 PO) Take by mouth.     Cyanocobalamin (VITAMIN B-12 PO) Take by mouth.     fenofibrate (TRICOR) 145 MG tablet Take 1 tablet (145 mg total) by mouth daily. 90 tablet 1   gabapentin (NEURONTIN) 100 MG capsule Take 1 capsule (100 mg total) by mouth 3 (three) times daily. 90 capsule 1   levothyroxine (SYNTHROID) 25 MCG tablet Take 1 tablet (25 mcg total) by mouth daily. 90 tablet 3   Lurasidone HCl (LATUDA) 120 MG TABS Take 1 tablet (120 mg total) by mouth every evening. Take with food 30 tablet 1   metFORMIN (GLUCOPHAGE) 500 MG tablet Take 1 tablet (500 mg total) by mouth daily with breakfast. TAKE 1 TABLET(500 MG) BY MOUTH DAILY WITH BREAKFAST 90 tablet 0   propranolol (INDERAL) 10 MG tablet Take 1 tablet (10 mg total) by mouth daily. 30 tablet 1   Semaglutide-Weight Management (WEGOVY) 0.5 MG/0.5ML SOAJ Inject 0.5 mg into the skin once a week. 2 mL 0   traZODone (DESYREL) 50 MG tablet Take 1 tablet (50 mg total) by mouth at bedtime. 30 tablet 0   No current facility-administered medications for this visit.     Psychiatric Specialty Exam: Review of Systems  Cardiovascular:  Negative for chest pain.  Neurological:  Negative for tremors.  Psychiatric/Behavioral:  Positive for sleep disturbance.     There were no vitals taken for this  visit.There is no height or weight on file to calculate BMI.  General Appearance: Casual  Eye Contact:  Fair  Speech:  Slow  Volume:  Decreased  Mood: better  Affect:  Congruent  Thought Process:  Goal Directed  Orientation:  Full (Time, Place, and Person)  Thought Content:  Rumination  Suicidal Thoughts:  No  Homicidal Thoughts:  No  Memory:  Immediate;   Fair  Judgement:  Fair  Insight:  Shallow  Psychomotor Activity:  Decreased  Concentration:  Concentration: Fair  Recall:  [employer] of Knowledge:Fair  Language: Fair  Akathisia:  No  Handed:    AIMS (if indicated):  no involuntary movements   Assets:  Desire for Improvement  ADL's:  Intact  Cognition: WNL  Sleep:  Fair   Screenings: AIMS    Flowsheet Row [facility] (Dis[facility]  AIMS Total Score 0      AUDIT    Flowsheet Row Appointment from 03/24/2023 in [facility] Ad[facility] (Discharged[facility] Admission (Discharged) from [facility]  Alcohol Use Disorder Identification Test Final Score (AUDIT) 5 2 3       GAD-7    Flowsheet Row Office Visit from 03/10/2023 in [facility] Video Visit from 09/14/2022 in [facility] Video Visit from 08/04/2022 in [facility] Video Visit from 06/16/2022 in [facility] Video Visit from 04/14/2022 in [facility]  Total GAD-7 Score 6 5 4 1 2       PHQ2-9    Flowsheet Row Office Visit from 11/04/2023 in [facility] Counselor from 10/13/2023 in [facility] at [facility] Office Visit from 03/10/2023 in [facility] at  [facility] Video Visit from 09/14/2022 in [facility] Video Visit from 08/04/2022 in [facility]  PHQ-2 Total Score 3 6 1 1 1   PHQ-9 Total Score 14 22 7  -- --      Flowsheet Row Video Visit from 01/26/2024 in [facility] at [facility] Office Visit from 11/04/2023 in [facility] at [facility] Counselor from 10/13/2023 in [facility] at [facility]  C-SSRS RISK CATEGORY No Risk Error: Q3, 4, or 5 should not be populated when Q2 is No Moderate Risk       Assessment and Plan: as follows   Prior documentation revieewd    Bipolar disorder current episode depressed :better mood, less withdrawn continue latuda, wellbutrin Discussed to get EKG by primary care Labs being monitored by PCP as well Continue sleep hygiene  Generalized anxiety; better can  lower gaba to bid but she wants to continue tid Denies palpitations, continue inderal one a day and after pcp visit if  need can lower to half a day   ADHD; manageable on adderall, denies side effects   Insomnia; see above, work on sleep hygiene and adjust meds as above. Seldom takes trazadone   Fu 3 m. Renewed meds which were due   Collaboration of Care: Psychiatrist [facility] providers chart reviewed   Patient/Guardian was advised  Release of Information must be obtained prior to any record release in order to collaborate their care with an outside provider. Patient/Guardian was advised if they have not already done so to contact the registration department to sign all necessary forms in order for [place] to release information regarding their care.   Consent: Patient/Guardian gives verbal consent for treatment and assignment of benefits for services provided during this visit. Patient/Guardian expressed understanding and agreed to proceed.   Thresa Ross,  MD 3/12/20251:09 PM

## 2024-03-08 ENCOUNTER — Ambulatory Visit (HOSPITAL_COMMUNITY): Payer: MEDICAID | Admitting: Licensed Clinical Social Worker

## 2024-03-08 DIAGNOSIS — F314 Bipolar disorder, current episode depressed, severe, without psychotic features: Secondary | ICD-10-CM | POA: Diagnosis not present

## 2024-03-08 DIAGNOSIS — G4700 Insomnia, unspecified: Secondary | ICD-10-CM | POA: Diagnosis not present

## 2024-03-08 DIAGNOSIS — F411 Generalized anxiety disorder: Secondary | ICD-10-CM

## 2024-03-08 DIAGNOSIS — F900 Attention-deficit hyperactivity disorder, predominantly inattentive type: Secondary | ICD-10-CM

## 2024-03-08 NOTE — Progress Notes (Signed)
 Virtual Visit via Video Note  I connected with [name] on 03/08/24 at  1:00 PM EDT by a video enabled telemedicine application and verified that I am speaking with the correct person using two identifiers.  Location: Patient: [facility] Provider: home office   I discussed the limitations of evaluation and management by telemedicine and the availability of in person appointments. The patient expressed understanding and agreed to proceed.   I discussed the assessment and treatment plan with the patient. The patient was provided an opportunity to ask questions and all were answered. The patient agreed with the plan and demonstrated an understanding of the instructions.   The patient was advised to call back or seek an in-person evaluation if the symptoms worsen or if the condition fails to improve as anticipated.  I provided 20 minutes of non-face-to-face time during this encounter.  THERAPIST PROGRESS NOTE  Session Time: 1:00 PM to 1:20 PM  Participation Level: Active  Behavioral Response: CasualAlertappropriate  Type of Therapy: Individual Therapy  Treatment Goals addressed: Work on decreasing depression, effective coping skills work on symptoms of trauma  ProgressTowards Goals: Progressing-patient said medication change has made a good difference depression better more active shorter session as patient did not have issues and doing good right now  Interventions: Solution Focused, Strength-based, Supportive, and Other: coping  Summary: CORTEZ STEELMAN is a 33 y.o. female who presents with doing really good. Since switched to Wellbutrin doing good.  Therapist asked when she was put on Wellbutrin and she said before our last meeting.  Therapist reviewed events catching up with patient had heard that her kids came for Christmas but was hard when they left and patient said it was she described them as the Littles and it was hard.  Therapist asked about court issues to be on top of  that. They have to wait until April submit child custody agreement will get every other weekend dinner on Tuesday for her oldest. Oldest with her sister. In 2021 she had a mental breakdown sister got her daughter thought it would be a good idea to be with her until patient got help. Turned into a huge thing her sister did not want her daughter to go back with her wanted custody so turned into court issue. Don't talk unless have to.  Explored how she felt it is a good thing therapist imagines would like more time she said yes we will do this for a while and see how it goes. See her every Sunday and goes well she is 13. When the little ones visitation when holidays are school out one is about to be 5 and one 6.  Therapist noted we were working on her being more active patient doing better has been doing crochet and reading some.  Therapist shared reading for her as well as good coping patient says likes to read. Doesn't have anything right now. Things are good.  She does not need a longer session.     Suicidal/Homicidal: No  Plan: 1.plan for every 2 weeks therapist booked out so put on cancellation list. 2.  Angst we can look at include AI overthinking emotional regulation AI 14 strategies for overthinking 3.work with insight cannot control thoughts but can control how we respond to thoughts   Diagnosis: Bipolar disorder current episode depressed severe without psychotic features ADHD primarily inattentive type, Generalized anxiety disorder, Insomnia Unspecified   Collaboration of Care: Medication Management AEB review of Dr. Gilmore Laroche note  Patient/Guardian was advised Release  of Information must be obtained prior to any record release in order to collaborate their care with an outside provider. Patient/Guardian was advised if they have not already done so to contact the registration department to sign all necessary forms in order for [place] to release information regarding their care.   Consent:  Patient/Guardian gives verbal consent for treatment and assignment of benefits for services provided during this visit. Patient/Guardian expressed understanding and agreed to proceed.   Coolidge Breeze, LCSW 03/08/2024

## 2024-03-30 ENCOUNTER — Encounter: Payer: Self-pay | Admitting: Nurse Practitioner

## 2024-04-02 ENCOUNTER — Telehealth: Payer: MEDICAID | Admitting: Family Medicine

## 2024-04-04 ENCOUNTER — Encounter: Payer: Self-pay | Admitting: Bariatrics

## 2024-04-04 ENCOUNTER — Ambulatory Visit: Payer: MEDICAID | Admitting: Bariatrics

## 2024-04-04 VITALS — BP 126/84 | HR 99 | Temp 98.3°F | Ht 72.0 in | Wt 275.0 lb

## 2024-04-04 DIAGNOSIS — E66812 Obesity, class 2: Secondary | ICD-10-CM

## 2024-04-04 DIAGNOSIS — R632 Polyphagia: Secondary | ICD-10-CM

## 2024-04-04 DIAGNOSIS — Z6837 Body mass index (BMI) 37.0-37.9, adult: Secondary | ICD-10-CM

## 2024-04-04 DIAGNOSIS — E669 Obesity, unspecified: Secondary | ICD-10-CM

## 2024-04-04 DIAGNOSIS — E282 Polycystic ovarian syndrome: Secondary | ICD-10-CM | POA: Diagnosis not present

## 2024-04-04 MED ORDER — WEGOVY 1 MG/0.5ML ~~LOC~~ SOAJ: 1.0000 mg | SUBCUTANEOUS | 0 refills | Status: DC

## 2024-04-04 NOTE — Progress Notes (Signed)
 WEIGHT SUMMARY AND BIOMETRICS  Weight Lost Since Last Visit: 14lb  Weight Gained Since Last Visit: 0   Vitals Temp: 98.3 F (36.8 C) BP: 126/84 Pulse Rate: 99 SpO2: 95 %   Anthropometric Measurements Height: 6' (1.829 m) Weight: 275 lb (124.7 kg) BMI (Calculated): 37.29 Weight at Last Visit: 289lb Weight Lost Since Last Visit: 14lb Weight Gained Since Last Visit: 0 Starting Weight: 288lb Total Weight Loss (lbs): 13 lb (5.897 kg)   Body Composition  Body Fat %: 46.9 % Fat Mass (lbs): 129.2 lbs Muscle Mass (lbs): 139 lbs Total Body Water (lbs): 98.4 lbs Visceral Fat Rating : 11   Other Clinical Data Fasting: no Labs: no Today's Visit #: 10 Starting Date: 04/06/23    OBESITY Wanda Torres is here to discuss her progress with her obesity treatment plan along with follow-up of her obesity related diagnoses.    Nutrition Plan: the Category 4 plan - 80% adherence.  Current exercise:  Strength training.  Interim History:  She is down 14 lbs since her last visit. She has had some minor hair loss.  Eating all of the food on the plan., Protein intake is less than prescribed., Is not skipping meals, Water intake is adequate., and Reports polyphagia   Pharmacotherapy: Wanda Torres is on Wegovy 0.50 mg SQ weekly Adverse side effects: None Hunger is moderately controlled.  Cravings are moderately controlled.  Assessment/Plan:   Wanda Torres endorses excessive hunger.  Medication(s): Wanda Torres Effects of medication:  moderately controlled. Cravings are moderately controlled.   Plan: Medication(s): Wegovy 1.0 mg SQ weekly Will increase water, protein and fiber to help assuage hunger.  Will minimize foods that have a high glucose index/load to minimize reactive hypoglycemia.   Polycystic Ovarian Syndrome (PCOS)  She has always had difficulty with her  weight. She is now taking Wegovy for weight loss.   Plan:  She will increase her protein and healthy fats.  Will decrease her carbohydrates (starches and sweets).   She will increase her protein, consider collagen, zinc and omega 3 supplementation for her hair loss.    Generalized Obesity: Current BMI BMI (Calculated): 37.29   Pharmacotherapy Plan Continue and increase dose  Wegovy 1.0 mg SQ weekly  Wanda Torres is currently in the action stage of change. As such, her goal is to continue with weight loss efforts.  She has agreed to the Category 4 plan. She will increase her protein to at least 120 grams per day, and ultimately even higher goals.   Exercise goals: For substantial health benefits, adults should do at least 150 minutes (2 hours and 30 minutes) a week of moderate-intensity, or 75 minutes (1 hour and 15 minutes) a week of vigorous-intensity aerobic physical activity, or an equivalent combination of moderate- and vigorous-intensity aerobic activity. Aerobic activity should be performed in episodes of at least 10 minutes, and preferably, it should be spread  throughout the week.  Behavioral modification strategies: increasing lean protein intake, no meal skipping, meal planning , increase water intake, better snacking choices, planning for success, keep healthy foods in the home, and weigh protein portions.  [name] has agreed to follow-up with our clinic in 4 weeks with Wanda Torres.     Objective:   VITALS: Per patient if applicable, see vitals. GENERAL: Alert and in no acute distress. CARDIOPULMONARY: No increased WOB. Speaking in clear sentences.  PSYCH: Pleasant and cooperative. Speech normal rate and rhythm. Affect is appropriate. Insight and judgement are appropriate. Attention is focused, linear, and appropriate.  NEURO: Oriented as arrived to appointment on time with no prompting.   Attestation Statements:   This was prepared with the assistance of Restaurant manager, fast food.  Occasional wrong-word or sound-a-like substitutions may have occurred due to the inherent limitations of voice recognition   Wanda Capra, DO

## 2024-04-05 ENCOUNTER — Encounter: Payer: Self-pay | Admitting: Family Medicine

## 2024-04-05 ENCOUNTER — Ambulatory Visit (INDEPENDENT_AMBULATORY_CARE_PROVIDER_SITE_OTHER): Payer: MEDICAID | Admitting: Family Medicine

## 2024-04-05 ENCOUNTER — Ambulatory Visit: Payer: MEDICAID

## 2024-04-05 ENCOUNTER — Telehealth (HOSPITAL_COMMUNITY): Payer: Self-pay | Admitting: *Deleted

## 2024-04-05 VITALS — BP 116/79 | HR 92 | Temp 98.1°F | Resp 18 | Ht 72.0 in | Wt 278.9 lb

## 2024-04-05 DIAGNOSIS — M545 Low back pain, unspecified: Secondary | ICD-10-CM

## 2024-04-05 DIAGNOSIS — E039 Hypothyroidism, unspecified: Secondary | ICD-10-CM

## 2024-04-05 MED ORDER — METHYLPREDNISOLONE 4 MG PO TBPK: ORAL_TABLET | ORAL | 0 refills | Status: DC

## 2024-04-05 MED ORDER — BACLOFEN 10 MG PO TABS: 10.0000 mg | ORAL_TABLET | Freq: Three times a day (TID) | ORAL | 0 refills | Status: DC | PRN

## 2024-04-05 NOTE — Progress Notes (Signed)
 Established Patient Office Visit  Subjective   Patient ID: Wanda Torres, female    DOB: 11-07-1991  Age: 33 y.o. MRN: [id number]  Chief Complaint  Patient presents with   Back Pain    Patient is here stating that she has had lower back pain for about 3 weeks,  she states that she can only sit or stand for about 20 minutes or so before the pain starts. She states that she doesn't believe she hurt her back .    Back Pain   Back pain No previous back issues other than a back strain 2 years ago. She reports this time, her back pain started hurting 3 weeks ago. She reports it hurts on both sides of her back above her buttocks. She says she was sitting on her couch when her back started hurting. Sitting for long periods of time makes the pain worse. She has tried Tylenol and Ibuprofen prn for the pain. Heating pad helped.  Hypothyroidism Pt with recently diagnosed Hypothyroidism 2 months ago. Started on Synthroid 25 mcg daily. She reports she's been taking consistently.    Review of Systems  Musculoskeletal:  Positive for back pain.  All other systems reviewed and are negative.     Objective:     There were no vitals taken for this visit. BP Readings from Last 3 Encounters:  04/05/24 116/79  04/04/24 126/84  02/29/24 114/74      Physical Exam Vitals and nursing note reviewed.  Constitutional:      Appearance: Normal appearance. She is normal weight.  HENT:     Head: Normocephalic and atraumatic.     Right Ear: External ear normal.     Left Ear: External ear normal.     Nose: Nose normal.     Mouth/Throat:     Mouth: Mucous membranes are moist.     Pharynx: Oropharynx is clear.  Eyes:     Conjunctiva/sclera: Conjunctivae normal.     Pupils: Pupils are equal, round, and reactive to light.  Cardiovascular:     Rate and Rhythm: Normal rate and regular rhythm.     Pulses: Normal pulses.     Heart sounds: Normal heart sounds.  Pulmonary:     Effort: Pulmonary  effort is normal.     Breath sounds: Normal breath sounds.  Abdominal:     General: Abdomen is flat. Bowel sounds are normal.  Musculoskeletal:        General: Normal range of motion.     Comments: Negative SLR bilaterally  Skin:    General: Skin is warm.     Capillary Refill: Capillary refill takes less than 2 seconds.  Neurological:     General: No focal deficit present.     Mental Status: She is alert and oriented to person, place, and time. Mental status is at baseline.  Psychiatric:        Mood and Affect: Mood normal.        Behavior: Behavior normal.        Thought Content: Thought content normal.        Judgment: Judgment normal.     No results found for any visits on 04/05/24.  Last thyroid functions Lab Results  Component Value Date   TSH 4.900 (H) 01/31/2024      The ASCVD Risk score (Arnett DK, et al., 2019) failed to calculate for the following reasons:   The 2019 ASCVD risk score is only valid for ages 56 to 12  Assessment & Plan:   Problem List Items Addressed This Visit   None Acute bilateral low back pain without sciatica -     DG Lumbar Spine Complete; Future -     DG Si Joints; Future -     methylPREDNISolone; 6-day pack as directed  Dispense: 21 tablet; Refill: 0 -     Baclofen; Take 1 tablet (10 mg total) by mouth 3 (three) times daily as needed for muscle spasms.  Dispense: 30 each; Refill: 0  Acquired hypothyroidism -     TSH -     T4, free   Pt with acute back pain. To send for xrays. Given back exercises to do. Continue heating pads, may add otc pain patches and do epsom salt water soaks. Send in medrol dose pack and Baclofen 10mg  TID prn for back pain.  For hypothyroidism, continue 25 mcg daily for now and to recheck Tsh/T4.   No follow-ups on file.    Suzan Slick, MD

## 2024-04-05 NOTE — Telephone Encounter (Signed)
 Patient Refill Request [id number] - [place], [place] - [address]   amphetamine-dextroamphetamine (ADDERALL XR) 20 MG 24 hr capsule   NEXT APPT  06/06/24 LAST APPT    03/07/24

## 2024-04-06 ENCOUNTER — Encounter: Payer: Self-pay | Admitting: Family Medicine

## 2024-04-06 LAB — TSH: TSH: 2.1 u[IU]/mL (ref 0.450–4.500)

## 2024-04-06 LAB — T4, FREE: Free T4: 1.39 ng/dL (ref 0.82–1.77)

## 2024-04-06 MED ORDER — AMPHETAMINE-DEXTROAMPHET ER 20 MG PO CP24: 20.0000 mg | ORAL_CAPSULE | Freq: Every day | ORAL | 0 refills | Status: DC

## 2024-04-06 NOTE — Addendum Note (Signed)
 Addended by: Thresa Ross on: 04/06/2024 08:48 AM   Modules accepted: Orders

## 2024-04-09 ENCOUNTER — Encounter: Payer: Self-pay | Admitting: Family Medicine

## 2024-04-09 DIAGNOSIS — M5136 Other intervertebral disc degeneration, lumbar region with discogenic back pain only: Secondary | ICD-10-CM

## 2024-04-09 DIAGNOSIS — M545 Low back pain, unspecified: Secondary | ICD-10-CM

## 2024-04-10 NOTE — Therapy (Deleted)
 OUTPATIENT PHYSICAL THERAPY THORACOLUMBAR EVALUATION   Patient Name: Wanda Torres MRN: [id number] DOB:Sep 29, 1991, 33 y.o., female Today's Date: 04/10/2024  END OF SESSION:   Past Medical History:  Diagnosis Date   Acne    ADD (attention deficit disorder)    Alcohol abuse    Allergy    Anemia    Anxiety    ASCUS with positive high risk human papillomavirus of vagina 05/2022   Bipolar 1 disorder (HCC)    Depression    Depression    Drug use    Hypothyroidism    Joint pain    PCOS (polycystic ovarian syndrome)    Psoriasis    Supervision of other high risk pregnancy, antepartum 01/19/2019   Formatting of this note might be different from the original. Partner prefers She/Her pronouns   Swelling of lower extremity    Vitamin B 12 deficiency    Vitamin D deficiency    Past Surgical History:  Procedure Laterality Date   COLPOSCOPY  07/2022   ENDOCERVICAL MUCOSA WITH CHRONIC CERVICITIS.   NO SQUAMOUS MUCOSA IDENTIFIED.   NO EVIDENCE OF DYSPLASIA OR MALIGNANCY.   Patient Active Problem List   Diagnosis Date Noted   Tachycardia 03/07/2024   Long term current use of antipsychotic medication 07/29/2023   BMI 39.0-39.9,adult 04/06/2023   B12 deficiency 04/06/2023   Hypertriglyceridemia 04/06/2023   Other fatigue 04/06/2023   Generalized anxiety disorder 05/28/2021   Bipolar disorder, current episode mixed, moderate (HCC) 03/26/2021   Mixed bipolar affective disorder (HCC) 03/26/2021   Insomnia 10/25/2020   S/P right knee surgery 01/02/2020   Rupture of anterior cruciate ligament of right knee 12/06/2019   Acute pain of right knee 06/06/2018   Attention deficit hyperactivity disorder (ADHD), 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  ONSET DATE: ***  SUBJECTIVE:                                                                                                                                                                                           SUBJECTIVE STATEMENT: ***  PERTINENT HISTORY:  ***  PAIN:  Are you having pain? Yes: NPRS scale: *** Pain location: *** Pain description: *** Aggravating factors: *** Relieving factors: ***  PRECAUTIONS: None  RED FLAGS: None   WEIGHT BEARING RESTRICTIONS: No  FALLS:  Has patient fallen in last 6 months? No  LIVING ENVIRONMENT: Lives with: {  [facility] lives with:25569::"lives with their family"} Lives in: {Lives in:25570} Stairs: {opstairs:27293} Has following equipment at home: {Assistive devices:23999}  OCCUPATION: ***  PLOF: Independent  PATIENT GOALS: ***  NEXT MD VISIT: ***  OBJECTIVE:  Note: Objective measures were completed at Evaluation unless otherwise noted.  DIAGNOSTIC FINDINGS:  Xrays 04/05/24: There is no evidence of lumbar spine or pelvic fracture. Scoliosis. Intervertebral disc spaces are maintained. Minimal anterior spurring noted at L4 and L2. Minimal degenerative joint changes with sclerosis at the inferior left sacroiliac joint.   IMPRESSION: 1. Minimal degenerative joint changes of left sacroiliac joint. 2. Minimal degenerative joint changes of lumbar spine.  PATIENT SURVEYS:  Modified Oswestry ***   COGNITION: Overall cognitive status: Within functional limits for tasks assessed     SENSATION: {sensation:27233}  MUSCLE LENGTH: Hamstrings: Right *** deg; Left *** deg Andy Bannister test: Right *** deg; Left *** deg  POSTURE: {posture:25561}  PALPATION: ***  LUMBAR ROM:   AROM eval  Flexion   Extension   Right lateral flexion   Left lateral flexion   Right rotation   Left rotation    (Blank rows = not tested)  LOWER EXTREMITY ROM:     Active  Right eval Left eval  Hip flexion    Hip extension     Hip abduction    Hip adduction    Hip internal rotation    Hip external rotation    Knee flexion    Knee extension    Ankle dorsiflexion    Ankle plantarflexion    Ankle inversion    Ankle eversion     (Blank rows = not tested)  LOWER EXTREMITY MMT:    MMT Right eval Left eval  Hip flexion    Hip extension    Hip abduction    Hip adduction    Hip internal rotation    Hip external rotation    Knee flexion    Knee extension    Ankle dorsiflexion    Ankle plantarflexion    Ankle inversion    Ankle eversion     (Blank rows = not tested)  LUMBAR SPECIAL TESTS:  Straight leg raise test: {pos/neg:25243} and Slump test: {pos/neg:25243}  FUNCTIONAL TESTS:  5 times sit to stand: ***  GAIT: Distance walked: 40 ft Assistive device utilized: {Assistive devices:23999} Level of assistance: {Levels of assistance:24026} Comments: ***  TREATMENT DATE: ***                                                                                                                                 PATIENT EDUCATION:  Education details: POC; HEP  Person educated: Patient Education method: Programmer, multimedia, Facilities manager, Actor cues, Verbal cues, and Handouts Education comprehension: verbalized understanding, returned demonstration, verbal cues required, tactile cues required, and needs further education  HOME EXERCISE PROGRAM: ***  ASSESSMENT:  CLINICAL IMPRESSION: Patient is a 33 y.o. female who was seen today for physical therapy evaluation and treatment for  acute bilat LBP.   OBJECTIVE IMPAIRMENTS: {opptimpairments:25111}.   ACTIVITY LIMITATIONS: {activitylimitations:27494}  PARTICIPATION LIMITATIONS: {participationrestrictions:25113}  PERSONAL FACTORS: {Personal factors:25162} are also affecting patient's functional outcome.   REHAB POTENTIAL: Good  CLINICAL DECISION MAKING: Evolving/moderate complexity  EVALUATION COMPLEXITY: Moderate   GOALS: Goals reviewed with patient?  Yes  SHORT TERM GOALS: Target date: ***  Independent in initial HEP  Baseline: Goal status: INITIAL  2.  *** Baseline:  Goal status: INITIAL  3.  *** Baseline:  Goal status: INITIAL   LONG TERM GOALS: Target date: ***  *** Baseline:  Goal status: INITIAL  2.  *** Baseline:  Goal status: INITIAL  3.  *** Baseline:  Goal status: INITIAL  4.  *** Baseline:  Goal status: INITIAL  5.  *** Baseline:  Goal status: INITIAL  6.  *** Baseline:  Goal status: INITIAL  PLAN:  PT FREQUENCY: 2x/week  PT DURATION: 8 weeks  PLANNED INTERVENTIONS: 97110-Therapeutic exercises, 97530- Therapeutic activity, [id number]- Neuromuscular re-education, 97535- Self Care, 09811- Manual therapy, [id number]- Gait training, [phone]- Aquatic Therapy, Patient/Family education, Balance training, Taping, Dry Needling, and Joint mobilization.  PLAN FOR NEXT SESSION: review and progress with exercises; continue back care education; manual work and modalities as indicated    Stanely Sexson P Carold Eisner, PT 04/10/2024, 8:32 PM

## 2024-04-11 ENCOUNTER — Ambulatory Visit: Payer: MEDICAID | Admitting: Rehabilitative and Restorative Service Providers"

## 2024-04-17 ENCOUNTER — Ambulatory Visit: Payer: MEDICAID | Attending: Family Medicine | Admitting: Rehabilitative and Restorative Service Providers"

## 2024-04-17 ENCOUNTER — Encounter: Payer: Self-pay | Admitting: Rehabilitative and Restorative Service Providers"

## 2024-04-17 ENCOUNTER — Other Ambulatory Visit: Payer: Self-pay

## 2024-04-17 DIAGNOSIS — M6281 Muscle weakness (generalized): Secondary | ICD-10-CM | POA: Insufficient documentation

## 2024-04-17 DIAGNOSIS — M545 Low back pain, unspecified: Secondary | ICD-10-CM | POA: Diagnosis not present

## 2024-04-17 DIAGNOSIS — M5136 Other intervertebral disc degeneration, lumbar region with discogenic back pain only: Secondary | ICD-10-CM | POA: Insufficient documentation

## 2024-04-17 DIAGNOSIS — M5459 Other low back pain: Secondary | ICD-10-CM | POA: Diagnosis present

## 2024-04-17 DIAGNOSIS — R29898 Other symptoms and signs involving the musculoskeletal system: Secondary | ICD-10-CM | POA: Insufficient documentation

## 2024-04-17 NOTE — Therapy (Addendum)
 OUTPATIENT PHYSICAL THERAPY THORACOLUMBAR EVALUATION   Patient Name: Wanda Torres MRN: [id number] DOB:20-May-1991, 33 y.o., female Today's Date: 04/17/2024  END OF SESSION:  PT End of Session - 04/17/24 1028     Visit Number 1    Number of Visits 16    Date for PT Re-Evaluation 06/12/24    Authorization Type partners tailored plan auth required copay $4    PT Start Time 0930    PT Stop Time 1018    PT Time Calculation (min) 48 min    Activity Tolerance Patient tolerated treatment well             Past Medical History:  Diagnosis Date   Acne    ADD (attention deficit disorder)    Alcohol abuse    Allergy    Anemia    Anxiety    ASCUS with positive high risk human papillomavirus of vagina 05/2022   Bipolar 1 disorder (HCC)    Depression    Depression    Drug use    Hypothyroidism    Joint pain    PCOS (polycystic ovarian syndrome)    Psoriasis    Supervision of other high risk pregnancy, antepartum 01/19/2019   Formatting of this note might be different from the original. Partner prefers She/Her pronouns   Swelling of lower extremity    Vitamin B 12 deficiency    Vitamin D  deficiency    Past Surgical History:  Procedure Laterality Date   COLPOSCOPY  07/2022   ENDOCERVICAL MUCOSA WITH CHRONIC CERVICITIS.   NO SQUAMOUS MUCOSA IDENTIFIED.   NO EVIDENCE OF DYSPLASIA OR MALIGNANCY.   Patient Active Problem List   Diagnosis Date Noted   Tachycardia 03/07/2024   Long term current use of antipsychotic medication 07/29/2023   BMI 39.0-39.9,adult 04/06/2023   B12 deficiency 04/06/2023   Hypertriglyceridemia 04/06/2023   Other fatigue 04/06/2023   Generalized anxiety disorder 05/28/2021   Bipolar disorder, current episode mixed, moderate (HCC) 03/26/2021   Mixed bipolar affective disorder (HCC) 03/26/2021   Insomnia 10/25/2020   S/P right knee surgery 01/02/2020   Rupture of anterior cruciate ligament of right knee 12/06/2019   Acute pain of right knee  06/06/2018   Attention deficit hyperactivity disorder (ADHD), predominantly inattentive type 10/17/2016   PCOS (polycystic ovarian syndrome) 03/26/2016    PCP: Dr Manette Section   REFERRING PROVIDER:  Dr Manette Section  REFERRING DIAG: Acute bilat LBP  Rationale for Evaluation and Treatment: Rehabilitation  THERAPY DIAG:  Other low back pain  Other symptoms and signs involving the musculoskeletal system  Muscle weakness (generalized)  ONSET DATE: 03/10/24  SUBJECTIVE:  SUBJECTIVE STATEMENT: Patient reports that she has had LBP over the past 3-4 weeks with no known injury. It was difficult to sit or walk. She has noticed improvement with prednisone in the past week to 10 days. She continues to have some pain with sitting in higher chairs or sitting > 1 hour on the couch.    PERTINENT HISTORY:  Thyroid disorder; denies any back problems   PAIN:  Are you having pain? Yes: NPRS scale: 0/10; 4/10 in the past week  Pain location: bilat LB  Pain description: stabbing; radiating  Aggravating factors: sitting; driving; walking > 5 min  Relieving factors: meds; heat   PRECAUTIONS: None  RED FLAGS: None   WEIGHT BEARING RESTRICTIONS: No  FALLS:  Has patient fallen in last 6 months? 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  DIAGNOSTIC FINDINGS:  Xrays 04/05/24: There is no evidence of lumbar spine or pelvic fracture. Scoliosis. Intervertebral disc spaces are maintained. Minimal anterior spurring noted at L4 and L2. Minimal degenerative joint changes with sclerosis at the inferior left sacroiliac joint.   IMPRESSION: 1. Minimal degenerative joint changes of left sacroiliac joint. 2. Minimal degenerative joint changes of lumbar spine.  PATIENT SURVEYS:  Modified Oswestry 20/50; 40%    COGNITION: Overall cognitive status: Within functional limits for tasks assessed     SENSATION: WFL  MUSCLE LENGTH: Hamstrings: Right 75 deg; Left 80 deg Thomas test: Right tighter > Left   POSTURE: rounded shoulders, forward head, flexed trunk , and minimal scoliosis noted in standing   PALPATION: Pain with spring testing sacrum, lower lumbar and bilat SI joints   LUMBAR ROM:   AROM eval  Flexion 45% pain  Extension 45% pain   Right lateral flexion 65% pull L  Left lateral flexion 65% pull R   Right rotation 40% pull L  Left rotation 40% pull R   (Blank rows = not tested)  LOWER EXTREMITY ROM:     Active  Right eval Left eval  Hip flexion Tight  Tight   Hip extension    Hip abduction    Hip adduction    Hip internal rotation Tight    Hip external rotation Tight    Knee flexion    Knee extension    Ankle dorsiflexion    Ankle plantarflexion    Ankle inversion    Ankle eversion     (Blank rows = not tested)  LOWER EXTREMITY MMT:      Patient demonstrates drop of opposite hip with SLS on R/L   MMT Right eval Left eval  Hip flexion    Hip extension 4 4+  Hip abduction 4- 4  Hip adduction    Hip internal rotation    Hip external rotation    Knee flexion    Knee extension    Ankle dorsiflexion    Ankle plantarflexion    Ankle  inversion     Ankle eversion     (Blank rows = not tested)  LUMBAR SPECIAL TESTS:  Straight leg raise test: Negative and Slump test: Negative  FUNCTIONAL TESTS:   SLS - 10 sec R/L   Heel raise/toe raise bilat without difficulty Sit to stand without use of UEs - no difficulty and no pain    GAIT: Distance walked: 40 ft Assistive device utilized: None Level of assistance: Complete Independence Comments: flexed through upper body; LEs in ER with gait   TREATMENT DATE: 04/17/24 Review of pathology and physical findings Ergonomic recommendations  HEP - see below                                                                                                                                  PATIENT EDUCATION:  Education details: POC; HEP  Person educated: Patient Education method: Programmer, multimedia, Demonstration, Tactile cues, Verbal cues, and Handouts Education comprehension: verbalized understanding, returned demonstration, verbal cues required, tactile cues required, and needs further education  HOME EXERCISE PROGRAM: Access Code: [email or web address] URL: [email or web address] Date: 04/17/2024 Prepared by: Makyiah Lie  Exercises - Prone Press Up  - 2 x daily - 7 x weekly - 1 sets - 10 reps - 2-3 sec  hold - Prone Press Up On Elbows  - 2 x daily - 7 x weekly - 1 sets - 3 reps - 30 sec  hold - Standing Lumbar Extension  - 2 x daily - 7 x weekly - 1 sets - 2-3 reps - 2-3 sec  hold - Supine Transversus Abdominis Bracing with Pelvic Floor Contraction  - 2 x daily - 7 x weekly - 1 sets - 10 reps - 10sec  hold - Supine Piriformis Stretch with Leg Straight  - 2 x daily - 7 x weekly - 1 sets - 3 reps - 30 sec  hold - Sidelying Hip Abduction  - 1 x daily - 7 x weekly - 3 sets - 10 reps - 3-5 sec  hold - Hip Flexor Stretch at Edge of Bed  - 2 x daily - 7 x weekly - 1 sets - 3 reps - 30 sec  hold - Standing Piriformis Release with Ball at Wall  - 2 x daily - 7 x weekly - 30-60 sec   hold  ASSESSMENT:  CLINICAL IMPRESSION: Patient is a 33 y.o. female who was seen today for physical therapy evaluation and treatment for acute bilat LBP. She presents with bilat LBP with no radicular symptoms present for ~ 4 weeks with no known injury. Symptoms are resolving with cortisone dose pac. She continues to have pain with prolonged sitting; standing; walking. Patient has poor posture and alignment; limited trunk and LE mobility/ROM; core and LE weakness especially noted in hip abductors; poor sitting positions at home; pain limiting functional activities. Patient will benefit from PT to address problems identified.   OBJECTIVE IMPAIRMENTS: Abnormal gait, decreased activity tolerance,  decreased ROM, decreased strength, increased fascial restrictions, impaired flexibility, improper body mechanics, postural dysfunction, and pain.   ACTIVITY LIMITATIONS: carrying, lifting, sitting, standing, and locomotion level  PARTICIPATION LIMITATIONS: meal prep, cleaning, laundry, driving, shopping, and community activity  PERSONAL FACTORS: Fitness and Time since onset of injury/illness/exacerbation are also affecting patient's functional outcome.   REHAB POTENTIAL: Good  CLINICAL DECISION MAKING: Evolving/moderate complexity  EVALUATION COMPLEXITY: Moderate   GOALS: Goals reviewed with patient? Yes  SHORT TERM GOALS: Target date: 05/15/2024   Independent in initial HEP  Baseline: Goal status: INITIAL  2.  Patient demonstrates and reports proper standing and sitting posture and alignment; modifying recliner position for rest  Baseline:  Goal status: INITIAL  3.  Improve core, patient to return to training with personal trainer with no increase in pain  Baseline:  Goal status: INITIAL   LONG TERM GOALS: Target date: 06/12/2024   Patient reports return to all normal functional and recreational activities without pain Baseline:  Goal status: INITIAL  2.  Pain level 0/10 with no  greater that 1-2/10 with irritating activities (prolonged sitting, standing, walking  Baseline:  Goal status: INITIAL  3.  Hip strength 5/5 bilat  Baseline:  Goal status: INITIAL  4.  Independent in HEP including return to gym program three days/wk  Baseline:  Goal status: INITIAL  5.  Improve Owestry score by 10-15 points   Baseline: 20/50; 40% Goal status: INITIAL    PLAN:  PT FREQUENCY: 2x/week  PT DURATION: 8 weeks  PLANNED INTERVENTIONS: 97110-Therapeutic exercises, 97530- Therapeutic activity, [id number]- Neuromuscular re-education, 97535- Self Care, [id number]- Manual therapy, [phone]- Gait training, [phone]- Aquatic Therapy, Patient/Family education, Balance training, Taping, Dry Needling, and Joint mobilization.  PLAN FOR NEXT SESSION: review and progress with exercises; continue back care education; manual work and modalities as indicated    Artia Singley P Jagar Lua, PT 04/17/2024, 10:43 AM   For all possible CPT codes, reference the Planned Interventions line above.     Check all conditions that are expected to impact treatment: {Conditions expected to impact treatment:Morbid obesity   If treatment provided at initial evaluation, no treatment charged due to lack of authorization.

## 2024-04-23 ENCOUNTER — Ambulatory Visit: Payer: MEDICAID

## 2024-04-25 ENCOUNTER — Encounter (HOSPITAL_COMMUNITY): Payer: Self-pay | Admitting: Licensed Clinical Social Worker

## 2024-04-25 ENCOUNTER — Ambulatory Visit (HOSPITAL_COMMUNITY): Payer: MEDICAID | Admitting: Licensed Clinical Social Worker

## 2024-04-25 DIAGNOSIS — G4700 Insomnia, unspecified: Secondary | ICD-10-CM

## 2024-04-25 DIAGNOSIS — F314 Bipolar disorder, current episode depressed, severe, without psychotic features: Secondary | ICD-10-CM | POA: Diagnosis not present

## 2024-04-25 DIAGNOSIS — F900 Attention-deficit hyperactivity disorder, predominantly inattentive type: Secondary | ICD-10-CM

## 2024-04-25 DIAGNOSIS — F411 Generalized anxiety disorder: Secondary | ICD-10-CM

## 2024-04-25 NOTE — Progress Notes (Signed)
 Virtual Visit via Video Note  I connected with Wanda Torres on 04/25/24 at  3:00 PM EDT by a video enabled telemedicine application and verified that I am speaking with the correct person using two identifiers.  Location: Patient: home Provider: home office   I discussed the limitations of evaluation and management by telemedicine and the availability of in person appointments. The patient expressed understanding and agreed to proceed.   I discussed the assessment and treatment plan with the patient. The patient was provided an opportunity to ask questions and all were answered. The patient agreed with the plan and demonstrated an understanding of the instructions.   The patient was advised to call back or seek an in-person evaluation if the symptoms worsen or if the condition fails to improve as anticipated.  I provided 20 minutes of non-face-to-face time during this encounter.  THERAPIST PROGRESS NOTE  Session Time: 3:00 PM to 3:20 PM  Participation Level: Active  Behavioral Response: CasualAlertEuthymic  Type of Therapy: Individual Therapy  Treatment Goals addressed: Work on decreasing depression, effective coping skills work on symptoms of trauma  ProgressTowards Goals: Progressing-patient has not had any issues for a while doing good shared positive news we will discontinue therapy for now with successful discharge patient needs to come back she can always come back  Interventions: Solution Focused, Strength-based, Supportive, and Other: coping  Summary: Wanda Torres is a 33 y.o. female who presents with doing really good.  Therapist the reason and it is because medication was a big deal. All the court stuff is done and now planning wedding. Not this year but next year October. Will be at [facility] nice venue. Thinks are good with kids on assigned days.  No issues therapist really happy for patient that things are in a good place for her.  We were to review  treatment plan patient says can stop for now if need to she can come back therapist noted this will be a successful discharge as she has not had issues for awhile.  Shorter system but also enthusiastic for progress and for positive things in patient's life..   Suicidal/Homicidal: No  Plan: 1.patient continues to have no issues so we are discontinuing therapy if she needs to come back she can always come back and reconnect with therapist   Diagnosis: Bipolar disorder current episode depressed severe without psychotic features ADHD primarily inattentive type, Generalized anxiety disorder, Insomnia Unspecified  Collaboration of Care: Other none needed  Patient/Guardian was advised Release of Information must be obtained prior to any record release in order to collaborate their care with an outside provider. Patient/Guardian was advised if they have not already done so to contact the registration department to sign all necessary forms in order for us  to release information regarding their care.   Consent: Patient/Guardian gives verbal consent for treatment and assignment of benefits for services provided during this visit. Patient/Guardian expressed understanding and agreed to proceed.   Dallie Duel, LCSW 04/25/2024

## 2024-04-26 ENCOUNTER — Ambulatory Visit: Payer: MEDICAID | Attending: Family Medicine

## 2024-04-26 DIAGNOSIS — R29898 Other symptoms and signs involving the musculoskeletal system: Secondary | ICD-10-CM | POA: Diagnosis present

## 2024-04-26 DIAGNOSIS — M6281 Muscle weakness (generalized): Secondary | ICD-10-CM | POA: Diagnosis present

## 2024-04-26 DIAGNOSIS — M5459 Other low back pain: Secondary | ICD-10-CM | POA: Diagnosis present

## 2024-04-26 NOTE — Therapy (Addendum)
 OUTPATIENT PHYSICAL THERAPY THORACOLUMBAR TREATMENT PHYSICAL THERAPY DISCHARGE SUMMARY  Visits from Start of Care: 2  Current functional level related to goals / functional outcomes: See progress note for discharge status    Remaining deficits: Needs to continue core strengthening    Education / Equipment: HEP   Patient agrees to discharge. Patient goals were met. Patient is being discharged due to meeting the stated rehab goals.  Celyn P. Geraldo Klippel PT, MPH 04/26/24 12:50 PM   Patient Name: Wanda Torres MRN: [id number] DOB:08-May-1991, 33 y.o., female Today's Date: 04/26/2024  END OF SESSION:  PT End of Session - 04/26/24 1148     Visit Number 2    Number of Visits 16    Date for PT Re-Evaluation 06/12/24    Authorization Type partners tailored plan auth required copay $4    PT Start Time 1148    PT Stop Time 1200   discharge   PT Time Calculation (min) 12 min    Activity Tolerance Patient tolerated treatment well    Behavior During Therapy [facility] for tasks assessed/performed             Past Medical History:  Diagnosis Date   Acne    ADD (attention deficit disorder)    Alcohol abuse    Allergy    Anemia    Anxiety    ASCUS with positive high risk human papillomavirus of vagina 05/2022   Bipolar 1 disorder (HCC)    Depression    Depression    Drug use    Hypothyroidism    Joint pain    PCOS (polycystic ovarian syndrome)    Psoriasis    Supervision of other high risk pregnancy, antepartum 01/19/2019   Formatting of this note might be different from the original. Partner prefers She/Her pronouns   Swelling of lower extremity    Vitamin B 12 deficiency    Vitamin D  deficiency    Past Surgical History:  Procedure Laterality Date   COLPOSCOPY  07/2022   ENDOCERVICAL MUCOSA WITH CHRONIC CERVICITIS.   NO SQUAMOUS MUCOSA IDENTIFIED.   NO EVIDENCE OF DYSPLASIA OR MALIGNANCY.   Patient Active Problem List   Diagnosis Date Noted   Tachycardia 03/07/2024    Long term current use of antipsychotic medication 07/29/2023   BMI 39.0-39.9,adult 04/06/2023   B12 deficiency 04/06/2023   Hypertriglyceridemia 04/06/2023   Other fatigue 04/06/2023   Generalized anxiety disorder 05/28/2021   Bipolar disorder, current episode mixed, moderate (HCC) 03/26/2021   Mixed bipolar affective disorder (HCC) 03/26/2021   Insomnia 10/25/2020   S/P right knee surgery 01/02/2020   Rupture of anterior cruciate ligament of right knee 12/06/2019   Acute pain of right knee 06/06/2018   Attention deficit hyperactivity disorder (ADHD), predominantly inattentive type 10/17/2016   PCOS (polycystic ovarian syndrome) 03/26/2016    PCP: Dr Manette Section   REFERRING PROVIDER:  Dr Manette Section  REFERRING DIAG: Acute bilat LBP  Rationale for Evaluation and Treatment: Rehabilitation  THERAPY DIAG:  Other low back pain  Other symptoms and signs involving the musculoskeletal system  Muscle weakness (generalized)  ONSET DATE: 03/10/24  SUBJECTIVE:  SUBJECTIVE STATEMENT: Patient has been off prednisone for a week and has had no significant back pain; states she had mild LBP 3 days ago but thinks it was due to the chair she was sitting in.   PERTINENT HISTORY:  Thyroid disorder; denies any back problems   PAIN:  Are you having pain? Yes: NPRS scale: 0/10; 4/10 in the past week  Pain location: bilat LB  Pain description: stabbing; radiating  Aggravating factors: sitting; driving; walking > 5 min  Relieving factors: meds; heat   PRECAUTIONS: None  RED FLAGS: None   WEIGHT BEARING RESTRICTIONS: No  FALLS:  Has patient fallen in last 6 months? 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  DIAGNOSTIC FINDINGS:  Xrays 04/05/24: There is no evidence of lumbar spine or pelvic fracture. Scoliosis. Intervertebral disc spaces are maintained. Minimal anterior spurring noted at L4 and L2. Minimal degenerative joint changes with sclerosis at the inferior left sacroiliac joint.   IMPRESSION: 1. Minimal degenerative joint changes of left sacroiliac joint. 2. Minimal degenerative joint changes of lumbar spine.  PATIENT SURVEYS:  Modified Oswestry 20/50; 40%    COGNITION: Overall cognitive status: Within functional limits for tasks assessed     SENSATION: WFL  MUSCLE LENGTH: Hamstrings: Right 75 deg; Left 80 deg Thomas test: Right tighter > Left   POSTURE: rounded shoulders, forward head, flexed trunk , and minimal scoliosis noted in standing   PALPATION: Pain with spring testing sacrum, lower lumbar and bilat SI joints   LUMBAR ROM:   AROM eval  Flexion 45% pain  Extension 45% pain   Right lateral flexion 65% pull L  Left lateral flexion 65% pull R   Right rotation 40% pull L  Left rotation 40% pull R   (Blank rows = not tested)  LOWER EXTREMITY ROM:     Active  Right eval Left eval  Hip flexion Tight  Tight   Hip extension    Hip abduction    Hip adduction    Hip internal rotation Tight    Hip external rotation Tight    Knee flexion    Knee extension    Ankle dorsiflexion    Ankle plantarflexion    Ankle inversion    Ankle  eversion     (Blank rows = not tested)  LOWER EXTREMITY MMT:      Patient demonstrates drop of opposite hip with SLS on R/L   MMT Right eval Left eval Right 04/26/24 Left 5/1//25  Hip flexion   5 5  Hip extension 4 4+ 5 5  Hip abduction 4- 4 5 5   Hip adduction      Hip internal rotation      Hip external rotation      Knee flexion      Knee extension      Ankle dorsiflexion      Ankle plantarflexion  Ankle inversion      Ankle eversion       (Blank rows = not tested)  LUMBAR SPECIAL TESTS:  Straight leg raise test: Negative and Slump test: Negative  FUNCTIONAL TESTS:   SLS - 10 sec R/L   Heel raise/toe raise bilat without difficulty Sit to stand without use of UEs - no difficulty and no pain    GAIT: Distance walked: 40 ft Assistive device utilized: None Level of assistance: Complete Independence Comments: flexed through upper body; LEs in ER with gait    [facility] Adult PT Treatment:                                                DATE: 04/26/2024 Therapeutic Exercise: Reviewed STG & LTG --> has met everything   TREATMENT DATE: 04/17/24 Review of pathology and physical findings Ergonomic recommendations  HEP - see below                                                                                                                                  PATIENT EDUCATION:  Education details: POC; HEP  Person educated: Patient Education method: Programmer, multimedia, Demonstration, Tactile cues, Verbal cues, and Handouts Education comprehension: verbalized understanding, returned demonstration, verbal cues required, tactile cues required, and needs further education  HOME EXERCISE PROGRAM: Access Code: [email or web address] URL: [email or web address] Date: 04/17/2024 Prepared by: Celyn Holt  Exercises - Prone Press Up  - 2 x daily - 7 x weekly - 1 sets - 10 reps - 2-3 sec  hold - Prone Press Up On Elbows  - 2 x daily - 7 x weekly - 1 sets - 3 reps - 30 sec  hold - Standing  Lumbar Extension  - 2 x daily - 7 x weekly - 1 sets - 2-3 reps - 2-3 sec  hold - Supine Transversus Abdominis Bracing with Pelvic Floor Contraction  - 2 x daily - 7 x weekly - 1 sets - 10 reps - 10sec  hold - Supine Piriformis Stretch with Leg Straight  - 2 x daily - 7 x weekly - 1 sets - 3 reps - 30 sec  hold - Sidelying Hip Abduction  - 1 x daily - 7 x weekly - 3 sets - 10 reps - 3-5 sec  hold - Hip Flexor Stretch at Edge of Bed  - 2 x daily - 7 x weekly - 1 sets - 3 reps - 30 sec  hold - Standing Piriformis Release with Ball at [employer]  - 2 x daily - 7 x weekly - 30-60 sec  hold  ASSESSMENT:  CLINICAL IMPRESSION: Patient has been pain-free for one week since finishing prednisone. Patient has met all short- term and long-term goals in POC and has returned to working with personal trainer 3 x week  with no pain. Patient is in agreement to discharge from PT.    OBJECTIVE IMPAIRMENTS: Abnormal gait, decreased activity tolerance, decreased ROM, decreased strength, increased fascial restrictions, impaired flexibility, improper body mechanics, postural dysfunction, and pain.   ACTIVITY LIMITATIONS: carrying, lifting, sitting, standing, and locomotion level  PARTICIPATION LIMITATIONS: meal prep, cleaning, laundry, driving, shopping, and community activity  PERSONAL FACTORS: Fitness and Time since onset of injury/illness/exacerbation are also affecting patient's functional outcome.   REHAB POTENTIAL: Good  CLINICAL DECISION MAKING: Evolving/moderate complexity  EVALUATION COMPLEXITY: Moderate   GOALS: Goals reviewed with patient? Yes  SHORT TERM GOALS: Target date: 05/15/2024  Independent in initial HEP  Baseline: Goal status: INITIAL  2.  Patient demonstrates and reports proper standing and sitting posture and alignment; modifying recliner position for rest  Baseline:  Goal status: MET  3.  Improve core, patient to return to training with personal trainer with no increase in pain   Baseline:  Goal status: MET   LONG TERM GOALS: Target date: 06/12/2024   Patient reports return to all normal functional and recreational activities without pain Baseline:  Goal status: MET  2.  Pain level 0/10 with no greater that 1-2/10 with irritating activities (prolonged sitting, standing, walking  Baseline:  Goal status: MET  3.  Hip strength 5/5 bilat  Baseline:  Goal status: MET  4.  Independent in HEP including return to gym program three days/wk  Baseline:  Goal status: MET  5.  Improve Owestry score by 10-15 points   Baseline: 20/50; 40% 04/26/24: 1/50 = 2% Goal status: MET    PLAN:  PT FREQUENCY: 2x/week  PT DURATION: 8 weeks  PLANNED INTERVENTIONS: 97110-Therapeutic exercises, 97530- Therapeutic activity, 97112- Neuromuscular re-education, 97535- Self Care, 16109- Manual therapy, [phone]- Gait training, [phone]- Aquatic Therapy, Patient/Family education, Balance training, Taping, Dry Needling, and Joint mobilization.  PLAN FOR NEXT SESSION: Discharge  Flint Hummer, PTA 04/26/2024, 12:08 PM

## 2024-04-27 ENCOUNTER — Telehealth (HOSPITAL_COMMUNITY): Payer: Self-pay | Admitting: *Deleted

## 2024-04-27 DIAGNOSIS — F314 Bipolar disorder, current episode depressed, severe, without psychotic features: Secondary | ICD-10-CM

## 2024-04-27 MED ORDER — BUPROPION HCL ER (SR) 100 MG PO TB12: 100.0000 mg | ORAL_TABLET | Freq: Two times a day (BID) | ORAL | 1 refills | Status: DC

## 2024-04-27 NOTE — Telephone Encounter (Signed)
 PATIENT REQUEST [facility] [phone] - [place], [place] - [address]   buPROPion  ER (WELLBUTRIN  SR) 100 MG 12 hr tablet    NEXT APPT  06/06/24 LAST APPT    03/07/24

## 2024-04-27 NOTE — Addendum Note (Signed)
 Addended by: Wray Heady on: 04/27/2024 10:43 AM   Modules accepted: Orders

## 2024-05-02 ENCOUNTER — Encounter: Payer: Self-pay | Admitting: Nurse Practitioner

## 2024-05-02 ENCOUNTER — Ambulatory Visit (INDEPENDENT_AMBULATORY_CARE_PROVIDER_SITE_OTHER): Payer: MEDICAID | Admitting: Nurse Practitioner

## 2024-05-02 ENCOUNTER — Telehealth (HOSPITAL_COMMUNITY): Payer: Self-pay | Admitting: *Deleted

## 2024-05-02 VITALS — BP 104/70 | HR 98 | Temp 98.0°F | Ht 72.0 in | Wt 274.0 lb

## 2024-05-02 DIAGNOSIS — Z6837 Body mass index (BMI) 37.0-37.9, adult: Secondary | ICD-10-CM | POA: Diagnosis not present

## 2024-05-02 DIAGNOSIS — F314 Bipolar disorder, current episode depressed, severe, without psychotic features: Secondary | ICD-10-CM

## 2024-05-02 DIAGNOSIS — E66812 Obesity, class 2: Secondary | ICD-10-CM | POA: Diagnosis not present

## 2024-05-02 DIAGNOSIS — L659 Nonscarring hair loss, unspecified: Secondary | ICD-10-CM | POA: Diagnosis not present

## 2024-05-02 DIAGNOSIS — F411 Generalized anxiety disorder: Secondary | ICD-10-CM

## 2024-05-02 MED ORDER — WEGOVY 1.7 MG/0.75ML ~~LOC~~ SOAJ: 1.7000 mg | SUBCUTANEOUS | 0 refills | Status: DC

## 2024-05-02 NOTE — Progress Notes (Signed)
 Office: [phone]  /  Fax: [phone]  WEIGHT SUMMARY AND BIOMETRICS  Weight Lost Since Last Visit: 1lb  Weight Gained Since Last Visit: 0lb   Vitals Temp: 98 F (36.7 C) BP: 104/70 Pulse Rate: 98 SpO2: 100 %   Anthropometric Measurements Height: 6' (1.829 m) Weight: 274 lb (124.3 kg) BMI (Calculated): 37.15 Weight at Last Visit: 275lb Weight Lost Since Last Visit: 1lb Weight Gained Since Last Visit: 0lb Starting Weight: 288lb Total Weight Loss (lbs): 14 lb (6.35 kg)   Body Composition  Body Fat %: 46 % Fat Mass (lbs): 126.2 lbs Muscle Mass (lbs): 140.6 lbs Total Body Water (lbs): 96.8 lbs Visceral Fat Rating : 11   Other Clinical Data Fasting: No Labs: No Today's Visit #: 11 Starting Date: 04/06/23     HPI  Chief Complaint: OBESITY  Jonita is here to discuss her progress with her obesity treatment plan. She is on the the Category 4 Plan and states she is following her eating plan approximately 30 % of the time. She states she is exercising 30 minutes 3 days per week.   Interval History:  Since last office visit she has lost 1 pound.  She hurt her back and has been on prednisone and did PT for 2 visits. She is overall feeling better. She is seeing a Systems analyst two days per week.   She is struggling with polyphagia and cravings.  She is drinking water daily.    Pharmacotherapy for weight loss: She is currently taking Wegovy  1mg  doses for medical weight loss.  Denies side effects.     Wegovy  has been approved from 02/01/24-07/31/24    Previous pharmacotherapy for medical weight loss:  none   Bariatric surgery:  Patient has not had bariatric surgery  Hair loss She started a MVI, collagen, zinc and her hair loss has improved since last visit.   PHYSICAL EXAM:  Blood pressure 104/70, pulse 98, temperature 98 F (36.7 C), height 6' (1.829 m), weight 274 lb (124.3 kg), last menstrual period 03/05/2024, SpO2 100%. Body mass index is 37.16  kg/m.  General: She is overweight, cooperative, alert, well developed, and in no acute distress. PSYCH: Has normal mood, affect and thought process.   Extremities: No edema.  Neurologic: No gross sensory or motor deficits. No tremors or fasciculations noted.    DIAGNOSTIC DATA REVIEWED:  BMET    Component Value Date/Time   NA 139 03/10/2023 1438   K 4.2 03/10/2023 1438   CL 101 03/10/2023 1438   CO2 22 03/10/2023 1438   GLUCOSE 88 03/10/2023 1438   GLUCOSE 63 (L) 09/15/2020 2229   BUN 15 03/10/2023 1438   CREATININE 0.80 03/10/2023 1438   CALCIUM 9.5 03/10/2023 1438   GFRNONAA >60 09/15/2020 2229   GFRAA >60 09/15/2020 2229   Lab Results  Component Value Date   HGBA1C 4.8 03/10/2023   HGBA1C 4.4 (L) 07/09/2020   Lab Results  Component Value Date   INSULIN  6.0 04/06/2023   Lab Results  Component Value Date   TSH 2.100 04/05/2024   CBC    Component Value Date/Time   WBC 5.3 03/10/2023 1438   WBC 7.2 09/15/2020 2229   RBC 4.49 03/10/2023 1438   RBC 4.78 09/15/2020 2229   HGB 15.0 03/10/2023 1438   HCT 43.9 03/10/2023 1438   PLT 254 03/10/2023 1438   MCV 98 (H) 03/10/2023 1438   MCH 33.4 (H) 03/10/2023 1438   MCH 33.7 09/15/2020 2229   MCHC 34.2 03/10/2023  1438   MCHC 35.8 09/15/2020 2229   RDW 13.8 03/10/2023 1438   Iron Studies    Component Value Date/Time   IRON 79 05/01/2018 1740   Lipid Panel     Component Value Date/Time   CHOL 166 09/19/2023 1017   TRIG 97 09/19/2023 1017   HDL 59 09/19/2023 1017   CHOLHDL 2.8 09/19/2023 1017   CHOLHDL 2.2 07/09/2020 1745   VLDL 29 07/09/2020 1745   LDLCALC 89 09/19/2023 1017   Hepatic Function Panel     Component Value Date/Time   PROT 6.9 03/10/2023 1438   ALBUMIN 4.5 03/10/2023 1438   AST 26 03/10/2023 1438   ALT 29 03/10/2023 1438   ALKPHOS 68 03/10/2023 1438   BILITOT 0.5 03/10/2023 1438   BILIDIR 0.1 07/09/2020 1745   IBILI 0.6 07/09/2020 1745      Component Value Date/Time   TSH 2.100  04/05/2024 1349   Nutritional Lab Results  Component Value Date   VD25OH 41.3 03/10/2023   VD25OH 37.1 05/01/2018     ASSESSMENT AND PLAN  TREATMENT PLAN FOR OBESITY:  Recommended Dietary Goals  Citalli is currently in the action stage of change. As such, her goal is to continue weight management plan. She has agreed to the Category 4 Plan.  Behavioral Intervention  We discussed the following Behavioral Modification Strategies today: increasing lean protein intake to established goals, decreasing simple carbohydrates , increasing vegetables, increasing fiber rich foods, increasing water intake , and continue to work on maintaining a reduced calorie state, getting the recommended amount of protein, incorporating whole foods, making healthy choices, staying well hydrated and practicing mindfulness when eating..  Additional resources provided today: NA  Recommended Physical Activity Goals  Rakiyah has been advised to work up to 150 minutes of moderate intensity aerobic activity a week and strengthening exercises 2-3 times per week for cardiovascular health, weight loss maintenance and preservation of muscle mass.   She has agreed to Think about enjoyable ways to increase daily physical activity and overcoming barriers to exercise, Increase physical activity in their day and reduce sedentary time (increase NEAT)., and continue to gradually increase the amount and intensity of exercise routine   Pharmacotherapy We discussed various medication options to help [facility] with her weight loss efforts and we both agreed to increase Wegovy  1.7mg . Side effects discussed.  ASSOCIATED CONDITIONS ADDRESSED TODAY  Action/Plan  Hair loss Improved, will continue to monitor.  Discussed that rapid weight loss can increase the risk of hair loss-taking Wegovy .   Will increase Wegovy  dose due to polyphagia and cravings.  If hair loss reoccurs to let us  know.  Discussed the importance of a well  balanced diet.  Needs to meet calories and protein goals.  She is also coloring her own hair.  Recommended meeting with a beautician for future coloring.    Class 2 obesity due to excess calories without serious comorbidity with body mass index (BMI) of 37.0 to 37.9 in adult -     Wegovy ; Inject 1.7 mg into the skin once a week.  Dispense: 3 mL; Refill: 0      Will obtain labs at next visit.   Return in about 4 weeks (around 05/30/2024).Aaron Aas She was informed of the importance of frequent follow up visits to maximize her success with intensive lifestyle modifications for her multiple health conditions.   ATTESTASTION STATEMENTS:  Reviewed by clinician on day of visit: allergies, medications, problem list, medical history, surgical history, family history, social history, and previous  encounter notes.     Crist Dominion. Manjot Beumer FNP-C

## 2024-05-02 NOTE — Telephone Encounter (Signed)
 [facility] #[id number] - [place], [place] - [address]   traZODone  (DESYREL ) 50 MG tablet   gabapentin  (NEURONTIN ) 100 MG capsule   NEXT APPT  06/06/24 LAST APPT    03/07/24

## 2024-05-03 MED ORDER — BUPROPION HCL ER (SR) 100 MG PO TB12
100.0000 mg | ORAL_TABLET | Freq: Two times a day (BID) | ORAL | 1 refills | Status: DC
Start: 2024-05-03 — End: 2024-07-04

## 2024-05-03 MED ORDER — GABAPENTIN 100 MG PO CAPS: 100.0000 mg | ORAL_CAPSULE | Freq: Three times a day (TID) | ORAL | 0 refills | Status: DC

## 2024-05-03 MED ORDER — TRAZODONE HCL 50 MG PO TABS: 50.0000 mg | ORAL_TABLET | Freq: Every day | ORAL | 0 refills | Status: DC

## 2024-05-03 NOTE — Telephone Encounter (Signed)
 [facility] #[id number] - , [place] - [address]   BUPROPION  (WELLBUTRIN )  NEXT APPT  06/06/24  LAST APPT    03/07/24

## 2024-05-03 NOTE — Telephone Encounter (Signed)
 buPROPion  ER (WELLBUTRIN  SR) 100 MG 12 hr tablet  SENT ON 04/27/24  TECHNICAL DIFFICULTIES WITH ALL E-SCRIPT  Rx ON THAT DAY  PLEASE RESEND

## 2024-05-03 NOTE — Addendum Note (Signed)
 Addended by: Grover Robinson on: 05/03/2024 11:05 AM   Modules accepted: Orders

## 2024-05-03 NOTE — Addendum Note (Signed)
 Addended by: Guido Comp on: 05/03/2024 09:23 AM   Modules accepted: Orders

## 2024-05-03 NOTE — Telephone Encounter (Signed)
 PATIENT REQUEST STATES SHE NEEDS ALMOST OUT  BUPROPION  (WELLBUTRIN )

## 2024-05-08 ENCOUNTER — Telehealth (HOSPITAL_COMMUNITY): Payer: Self-pay | Admitting: *Deleted

## 2024-05-08 MED ORDER — AMPHETAMINE-DEXTROAMPHET ER 20 MG PO CP24: 20.0000 mg | ORAL_CAPSULE | Freq: Every day | ORAL | 0 refills | Status: DC

## 2024-05-08 NOTE — Telephone Encounter (Signed)
 PATIENT REQUEST [facility] [phone] - [place], [place]   amphetamine -dextroamphetamine  (ADDERALL XR) 20 MG 24 hr capsule   NEXT APPT  06/06/24  LAST APPT    03/07/24

## 2024-05-08 NOTE — Addendum Note (Signed)
 Addended by: Kaius Daino on: 05/08/2024 03:41 PM   Modules accepted: Orders

## 2024-05-09 ENCOUNTER — Ambulatory Visit (HOSPITAL_COMMUNITY): Payer: MEDICAID | Admitting: Licensed Clinical Social Worker

## 2024-05-09 ENCOUNTER — Telehealth (HOSPITAL_COMMUNITY): Payer: Self-pay | Admitting: *Deleted

## 2024-05-09 NOTE — Telephone Encounter (Signed)
 Told patient Rx sent on 5/13 for refill

## 2024-05-22 ENCOUNTER — Telehealth (HOSPITAL_COMMUNITY): Payer: Self-pay | Admitting: *Deleted

## 2024-05-22 DIAGNOSIS — F3162 Bipolar disorder, current episode mixed, moderate: Secondary | ICD-10-CM

## 2024-05-22 MED ORDER — LURASIDONE HCL 120 MG PO TABS: 120.0000 mg | ORAL_TABLET | Freq: Every evening | ORAL | 1 refills | Status: DC

## 2024-05-22 NOTE — Addendum Note (Signed)
 Addended by: Wray Heady on: 05/22/2024 08:50 AM   Modules accepted: Orders

## 2024-05-22 NOTE — Telephone Encounter (Signed)
 Patient Request  [facility] #[id number] - [place], [place]   Lurasidone  HCl   NEXT APPT  06/06/24  LAST APPT    03/07/24

## 2024-05-23 ENCOUNTER — Ambulatory Visit (INDEPENDENT_AMBULATORY_CARE_PROVIDER_SITE_OTHER): Payer: MEDICAID | Admitting: Bariatrics

## 2024-05-23 ENCOUNTER — Other Ambulatory Visit: Payer: Self-pay

## 2024-05-23 ENCOUNTER — Ambulatory Visit
Admission: EM | Admit: 2024-05-23 | Discharge: 2024-05-23 | Disposition: A | Discharge status: Home / Self Care | Payer: MEDICAID | Attending: Family Medicine | Admitting: Family Medicine

## 2024-05-23 ENCOUNTER — Encounter: Payer: Self-pay | Admitting: Bariatrics

## 2024-05-23 ENCOUNTER — Ambulatory Visit (HOSPITAL_COMMUNITY): Payer: MEDICAID | Admitting: Licensed Clinical Social Worker

## 2024-05-23 VITALS — BP 105/71 | HR 80 | Temp 97.7°F | Ht 72.0 in | Wt 270.0 lb

## 2024-05-23 DIAGNOSIS — Z6836 Body mass index (BMI) 36.0-36.9, adult: Secondary | ICD-10-CM

## 2024-05-23 DIAGNOSIS — M461 Sacroiliitis, not elsewhere classified: Secondary | ICD-10-CM | POA: Diagnosis not present

## 2024-05-23 DIAGNOSIS — R632 Polyphagia: Secondary | ICD-10-CM

## 2024-05-23 DIAGNOSIS — Z0000 Encounter for general adult medical examination without abnormal findings: Secondary | ICD-10-CM

## 2024-05-23 DIAGNOSIS — E669 Obesity, unspecified: Secondary | ICD-10-CM

## 2024-05-23 DIAGNOSIS — E66812 Other obesity due to excess calories: Secondary | ICD-10-CM

## 2024-05-23 DIAGNOSIS — E781 Pure hyperglyceridemia: Secondary | ICD-10-CM | POA: Diagnosis not present

## 2024-05-23 MED ORDER — METHYLPREDNISOLONE 4 MG PO TBPK
ORAL_TABLET | ORAL | 0 refills | Status: DC
Start: 2024-05-23 — End: 2024-06-20

## 2024-05-23 MED ORDER — BACLOFEN 10 MG PO TABS: 10.0000 mg | ORAL_TABLET | Freq: Three times a day (TID) | ORAL | 1 refills | Status: AC

## 2024-05-23 MED ORDER — MELOXICAM 15 MG PO TABS: 15.0000 mg | ORAL_TABLET | Freq: Every day | ORAL | 0 refills | Status: AC

## 2024-05-23 MED ORDER — WEGOVY 1.7 MG/0.75ML ~~LOC~~ SOAJ: 1.7000 mg | SUBCUTANEOUS | 0 refills | Status: DC

## 2024-05-23 NOTE — ED Triage Notes (Signed)
 Back pain flare x 3 days. Had another flare 1 month ago and was seen by physician, states it is bil sacroiliac joints that hurt. Has had tylenol  and ibuprofen. Also used a salve from [place] that is similar to icy-hot.

## 2024-05-23 NOTE — ED Provider Notes (Signed)
 [facility]    CSN: [id number] Arrival date & time: 05/23/24  1441      History   Chief Complaint Chief Complaint  Patient presents with   Back Pain    HPI Wanda Torres is a 33 y.o. female.   HPI  Patient states she does not usually have any back problems.  She developed back pain about a month ago.  She saw her PCP who did x-rays.  There was some early degenerative changes but nothing remarkable.  She was treated with a steroid pack and baclofen .  She attended physical therapy.  The back pain went away.  Physical therapy released her.  She states she felt perfectly well.  3 days ago the back pain came back.  She was trying to manage it at home with Tylenol , ibuprofen, and stretching.  This morning it was so severe she could not get out of bed.  She has an appointment with her PCP tomorrow but did not feel like she could wait that long.  She is frustrated that the back pain is come back in spite of her efforts  Past Medical History:  Diagnosis Date   Acne    ADD (attention deficit disorder)    Alcohol abuse    Allergy    Anemia    Anxiety    ASCUS with positive high risk human papillomavirus of vagina 05/2022   Bipolar 1 disorder (HCC)    Depression    Depression    Drug use    Hypothyroidism    Joint pain    PCOS (polycystic ovarian syndrome)    Psoriasis    Supervision of other high risk pregnancy, antepartum 01/19/2019   Formatting of this note might be different from the original. Partner prefers She/Her pronouns   Swelling of lower extremity    Vitamin B 12 deficiency    Vitamin D  deficiency     Patient Active Problem List   Diagnosis Date Noted   Tachycardia 03/07/2024   Long term current use of antipsychotic medication 07/29/2023   BMI 39.0-39.9,adult 04/06/2023   B12 deficiency 04/06/2023   Hypertriglyceridemia 04/06/2023   Other fatigue 04/06/2023   Generalized anxiety disorder 05/28/2021   Bipolar disorder, current episode mixed,  moderate (HCC) 03/26/2021   Mixed bipolar affective disorder (HCC) 03/26/2021   Insomnia 10/25/2020   S/P right knee surgery 01/02/2020   Rupture of anterior cruciate ligament of right knee 12/06/2019   Attention deficit hyperactivity disorder (ADHD), 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   NO SQUAMOUS MUCOSA IDENTIFIED.   NO EVIDENCE OF DYSPLASIA OR MALIGNANCY.    OB History     Gravida  4   Para      Term      Preterm      AB      Living         SAB      IAB      Ectopic      Multiple      Live Births               Home Medications    Prior to Admission medications   Medication Sig Start Date End Date Taking? Authorizing Provider  baclofen  (LIORESAL ) 10 MG tablet Take 1 tablet (10 mg total) by mouth 3 (three) times daily. 05/23/24  Yes  Stephany Ehrich, MD  meloxicam (MOBIC) 15 MG tablet Take 1 tablet (15 mg total) by mouth daily. Take with food 05/23/24  Yes Stephany Ehrich, MD  methylPREDNISolone  (MEDROL  DOSEPAK) 4 MG TBPK tablet tad 05/23/24  Yes Stephany Ehrich, MD  amphetamine -dextroamphetamine  (ADDERALL XR) 20 MG 24 hr capsule Take 1 capsule (20 mg total) by mouth daily. 05/08/24 05/08/25  Wray Heady, MD  buPROPion  ER (WELLBUTRIN  SR) 100 MG 12 hr tablet Take 1 tablet (100 mg total) by mouth 2 (two) times daily. 05/03/24   Wray Heady, MD  Cetirizine HCl (ZYRTEC ALLERGY PO) Take by mouth.    [provider]  Cholecalciferol (VITAMIN D -3 PO) Take by mouth.    [provider]  Cyanocobalamin (VITAMIN B-12 PO) Take by mouth.    [provider]  Esomeprazole Magnesium  (NEXIUM PO) Take by mouth.    [provider]  gabapentin  (NEURONTIN ) 100 MG capsule Take 1 capsule (100 mg total) by mouth 3 (three) times daily. 05/03/24   Wray Heady, MD   levothyroxine  (SYNTHROID ) 25 MCG tablet Take 1 tablet (25 mcg total) by mouth daily. 02/02/24   Manette Section, MD  Lurasidone  HCl (LATUDA ) 120 MG TABS Take 1 tablet (120 mg total) by mouth every evening. Take with food 05/22/24 07/21/24  Wray Heady, MD  propranolol  (INDERAL ) 10 MG tablet Take 1 tablet (10 mg total) by mouth daily. 03/07/24   Wray Heady, MD  Semaglutide -Weight Management (WEGOVY ) 1.7 MG/0.75ML SOAJ Inject 1.7 mg into the skin once a week. 05/23/24   Kirk Peper A, DO  traZODone  (DESYREL ) 50 MG tablet Take 1 tablet (50 mg total) by mouth at bedtime. 05/03/24   Wray Heady, MD    Family History Family History  Problem Relation Age of Onset   Anxiety disorder Mother    Depression Mother    Sexual abuse Mother    Hypothyroidism Mother    High blood pressure Mother    High Cholesterol Mother    Drug abuse Father    ADD / ADHD Sister    Anxiety disorder Sister    Bipolar disorder Sister    Depression Sister    Drug abuse Sister    Sexual abuse Sister    Anxiety disorder Brother    Depression Brother    Sexual abuse Brother    Thyroid disease Maternal Grandmother        THyroid cancer   Depression Maternal Grandmother    Sexual abuse Maternal Grandmother    Alcohol abuse Maternal Grandfather    Dementia Maternal Grandfather    Depression Maternal Grandfather    Sexual abuse Maternal Grandfather    Alcohol abuse Maternal Aunt    Bipolar disorder Maternal Aunt    Depression Maternal Aunt    Sexual abuse Maternal Aunt    Bipolar disorder Maternal Aunt    Depression Maternal Aunt    Sexual abuse Maternal Aunt    ADD / ADHD Maternal Uncle    Alcohol abuse Maternal Uncle    Drug abuse Maternal Uncle    Sexual abuse Maternal Uncle     Social History Social History   Tobacco Use   Smoking status: Never   Smokeless tobacco: Never   Tobacco comments:    Vaps daily   Vaping Use   Vaping status: Every Day   Substances: Nicotine   Substance Use Topics    Alcohol use: Not Currently    Alcohol/week: 1.0 standard drink of alcohol    Types: 1 Standard  drinks or equivalent per week    Comment: reports one a week   Drug use: Never     Allergies   Patient has no known allergies.   Review of Systems Review of Systems See HPI  Physical Exam Triage Vital Signs ED Triage Vitals  Encounter Vitals Group     BP 05/23/24 1448 109/75     Systolic BP Percentile --      Diastolic BP Percentile --      Pulse Rate 05/23/24 1448 92     Resp 05/23/24 1448 16     Temp 05/23/24 1448 98.4 F (36.9 C)     Temp src --      SpO2 05/23/24 1448 96 %     Weight --      Height --      Head Circumference --      Peak Flow --      Pain Score 05/23/24 1452 7     Pain Loc --      Pain Education --      Exclude from Growth Chart --    No data found.  Updated Vital Signs BP 109/75   Pulse 92   Temp 98.4 F (36.9 C)   Resp 16   LMP 05/09/2024 (Approximate)   SpO2 96%       Physical Exam Constitutional:      General: She is not in acute distress.    Appearance: She is well-developed.     Comments: Overweight.  Uncomfortable.  Guarded movements  HENT:     Head: Normocephalic and atraumatic.  Eyes:     Conjunctiva/sclera: Conjunctivae normal.     Pupils: Pupils are equal, round, and reactive to light.  Cardiovascular:     Rate and Rhythm: Normal rate.  Pulmonary:     Effort: Pulmonary effort is normal. No respiratory distress.  Abdominal:     General: There is no distension.     Palpations: Abdomen is soft.  Musculoskeletal:        General: Tenderness present. No swelling or deformity. Normal range of motion.     Cervical back: Normal range of motion.     Comments: Tenderness bilaterally over the SI joints.  No tenderness centrally over the LS spine.  No palpable muscle spasm.  Slow but full range of motion.  Negative straight leg raise.  Skin:    General: Skin is warm and dry.  Neurological:     Mental Status: She is alert.       UC Treatments / Results  Labs (all labs ordered are listed, but only abnormal results are displayed) Labs Reviewed - No data to display  EKG   Radiology No results found.  Procedures Procedures (including critical care time)  Medications Ordered in UC Medications - No data to display  Initial Impression / Assessment and Plan / UC Course  I have reviewed the triage vital signs and the nursing notes.  Pertinent labs & imaging results that were available during my care of the patient were reviewed by me and considered in my medical decision making (see chart for details).     I recommend repeating the Medrol  Dosepak and baclofen .  When she finishes her Dosepak she should go on meloxicam daily for a couple of weeks, until resolved.  I do recommend she see Dr. Sandy Crumb in follow-up to help identify why she keeps getting SI problems. Final Clinical Impressions(s) / UC Diagnoses   Final diagnoses:  Sacroiliac inflammation (HCC)  Discharge Instructions      Take the Dosepak as directed.  Take all of Depo today Take baclofen  muscle relaxer 3 times a day as needed.  I given you a refill in case needed When you have finished the Dosepak start meloxicam once a day.  Take this medicine with food  Make an appointment to see Dr. Sandy Crumb in follow-up.  ED Prescriptions     Medication Sig Dispense Auth. Provider   methylPREDNISolone  (MEDROL  DOSEPAK) 4 MG TBPK tablet tad 21 tablet Stephany Ehrich, MD   baclofen  (LIORESAL ) 10 MG tablet Take 1 tablet (10 mg total) by mouth 3 (three) times daily. 30 each Stephany Ehrich, MD   meloxicam (MOBIC) 15 MG tablet Take 1 tablet (15 mg total) by mouth daily. Take with food 30 tablet Stephany Ehrich, MD      PDMP not reviewed this encounter.   Stephany Ehrich, MD 05/23/24 [phone]

## 2024-05-23 NOTE — Progress Notes (Signed)
 WEIGHT SUMMARY AND BIOMETRICS  Weight Lost Since Last Visit: 4lb  Weight Gained Since Last Visit: 0   Vitals Temp: 97.7 F (36.5 C) BP: 105/71 Pulse Rate: 80 SpO2: 98 %   Anthropometric Measurements Height: 6' (1.829 m) Weight: 270 lb (122.5 kg) BMI (Calculated): 36.61 Weight at Last Visit: 274lb Weight Lost Since Last Visit: 4lb Weight Gained Since Last Visit: 0 Starting Weight: 288lb Total Weight Loss (lbs): 18 lb (8.165 kg)   Body Composition  Body Fat %: 46.5 % Fat Mass (lbs): 125.8 lbs Muscle Mass (lbs): 137.4 lbs Total Body Water (lbs): 93.8 lbs Visceral Fat Rating : 11   Other Clinical Data Fasting: no Labs: no Today's Visit #: 12 Starting Date: 04/06/23    OBESITY Kataleena is here to discuss her progress with her obesity treatment plan along with follow-up of her obesity related diagnoses.    Nutrition Plan: the Category 4 plan - 50% adherence.  Current exercise: Goes to the gym for exercise.  Interim History:  She is down 4 lbs since her last visit.  Eating all of the food on the plan., Protein intake is as prescribed, Is not skipping meals, and Water intake is adequate.   Pharmacotherapy: Judithann is on Wegovy  1.7 SQ weekly Adverse side effects: hair loss but resolved. Taking Nexium for heartburn. Hunger is moderately controlled.  Cravings are moderately controlled.  Assessment/Plan:   Roxanna Mcever endorses excessive hunger.  Medication(s): Wegovy   Effects of medication:  moderately controlled. Cravings are moderately controlled.   Plan: Medication(s): Wegovy  1.7 SQ weekly Will increase water, protein and fiber to help assuage hunger.  Will minimize foods that have a high glucose index/load to minimize reactive hypoglycemia.   Hypertriglyceridemia:  She has history of elevated triglycerides.  She is not on any  medications for triglycerides but has been working on diet and exercise.  Plan: Will check a cholesterol panel today. Will continue to work on diet and exercise.   Healthcare maintenance:  Obesity  Plan: Will do labs as below and make any adjustments in her plan as needed.   Labs done today (CMP, Lipids, vitamin D , B 12).    Generalized Obesity: Current BMI BMI (Calculated): 36.61   Pharmacotherapy Plan Continue  Wegovy  1.7 SQ weekly  Loletta is currently in the action stage of change. As such, her goal is to continue with weight loss efforts.  She has agreed to the Category 4 plan.  Exercise goals: All adults should avoid inactivity. Some physical activity is better than none, and adults who participate in any amount of physical activity gain some health benefits.  Behavioral modification strategies: increasing lean protein intake, no meal skipping, meal planning , increase water intake, better snacking choices, planning for success, increasing vegetables, increasing fiber rich foods, decrease snacking , and avoiding temptations.  Makaela has agreed to follow-up with our clinic in  4 weeks.    Objective:   VITALS: Per patient if applicable, see vitals. GENERAL: Alert and in no acute distress. CARDIOPULMONARY: No increased WOB. Speaking in clear sentences.  PSYCH: Pleasant and cooperative. Speech normal rate and rhythm. Affect is appropriate. Insight and judgement are appropriate. Attention is focused, linear, and appropriate.  NEURO: Oriented as arrived to appointment on time with no prompting.   Attestation Statements:   This was prepared with the assistance of Engineer, civil (consulting).  Occasional wrong-word or sound-a-like substitutions may have occurred due to the inherent limitations of voice recognition   Kirk Peper, DO

## 2024-05-23 NOTE — Discharge Instructions (Signed)
 Take the Dosepak as directed.  Take all of Depo today Take baclofen  muscle relaxer 3 times a day as needed.  I given you a refill in case needed When you have finished the Dosepak start meloxicam once a day.  Take this medicine with food  Make an appointment to see Dr. Sandy Crumb in follow-up.

## 2024-05-24 ENCOUNTER — Ambulatory Visit: Payer: Self-pay

## 2024-05-24 ENCOUNTER — Encounter: Payer: Self-pay | Admitting: Bariatrics

## 2024-05-24 ENCOUNTER — Telehealth (HOSPITAL_COMMUNITY): Payer: Self-pay | Admitting: *Deleted

## 2024-05-24 DIAGNOSIS — R7401 Elevation of levels of liver transaminase levels: Secondary | ICD-10-CM | POA: Insufficient documentation

## 2024-05-24 LAB — LIPID PANEL WITH LDL/HDL RATIO
Cholesterol, Total: 152 mg/dL (ref 100–199)
HDL: 42 mg/dL (ref >39)
LDL Chol Calc (NIH): 86 mg/dL (ref 0–99)
LDL/HDL Ratio: 2 ratio (ref 0.0–3.2)
Triglycerides: 137 mg/dL (ref 0–149)
VLDL Cholesterol Cal: 24 mg/dL (ref 5–40)

## 2024-05-24 LAB — COMPREHENSIVE METABOLIC PANEL WITH GFR
ALT: 38 IU/L — ABNORMAL HIGH (ref 0–32)
AST: 24 IU/L (ref 0–40)
Albumin: 4.5 g/dL (ref 3.9–4.9)
Alkaline Phosphatase: 66 IU/L (ref 44–121)
BUN/Creatinine Ratio: 7 — ABNORMAL LOW (ref 9–23)
BUN: 6 mg/dL (ref 6–20)
Bilirubin Total: 0.6 mg/dL (ref 0.0–1.2)
CO2: 22 mmol/L (ref 20–29)
Calcium: 9.8 mg/dL (ref 8.7–10.2)
Chloride: 102 mmol/L (ref 96–106)
Creatinine, Ser: 0.89 mg/dL (ref 0.57–1.00)
Globulin, Total: 2.5 g/dL (ref 1.5–4.5)
Glucose: 84 mg/dL (ref 70–99)
Potassium: 4.4 mmol/L (ref 3.5–5.2)
Sodium: 140 mmol/L (ref 134–144)
Total Protein: 7 g/dL (ref 6.0–8.5)
eGFR: 88 mL/min/{1.73_m2} (ref >59)

## 2024-05-24 LAB — VITAMIN B12: Vitamin B-12: >2000 pg/mL — ABNORMAL HIGH (ref 232–1245)

## 2024-05-24 LAB — VITAMIN D 25 HYDROXY (VIT D DEFICIENCY, FRACTURES): Vit D, 25-Hydroxy: 79.2 ng/mL (ref 30.0–100.0)

## 2024-05-24 MED ORDER — AMPHETAMINE-DEXTROAMPHET ER 20 MG PO CP24: 20.0000 mg | ORAL_CAPSULE | Freq: Every day | ORAL | 0 refills | Status: DC

## 2024-05-24 NOTE — Telephone Encounter (Signed)
-----   Message from Kirk Peper sent at 05/24/2024  1:10 PM EDT ----- Call pt. Is she taking vitamin D ? If so, she can stop for now, but may need to resume in the future

## 2024-05-24 NOTE — Addendum Note (Signed)
 Addended by: Kshawn Canal on: 05/24/2024 04:46 PM   Modules accepted: Orders

## 2024-05-24 NOTE — Telephone Encounter (Signed)
 Patient called [facility] that on  05/08/24 refill that was picked up had only 19 capsules due  to that was all the RX had in stock.  Now she has only  4 left  &  called the the Rx for the reaming capsules & was told a new script is required  for the reaming capsules.  amphetamine -dextroamphetamine  (ADDERALL XR) 20 MG 24 hr capsule   [id number] - [place], [place] -   NEXT APPT  06/06/24  LAST APPT    03/07/24

## 2024-05-24 NOTE — Telephone Encounter (Signed)
 Left message for patient to return call. Will also send patient a MyChart message

## 2024-05-28 ENCOUNTER — Telehealth (HOSPITAL_COMMUNITY): Payer: Self-pay | Admitting: *Deleted

## 2024-05-28 MED ORDER — AMPHETAMINE-DEXTROAMPHET ER 20 MG PO CP24: 20.0000 mg | ORAL_CAPSULE | Freq: Every day | ORAL | 0 refills | Status: DC

## 2024-05-28 NOTE — Telephone Encounter (Signed)
 Patient has called stated her  Rx is still out of stock   [id number] - [place], [place] - [address]    amphetamine -dextroamphetamine  (ADDERALL XR) 20 MG 24 hr capsule-- provider sent -- 0529/25  OUT  OF STOCK  PATIENT REQUEST REFILL BE SENT TO  [facility] [address] [place]

## 2024-05-28 NOTE — Telephone Encounter (Signed)
 Patient checking on med that has been sent ---- amphetamine -dextroamphetamine  (ADDERALL XR) 20 MG 24 hr capsule-- provider sent -- 0529/25  [facility][id number] - [place],  - [address]

## 2024-05-28 NOTE — Addendum Note (Signed)
 Addended by: Kylar Leonhardt on: 05/28/2024 04:54 PM   Modules accepted: Orders

## 2024-06-01 ENCOUNTER — Encounter: Payer: Self-pay | Admitting: Sports Medicine

## 2024-06-01 ENCOUNTER — Ambulatory Visit (INDEPENDENT_AMBULATORY_CARE_PROVIDER_SITE_OTHER): Payer: MEDICAID | Admitting: Sports Medicine

## 2024-06-01 DIAGNOSIS — M5136 Other intervertebral disc degeneration, lumbar region with discogenic back pain only: Secondary | ICD-10-CM | POA: Diagnosis not present

## 2024-06-01 DIAGNOSIS — M51369 Other intervertebral disc degeneration, lumbar region without mention of lumbar back pain or lower extremity pain: Secondary | ICD-10-CM | POA: Insufficient documentation

## 2024-06-01 NOTE — Assessment & Plan Note (Signed)
 This is a very pleasant 33 year old female, she has had a long history of axial back pain, nothing radicular, pain is discogenic worse with sitting, flexion, Valsalva, no red flag symptoms. Has had some therapy in the past, steroids, overall pain is well-controlled with the above and with meloxicam , she is interested in more of an answer as to why this is happening. 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 Based on her symptomatology I think her pain is more discogenic, I explained the evolutionary anthropology of lumbar disc disease, I explained the treatment protocol. She understands this will take some long-term physical therapy, occasional anti-inflammatories, and if PT fails we would consider MRI and epidural. We will start with therapy, and she can return to see me in 6 weeks as needed.

## 2024-06-01 NOTE — Progress Notes (Signed)
    Procedures performed today:    None.  Independent interpretation of notes and tests performed by another provider:   None.  Brief History, Exam, Impression, and Recommendations:    Lumbar degenerative disc disease This is a very pleasant 33 year old female, she has had a long history of axial back pain, nothing radicular, pain is discogenic worse with sitting, flexion, Valsalva, no red flag symptoms. Has had some therapy in the past, steroids, overall pain is well-controlled with the above and with meloxicam , she is interested in more of an answer as to why this is happening. 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 Based on her symptomatology I think her pain is more discogenic, I explained the evolutionary anthropology of lumbar disc disease, I explained the treatment protocol. She understands this will take some long-term physical therapy, occasional anti-inflammatories, and if PT fails we would consider MRI and epidural. We will start with therapy, and she can return to see me in 6 weeks as needed.    ____________________________________________ Joselyn Nicely. Sandy Crumb, M.D., ABFM., CAQSM., AME. Primary Care and Sports Medicine [facility]  Adjunct Professor of [facility] Medicine  [employer]  Restaurant manager, fast food

## 2024-06-06 ENCOUNTER — Telehealth (INDEPENDENT_AMBULATORY_CARE_PROVIDER_SITE_OTHER): Payer: MEDICAID | Admitting: Psychiatry

## 2024-06-06 ENCOUNTER — Encounter (HOSPITAL_COMMUNITY): Payer: Self-pay | Admitting: Psychiatry

## 2024-06-06 DIAGNOSIS — R000 Tachycardia, unspecified: Secondary | ICD-10-CM

## 2024-06-06 DIAGNOSIS — G4700 Insomnia, unspecified: Secondary | ICD-10-CM

## 2024-06-06 DIAGNOSIS — F411 Generalized anxiety disorder: Secondary | ICD-10-CM

## 2024-06-06 DIAGNOSIS — F3162 Bipolar disorder, current episode mixed, moderate: Secondary | ICD-10-CM

## 2024-06-06 MED ORDER — LURASIDONE HCL 120 MG PO TABS: 120.0000 mg | ORAL_TABLET | Freq: Every evening | ORAL | 0 refills | Status: DC

## 2024-06-06 MED ORDER — GABAPENTIN 100 MG PO CAPS: 100.0000 mg | ORAL_CAPSULE | Freq: Three times a day (TID) | ORAL | 0 refills | Status: DC

## 2024-06-06 NOTE — Progress Notes (Signed)
 [facility] Follow up visit  Patient Identification: Wanda Torres MRN:  [id number] Date of Evaluation:  06/06/2024 Referral Source: [facility] . Dr. Leia Pun Chief Complaint:   No chief complaint on file. Follow up depression  Visit Diagnosis:    ICD-10-CM   1. Bipolar disorder, current episode mixed, moderate (HCC)  F31.62 Lurasidone  HCl (LATUDA ) 120 MG TABS    2. Insomnia, unspecified type  G47.00     3. Tachycardia  R00.0     4. Generalized anxiety disorder  F41.1 gabapentin  (NEURONTIN ) 100 MG capsule    Virtual Visit via Video Note  I connected with Wanda Torres on 06/06/24 at  1:00 PM EDT by a video enabled telemedicine application and verified that I am speaking with the correct person using two identifiers.  Location: Patient: home Provider: home office   I discussed the limitations of evaluation and management by telemedicine and the availability of in person appointments. The patient expressed understanding and agreed to proceed.     I discussed the assessment and treatment plan with the patient. The patient was provided an opportunity to ask questions and all were answered. The patient agreed with the plan and demonstrated an understanding of the instructions.   The patient was advised to call back or seek an in-person evaluation if the symptoms worsen or if the condition fails to improve as anticipated.  I provided 18 minutes of non-face-to-face time during this encounter.      History of Present Illness: Patient is a 33 years old white female currently living with her fianc initially referred by [facility] as she is a resident of [facility] in [place] so transferred from [facility] patient.  Diagnosed with bipolar, ADD, anxiety.  Patient currently living with her fianc she has 3 kids were not living with her and 2 of the kids are with her ex and 1 kid is with her sister and is going thru custody concerns   On evaluation patient is doing better she is  tapered down her Inderal  does not endorse palpitations.  She feels more alert less fatigue Keeps busy with animals and has supportive Fiance she is planning for her marriage  She didn't lower the gaba as now doing better not fatigue Also on adderall    Aggravating factors; kids, custody concern Modifying factors; her kids, fianc, animals  Duration more than 10 years Severity improved   Past Psychiatric History: Bipolar, anxiety, depression  Previous Psychotropic Medications: Yes  Wellbutrin , cymbalta , zoloft  . Says didn't work  Substance Abuse History in the last 12 months:  Yes.    Consequences of Substance Abuse: Has been drinking till last 2 weeks, discussed abstinence and alcohol effect to depression, judjement  Past Medical History:  Past Medical History:  Diagnosis Date   Acne    ADD (attention deficit disorder)    Alcohol abuse    Allergy    Anemia    Anxiety    ASCUS with positive high risk human papillomavirus of vagina 05/2022   Bipolar 1 disorder (HCC)    Depression    Depression    Drug use    Hypothyroidism    Joint pain    PCOS (polycystic ovarian syndrome)    Psoriasis    Supervision of other high risk pregnancy, antepartum 01/19/2019   Formatting of this note might be different from the original. Partner prefers She/Her pronouns   Swelling of lower extremity    Vitamin B 12 deficiency    Vitamin D  deficiency  Past Surgical History:  Procedure Laterality Date   COLPOSCOPY  07/2022   ENDOCERVICAL MUCOSA WITH CHRONIC CERVICITIS.   NO SQUAMOUS MUCOSA IDENTIFIED.   NO EVIDENCE OF DYSPLASIA OR MALIGNANCY.    Family Psychiatric History: Aunt : bipolar, Mom; depression  Family History:  Family History  Problem Relation Age of Onset   Anxiety disorder Mother    Depression Mother    Sexual abuse Mother    Hypothyroidism Mother    High blood pressure Mother    High Cholesterol Mother    Drug abuse Father    ADD / ADHD Sister    Anxiety  disorder Sister    Bipolar disorder Sister    Depression Sister    Drug abuse Sister    Sexual abuse Sister    Anxiety disorder Brother    Depression Brother    Sexual abuse Brother    Thyroid disease Maternal Grandmother        THyroid cancer   Depression Maternal Grandmother    Sexual abuse Maternal Grandmother    Alcohol abuse Maternal Grandfather    Dementia Maternal Grandfather    Depression Maternal Grandfather    Sexual abuse Maternal Grandfather    Alcohol abuse Maternal Aunt    Bipolar disorder Maternal Aunt    Depression Maternal Aunt    Sexual abuse Maternal Aunt    Bipolar disorder Maternal Aunt    Depression Maternal Aunt    Sexual abuse Maternal Aunt    ADD / ADHD Maternal Uncle    Alcohol abuse Maternal Uncle    Drug abuse Maternal Uncle    Sexual abuse Maternal Uncle     Social History:   Social History   Socioeconomic History   Marital status: Divorced    Spouse name: Not on file   Number of children: 4   Years of education: Not on file   Highest education level: Some college, no degree  Occupational History    Comment: Supervisor reservations; 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[employer]  Tobacco Use   Smoking status: Never   Smokeless tobacco: Never   Tobacco comments:    Vaps daily   Vaping Use   Vaping status: Every Day   Substances: Nicotine   Substance and Sexual Activity   Alcohol use: Not Currently    Alcohol/week: 1.0 standard drink of alcohol    Types: 1 Standard drinks or equivalent per week    Comment: reports one a week   Drug use: Never   Sexual activity: Not on file  Other Topics Concern   Not on file  Social History Narrative   Not on file   Social Drivers of Health   Financial Resource Strain: Low Risk  (04/04/2024)   Overall Financial Resource Strain (CARDIA)    Difficulty of Paying Living Expenses: Not very hard  Food Insecurity: No Food Insecurity (04/04/2024)   Hunger Vital Sign    Worried About Running Out of Food in the Last Year:  Never true    Ran Out of Food in the Last Year: Never true  Transportation Needs: No Transportation Needs (04/04/2024)   PRAPARE - Administrator, Civil Service (Medical): No    Lack of Transportation (Non-Medical): No  Physical Activity: Insufficiently Active (04/04/2024)   Exercise Vital Sign    Days of Exercise per Week: 3 days    Minutes of Exercise per Session: 30 min  Stress: No Stress Concern Present (04/04/2024)   [employer] of Occupational Health - Occupational Stress Questionnaire  Feeling of Stress : Not at all  Social Connections: Moderately Isolated (04/04/2024)   Social Connection and Isolation Panel [NHANES]    Frequency of Communication with Friends and Family: More than three times a week    Frequency of Social Gatherings with Friends and Family: Once a week    Attends Religious Services: Never    Database administrator or Organizations: No    Attends Engineer, structural: Not on file    Marital Status: Living with partner     Allergies:  No Known Allergies  Metabolic Disorder Labs: Lab Results  Component Value Date   HGBA1C 4.8 03/10/2023   MPG 79.58 07/09/2020   No results found for: PROLACTIN Lab Results  Component Value Date   CHOL 152 05/23/2024   TRIG 137 05/23/2024   HDL 42 05/23/2024   CHOLHDL 2.8 09/19/2023   VLDL 29 07/09/2020   LDLCALC 86 05/23/2024   LDLCALC 89 09/19/2023   Lab Results  Component Value Date   TSH 2.100 04/05/2024    Therapeutic Level Labs: No results found for: LITHIUM No results found for: CBMZ No results found for: VALPROATE  Current Medications: Current Outpatient Medications  Medication Sig Dispense Refill   amphetamine -dextroamphetamine  (ADDERALL XR) 20 MG 24 hr capsule Take 1 capsule (20 mg total) by mouth daily. 30 capsule 0   baclofen  (LIORESAL ) 10 MG tablet Take 1 tablet (10 mg total) by mouth 3 (three) times daily. 30 each 1   buPROPion  ER (WELLBUTRIN  SR) 100 MG 12 hr tablet  Take 1 tablet (100 mg total) by mouth 2 (two) times daily. 60 tablet 1   Cetirizine HCl (ZYRTEC ALLERGY PO) Take by mouth.     Cholecalciferol (VITAMIN D -3 PO) Take by mouth.     Cyanocobalamin (VITAMIN B-12 PO) Take by mouth.     Esomeprazole Magnesium  (NEXIUM PO) Take by mouth.     gabapentin  (NEURONTIN ) 100 MG capsule Take 1 capsule (100 mg total) by mouth 3 (three) times daily. 270 capsule 0   levothyroxine  (SYNTHROID ) 25 MCG tablet Take 1 tablet (25 mcg total) by mouth daily. 90 tablet 3   Lurasidone  HCl (LATUDA ) 120 MG TABS Take 1 tablet (120 mg total) by mouth every evening. Take with food 90 tablet 0   meloxicam  (MOBIC ) 15 MG tablet Take 1 tablet (15 mg total) by mouth daily. Take with food 30 tablet 0   methylPREDNISolone  (MEDROL  DOSEPAK) 4 MG TBPK tablet tad 21 tablet 0   Semaglutide -Weight Management (WEGOVY ) 1.7 MG/0.75ML SOAJ Inject 1.7 mg into the skin once a week. 3 mL 0   No current facility-administered medications for this visit.     Psychiatric Specialty Exam: Review of Systems  Cardiovascular:  Negative for chest pain.  Neurological:  Negative for tremors.    Last menstrual period 05/09/2024.There is no height or weight on file to calculate BMI.  General Appearance: Casual  Eye Contact:  Fair  Speech:  Slow  Volume:  Decreased  Mood: better  Affect:  Congruent  Thought Process:  Goal Directed  Orientation:  Full (Time, Place, and Person)  Thought Content:  Rumination  Suicidal Thoughts:  No  Homicidal Thoughts:  No  Memory:  Immediate;   Fair  Judgement:  Fair  Insight:  Shallow  Psychomotor Activity:  Decreased  Concentration:  Concentration: Fair  Recall:  [employer] of Knowledge:Fair  Language: Fair  Akathisia:  No  Handed:    AIMS (if indicated):  no involuntary movements  Assets:  Desire for Improvement  ADL's:  Intact  Cognition: WNL  Sleep:  Fair   Screenings: AIMS    Flowsheet Row Ad[facility] (Discharged) [facility]  AIMS Total Score 0      AUDIT    Flowsheet Row Office Visit from 04/05/2024 in [facility] from 03[facility]24/2023 in [facility] Admission (Discharged) [facility] Admission (Discharged) from OP [facility]  Alcohol Use Disorder Identification Test Final Score (AUDIT) 5  5 2 3       GAD-7    Flowsheet Row Office Visit from 03/10/2023 in [facility] Video Visit from 09/14/2022 in [facility] Video Visit from 08/04/2022 in [facility] Video Visit from 06/16/2022 in [facility] Video Visit from 04/14/2022 in [facility]  Total GAD-7 Score 6 5 4 1 2       PHQ2-9    Flowsheet Row Office Visit from 04/05/2024 in [facility] Visit from 11/04/2023 in [facility] Counselor from 10/13/2023 in [facility] Office Visit from 03/10/2023 in [facility] Video Visit from 09/14/2022 in [facility]  PHQ-2 Total Score 0 3 6 1 1   PHQ-9 Total Score 4 14 22 7  --      Flowsheet Row UC from 05/23/2024 in [facility] Video Visit from 01/26/2024 in [facility] Office Visit from 11/04/2023 in [facility]  C-SSRS RISK CATEGORY No Risk No Risk Error: Q3, 4, or 5 should not be populated when Q2 is No       Assessment and Plan: as follows   Prior documentation reviewed    Bipolar disorder current episode depressed :  Improved less withdrawn.  Continue Latuda  there is no tremors reported she understands to get a EKG by her primary care physician   Labs being monitored by PCP as well Continue sleep hygiene  Generalized anxiety; better continue gabapentin  up to 3 times a day she is not on Inderal  and feels more alert  ADHD; manageable with Adderall discussed and reviewed side effects no palpitations insomnia   Insomnia; improving continue work on sleep hygiene she seldom takes trazodone    Fu 3 m. Renewed meds which were due   Collaboration of Care: Psychiatrist AEB [facility] providers chart reviewed   Patient/Guardian was advised Release of Information must be obtained prior to any record release in order to collaborate their care with an outside provider. Patient/Guardian was advised if they have not already done so to contact the registration department to sign all necessary forms in order for us  to release information regarding their care.   Consent: Patient/Guardian gives verbal consent for treatment and assignment of benefits for services provided during this visit. Patient/Guardian expressed understanding and agreed to proceed.   Wray Heady, MD 6/11/20251:11 PM

## 2024-06-07 ENCOUNTER — Ambulatory Visit: Payer: MEDICAID | Attending: Family Medicine

## 2024-06-11 ENCOUNTER — Encounter: Payer: MEDICAID | Admitting: Family Medicine

## 2024-06-20 ENCOUNTER — Ambulatory Visit (INDEPENDENT_AMBULATORY_CARE_PROVIDER_SITE_OTHER): Payer: MEDICAID | Admitting: Nurse Practitioner

## 2024-06-20 ENCOUNTER — Encounter: Payer: Self-pay | Admitting: Nurse Practitioner

## 2024-06-20 VITALS — BP 119/83 | HR 86 | Temp 98.4°F | Ht 72.0 in | Wt 264.0 lb

## 2024-06-20 DIAGNOSIS — R7401 Elevation of levels of liver transaminase levels: Secondary | ICD-10-CM | POA: Diagnosis not present

## 2024-06-20 DIAGNOSIS — E66812 Obesity, class 2: Secondary | ICD-10-CM | POA: Diagnosis not present

## 2024-06-20 DIAGNOSIS — Z6835 Body mass index (BMI) 35.0-35.9, adult: Secondary | ICD-10-CM | POA: Diagnosis not present

## 2024-06-20 MED ORDER — WEGOVY 1.7 MG/0.75ML ~~LOC~~ SOAJ
1.7000 mg | SUBCUTANEOUS | 0 refills | Status: DC
Start: 2024-06-20 — End: 2024-07-12

## 2024-06-20 NOTE — Progress Notes (Signed)
 Office: [phone]  /  Fax: [phone]  WEIGHT SUMMARY AND BIOMETRICS  Weight Lost Since Last Visit: 6lb  Weight Gained Since Last Visit: 0lb   Vitals Temp: 98.4 F (36.9 C) BP: 119/83 Pulse Rate: 86 SpO2: 99 %   Anthropometric Measurements Height: 6' (1.829 m) Weight: 264 lb (119.7 kg) BMI (Calculated): 35.8 Weight at Last Visit: 270lb Weight Lost Since Last Visit: 6lb Weight Gained Since Last Visit: 0lb Starting Weight: 288lb Total Weight Loss (lbs): 24 lb (10.9 kg)   Body Composition  Body Fat %: 45.8 % Fat Mass (lbs): 121 lbs Muscle Mass (lbs): 136.2 lbs Total Body Water (lbs): 92.8 lbs Visceral Fat Rating : 10   Other Clinical Data Fasting: No Labs: No Today's Visit #: 13 Starting Date: 04/06/23     HPI  Chief Complaint: OBESITY  Wanda Torres is here to discuss her progress with her obesity treatment plan. She is on the the Category 4 Plan and states she is following her eating plan approximately 60 % of the time. She states she is exercising 30 minutes 3 days per week.   Interval History:  Since last office visit she has lost 6 pounds.  She is struggling with sugar cravings, denies polyphagia since starting Wegovy .  She has increased her protein intake.  She is drinking water daily and occ wine. She recently joined the [employer].    Her highest weight was 320 lbs.    Pharmacotherapy for weight loss: She is currently taking Wegovy  1.7 mg doses for medical weight loss.  Denies side effects-hair loss has improved.   She has lost a total of 26 pounds since starting Wegovy  on 02/01/24   Wegovy  has been approved from 02/01/24-07/31/24    Previous pharmacotherapy for medical weight loss:  none  Elevated Vit B12 She has been taking Vit B12 OTC but stopped when she saw her lab results.  She has a history of Vit B12 def and has taken OTC and injections in the past.  She is taking a MVI.     Vit D deficiency  She stopped taking Vit D 50,000 IU weekly the end  of May.  Denies side effects.  Denies nausea, vomiting or muscle weakness.    Lab Results  Component Value Date   VD25OH 79.2 05/23/2024   VD25OH 41.3 03/10/2023   VD25OH 37.1 05/01/2018     PHYSICAL EXAM:  Blood pressure 119/83, pulse 86, temperature 98.4 F (36.9 C), height 6' (1.829 m), weight 264 lb (119.7 kg), last menstrual period 06/13/2024, SpO2 99%. Body mass index is 35.8 kg/m.  General: She is overweight, cooperative, alert, well developed, and in no acute distress. PSYCH: Has normal mood, affect and thought process.   Extremities: No edema.  Neurologic: No gross sensory or motor deficits. No tremors or fasciculations noted.    DIAGNOSTIC DATA REVIEWED:  BMET    Component Value Date/Time   NA 140 05/23/2024 1212   K 4.4 05/23/2024 1212   CL 102 05/23/2024 1212   CO2 22 05/23/2024 1212   GLUCOSE 84 05/23/2024 1212   GLUCOSE 63 (L) 09/15/2020 2229   BUN 6 05/23/2024 1212   CREATININE 0.89 05/23/2024 1212   CALCIUM 9.8 05/23/2024 1212   GFRNONAA >60 09/15/2020 2229   GFRAA >60 09/15/2020 2229   Lab Results  Component Value Date   HGBA1C 4.8 03/10/2023   HGBA1C 4.4 (L) 07/09/2020   Lab Results  Component Value Date   INSULIN  6.0 04/06/2023   Lab Results  Component Value Date   TSH 2.100 04/05/2024   CBC    Component Value Date/Time   WBC 5.3 03/10/2023 1438   WBC 7.2 09/15/2020 2229   RBC 4.49 03/10/2023 1438   RBC 4.78 09/15/2020 2229   HGB 15.0 03/10/2023 1438   HCT 43.9 03/10/2023 1438   PLT 254 03/10/2023 1438   MCV 98 (H) 03/10/2023 1438   MCH 33.4 (H) 03/10/2023 1438   MCH 33.7 09/15/2020 2229   MCHC 34.2 03/10/2023 1438   MCHC 35.8 09/15/2020 2229   RDW 13.8 03/10/2023 1438   Iron Studies    Component Value Date/Time   IRON 79 05/01/2018 1740   Lipid Panel     Component Value Date/Time   CHOL 152 05/23/2024 1212   TRIG 137 05/23/2024 1212   HDL 42 05/23/2024 1212   CHOLHDL 2.8 09/19/2023 1017   CHOLHDL 2.2 07/09/2020 1745    VLDL 29 07/09/2020 1745   LDLCALC 86 05/23/2024 1212   Hepatic Function Panel     Component Value Date/Time   PROT 7.0 05/23/2024 1212   ALBUMIN 4.5 05/23/2024 1212   AST 24 05/23/2024 1212   ALT 38 (H) 05/23/2024 1212   ALKPHOS 66 05/23/2024 1212   BILITOT 0.6 05/23/2024 1212   BILIDIR 0.1 07/09/2020 1745   IBILI 0.6 07/09/2020 1745      Component Value Date/Time   TSH 2.100 04/05/2024 1349   Nutritional Lab Results  Component Value Date   VD25OH 79.2 05/23/2024   VD25OH 41.3 03/10/2023   VD25OH 37.1 05/01/2018     ASSESSMENT AND PLAN  TREATMENT PLAN FOR OBESITY:  Recommended Dietary Goals  Wanda Torres is currently in the action stage of change. As such, her goal is to continue weight management plan. She has agreed to the Category 4 Plan.  Behavioral Intervention  We discussed the following Behavioral Modification Strategies today: increasing lean protein intake to established goals, decreasing simple carbohydrates , increasing vegetables, increasing fiber rich foods, increasing water intake , work on meal planning and preparation, reading food labels , keeping healthy foods at home, and continue to work on maintaining a reduced calorie state, getting the recommended amount of protein, incorporating whole foods, making healthy choices, staying well hydrated and practicing mindfulness when eating..  Additional resources provided today: NA  Recommended Physical Activity Goals  Wanda Torres has been advised to work up to 150 minutes of moderate intensity aerobic activity a week and strengthening exercises 2-3 times per week for cardiovascular health, weight loss maintenance and preservation of muscle mass.   She has agreed to Think about enjoyable ways to increase daily physical activity and overcoming barriers to exercise, Increase physical activity in their day and reduce sedentary time (increase NEAT)., and continue to gradually increase the amount and intensity of  exercise routine   Pharmacotherapy We discussed various medication options to help Wanda Torres with her weight loss efforts and we both agreed to continue Wegovy  1.7mg . Side effects discussed.  ASSOCIATED CONDITIONS ADDRESSED TODAY  Action/Plan  Elevated ALT measurement Will recheck labs at next visit.  Avoid OTC meds and alcohol 2 weeks prior to next visit.  Patient reports drinking wine prior to her last labs.    Obesity, Class II, BMI 35-39.9 -     Wegovy ; Inject 1.7 mg into the skin once a week.  Dispense: 3 mL; Refill: 0     Patient has stopped weekly Vit D and Vit B12, started a MVI  Labs reviewed with patient from 05/23/2024.  Goals:  Increase water intake Resistance training Increase protein intake   Return in about 4 weeks (around 07/18/2024).SABRA She was informed of the importance of frequent follow up visits to maximize her success with intensive lifestyle modifications for her multiple health conditions.   ATTESTASTION STATEMENTS:  Reviewed by clinician on day of visit: allergies, medications, problem list, medical history, surgical history, family history, social history, and previous encounter notes.     Corean SAUNDERS. Xhaiden Coombs FNP-C

## 2024-06-20 NOTE — Therapy (Deleted)
 OUTPATIENT PHYSICAL THERAPY THORACOLUMBAR TREATMENT PHYSICAL THERAPY DISCHARGE SUMMARY  Visits from Start of Care: 2  Current functional level related to goals / functional outcomes: See progress note for discharge status    Remaining deficits: Needs to continue core strengthening    Education / Equipment: HEP   Patient agrees to discharge. Patient goals were met. Patient is being discharged due to meeting the stated rehab goals.  Celyn P. Ina PT, MPH 06/20/24 5:26 PM   Patient Name: Wanda Torres MRN: [id number] DOB:June 27, 1991, 33 y.o., female Today's Date: 06/20/2024  END OF SESSION:    Past Medical History:  Diagnosis Date   Acne    ADD (attention deficit disorder)    Alcohol abuse    Allergy    Anemia    Anxiety    ASCUS with positive high risk human papillomavirus of vagina 05/2022   Bipolar 1 disorder (HCC)    Depression    Depression    Drug use    Hypothyroidism    Joint pain    PCOS (polycystic ovarian syndrome)    Psoriasis    Supervision of other high risk pregnancy, antepartum 01/19/2019   Formatting of this note might be different from the original. Partner prefers She/Her pronouns   Swelling of lower extremity    Vitamin B 12 deficiency    Vitamin D  deficiency    Past Surgical History:  Procedure Laterality Date   COLPOSCOPY  07/2022   ENDOCERVICAL MUCOSA WITH CHRONIC CERVICITIS.   NO SQUAMOUS MUCOSA IDENTIFIED.   NO EVIDENCE OF DYSPLASIA OR MALIGNANCY.   Patient Active Problem List   Diagnosis Date Noted   Lumbar degenerative disc disease 06/01/2024   Elevated ALT measurement 05/24/2024   Tachycardia 03/07/2024   Long term current use of antipsychotic medication 07/29/2023   BMI 39.0-39.9,adult 04/06/2023   B12 deficiency 04/06/2023   Hypertriglyceridemia 04/06/2023   Other fatigue 04/06/2023   Generalized anxiety disorder 05/28/2021   Bipolar disorder, current episode mixed, moderate (HCC) 03/26/2021   Mixed bipolar affective  disorder (HCC) 03/26/2021   Insomnia 10/25/2020   S/P right knee surgery 01/02/2020   Rupture of anterior cruciate ligament of right knee 12/06/2019   Attention deficit hyperactivity disorder (ADHD), predominantly inattentive type 10/17/2016   PCOS (polycystic ovarian syndrome) 03/26/2016    PCP: Dr Torrence CINDERELLA Barrier   REFERRING PROVIDER:  Dr Torrence CINDERELLA Barrier  REFERRING DIAG: Acute bilat LBP  Rationale for Evaluation and Treatment: Rehabilitation  THERAPY DIAG:  No diagnosis found.  ONSET DATE: 03/10/24  SUBJECTIVE:  SUBJECTIVE STATEMENT: Patient has been off prednisone for a week and has had no significant back pain; states she had mild LBP 3 days ago but thinks it was due to the chair she was sitting in.   PERTINENT HISTORY:  Thyroid disorder; denies any back problems   PAIN:  Are you having pain? Yes: NPRS scale: 0/10; 4/10 in the past week  Pain location: bilat LB  Pain description: stabbing; radiating  Aggravating factors: sitting; driving; walking > 5 min  Relieving factors: meds; heat   PRECAUTIONS: None  RED FLAGS: None   WEIGHT BEARING RESTRICTIONS: No  FALLS:  Has patient fallen in last 6 months? 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  DIAGNOSTIC FINDINGS:  Xrays 04/05/24: There is no evidence of lumbar spine or pelvic fracture. Scoliosis. Intervertebral disc spaces are maintained. Minimal anterior spurring noted at L4 and L2. Minimal degenerative joint changes with sclerosis at the inferior left sacroiliac joint.   IMPRESSION: 1. Minimal degenerative joint changes of left sacroiliac joint. 2. Minimal degenerative joint changes of lumbar spine.  PATIENT SURVEYS:  Modified Oswestry 20/50; 40%    COGNITION: Overall cognitive status: Within functional limits for tasks assessed     SENSATION: WFL  MUSCLE LENGTH: Hamstrings: Right 75 deg; Left 80 deg Thomas test: Right tighter > Left   POSTURE: rounded shoulders, forward head, flexed trunk , and minimal scoliosis noted in standing   PALPATION: Pain with spring testing sacrum, lower lumbar and bilat SI joints   LUMBAR ROM:   AROM eval  Flexion 45% pain  Extension 45% pain   Right lateral flexion 65% pull L  Left lateral flexion 65% pull R   Right rotation 40% pull L  Left rotation 40% pull R   (Blank rows = not tested)  LOWER EXTREMITY ROM:     Active  Right eval Left eval  Hip flexion Tight  Tight   Hip extension    Hip abduction    Hip adduction    Hip internal rotation Tight    Hip external rotation Tight    Knee flexion    Knee extension    Ankle dorsiflexion    Ankle plantarflexion    Ankle inversion    Ankle eversion     (Blank rows = not tested)  LOWER EXTREMITY MMT:      Patient demonstrates drop of opposite hip with SLS on R/L   MMT Right eval Left eval Right 04/26/24 Left 5/1//25  Hip flexion   5 5  Hip extension 4 4+ 5 5  Hip abduction 4- 4 5 5   Hip adduction      Hip internal rotation      Hip external rotation      Knee flexion      Knee extension      Ankle dorsiflexion      Ankle plantarflexion  Ankle inversion      Ankle  eversion       (Blank rows = not tested)  LUMBAR SPECIAL TESTS:  Straight leg raise test: Negative and Slump test: Negative  FUNCTIONAL TESTS:   SLS - 10 sec R/L   Heel raise/toe raise bilat without difficulty Sit to stand without use of UEs - no difficulty and no pain    GAIT: Distance walked: 40 ft Assistive device utilized: None Level of assistance: Complete Independence Comments: flexed through upper body; LEs in ER with gait    [facility] Adult PT Treatment:                                                DATE: 04/26/2024 Therapeutic Exercise: Reviewed STG & LTG --> has met everything   TREATMENT DATE: 04/17/24 Review of pathology and physical findings Ergonomic recommendations  HEP - see below                                                                                                                                  PATIENT EDUCATION:  Education details: POC; HEP  Person educated: Patient Education method: Programmer, multimedia, Demonstration, Tactile cues, Verbal cues, and Handouts Education comprehension: verbalized understanding, returned demonstration, verbal cues required, tactile cues required, and needs further education  HOME EXERCISE PROGRAM: Access Code: [email or web address] URL: [email or web address] Date: 04/17/2024 Prepared by: Celyn Holt  Exercises - Prone Press Up  - 2 x daily - 7 x weekly - 1 sets - 10 reps - 2-3 sec  hold - Prone Press Up On Elbows  - 2 x daily - 7 x weekly - 1 sets - 3 reps - 30 sec  hold - Standing Lumbar Extension  - 2 x daily - 7 x weekly - 1 sets - 2-3 reps - 2-3 sec  hold - Supine Transversus Abdominis Bracing with Pelvic Floor Contraction  - 2 x daily - 7 x weekly - 1 sets - 10 reps - 10sec  hold - Supine Piriformis Stretch with Leg Straight  - 2 x daily - 7 x weekly - 1 sets - 3 reps - 30 sec  hold - Sidelying Hip Abduction  - 1 x daily - 7 x weekly - 3 sets - 10 reps - 3-5 sec  hold - Hip Flexor Stretch at Edge of Bed  - 2 x daily -  7 x weekly - 1 sets - 3 reps - 30 sec  hold - Standing Piriformis Release with Ball at [employer]  - 2 x daily - 7 x weekly - 30-60 sec  hold  ASSESSMENT:  CLINICAL IMPRESSION: Patient has been pain-free for one week since finishing prednisone. Patient has met all short- term and long-term goals in POC and has returned to working with personal trainer 3 x  week with no pain. Patient is in agreement to discharge from PT.    OBJECTIVE IMPAIRMENTS: Abnormal gait, decreased activity tolerance, decreased ROM, decreased strength, increased fascial restrictions, impaired flexibility, improper body mechanics, postural dysfunction, and pain.   ACTIVITY LIMITATIONS: carrying, lifting, sitting, standing, and locomotion level  PARTICIPATION LIMITATIONS: meal prep, cleaning, laundry, driving, shopping, and community activity  PERSONAL FACTORS: Fitness and Time since onset of injury/illness/exacerbation are also affecting patient's functional outcome.   REHAB POTENTIAL: Good  CLINICAL DECISION MAKING: Evolving/moderate complexity  EVALUATION COMPLEXITY: Moderate   GOALS: Goals reviewed with patient? Yes  SHORT TERM GOALS: Target date: 05/15/2024  Independent in initial HEP  Baseline: Goal status: INITIAL  2.  Patient demonstrates and reports proper standing and sitting posture and alignment; modifying recliner position for rest  Baseline:  Goal status: MET  3.  Improve core, patient to return to training with personal trainer with no increase in pain  Baseline:  Goal status: MET   LONG TERM GOALS: Target date: 06/12/2024   Patient reports return to all normal functional and recreational activities without pain Baseline:  Goal status: MET  2.  Pain level 0/10 with no greater that 1-2/10 with irritating activities (prolonged sitting, standing, walking  Baseline:  Goal status: MET  3.  Hip strength 5/5 bilat  Baseline:  Goal status: MET  4.  Independent in HEP including return to gym  program three days/wk  Baseline:  Goal status: MET  5.  Improve Owestry score by 10-15 points   Baseline: 20/50; 40% 04/26/24: 1/50 = 2% Goal status: MET    PLAN:  PT FREQUENCY: 2x/week  PT DURATION: 8 weeks  PLANNED INTERVENTIONS: 97110-Therapeutic exercises, 97530- Therapeutic activity, 97112- Neuromuscular re-education, 97535- Self Care, 02859- Manual therapy, [phone]- Gait training, [phone]- Aquatic Therapy, Patient/Family education, Balance training, Taping, Dry Needling, and Joint mobilization.  PLAN FOR NEXT SESSION: Discharge  Mariska Daffin, PT 06/20/2024, 5:26 PM

## 2024-06-21 ENCOUNTER — Ambulatory Visit: Payer: MEDICAID | Attending: Family Medicine | Admitting: Physical Therapy

## 2024-06-21 DIAGNOSIS — M6281 Muscle weakness (generalized): Secondary | ICD-10-CM | POA: Insufficient documentation

## 2024-06-21 DIAGNOSIS — R29898 Other symptoms and signs involving the musculoskeletal system: Secondary | ICD-10-CM | POA: Insufficient documentation

## 2024-06-21 DIAGNOSIS — M5459 Other low back pain: Secondary | ICD-10-CM | POA: Insufficient documentation

## 2024-06-27 ENCOUNTER — Telehealth (HOSPITAL_COMMUNITY): Payer: Self-pay | Admitting: *Deleted

## 2024-06-27 MED ORDER — AMPHETAMINE-DEXTROAMPHET ER 20 MG PO CP24: 20.0000 mg | ORAL_CAPSULE | Freq: Every day | ORAL | 0 refills | Status: DC

## 2024-06-27 NOTE — Addendum Note (Signed)
 Addended by: GERALENE KAISER on: 06/27/2024 11:18 AM   Modules accepted: Orders

## 2024-06-27 NOTE — Telephone Encounter (Signed)
 Patient Request [facility] #[id number] - [place], [place] - [address]   amphetamine -dextroamphetamine  (ADDERALL XR) 20 MG 24 hr capsule  NEXT APPT 09/05/24  LAST APPT   06/06/24

## 2024-07-04 ENCOUNTER — Other Ambulatory Visit (HOSPITAL_COMMUNITY): Payer: Self-pay | Admitting: *Deleted

## 2024-07-04 ENCOUNTER — Telehealth (HOSPITAL_COMMUNITY): Payer: Self-pay | Admitting: *Deleted

## 2024-07-04 DIAGNOSIS — F314 Bipolar disorder, current episode depressed, severe, without psychotic features: Secondary | ICD-10-CM

## 2024-07-04 MED ORDER — BUPROPION HCL ER (SR) 100 MG PO TB12: 100.0000 mg | ORAL_TABLET | Freq: Two times a day (BID) | ORAL | 1 refills | Status: DC

## 2024-07-04 NOTE — Telephone Encounter (Signed)
 Patient called for Refill buPROPion  ER (WELLBUTRIN  SR) 100 MG 12 hr tablet   [facility] #[id number] - [place], [place] - [address]   NEXT APPT 09/05/24  LAST APPT   06/06/24

## 2024-07-04 NOTE — Telephone Encounter (Signed)
 Spoke with patient  & she stated that -- Take 1 capsule (100 mg total) by mouth  3 (three) times daily

## 2024-07-04 NOTE — Telephone Encounter (Signed)
 Provider Authorized  Refill She is not supposed to take more then bid. You can send as twice a day, ill co sign for wellbutrin  R:1 Attached to script   SENT CO SIGN

## 2024-07-04 NOTE — Addendum Note (Signed)
 Addended by: Mayetta Castleman on: 07/04/2024 12:49 PM   Modules accepted: Orders

## 2024-07-12 ENCOUNTER — Encounter: Payer: Self-pay | Admitting: Nurse Practitioner

## 2024-07-12 ENCOUNTER — Ambulatory Visit: Payer: MEDICAID | Admitting: Nurse Practitioner

## 2024-07-12 VITALS — BP 109/77 | HR 78 | Temp 97.7°F | Ht 72.0 in | Wt 262.0 lb

## 2024-07-12 DIAGNOSIS — R7401 Elevation of levels of liver transaminase levels: Secondary | ICD-10-CM | POA: Diagnosis not present

## 2024-07-12 DIAGNOSIS — E66812 Obesity, class 2: Secondary | ICD-10-CM | POA: Diagnosis not present

## 2024-07-12 DIAGNOSIS — Z6835 Body mass index (BMI) 35.0-35.9, adult: Secondary | ICD-10-CM | POA: Diagnosis not present

## 2024-07-12 MED ORDER — WEGOVY 1.7 MG/0.75ML ~~LOC~~ SOAJ: 1.7000 mg | SUBCUTANEOUS | 1 refills | Status: DC

## 2024-07-12 NOTE — Progress Notes (Signed)
 Office: [phone]  /  Fax: [phone]  WEIGHT SUMMARY AND BIOMETRICS  Weight Lost Since Last Visit: 2lb  Weight Gained Since Last Visit: 0lb   Vitals Temp: 97.7 F (36.5 C) BP: 109/77 Pulse Rate: 78 SpO2: 96 %   Anthropometric Measurements Height: 6' (1.829 m) Weight: 262 lb (118.8 kg) BMI (Calculated): 35.53 Weight at Last Visit: 264lb Weight Lost Since Last Visit: 2lb Weight Gained Since Last Visit: 0lb Starting Weight: 288lb Total Weight Loss (lbs): 26 lb (11.8 kg)   Body Composition  Body Fat %: 45.1 % Fat Mass (lbs): 118.2 lbs Muscle Mass (lbs): 136.6 lbs Total Body Water (lbs): 92.4 lbs Visceral Fat Rating : 10   Other Clinical Data Fasting: Yes Labs: No Today's Visit #: 14 Starting Date: 04/06/23     HPI  Chief Complaint: OBESITY  Wanda Torres is here to discuss her progress with her obesity treatment plan. She is on the the Category 4 Plan and states she is following her eating plan approximately 60 % of the time. She states she is exercising 45-60 minutes 3 days per week.   Interval History:  Since last office visit she has lost 2 pounds. She is drinking water and hard seltzer. She stopped using her personal trainer and joined the [employer].  She is going to the gym 3 days per week-2 days cardio and resistance training and 1 day barre class.  Her highest weight was 317 lbs.    Pharmacotherapy for weight loss: She is currently taking Wegovy  1.7 mg doses for medical weight loss.  Hair loss has improved. Occ notes some headache the day of injection. Takes motrin with relief.     She has lost a total of 26 pounds since starting Wegovy  on 02/01/24   Wegovy  has been approved from 02/01/24-07/31/24    Previous pharmacotherapy for medical weight loss:  none   Elevated liver enzymes Notes did drink over the weekend and would like to hold off on obtaining labs today.  Denies abd pain.    PHYSICAL EXAM:  Blood pressure 109/77, pulse 78, temperature 97.7  F (36.5 C), height 6' (1.829 m), weight 262 lb (118.8 kg), last menstrual period 06/13/2024, SpO2 96%. Body mass index is 35.53 kg/m.  General: She is overweight, cooperative, alert, well developed, and in no acute distress. PSYCH: Has normal mood, affect and thought process.   Extremities: No edema.  Neurologic: No gross sensory or motor deficits. No tremors or fasciculations noted.    DIAGNOSTIC DATA REVIEWED:  BMET    Component Value Date/Time   NA 140 05/23/2024 1212   K 4.4 05/23/2024 1212   CL 102 05/23/2024 1212   CO2 22 05/23/2024 1212   GLUCOSE 84 05/23/2024 1212   GLUCOSE 63 (L) 09/15/2020 2229   BUN 6 05/23/2024 1212   CREATININE 0.89 05/23/2024 1212   CALCIUM 9.8 05/23/2024 1212   GFRNONAA >60 09/15/2020 2229   GFRAA >60 09/15/2020 2229   Lab Results  Component Value Date   HGBA1C 4.8 03/10/2023   HGBA1C 4.4 (L) 07/09/2020   Lab Results  Component Value Date   INSULIN  6.0 04/06/2023   Lab Results  Component Value Date   TSH 2.100 04/05/2024   CBC    Component Value Date/Time   WBC 5.3 03/10/2023 1438   WBC 7.2 09/15/2020 2229   RBC 4.49 03/10/2023 1438   RBC 4.78 09/15/2020 2229   HGB 15.0 03/10/2023 1438   HCT 43.9 03/10/2023 1438   PLT 254 03/10/2023 1438  MCV 98 (H) 03/10/2023 1438   MCH 33.4 (H) 03/10/2023 1438   MCH 33.7 09/15/2020 2229   MCHC 34.2 03/10/2023 1438   MCHC 35.8 09/15/2020 2229   RDW 13.8 03/10/2023 1438   Iron Studies    Component Value Date/Time   IRON 79 05/01/2018 1740   Lipid Panel     Component Value Date/Time   CHOL 152 05/23/2024 1212   TRIG 137 05/23/2024 1212   HDL 42 05/23/2024 1212   CHOLHDL 2.8 09/19/2023 1017   CHOLHDL 2.2 07/09/2020 1745   VLDL 29 07/09/2020 1745   LDLCALC 86 05/23/2024 1212   Hepatic Function Panel     Component Value Date/Time   PROT 7.0 05/23/2024 1212   ALBUMIN 4.5 05/23/2024 1212   AST 24 05/23/2024 1212   ALT 38 (H) 05/23/2024 1212   ALKPHOS 66 05/23/2024 1212    BILITOT 0.6 05/23/2024 1212   BILIDIR 0.1 07/09/2020 1745   IBILI 0.6 07/09/2020 1745      Component Value Date/Time   TSH 2.100 04/05/2024 1349   Nutritional Lab Results  Component Value Date   VD25OH 79.2 05/23/2024   VD25OH 41.3 03/10/2023   VD25OH 37.1 05/01/2018     ASSESSMENT AND PLAN  TREATMENT PLAN FOR OBESITY:  Recommended Dietary Goals  Wanda Torres is currently in the action stage of change. As such, her goal is to continue weight management plan. She has agreed to the Category 4 Plan.  Behavioral Intervention  We discussed the following Behavioral Modification Strategies today: increasing lean protein intake to established goals, decreasing simple carbohydrates , increasing vegetables, increasing fiber rich foods, increasing water intake , and continue to work on maintaining a reduced calorie state, getting the recommended amount of protein, incorporating whole foods, making healthy choices, staying well hydrated and practicing mindfulness when eating..  Additional resources provided today: NA  Recommended Physical Activity Goals  Wanda Torres has been advised to work up to 150 minutes of moderate intensity aerobic activity a week and strengthening exercises 2-3 times per week for cardiovascular health, weight loss maintenance and preservation of muscle mass.   She has agreed to Think about enjoyable ways to increase daily physical activity and overcoming barriers to exercise, Increase physical activity in their day and reduce sedentary time (increase NEAT)., and continue to gradually increase the amount and intensity of exercise routine   Pharmacotherapy We discussed various medication options to help Wanda Torres with her weight loss efforts and we both agreed to continue Wegovy  1.7mg .  Side effects discussed.  ASSOCIATED CONDITIONS ADDRESSED TODAY  Action/Plan  Elevated ALT measurement Will obtain labs at next visit  Obesity, Class II, BMI 35-39.9 -     Wegovy ;  Inject 1.7 mg into the skin once a week.  Dispense: 3 mL; Refill: 1     Patient has stopped weekly Vit D and Vit B12, taking a MVI   Goals: Increase water intake Resistance training Increase protein intake      Return in about 8 weeks (around 09/06/2024).Wanda Torres She was informed of the importance of frequent follow up visits to maximize her success with intensive lifestyle modifications for her multiple health conditions.   ATTESTASTION STATEMENTS:  Reviewed by clinician on day of visit: allergies, medications, problem list, medical history, surgical history, family history, social history, and previous encounter notes.     Wanda Torres. Wanda Rosen FNP-C

## 2024-07-13 ENCOUNTER — Ambulatory Visit: Payer: MEDICAID | Admitting: Sports Medicine

## 2024-07-16 ENCOUNTER — Ambulatory Visit: Payer: MEDICAID | Admitting: Obstetrics & Gynecology

## 2024-07-16 ENCOUNTER — Other Ambulatory Visit (HOSPITAL_COMMUNITY)
Admission: RE | Admit: 2024-07-16 | Discharge: 2024-07-16 | Disposition: A | Discharge status: Home / Self Care | Payer: MEDICAID | Source: Ambulatory Visit | Attending: Obstetrics & Gynecology | Admitting: Obstetrics & Gynecology

## 2024-07-16 ENCOUNTER — Encounter: Payer: Self-pay | Admitting: Obstetrics & Gynecology

## 2024-07-16 VITALS — BP 111/77 | HR 96 | Ht 72.0 in | Wt 267.0 lb

## 2024-07-16 DIAGNOSIS — B3731 Acute candidiasis of vulva and vagina: Secondary | ICD-10-CM | POA: Diagnosis not present

## 2024-07-16 DIAGNOSIS — Z01419 Encounter for gynecological examination (general) (routine) without abnormal findings: Secondary | ICD-10-CM | POA: Insufficient documentation

## 2024-07-16 DIAGNOSIS — N9489 Other specified conditions associated with female genital organs and menstrual cycle: Secondary | ICD-10-CM | POA: Diagnosis not present

## 2024-07-16 DIAGNOSIS — Z1331 Encounter for screening for depression: Secondary | ICD-10-CM | POA: Diagnosis not present

## 2024-07-16 MED ORDER — CLOTRIMAZOLE-BETAMETHASONE 1-0.05 % EX CREA: TOPICAL_CREAM | CUTANEOUS | 1 refills | Status: AC

## 2024-07-16 MED ORDER — NYSTATIN 100000 UNIT/GM EX POWD: CUTANEOUS | 0 refills | Status: AC

## 2024-07-16 NOTE — Progress Notes (Signed)
 Subjective:     Wanda Torres is a 33 y.o. female here for a routine exam.  Current complaints: dyspaurenia and burning (like a thousand little cuts) after intercourse esp when she urinates.  Occurred for past 6 months and getting worse. Has regular cycles once a month with molimina   Gynecologic History Patient's last menstrual period was 06/13/2024 (approximate). 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 2019 pap smear is nml cytology at [facility] Last mammogram: none.   Obstetric History OB History  Gravida Para Term Preterm AB Living  4 3 2 1 1 3   SAB IAB Ectopic Multiple Live Births   1   3    # Outcome Date GA Lbr Len/2nd Weight Sex Type Anes PTL Lv  4 Term 03/15/19    F Vag-Spont   LIV  3 Term 07/30/17    F Vag-Spont   LIV  2 Preterm 09/02/10    F Vag-Spont   LIV  1 IAB              The following portions of the patient's history were reviewed and updated as appropriate: allergies, current medications, past family history, past medical history, past social history, past surgical history, and problem list.  Review of Systems Pertinent items noted in HPI and remainder of comprehensive ROS otherwise negative.    Objective:     Vitals:   07/16/24 1403  BP: 111/77  Pulse: 96  Weight: 267 lb (121.1 kg)  Height: 6' (1.829 m)   Vitals:  WNL General appearance: alert, cooperative and no distress  HEENT: Normocephalic, without obvious abnormality, atraumatic Eyes: negative Throat: lips, mucosa, and tongue normal; teeth and gums normal  Respiratory: Clear to auscultation bilaterally  CV: Regular rate and rhythm  Breasts:  Normal appearance, no masses or tenderness, no nipple retraction or dimpling  GI: Soft, non-tender; bowel sounds normal; no masses,  no organomegaly  GU: External Genitalia:  Tanner V, red c/w yeast in intertriginous  Urethra:  No prolapse   Vagina: Pink, normal rugae, no blood or  discharge; use long graves speculum  Cervix: No CMT, no lesion  Uterus:  Normal size and contour, non tender  Adnexa: Normal, no masses, non tender  Musculoskeletal: No edema, redness or tenderness in the calves or thighs  Skin: No lesions or rash  Lymphatic: Axillary adenopathy: none     Psychiatric: Normal mood and behavior        Assessment:    Healthy female exam.    Plan:    Pap with co testing Intertriginous yeast--Lotrisone  and nystatin  powder Reduce vulvar irritants--scent and dye free soaps; change to Cetaphil or Dove for bathing.  No douching or creams other than prescribed here.  Will check for BV and yeast today.  Uberlube samples given RTC 6 weeks.

## 2024-07-16 NOTE — Progress Notes (Signed)
 Last pap smear (date and result): 05/2022 ASCUS HPV, 07/2022 Colposcopy  Last mammogram (date and result):n/a Last colon screening (date and result):n/a Brush:yes Floss:yes Seatbelts: yes Sunscreen: yes  Silvano LELON Piano, RN

## 2024-07-17 ENCOUNTER — Ambulatory Visit: Payer: Self-pay | Admitting: Obstetrics & Gynecology

## 2024-07-17 ENCOUNTER — Other Ambulatory Visit: Payer: Self-pay | Admitting: Obstetrics & Gynecology

## 2024-07-17 LAB — CERVICOVAGINAL ANCILLARY ONLY
Bacterial Vaginitis (gardnerella): POSITIVE — AB
Candida Glabrata: NEGATIVE
Candida Vaginitis: NEGATIVE
Comment: NEGATIVE
Comment: NEGATIVE
Comment: NEGATIVE

## 2024-07-17 MED ORDER — METRONIDAZOLE 500 MG PO TABS: 500.0000 mg | ORAL_TABLET | Freq: Two times a day (BID) | ORAL | 0 refills | Status: DC

## 2024-07-20 LAB — CYTOLOGY - PAP
Comment: NEGATIVE
Comment: NEGATIVE
Comment: NEGATIVE
HPV 16: NEGATIVE
HPV 18 / 45: NEGATIVE
High risk HPV: POSITIVE — AB

## 2024-07-23 ENCOUNTER — Telehealth (HOSPITAL_COMMUNITY): Payer: Self-pay

## 2024-07-23 NOTE — Telephone Encounter (Signed)
 Medication management - Called patient back, after she left a message reporting she needed a new Latuda  order and after speaking with her [facility]. Informed patient Dr. Geralene had sent in a new 90 day order on 06/06/24 that her pharmacy had on file, however, her pharmacy was currently out of this medication but reported they would have it in and fill for her on 07/24/24.  Patient stated understanding and will let us  know if any problems obtaining needed refill.

## 2024-07-24 ENCOUNTER — Encounter: Payer: Self-pay | Admitting: Nurse Practitioner

## 2024-07-24 ENCOUNTER — Telehealth: Payer: Self-pay

## 2024-07-24 NOTE — Telephone Encounter (Signed)
 PA submitted through Cover My Meds for Wegovy . Awaiting insurance determination. Key: [id number]

## 2024-07-25 ENCOUNTER — Telehealth (HOSPITAL_COMMUNITY): Payer: Self-pay | Admitting: Psychiatry

## 2024-07-25 NOTE — Telephone Encounter (Signed)
 Per Cover My Meds: Your PA has been resolved, no additional PA is required. For further inquiries please contact the number on the back of the member prescription card. (Message [phone])

## 2024-07-25 NOTE — Telephone Encounter (Signed)
 Patient called requesting refill of amphetamine -dextroamphetamine  (ADDERALL XR) 20 MG 24 hr capsule .  [facility] #[id number] - [place], [place] - [address] AT [facility] [address] (Ph: [phone])   Last ordered: 06/27/2024 - 30 capsules Last visit: 06/06/2024 Next visit: 09/05/2024

## 2024-07-26 MED ORDER — AMPHETAMINE-DEXTROAMPHET ER 20 MG PO CP24: 20.0000 mg | ORAL_CAPSULE | Freq: Every day | ORAL | 0 refills | Status: DC

## 2024-08-03 ENCOUNTER — Encounter: Payer: Self-pay | Admitting: Obstetrics & Gynecology

## 2024-08-07 ENCOUNTER — Telehealth: Payer: Self-pay | Admitting: *Deleted

## 2024-08-07 NOTE — Telephone Encounter (Signed)
 Returned call from 3:11 PM. Left patient a message to call and schedule nurse visit for self swab.

## 2024-08-09 ENCOUNTER — Other Ambulatory Visit (HOSPITAL_COMMUNITY)
Admission: RE | Admit: 2024-08-09 | Discharge: 2024-08-09 | Disposition: A | Discharge status: Home / Self Care | Payer: MEDICAID | Source: Ambulatory Visit | Attending: Obstetrics and Gynecology | Admitting: Obstetrics and Gynecology

## 2024-08-09 ENCOUNTER — Ambulatory Visit: Payer: MEDICAID

## 2024-08-09 VITALS — BP 111/77 | HR 90 | Ht 72.0 in | Wt 255.0 lb

## 2024-08-09 DIAGNOSIS — N9489 Other specified conditions associated with female genital organs and menstrual cycle: Secondary | ICD-10-CM | POA: Diagnosis present

## 2024-08-09 DIAGNOSIS — R300 Dysuria: Secondary | ICD-10-CM

## 2024-08-09 NOTE — Progress Notes (Signed)
 SUBJECTIVE:  33 y.o. female complains dysuria after intercourse and vaginal burning. Pt reported tried otc lube which also burned. Denies abnormal vaginal bleeding or significant pelvic pain or fever. Denies history of known exposure to STD.  No LMP recorded.  OBJECTIVE:  She appears alert, well appearing, in no apparent distress Urine dipstick: not done.  ASSESSMENT:  Dysuria and Vaginal Burning   PLAN:  GC, chlamydia, trichomonas, BVAG, CVAG probe and urine culture sent to lab. Treatment: To be determined once lab results are received ROV prn if symptoms persist or worsen.  Silvano LELON Piano, RN

## 2024-08-10 LAB — CERVICOVAGINAL ANCILLARY ONLY
Bacterial Vaginitis (gardnerella): POSITIVE — AB
Candida Glabrata: NEGATIVE
Candida Vaginitis: NEGATIVE
Chlamydia: NEGATIVE
Comment: NEGATIVE
Comment: NEGATIVE
Comment: NEGATIVE
Comment: NEGATIVE
Comment: NEGATIVE
Comment: NORMAL
Neisseria Gonorrhea: NEGATIVE
Trichomonas: NEGATIVE

## 2024-08-12 LAB — URINE CULTURE

## 2024-08-13 ENCOUNTER — Encounter: Payer: Self-pay | Admitting: Obstetrics & Gynecology

## 2024-08-13 ENCOUNTER — Ambulatory Visit: Payer: Self-pay | Admitting: Obstetrics and Gynecology

## 2024-08-13 DIAGNOSIS — B9689 Other specified bacterial agents as the cause of diseases classified elsewhere: Secondary | ICD-10-CM

## 2024-08-13 MED ORDER — METRONIDAZOLE 500 MG PO TABS: 500.0000 mg | ORAL_TABLET | Freq: Two times a day (BID) | ORAL | 0 refills | Status: DC

## 2024-08-22 ENCOUNTER — Telehealth: Payer: Self-pay

## 2024-08-22 ENCOUNTER — Encounter: Payer: Self-pay | Admitting: Nurse Practitioner

## 2024-08-22 NOTE — Telephone Encounter (Signed)
 Received through Cover My Meds:   Your PA request has been approved. Additional information will be provided in the approval communication. (Message [id number]). Authorization Expiration Date: August 22, 2025.

## 2024-08-22 NOTE — Telephone Encounter (Signed)
 PA submitted through Cover My Meds for Wegovy . Awaiting insurance determination. Key: [id number]

## 2024-08-24 ENCOUNTER — Other Ambulatory Visit (HOSPITAL_COMMUNITY): Payer: Self-pay | Admitting: *Deleted

## 2024-08-24 MED ORDER — AMPHETAMINE-DEXTROAMPHET ER 20 MG PO CP24: 20.0000 mg | ORAL_CAPSULE | Freq: Every day | ORAL | 0 refills | Status: DC

## 2024-08-28 ENCOUNTER — Encounter: Payer: Self-pay | Admitting: Sports Medicine

## 2024-08-28 ENCOUNTER — Telehealth (HOSPITAL_COMMUNITY): Payer: Self-pay | Admitting: *Deleted

## 2024-08-28 NOTE — Telephone Encounter (Signed)
 Patient called stating that her regular pharmacy is out of her Adderall and would like it sent to the [facility] on [address] in [place].Adderall was last filled 08-24-2024 with 30 tablets.

## 2024-08-29 MED ORDER — AMPHETAMINE-DEXTROAMPHET ER 20 MG PO CP24: 20.0000 mg | ORAL_CAPSULE | Freq: Every day | ORAL | 0 refills | Status: DC

## 2024-08-29 NOTE — Addendum Note (Signed)
 Addended by: Merlen Gurry on: 08/29/2024 08:32 AM   Modules accepted: Orders

## 2024-08-29 NOTE — Telephone Encounter (Signed)
Spoke with patient and informed her with what provider stated and she verbalized understanding.  °

## 2024-08-30 ENCOUNTER — Encounter: Payer: Self-pay | Admitting: Family Medicine

## 2024-08-30 ENCOUNTER — Ambulatory Visit: Payer: MEDICAID | Admitting: Family Medicine

## 2024-08-30 VITALS — BP 119/78 | HR 89 | Temp 97.9°F | Resp 18 | Ht 72.0 in | Wt 255.6 lb

## 2024-08-30 DIAGNOSIS — K130 Diseases of lips: Secondary | ICD-10-CM

## 2024-08-30 NOTE — Progress Notes (Signed)
 Acute Office Visit  Subjective:     Patient ID: Wanda Torres, female    DOB: [birth year], 33 y.o.   MRN: [id number]  Chief Complaint  Patient presents with   Referral    Patient is here to discuss wanting a referral for dermatology for her lips. She states that for the past 4-5 months she states that her lips have been splitting, hurting, and peeling. She states that she has tried everything but nothing is working    HPI Patient is in today for acute visit.  Cracked lips Pt reports 4 months of cracked peeling lips. She says she stays hydrated by drinking a lot of water. She also avoids sun exposure. Denies constant licking of lips. She has tried lip balms otc and nothing seems to work. They also have splits in her lips which are painful.  Review of Systems  Constitutional:        Cracked lips  All other systems reviewed and are negative.       Objective:    BP 119/78   Pulse 89   Temp 97.9 F (36.6 C) (Oral)   Resp 18   Ht 6' (1.829 m)   Wt 255 lb 9.6 oz (115.9 kg)   LMP 07/23/2024 (Approximate)   SpO2 95%   BMI 34.67 kg/m  BP Readings from Last 3 Encounters:  08/30/24 119/78  08/09/24 111/77  07/16/24 111/77      Physical Exam Vitals and nursing note reviewed.  Constitutional:      Appearance: Normal appearance. She is normal weight.  HENT:     Head: Normocephalic and atraumatic.     Right Ear: External ear normal.     Left Ear: External ear normal.     Nose: Nose normal.     Mouth/Throat:     Pharynx: Oropharynx is clear.     Comments: Dry cracked lips Eyes:     Conjunctiva/sclera: Conjunctivae normal.     Pupils: Pupils are equal, round, and reactive to light.  Cardiovascular:     Rate and Rhythm: Normal rate.  Pulmonary:     Effort: Pulmonary effort is normal.  Skin:    General: Skin is warm.     Capillary Refill: Capillary refill takes less than 2 seconds.  Neurological:     General: No focal deficit present.     Mental Status: She  is alert and oriented to person, place, and time. Mental status is at baseline.  Psychiatric:        Mood and Affect: Mood normal.        Behavior: Behavior normal.        Thought Content: Thought content normal.        Judgment: Judgment normal.    No results found for any visits on 08/30/24.      Assessment & Plan:   Problem List Items Addressed This Visit   None Visit Diagnoses       Cheilosis    -  Primary   Relevant Orders   B12 and Folate Panel   Iron and TIBC   Vitamin B1     Pt presents with 4 month hx of cheilosis. Start with vitamin deficiency screening. If no better, refer to dermatology as requested. Have advised pt on otc lip balms and vaseline to try. Drink a lot of water daily.   No orders of the defined types were placed in this encounter.   Return in about 6 months (around 02/27/2025) for Chronic condition  follow up.  Torrence CINDERELLA Barrier, MD

## 2024-09-03 ENCOUNTER — Other Ambulatory Visit (HOSPITAL_COMMUNITY)
Admission: RE | Admit: 2024-09-03 | Discharge: 2024-09-03 | Disposition: A | Discharge status: Home / Self Care | Payer: MEDICAID | Source: Ambulatory Visit | Attending: Obstetrics & Gynecology | Admitting: Obstetrics & Gynecology

## 2024-09-03 ENCOUNTER — Ambulatory Visit: Payer: MEDICAID | Admitting: Obstetrics & Gynecology

## 2024-09-03 VITALS — BP 114/76 | HR 101 | Ht 72.0 in | Wt 257.0 lb

## 2024-09-03 DIAGNOSIS — R87612 Low grade squamous intraepithelial lesion on cytologic smear of cervix (LGSIL): Secondary | ICD-10-CM | POA: Insufficient documentation

## 2024-09-03 DIAGNOSIS — R87810 Cervical high risk human papillomavirus (HPV) DNA test positive: Secondary | ICD-10-CM

## 2024-09-03 DIAGNOSIS — Z3202 Encounter for pregnancy test, result negative: Secondary | ICD-10-CM

## 2024-09-03 LAB — POCT URINE PREGNANCY: Preg Test, Ur: NEGATIVE

## 2024-09-03 NOTE — Progress Notes (Unsigned)
   Subjective:    Patient ID: Wanda Torres, female    DOB: 04-Oct-1991, 33 y.o.   MRN: [id number]  HPI  Pt presents for f/u o fpainful intercourse / BV, intertrignous yeast and LGSIL / + HPV. BV seems to be gone and had NON painful intercourser this week Yeast has resolved.   Review of Systems  Constitutional: Negative.   Respiratory: Negative.    Cardiovascular: Negative.   Gastrointestinal: Negative.   Genitourinary: Negative.        Objective:   Physical Exam Vitals reviewed.  Constitutional:      General: She is not in acute distress.    Appearance: She is well-developed.  HENT:     Head: Normocephalic and atraumatic.  Eyes:     Conjunctiva/sclera: Conjunctivae normal.  Cardiovascular:     Rate and Rhythm: Normal rate.  Pulmonary:     Effort: Pulmonary effort is normal.  Skin:    General: Skin is warm and dry.  Neurological:     Mental Status: She is alert and oriented to person, place, and time.  Psychiatric:        Mood and Affect: Mood normal.   . Vitals:   09/03/24 1411  BP: 114/76  Pulse: (!) 101  Weight: 257 lb (116.6 kg)  Height: 6' (1.829 m)        Assessment & Plan:  33 yo female with resolved dyspareunia and yeast.  Here for colpo due to LSIL. See separate note.   Colposcopy Procedure Note  Indications: Pap smear 1 months ago showed: low-grade squamous intraepithelial neoplasia (LGSIL - encompassing HPV,mild dysplasia,CIN I). The prior pap showed {Findings; lab pap smear results:16707::no abnormalities}.  Prior cervical/vaginal disease: {:733}. Prior cervical treatment: {Therapies; recommendations colposcopy:727}.  Procedure Details  The risks and benefits of the procedure and {verbal:16408} informed consent obtained.  Speculum placed in vagina and excellent visualization of cervix achieved, cervix swabbed x 3 with acetic acid solution.  Findings: Cervix: {:728}; {:729}. Vaginal inspection: {:730}. Vulvar colposcopy: {Exam;  colposcopy vulvar:731}.  Specimens: ***  Complications: {complications:13423}.  Plan: {:734}

## 2024-09-05 ENCOUNTER — Telehealth (INDEPENDENT_AMBULATORY_CARE_PROVIDER_SITE_OTHER): Payer: MEDICAID | Admitting: Psychiatry

## 2024-09-05 ENCOUNTER — Encounter (HOSPITAL_COMMUNITY): Payer: Self-pay | Admitting: Psychiatry

## 2024-09-05 ENCOUNTER — Encounter: Payer: Self-pay | Admitting: Obstetrics & Gynecology

## 2024-09-05 ENCOUNTER — Ambulatory Visit: Payer: Self-pay | Admitting: Obstetrics & Gynecology

## 2024-09-05 DIAGNOSIS — F314 Bipolar disorder, current episode depressed, severe, without psychotic features: Secondary | ICD-10-CM

## 2024-09-05 DIAGNOSIS — F909 Attention-deficit hyperactivity disorder, unspecified type: Secondary | ICD-10-CM | POA: Diagnosis not present

## 2024-09-05 DIAGNOSIS — F3162 Bipolar disorder, current episode mixed, moderate: Secondary | ICD-10-CM

## 2024-09-05 DIAGNOSIS — G4700 Insomnia, unspecified: Secondary | ICD-10-CM

## 2024-09-05 DIAGNOSIS — F411 Generalized anxiety disorder: Secondary | ICD-10-CM | POA: Diagnosis not present

## 2024-09-05 DIAGNOSIS — N870 Mild cervical dysplasia: Secondary | ICD-10-CM | POA: Insufficient documentation

## 2024-09-05 LAB — SURGICAL PATHOLOGY

## 2024-09-05 MED ORDER — GABAPENTIN 100 MG PO CAPS: 100.0000 mg | ORAL_CAPSULE | Freq: Three times a day (TID) | ORAL | 0 refills | Status: DC

## 2024-09-05 MED ORDER — LURASIDONE HCL 120 MG PO TABS: 120.0000 mg | ORAL_TABLET | Freq: Every evening | ORAL | 0 refills | Status: DC

## 2024-09-05 MED ORDER — BUPROPION HCL ER (SR) 100 MG PO TB12: 100.0000 mg | ORAL_TABLET | Freq: Two times a day (BID) | ORAL | 1 refills | Status: DC

## 2024-09-05 NOTE — Progress Notes (Signed)
 [facility] Follow up visit  Patient Identification: Wanda Torres MRN:  [id number] Date of Evaluation:  09/05/2024 Referral Source: [facility] . Dr. Lynnette Chief Complaint:   No chief complaint on file. Follow up depression  Visit Diagnosis:    ICD-10-CM   1. Bipolar disorder, current episode depressed, severe, without psychotic features (HCC)  F31.4 buPROPion  ER (WELLBUTRIN  SR) 100 MG 12 hr tablet    2. Insomnia, unspecified type  G47.00     3. Generalized anxiety disorder  F41.1 gabapentin  (NEURONTIN ) 100 MG capsule    4. Bipolar disorder, current episode mixed, moderate (HCC)  F31.62 Lurasidone  HCl (LATUDA ) 120 MG TABS    Virtual Visit via Video Note  I connected with Wanda Torres on 09/05/24 at 10:30 AM EDT by a video enabled telemedicine application and verified that I am speaking with the correct person using two identifiers.  Location: Patient: home Provider: home office   I discussed the limitations of evaluation and management by telemedicine and the availability of in person appointments. The patient expressed understanding and agreed to proceed.      I discussed the assessment and treatment plan with the patient. The patient was provided an opportunity to ask questions and all were answered. The patient agreed with the plan and demonstrated an understanding of the instructions.   The patient was advised to call back or seek an in-person evaluation if the symptoms worsen or if the condition fails to improve as anticipated.  I provided 16 minutes of non-face-to-face time during this encounter.   History of Present Illness: Patient is a 33 years old white female currently living with her fianc initially referred by [facility] as she is a resident of [facility] in [place] so transferred from [facility] patient.  Diagnosed with bipolar, ADD, anxiety.  Patient currently living with her fianc she has 3 kids were not living with her and 2 of the kids are with her ex  and 1 kid is with her sister and was going thru custody concerns   On evaluation doing reasonable she describes to be doing better.  Not having any chest pain or palpitations she is on Adderall for ADHD  Adding gabapentin  has helped her anxiety and mood symptoms at times she has to take an extra 1 for anxiety   Aggravating factors; custody concerns related to kids Modifying factors; her kids, fianc, animals  Duration more than 10 years Severity overall improved   Past Psychiatric History: Bipolar, anxiety, depression  Previous Psychotropic Medications: Yes  Wellbutrin , cymbalta , zoloft  . Says didn't work  Substance Abuse History in the last 12 months:  Yes.    Consequences of Substance Abuse: Has been drinking till last 2 weeks, discussed abstinence and alcohol effect to depression, judjement  Past Medical History:  Past Medical History:  Diagnosis Date   Acne    ADD (attention deficit disorder)    Alcohol abuse    Allergy    Anemia    Anxiety    ASCUS with positive high risk human papillomavirus of vagina 05/2022   Bipolar 1 disorder (HCC)    Depression    Depression    Drug use    Hypothyroidism    Joint pain    PCOS (polycystic ovarian syndrome)    Psoriasis    Swelling of lower extremity    Vitamin B 12 deficiency    Vitamin D  deficiency     Past Surgical History:  Procedure Laterality Date   COLPOSCOPY  07/2022  ENDOCERVICAL MUCOSA WITH CHRONIC CERVICITIS.   NO SQUAMOUS MUCOSA IDENTIFIED.   NO EVIDENCE OF DYSPLASIA OR MALIGNANCY.    Family Psychiatric History: Aunt : bipolar, Mom; depression  Family History:  Family History  Problem Relation Age of Onset   Anxiety disorder Mother    Depression Mother    Sexual abuse Mother    Hypothyroidism Mother    High blood pressure Mother    High Cholesterol Mother    Drug abuse Father    ADD / ADHD Sister    Anxiety disorder Sister    Bipolar disorder Sister    Depression Sister    Drug abuse Sister     Sexual abuse Sister    Anxiety disorder Brother    Depression Brother    Sexual abuse Brother    Colon cancer Maternal Grandmother        Thyroid   Thyroid disease Maternal Grandmother        THyroid cancer   Depression Maternal Grandmother    Sexual abuse Maternal Grandmother    Alcohol abuse Maternal Grandfather    Dementia Maternal Grandfather    Depression Maternal Grandfather    Sexual abuse Maternal Grandfather    Alcohol abuse Maternal Aunt    Bipolar disorder Maternal Aunt    Depression Maternal Aunt    Sexual abuse Maternal Aunt    Bipolar disorder Maternal Aunt    Depression Maternal Aunt    Sexual abuse Maternal Aunt    ADD / ADHD Maternal Uncle    Alcohol abuse Maternal Uncle    Drug abuse Maternal Uncle    Sexual abuse Maternal Uncle    Breast cancer Neg Hx    Ovarian cancer Neg Hx    Endometrial cancer Neg Hx     Social History:   Social History   Socioeconomic History   Marital status: Divorced    Spouse name: Not on file   Number of children: 4   Years of education: Not on file   Highest education level: Some college, no degree  Occupational History    Comment: Supervisor reservations; 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[employer]  Tobacco Use   Smoking status: Never   Smokeless tobacco: Never   Tobacco comments:    Vaps daily   Vaping Use   Vaping status: Every Day   Substances: Nicotine   Substance and Sexual Activity   Alcohol use: Not Currently    Alcohol/week: 1.0 standard drink of alcohol    Types: 1 Standard drinks or equivalent per week    Comment: reports one a week   Drug use: Never   Sexual activity: Yes    Comment: Partner-vasectomy  Other Topics Concern   Not on file  Social History Narrative   Not on file   Social Drivers of Health   Financial Resource Strain: Low Risk  (04/04/2024)   Overall Financial Resource Strain (CARDIA)    Difficulty of Paying Living Expenses: Not very hard  Food Insecurity: No Food Insecurity (04/04/2024)   Hunger  Vital Sign    Worried About Running Out of Food in the Last Year: Never true    Ran Out of Food in the Last Year: Never true  Transportation Needs: No Transportation Needs (04/04/2024)   PRAPARE - Administrator, Civil Service (Medical): No    Lack of Transportation (Non-Medical): No  Physical Activity: Insufficiently Active (04/04/2024)   Exercise Vital Sign    Days of Exercise per Week: 3 days    Minutes of  Exercise per Session: 30 min  Stress: No Stress Concern Present (04/04/2024)   [employer] of Occupational Health - Occupational Stress Questionnaire    Feeling of Stress : Not at all  Social Connections: Moderately Isolated (04/04/2024)   Social Connection and Isolation Panel    Frequency of Communication with Friends and Family: More than three times a week    Frequency of Social Gatherings with Friends and Family: Once a week    Attends Religious Services: Never    Database administrator or Organizations: No    Attends Engineer, structural: Not on file    Marital Status: Living with partner     Allergies:  No Known Allergies  Metabolic Disorder Labs: Lab Results  Component Value Date   HGBA1C 4.8 03/10/2023   MPG 79.58 07/09/2020   No results found for: PROLACTIN Lab Results  Component Value Date   CHOL 152 05/23/2024   TRIG 137 05/23/2024   HDL 42 05/23/2024   CHOLHDL 2.8 09/19/2023   VLDL 29 07/09/2020   LDLCALC 86 05/23/2024   LDLCALC 89 09/19/2023   Lab Results  Component Value Date   TSH 2.100 04/05/2024    Therapeutic Level Labs: No results found for: LITHIUM No results found for: CBMZ No results found for: VALPROATE  Current Medications: Current Outpatient Medications  Medication Sig Dispense Refill   amphetamine -dextroamphetamine  (ADDERALL XR) 20 MG 24 hr capsule Take 1 capsule (20 mg total) by mouth daily. 30 capsule 0   baclofen  (LIORESAL ) 10 MG tablet Take 1 tablet (10 mg total) by mouth 3 (three) times daily. 30  each 1   buPROPion  ER (WELLBUTRIN  SR) 100 MG 12 hr tablet Take 1 tablet (100 mg total) by mouth 2 (two) times daily. 60 tablet 1   Cetirizine HCl (ZYRTEC ALLERGY PO) Take by mouth.     clotrimazole -betamethasone  (LOTRISONE ) cream Apply 2x a day for a week then 1 time a day for a week. 30 g 1   Esomeprazole Magnesium  (NEXIUM PO) Take by mouth.     gabapentin  (NEURONTIN ) 100 MG capsule Take 1 capsule (100 mg total) by mouth 3 (three) times daily. 270 capsule 0   levothyroxine  (SYNTHROID ) 25 MCG tablet Take 1 tablet (25 mcg total) by mouth daily. 90 tablet 3   Lurasidone  HCl (LATUDA ) 120 MG TABS Take 1 tablet (120 mg total) by mouth every evening. Take with food 90 tablet 0   meloxicam  (MOBIC ) 15 MG tablet Take 1 tablet (15 mg total) by mouth daily. Take with food 30 tablet 0   metroNIDAZOLE  (FLAGYL ) 500 MG tablet Take 1 tablet (500 mg total) by mouth 2 (two) times daily. (Patient not taking: Reported on 09/03/2024) 14 tablet 0   nystatin  (MYCOSTATIN /NYSTOP ) powder Apply 2x a days as needed 15 g 0   Semaglutide -Weight Management (WEGOVY ) 1.7 MG/0.75ML SOAJ Inject 1.7 mg into the skin once a week. 3 mL 1   No current facility-administered medications for this visit.     Psychiatric Specialty Exam: Review of Systems  Cardiovascular:  Negative for chest pain.  Neurological:  Negative for tremors.    Last menstrual period 08/28/2024.There is no height or weight on file to calculate BMI.  General Appearance: Casual  Eye Contact:  Fair  Speech:  Slow  Volume:  Decreased  Mood: better  Affect:  Congruent  Thought Process:  Goal Directed  Orientation:  Full (Time, Place, and Person)  Thought Content:  Rumination  Suicidal Thoughts:  No  Homicidal Thoughts:  No  Memory:  Immediate;   Fair  Judgement:  Fair  Insight:  Shallow  Psychomotor Activity:  Decreased  Concentration:  Concentration: Fair  Recall:  [employer] of Knowledge:Fair  Language: Fair  Akathisia:  No  Handed:    AIMS (if  indicated):  no involuntary movements   Assets:  Desire for Improvement  ADL's:  Intact  Cognition: WNL  Sleep:  Fair   Screenings: AIMS    Flowsheet Row [facility] (Dis[facility]  AIMS Total Score 0   AUDIT    Flowsheet Row Office Visit from 04/05/2024 in [facility] from 03/24/2023 in [facility] Ad[facility] (Discharged[facility] Admission (Discharged) from [facility]  Alcohol Use Disorder Identification Test Final Score (AUDIT) 5  5 2 3    GAD-7    Flowsheet Row Office Visit from 08/30/2024 in [facility] Visit from 07/16/2024 in [facility] at [employer] Office Visit from 03/10/2023 in [facility] at [facility] Video Visit from 09/14/2022 in [facility] Video Visit from 08/04/2022 in [facility]  Total GAD-7 Score 0 0 6 5 4    PHQ2-9    Flowsheet Row Office Visit from 08/30/2024 in [facility] at [facility] Visit from 07/16/2024 in [facility] [employer] Office Visit from 04/05/2024 in [facility] at [facility] Visit from 11/04/2023 in [facility] Counselor from 10/13/2023 in [facility] at [facility]  PHQ-2 Total Score 0 1 0 3 6  PHQ-9 Total Score 0 4 4 14 22    Flowsheet Row UC from 05/23/2024 in [facility] at [facility] Video Visit from 01/26/2024 in [facility] Out[facility] at [facility] Office Visit from 11/04/2023 in [facility] at  [facility]  C-SSRS RISK CATEGORY No Risk No Risk Error: Q3, 4, or 5 should not be populated when Q2 is No    Assessment and Plan: as follows  Prior documentation reviewed    Bipolar disorder current episode depressed : Improved continue Latuda  discussed to get EKG by her primary care physician labs being monitored by PCP as well Continue sleep hygiene  Generalized anxiety; improving continue gabapentin  3-4 times a day ADHD; manageable with Adderall discussed and reviewed side effects no headaches or palpitations as of now Insomnia; improving continue sleep hygiene   Fu 3 m. Renewed meds which were due   Collaboration of Care: Psychiatrist [facility] providers chart reviewed   Patient/Guardian was advised Release of Information must be obtained prior to any record release in order to collaborate their care with an outside provider. Patient/Guardian was advised if they have not already done so to contact the registration department to sign all necessary forms in order for us  to release information regarding their care.   Consent: Patient/Guardian gives verbal consent for treatment and assignment of benefits for services provided during this visit. Patient/Guardian expressed understanding and agreed to proceed.   Jackey Flight, MD 9/10/202510:36 AM

## 2024-09-08 LAB — IRON AND TIBC
Iron Saturation: 50 % (ref 15–55)
Iron: 133 ug/dL (ref 27–159)
Total Iron Binding Capacity: 267 ug/dL (ref 250–450)
UIBC: 134 ug/dL (ref 131–425)

## 2024-09-08 LAB — B12 AND FOLATE PANEL
Folate: 14.5 ng/mL (ref >3.0)
Vitamin B-12: 986 pg/mL (ref 232–1245)

## 2024-09-08 LAB — VITAMIN B1: Thiamine: 150.5 nmol/L (ref 66.5–200.0)

## 2024-09-10 ENCOUNTER — Ambulatory Visit: Payer: Self-pay | Admitting: Family Medicine

## 2024-09-10 ENCOUNTER — Telehealth: Payer: Self-pay

## 2024-09-10 DIAGNOSIS — K130 Diseases of lips: Secondary | ICD-10-CM

## 2024-09-10 NOTE — Telephone Encounter (Signed)
 Patient spoke with PCP and aware of lab results   Copied from CRM [id number]. Topic: Clinical - Lab/Test Results >> Sep 07, 2024  3:07 PM Tinnie C wrote: Reason for CRM: Pt did blood test on 9/8 but has not received results back yet. Lab orders still showing active on her chart, not showing collected. Pt confirms she did give blood at that visit. Please advise if there are problems with her lab order. Patient's CB# [phone]

## 2024-09-12 ENCOUNTER — Ambulatory Visit: Payer: MEDICAID | Admitting: Nurse Practitioner

## 2024-09-12 ENCOUNTER — Encounter: Payer: Self-pay | Admitting: Nurse Practitioner

## 2024-09-12 VITALS — BP 112/78 | HR 92 | Temp 98.3°F | Ht 72.0 in | Wt 251.0 lb

## 2024-09-12 DIAGNOSIS — R632 Polyphagia: Secondary | ICD-10-CM | POA: Diagnosis not present

## 2024-09-12 DIAGNOSIS — Z6834 Body mass index (BMI) 34.0-34.9, adult: Secondary | ICD-10-CM

## 2024-09-12 DIAGNOSIS — E66811 Obesity, class 1: Secondary | ICD-10-CM | POA: Diagnosis not present

## 2024-09-12 MED ORDER — WEGOVY 1.7 MG/0.75ML ~~LOC~~ SOAJ: 1.7000 mg | SUBCUTANEOUS | 1 refills | Status: DC

## 2024-09-12 NOTE — Progress Notes (Signed)
 Office: [phone]  /  Fax: [phone]  WEIGHT SUMMARY AND BIOMETRICS  Weight Lost Since Last Visit: 11lb  Weight Gained Since Last Visit: 0lb   Vitals Temp: 98.3 F (36.8 C) BP: 112/78 Pulse Rate: 92 SpO2: 96 %   Anthropometric Measurements Height: 6' (1.829 m) Weight: 251 lb (113.9 kg) BMI (Calculated): 34.03 Weight at Last Visit: 262lb Weight Lost Since Last Visit: 11lb Weight Gained Since Last Visit: 0lb Starting Weight: 288lb Total Weight Loss (lbs): 37 lb (16.8 kg)   Body Composition  Body Fat %: 43.9 % Fat Mass (lbs): 110.2 lbs Muscle Mass (lbs): 133.8 lbs Total Body Water (lbs): 89.8 lbs Visceral Fat Rating : 9   Other Clinical Data Fasting: No Labs: No Today's Visit #: 15 Starting Date: 04/06/23     HPI  Chief Complaint: OBESITY  Wanda Torres is here to discuss her progress with her obesity treatment plan. She is on the the Category 4 Plan and states she is following her eating plan approximately 70 % of the time. She states she is exercising 30-45 minutes 6 days per week.   Interval History:  Since last office visit she has lost 11 pounds. She is drinking water daily.   She is exercising 6 days per week.  Three days she is doing 30 mins cardio, two days she is doing barre class and on the weekends she is doing resistance training.    Her highest weight was 320 lbs.     Pharmacotherapy for weight loss: She is currently taking Wegovy  1.7mg  for medical weight loss.  Denies side effects.  Has helped with polyphagia and cravings.    Previous pharmacotherapy for medical weight loss:  none  Bariatric surgery:  Patient has not had bariatric surgery.    PHYSICAL EXAM:  Blood pressure 112/78, pulse 92, temperature 98.3 F (36.8 C), height 6' (1.829 m), weight 251 lb (113.9 kg), last menstrual period 08/28/2024, SpO2 96%. Body mass index is 34.04 kg/m.  General: She is overweight, cooperative, alert, well developed, and in no acute  distress. PSYCH: Has normal mood, affect and thought process.   Extremities: No edema.  Neurologic: No gross sensory or motor deficits. No tremors or fasciculations noted.    DIAGNOSTIC DATA REVIEWED:  BMET    Component Value Date/Time   NA 140 05/23/2024 1212   K 4.4 05/23/2024 1212   CL 102 05/23/2024 1212   CO2 22 05/23/2024 1212   GLUCOSE 84 05/23/2024 1212   GLUCOSE 63 (L) 09/15/2020 2229   BUN 6 05/23/2024 1212   CREATININE 0.89 05/23/2024 1212   CALCIUM 9.8 05/23/2024 1212   GFRNONAA >60 09/15/2020 2229   GFRAA >60 09/15/2020 2229   Lab Results  Component Value Date   HGBA1C 4.8 03/10/2023   HGBA1C 4.4 (L) 07/09/2020   Lab Results  Component Value Date   INSULIN  6.0 04/06/2023   Lab Results  Component Value Date   TSH 2.100 04/05/2024   CBC    Component Value Date/Time   WBC 5.3 03/10/2023 1438   WBC 7.2 09/15/2020 2229   RBC 4.49 03/10/2023 1438   RBC 4.78 09/15/2020 2229   HGB 15.0 03/10/2023 1438   HCT 43.9 03/10/2023 1438   PLT 254 03/10/2023 1438   MCV 98 (H) 03/10/2023 1438   MCH 33.4 (H) 03/10/2023 1438   MCH 33.7 09/15/2020 2229   MCHC 34.2 03/10/2023 1438   MCHC 35.8 09/15/2020 2229   RDW 13.8 03/10/2023 1438   Iron Studies  Component Value Date/Time   IRON 133 08/30/2024 1131   TIBC 267 08/30/2024 1131   IRONPCTSAT 50 08/30/2024 1131   Lipid Panel     Component Value Date/Time   CHOL 152 05/23/2024 1212   TRIG 137 05/23/2024 1212   HDL 42 05/23/2024 1212   CHOLHDL 2.8 09/19/2023 1017   CHOLHDL 2.2 07/09/2020 1745   VLDL 29 07/09/2020 1745   LDLCALC 86 05/23/2024 1212   Hepatic Function Panel     Component Value Date/Time   PROT 7.0 05/23/2024 1212   ALBUMIN 4.5 05/23/2024 1212   AST 24 05/23/2024 1212   ALT 38 (H) 05/23/2024 1212   ALKPHOS 66 05/23/2024 1212   BILITOT 0.6 05/23/2024 1212   BILIDIR 0.1 07/09/2020 1745   IBILI 0.6 07/09/2020 1745      Component Value Date/Time   TSH 2.100 04/05/2024 1349    Nutritional Lab Results  Component Value Date   VD25OH 79.2 05/23/2024   VD25OH 41.3 03/10/2023   VD25OH 37.1 05/01/2018     ASSESSMENT AND PLAN  TREATMENT PLAN FOR OBESITY:  Recommended Dietary Goals  Wanda Torres is currently in the action stage of change. As such, her goal is to continue weight management plan. She has agreed to keeping a food journal and adhering to recommended goals of 1800-2000 calories and 80+ grams of protein.  Behavioral Intervention  We discussed the following Behavioral Modification Strategies today: increasing lean protein intake to established goals, decreasing simple carbohydrates , increasing vegetables, increasing fiber rich foods, increasing water intake , work on tracking and journaling calories using tracking application, reading food labels , keeping healthy foods at home, continue to practice mindfulness when eating, planning for success, continue to work on maintaining a reduced calorie state, getting the recommended amount of protein, incorporating whole foods, making healthy choices, staying well hydrated and practicing mindfulness when eating., and increase protein intake, fibrous foods (25 grams per day for women, 30 grams for men) and water to improve satiety and decrease hunger signals. .  Additional resources provided today: NA  Recommended Physical Activity Goals  Wanda Torres has been advised to work up to 150 minutes of moderate intensity aerobic activity a week and strengthening exercises 2-3 times per week for cardiovascular health, weight loss maintenance and preservation of muscle mass.   She has agreed to Think about enjoyable ways to increase daily physical activity and overcoming barriers to exercise, Increase physical activity in their day and reduce sedentary time (increase NEAT)., Start strengthening exercises with a goal of 2-3 sessions a week , Continue to gradually increase the amount and intensity of exercise routine, Increase  volume of physical activity to a goal of 240 minutes a week, and Combine aerobic and strengthening exercises for efficiency and improved cardiometabolic health.   Pharmacotherapy We discussed various medication options to help Wanda Torres with her weight loss efforts and we both agreed to continue Wegovy  1.7mg .  Side effects discussed.  ASSOCIATED CONDITIONS ADDRESSED TODAY  Action/Plan  Polyphagia -     Wegovy ; Inject 1.7 mg into the skin once a week.  Dispense: 9 mL; Refill: 1  Obesity, Class I, BMI 30-34.9 -     Wegovy ; Inject 1.7 mg into the skin once a week.  Dispense: 9 mL; Refill: 1     Options discussed today: Medicaid is no longer going to cover Wegovy  after Oct 1st To discuss with Dr. Geralene: Qsymia-would need to stop adderall prior to taking 2.  Contrave would need to titrate off Wellbutrin  SR  3.  Naltrexone add to current meds  Brochures given on Contrave and Qsymia.  Will make appt with Dr. Geralene to discuss best option prior to next visit.   Will recheck labs at next visit  Return in about 4 weeks (around 10/10/2024).Wanda Torres She was informed of the importance of frequent follow up visits to maximize her success with intensive lifestyle modifications for her multiple health conditions.   ATTESTASTION STATEMENTS:  Reviewed by clinician on day of visit: allergies, medications, problem list, medical history, surgical history, family history, social history, and previous encounter notes.     Wanda Torres. Wanda Haugan FNP-C

## 2024-09-16 ENCOUNTER — Encounter: Payer: Self-pay | Admitting: Nurse Practitioner

## 2024-09-26 ENCOUNTER — Encounter (HOSPITAL_COMMUNITY): Payer: Self-pay

## 2024-09-28 ENCOUNTER — Telehealth (HOSPITAL_COMMUNITY): Payer: Self-pay | Admitting: Psychiatry

## 2024-09-28 MED ORDER — AMPHETAMINE-DEXTROAMPHET ER 20 MG PO CP24: 20.0000 mg | ORAL_CAPSULE | Freq: Every day | ORAL | 0 refills | Status: DC

## 2024-09-28 NOTE — Telephone Encounter (Signed)
 Patient left voicemail requesting refill of  amphetamine -dextroamphetamine  (ADDERALL XR) 20 MG 24 hr capsule .    [id number] - [place], [place] - [address] (Ph: [phone])   Last ordered: 08/29/2024 - 30 capsules Last visit: 09/05/2024 Next visit: 12/05/2024

## 2024-10-17 ENCOUNTER — Ambulatory Visit: Payer: MEDICAID | Admitting: Nurse Practitioner

## 2024-10-17 ENCOUNTER — Encounter: Payer: Self-pay | Admitting: Nurse Practitioner

## 2024-10-17 VITALS — BP 119/79 | HR 81 | Temp 97.6°F | Ht 72.0 in | Wt 252.0 lb

## 2024-10-17 DIAGNOSIS — R632 Polyphagia: Secondary | ICD-10-CM | POA: Diagnosis not present

## 2024-10-17 DIAGNOSIS — Z6834 Body mass index (BMI) 34.0-34.9, adult: Secondary | ICD-10-CM | POA: Diagnosis not present

## 2024-10-17 DIAGNOSIS — E66811 Obesity, class 1: Secondary | ICD-10-CM

## 2024-10-17 NOTE — Progress Notes (Signed)
 Office: [phone]  /  Fax: [phone]  WEIGHT SUMMARY AND BIOMETRICS  Weight Lost Since Last Visit: 0  Weight Gained Since Last Visit: 1lb   Vitals Temp: 97.6 F (36.4 C) BP: 119/79 Pulse Rate: 81 SpO2: 95 %   Anthropometric Measurements Height: 6' (1.829 m) Weight: 252 lb (114.3 kg) BMI (Calculated): 34.17 Weight at Last Visit: 251lb Weight Lost Since Last Visit: 0 Weight Gained Since Last Visit: 1lb Starting Weight: 288lb Total Weight Loss (lbs): 36 lb (16.3 kg)   Body Composition  Body Fat %: 43.7 % Fat Mass (lbs): 110.2 lbs Muscle Mass (lbs): 134.6 lbs Total Body Water (lbs): 92.4 lbs Visceral Fat Rating : 9   Other Clinical Data Fasting: no Labs: no Today's Visit #: 16 Starting Date: 04/06/23     HPI  Chief Complaint: OBESITY  Wanda Torres is here to discuss her progress with her obesity treatment plan. She is on the the Category 4 Plan and states she is following her eating plan approximately 50 % of the time. She states she is exercising 45 minutes 2 days per week.   Interval History:  Since last office visit she has gained 1 pound.  She has been to multiple celebrates this month and contributes that to her weight gain. No upcoming celebrations.  She also hasn't been working out over the past month as she was in the past.    Her highest weight was 320 lbs.       Pharmacotherapy for weight loss: She is currently taking Wegovy  1.7mg  for medical weight loss.  Reports side effects of dry mouth/dry lips.  Has helped with polyphagia and cravings.     Previous pharmacotherapy for medical weight loss:  none   Bariatric surgery:  Patient has not had bariatric surgery.     PHYSICAL EXAM:  Blood pressure 119/79, pulse 81, temperature 97.6 F (36.4 C), height 6' (1.829 m), weight 252 lb (114.3 kg), SpO2 95%. Body mass index is 34.18 kg/m.  General: She is overweight, cooperative, alert, well developed, and in no acute distress. PSYCH: Has  normal mood, affect and thought process.   Extremities: No edema.  Neurologic: No gross sensory or motor deficits. No tremors or fasciculations noted.    DIAGNOSTIC DATA REVIEWED:  BMET    Component Value Date/Time   NA 140 05/23/2024 1212   K 4.4 05/23/2024 1212   CL 102 05/23/2024 1212   CO2 22 05/23/2024 1212   GLUCOSE 84 05/23/2024 1212   GLUCOSE 63 (L) 09/15/2020 2229   BUN 6 05/23/2024 1212   CREATININE 0.89 05/23/2024 1212   CALCIUM 9.8 05/23/2024 1212   GFRNONAA >60 09/15/2020 2229   GFRAA >60 09/15/2020 2229   Lab Results  Component Value Date   HGBA1C 4.8 03/10/2023   HGBA1C 4.4 (L) 07/09/2020   Lab Results  Component Value Date   INSULIN  6.0 04/06/2023   Lab Results  Component Value Date   TSH 2.100 04/05/2024   CBC    Component Value Date/Time   WBC 5.3 03/10/2023 1438   WBC 7.2 09/15/2020 2229   RBC 4.49 03/10/2023 1438   RBC 4.78 09/15/2020 2229   HGB 15.0 03/10/2023 1438   HCT 43.9 03/10/2023 1438   PLT 254 03/10/2023 1438   MCV 98 (H) 03/10/2023 1438   MCH 33.4 (H) 03/10/2023 1438   MCH 33.7 09/15/2020 2229   MCHC 34.2 03/10/2023 1438   MCHC 35.8 09/15/2020 2229   RDW 13.8 03/10/2023 1438   Iron Studies  Component Value Date/Time   IRON 133 08/30/2024 1131   TIBC 267 08/30/2024 1131   IRONPCTSAT 50 08/30/2024 1131   Lipid Panel     Component Value Date/Time   CHOL 152 05/23/2024 1212   TRIG 137 05/23/2024 1212   HDL 42 05/23/2024 1212   CHOLHDL 2.8 09/19/2023 1017   CHOLHDL 2.2 07/09/2020 1745   VLDL 29 07/09/2020 1745   LDLCALC 86 05/23/2024 1212   Hepatic Function Panel     Component Value Date/Time   PROT 7.0 05/23/2024 1212   ALBUMIN 4.5 05/23/2024 1212   AST 24 05/23/2024 1212   ALT 38 (H) 05/23/2024 1212   ALKPHOS 66 05/23/2024 1212   BILITOT 0.6 05/23/2024 1212   BILIDIR 0.1 07/09/2020 1745   IBILI 0.6 07/09/2020 1745      Component Value Date/Time   TSH 2.100 04/05/2024 1349   Nutritional Lab Results   Component Value Date   VD25OH 79.2 05/23/2024   VD25OH 41.3 03/10/2023   VD25OH 37.1 05/01/2018     ASSESSMENT AND PLAN  TREATMENT PLAN FOR OBESITY:  Recommended Dietary Goals  Sebastian is currently in the action stage of change. As such, her goal is to continue weight management plan. She has agreed to the Category 4 Plan.  Behavioral Intervention  We discussed the following Behavioral Modification Strategies today: increasing lean protein intake to established goals, decreasing simple carbohydrates , increasing vegetables, increasing fiber rich foods, increasing water intake , work on meal planning and preparation, work on tracking and journaling calories using tracking application, reading food labels , keeping healthy foods at home, planning for success, continue to work on maintaining a reduced calorie state, getting the recommended amount of protein, incorporating whole foods, making healthy choices, staying well hydrated and practicing mindfulness when eating., and increase protein intake, fibrous foods (25 grams per day for women, 30 grams for men) and water to improve satiety and decrease hunger signals. .  Additional resources provided today: NA  Recommended Physical Activity Goals  Arlin has been advised to work up to 150 minutes of moderate intensity aerobic activity a week and strengthening exercises 2-3 times per week for cardiovascular health, weight loss maintenance and preservation of muscle mass.   She has agreed to Think about enjoyable ways to increase daily physical activity and overcoming barriers to exercise, Increase physical activity in their day and reduce sedentary time (increase NEAT)., Start strengthening exercises with a goal of 2-3 sessions a week , Continue to gradually increase the amount and intensity of exercise routine, Increase volume of physical activity to a goal of 240 minutes a week, and Combine aerobic and strengthening exercises for  efficiency and improved cardiometabolic health.   Pharmacotherapy We discussed various medication options to help [facility] with her weight loss efforts and we both agreed to continue Wegovy  1.7mg .  side effects discussed.  ASSOCIATED CONDITIONS ADDRESSED TODAY  Action/Plan  Polyphagia Continue Wegovy  1.7mg . side effects discussed.  Patient notes some dry mouth and dry lips.  She is on several medications that contributes to dry mouth/dry lips.  She doesn't want to stop any of her medications at this time.  States she is doing well mentally and emotionally.  She has an appt scheduled with derm and with her psychiatrist.  Discussed measures to help with dry mouth.  To make an appt with her dentist for follow up.  If worsens or persist to let me know.  She has 7 weeks of Wegovy  left and would like to finish  taking the Wegovy .    Obesity, Class I, BMI 30-34.9 She will not be able to continue Wegovy  due to cost.  She is doing well with her current medications mentally and emotionally and doesn't want to make any changes.  She is limited on treatment options due to cost and her current medications.  She has an appt with her psychiatrist prior to her next visit with me.  Qsymia is not an option since she is taking Adderall.  She doesn't want to stop her current dose of Wellbutrin  so Contrave would not be an options.  She can't continue Wegovy  due to cost.  She would like to start naltrexone off label for weight loss after stopping Wegovy . Will discuss with her psychiatrist and we can discuss at her next visit     Return in about 6 weeks (around 11/28/2024).SABRA She was informed of the importance of frequent follow up visits to maximize her success with intensive lifestyle modifications for her multiple health conditions.   ATTESTASTION STATEMENTS:  Reviewed by clinician on day of visit: allergies, medications, problem list, medical history, surgical history, family history, social history, and previous  encounter notes.     Corean SAUNDERS. Wael Maestas FNP-C

## 2024-10-24 ENCOUNTER — Telehealth (HOSPITAL_COMMUNITY): Payer: Self-pay | Admitting: *Deleted

## 2024-10-24 MED ORDER — AMPHETAMINE-DEXTROAMPHET ER 20 MG PO CP24: 20.0000 mg | ORAL_CAPSULE | Freq: Every day | ORAL | 0 refills | Status: DC

## 2024-10-24 NOTE — Telephone Encounter (Signed)
 Patient called requesting refill for her Adderall XR. Patient f/u appt is 12-05-24.

## 2024-10-31 ENCOUNTER — Other Ambulatory Visit (HOSPITAL_COMMUNITY): Payer: Self-pay | Admitting: *Deleted

## 2024-10-31 DIAGNOSIS — F314 Bipolar disorder, current episode depressed, severe, without psychotic features: Secondary | ICD-10-CM

## 2024-10-31 MED ORDER — BUPROPION HCL ER (SR) 100 MG PO TB12: 100.0000 mg | ORAL_TABLET | Freq: Two times a day (BID) | ORAL | 0 refills | Status: DC

## 2024-11-05 ENCOUNTER — Encounter (HOSPITAL_COMMUNITY): Payer: Self-pay | Admitting: Psychiatry

## 2024-11-05 ENCOUNTER — Telehealth (INDEPENDENT_AMBULATORY_CARE_PROVIDER_SITE_OTHER): Payer: MEDICAID | Admitting: Psychiatry

## 2024-11-05 DIAGNOSIS — G4700 Insomnia, unspecified: Secondary | ICD-10-CM

## 2024-11-05 DIAGNOSIS — F410 Panic disorder [episodic paroxysmal anxiety] without agoraphobia: Secondary | ICD-10-CM | POA: Diagnosis not present

## 2024-11-05 DIAGNOSIS — F314 Bipolar disorder, current episode depressed, severe, without psychotic features: Secondary | ICD-10-CM | POA: Diagnosis not present

## 2024-11-05 DIAGNOSIS — F411 Generalized anxiety disorder: Secondary | ICD-10-CM | POA: Diagnosis not present

## 2024-11-05 DIAGNOSIS — F900 Attention-deficit hyperactivity disorder, predominantly inattentive type: Secondary | ICD-10-CM

## 2024-11-05 MED ORDER — PROPRANOLOL HCL 10 MG PO TABS: 10.0000 mg | ORAL_TABLET | Freq: Every day | ORAL | 0 refills | Status: DC | PRN

## 2024-11-05 NOTE — Progress Notes (Signed)
 [facility] Follow up visit  Patient Identification: Wanda Torres MRN:  [id number] Date of Evaluation:  11/05/2024 Referral Source: [facility] . Dr. Lynnette Chief Complaint:   No chief complaint on file. Follow up depression  Visit Diagnosis:    ICD-10-CM   1. Bipolar disorder, current episode depressed, severe, without psychotic features (HCC)  F31.4     2. Insomnia, unspecified type  G47.00     3. Generalized anxiety disorder  F41.1     4. Panic attacks  F41.0     5. Attention deficit hyperactivity disorder (ADHD), predominantly inattentive type  F90.0     Virtual Visit via Video Note  I connected with Wanda Torres on 11/05/24 at  3:30 PM EST by a video enabled telemedicine application and verified that I am speaking with the correct person using two identifiers.  Location: Patient: home Provider: home office   I discussed the limitations of evaluation and management by telemedicine and the availability of in person appointments. The patient expressed understanding and agreed to proceed.      I discussed the assessment and treatment plan with the patient. The patient was provided an opportunity to ask questions and all were answered. The patient agreed with the plan and demonstrated an understanding of the instructions.   The patient was advised to call back or seek an in-person evaluation if the symptoms worsen or if the condition fails to improve as anticipated.  I provided 20 minutes of non-face-to-face time during this encounter.    History of Present Illness: Patient is a 33 years old white female currently living with her fianc initially referred by [facility] as she is a resident of [facility] in [place] so transferred from [facility] patient.  Diagnosed with bipolar, ADD, anxiety.  Patient currently living with her fianc she has 3 kids were not living with her and 2 of the kids are with her ex and 1 kid is with her sister and was going thru custody concerns    This is an early visit for follow-up on evaluation patient is endorsing anxiety she is feeling stressed out because of the holidays she is not sure exactly what is causing the anxiety she does have to take care of the kids when they are at her home during the joint custody She states she has taken extra gabapentin  like 4 times a day instead of 3 times a day to combat anxiety but still endorses anxiety and at times panic-like attacks.  She wants to get back on propranolol  that has helped anxiety or panic attacks in the past  She continues to take Latuda  that she follows with her primary care physician and regarding her labs workup no report of involuntary movements or noticeable She is fianc is supportive  Aggravating factors; custody concerns related to kids Modifying factors; her kids, fianc, animals  Duration more than 10 years Severity endorsing anxiety   Past Psychiatric History: Bipolar, anxiety, depression  Previous Psychotropic Medications: Yes  Wellbutrin , cymbalta , zoloft  . Says didn't work  Substance Abuse History in the last 12 months:  Yes.    Consequences of Substance Abuse: Has been drinking till last 2 weeks, discussed abstinence and alcohol effect to depression, judjement  Past Medical History:  Past Medical History:  Diagnosis Date   Acne    ADD (attention deficit disorder)    Alcohol abuse    Allergy    Anemia    Anxiety    ASCUS with positive high risk human papillomavirus of  vagina 05/2022   Bipolar 1 disorder (HCC)    Depression    Depression    Drug use    Hypothyroidism    Joint pain    PCOS (polycystic ovarian syndrome)    Psoriasis    Swelling of lower extremity    Vitamin B 12 deficiency    Vitamin D  deficiency     Past Surgical History:  Procedure Laterality Date   COLPOSCOPY  07/2022   ENDOCERVICAL MUCOSA WITH CHRONIC CERVICITIS.   NO SQUAMOUS MUCOSA IDENTIFIED.   NO EVIDENCE OF DYSPLASIA OR MALIGNANCY.    Family Psychiatric  History: Aunt : bipolar, Mom; depression  Family History:  Family History  Problem Relation Age of Onset   Anxiety disorder Mother    Depression Mother    Sexual abuse Mother    Hypothyroidism Mother    High blood pressure Mother    High Cholesterol Mother    Drug abuse Father    ADD / ADHD Sister    Anxiety disorder Sister    Bipolar disorder Sister    Depression Sister    Drug abuse Sister    Sexual abuse Sister    Anxiety disorder Brother    Depression Brother    Sexual abuse Brother    Colon cancer Maternal Grandmother        Thyroid   Thyroid disease Maternal Grandmother        THyroid cancer   Depression Maternal Grandmother    Sexual abuse Maternal Grandmother    Alcohol abuse Maternal Grandfather    Dementia Maternal Grandfather    Depression Maternal Grandfather    Sexual abuse Maternal Grandfather    Alcohol abuse Maternal Aunt    Bipolar disorder Maternal Aunt    Depression Maternal Aunt    Sexual abuse Maternal Aunt    Bipolar disorder Maternal Aunt    Depression Maternal Aunt    Sexual abuse Maternal Aunt    ADD / ADHD Maternal Uncle    Alcohol abuse Maternal Uncle    Drug abuse Maternal Uncle    Sexual abuse Maternal Uncle    Breast cancer Neg Hx    Ovarian cancer Neg Hx    Endometrial cancer Neg Hx     Social History:   Social History   Socioeconomic History   Marital status: Divorced    Spouse name: Not on file   Number of children: 4   Years of education: Not on file   Highest education level: Some college, no degree  Occupational History    Comment: Supervisor reservations; 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[employer]  Tobacco Use   Smoking status: Never   Smokeless tobacco: Never   Tobacco comments:    Vaps daily   Vaping Use   Vaping status: Every Day   Substances: Nicotine   Substance and Sexual Activity   Alcohol use: Not Currently    Alcohol/week: 1.0 standard drink of alcohol    Types: 1 Standard drinks or equivalent per week    Comment:  reports one a week   Drug use: Never   Sexual activity: Yes    Comment: Partner-vasectomy  Other Topics Concern   Not on file  Social History Narrative   Not on file   Social Drivers of Health   Financial Resource Strain: Low Risk  (04/04/2024)   Overall Financial Resource Strain (CARDIA)    Difficulty of Paying Living Expenses: Not very hard  Food Insecurity: No Food Insecurity (04/04/2024)   Hunger Vital Sign  Worried About Programme Researcher, Broadcasting/film/video in the Last Year: Never true    Ran Out of Food in the Last Year: Never true  Transportation Needs: No Transportation Needs (04/04/2024)   PRAPARE - Administrator, Civil Service (Medical): No    Lack of Transportation (Non-Medical): No  Physical Activity: Insufficiently Active (04/04/2024)   Exercise Vital Sign    Days of Exercise per Week: 3 days    Minutes of Exercise per Session: 30 min  Stress: No Stress Concern Present (04/04/2024)   [employer] of Occupational Health - Occupational Stress Questionnaire    Feeling of Stress : Not at all  Social Connections: Moderately Isolated (04/04/2024)   Social Connection and Isolation Panel    Frequency of Communication with Friends and Family: More than three times a week    Frequency of Social Gatherings with Friends and Family: Once a week    Attends Religious Services: Never    Database Administrator or Organizations: No    Attends Engineer, Structural: Not on file    Marital Status: Living with partner     Allergies:  No Known Allergies  Metabolic Disorder Labs: Lab Results  Component Value Date   HGBA1C 4.8 03/10/2023   MPG 79.58 07/09/2020   No results found for: PROLACTIN Lab Results  Component Value Date   CHOL 152 05/23/2024   TRIG 137 05/23/2024   HDL 42 05/23/2024   CHOLHDL 2.8 09/19/2023   VLDL 29 07/09/2020   LDLCALC 86 05/23/2024   LDLCALC 89 09/19/2023   Lab Results  Component Value Date   TSH 2.100 04/05/2024    Therapeutic Level  Labs: No results found for: LITHIUM No results found for: CBMZ No results found for: VALPROATE  Current Medications: Current Outpatient Medications  Medication Sig Dispense Refill   propranolol  (INDERAL ) 10 MG tablet Take 1 tablet (10 mg total) by mouth daily as needed. 30 tablet 0   amphetamine -dextroamphetamine  (ADDERALL XR) 20 MG 24 hr capsule Take 1 capsule (20 mg total) by mouth daily. 30 capsule 0   baclofen  (LIORESAL ) 10 MG tablet Take 1 tablet (10 mg total) by mouth 3 (three) times daily. 30 each 1   buPROPion  ER (WELLBUTRIN  SR) 100 MG 12 hr tablet Take 1 tablet (100 mg total) by mouth 2 (two) times daily. 60 tablet 0   Cetirizine HCl (ZYRTEC ALLERGY PO) Take by mouth.     clotrimazole -betamethasone  (LOTRISONE ) cream Apply 2x a day for a week then 1 time a day for a week. 30 g 1   Esomeprazole Magnesium  (NEXIUM PO) Take by mouth.     gabapentin  (NEURONTIN ) 100 MG capsule Take 1 capsule (100 mg total) by mouth 3 (three) times daily. 270 capsule 0   levothyroxine  (SYNTHROID ) 25 MCG tablet Take 1 tablet (25 mcg total) by mouth daily. 90 tablet 3   Lurasidone  HCl (LATUDA ) 120 MG TABS Take 1 tablet (120 mg total) by mouth every evening. Take with food 90 tablet 0   meloxicam  (MOBIC ) 15 MG tablet Take 1 tablet (15 mg total) by mouth daily. Take with food 30 tablet 0   nystatin  (MYCOSTATIN /NYSTOP ) powder Apply 2x a days as needed 15 g 0   semaglutide -weight management (WEGOVY ) 1.7 MG/0.75ML SOAJ SQ injection Inject 1.7 mg into the skin once a week. 9 mL 1   No current facility-administered medications for this visit.     Psychiatric Specialty Exam: Review of Systems  Cardiovascular:  Negative for chest pain.  Neurological:  Negative for tremors.    There were no vitals taken for this visit.There is no height or weight on file to calculate BMI.  General Appearance: Casual  Eye Contact:  Fair  Speech:  Slow  Volume:  Decreased  Mood: Somewhat stressed  Affect:  Congruent   Thought Process:  Goal Directed  Orientation:  Full (Time, Place, and Person)  Thought Content:  Rumination  Suicidal Thoughts:  No  Homicidal Thoughts:  No  Memory:  Immediate;   Fair  Judgement:  Fair  Insight:  Shallow  Psychomotor Activity:  Decreased  Concentration:  Concentration: Fair  Recall:  [employer] of Knowledge:Fair  Language: Fair  Akathisia:  No  Handed:    AIMS (if indicated):  no involuntary movements   Assets:  Desire for Improvement  ADL's:  Intact  Cognition: WNL  Sleep:  Fair   Screenings: AIMS    Flowsheet Row Ad[facility] (Discharged) [facility]  AIMS Total Score 0   AUDIT    Flowsheet Row Office Visit from 04/05/2024 in [facility] from 03/24/2023 in [facility] Admission (Discharged) [facility] Admission (Discharged) from OP [facility]  Alcohol Use Disorder Identification Test Final Score (AUDIT) 5  5 2 3    GAD-7    Flowsheet Row Office Visit from 08/30/2024 in [facility] at [facility] Visit from 07/16/2024 in [facility] for [facility] at [facility] Office Visit from 03/10/2023 in [facility] at [facility] Video Visit from 09/14/2022 in [facility] Video Visit from 08/04/2022 in [facility]  Total GAD-7 Score 0 0 6 5 4    PHQ2-9    Flowsheet Row Office Visit from 08/30/2024 in [facility] at [facility] Visit from 07/16/2024 in [facility] for [facility] at [facility] Office Visit from 04/05/2024 in [facility] at [facility] Visit from 11/04/2023 in [facility] at [facility]  Counselor from 10/13/2023 in [facility] at [facility]  PHQ-2 Total Score 0 1 0 3 6  PHQ-9 Total Score 0 4 4 14 22    Flowsheet Row UC from 05/23/2024 in [facility] at [facility] Video Visit from 01/26/2024 in [facility] at [facility] Office Visit from 11/04/2023 in [facility] at [facility]  C-SSRS RISK CATEGORY No Risk No Risk Error: Q3, 4, or 5 should not be populated when Q2 is No    Assessment and Plan: as follows  Prior documentation reviewed    Bipolar disorder current episode depressed : Mood is euthymic still somewhat stressed continue Latuda  she follows with PCP and regarding to her labs.  Continue gabapentin  she can take it 4 times a day for anxiety and mood symptoms She is also on Wellbutrin  that has been helping the depression part Generalized anxiety; gets anxious at times panic-like symptoms continue gabapentin  she wants to start propranolol  that has helped in the past we will start a small dose of 10 mg understands she needs to monitor her pulse and blood pressure and discuss side effects including dizziness and not to take in case she is feeling dizzy or low pulse  or blood pressure  ADHD; manageable understands that Adderall can cause anxiety as well discussed to take drug holidays not to increase the dose   insomnia; continue sleep hygiene and avoid taking Adderall or Wellbutrin  at night  Medication reviewed questions addressed she is also starting therapy and DBT classes next week highly recommend to be compliant with that and its recommendation to help cope with her anxiety  Follow-up in 1 month or earlier if needed   Collaboration of Care: Psychiatrist [facility] providers chart reviewed   Patient/Guardian was advised Release of Information must be obtained prior to any record release in order to collaborate their care with an outside  provider. Patient/Guardian was advised if they have not already done so to contact the registration department to sign all necessary forms in order for us  to release information regarding their care.   Consent: Patient/Guardian gives verbal consent for treatment and assignment of benefits for services provided during this visit. Patient/Guardian expressed understanding and agreed to proceed.   Jackey Flight, MD 11/10/20253:30 PM

## 2024-11-27 ENCOUNTER — Telehealth (HOSPITAL_COMMUNITY): Payer: Self-pay | Admitting: *Deleted

## 2024-11-27 MED ORDER — AMPHETAMINE-DEXTROAMPHET ER 20 MG PO CP24: 20.0000 mg | ORAL_CAPSULE | Freq: Every day | ORAL | 0 refills | Status: DC

## 2024-11-27 NOTE — Telephone Encounter (Signed)
 Patient called stating that [facility] is on back order for their Adderall XR and would like for provider to [facility] [address] [facility] [place]

## 2024-12-04 ENCOUNTER — Telehealth (HOSPITAL_COMMUNITY): Payer: Self-pay | Admitting: *Deleted

## 2024-12-04 DIAGNOSIS — F411 Generalized anxiety disorder: Secondary | ICD-10-CM

## 2024-12-04 DIAGNOSIS — F314 Bipolar disorder, current episode depressed, severe, without psychotic features: Secondary | ICD-10-CM

## 2024-12-04 MED ORDER — BUPROPION HCL ER (SR) 100 MG PO TB12: 100.0000 mg | ORAL_TABLET | Freq: Two times a day (BID) | ORAL | 0 refills | Status: DC

## 2024-12-04 MED ORDER — GABAPENTIN 100 MG PO CAPS: 100.0000 mg | ORAL_CAPSULE | Freq: Three times a day (TID) | ORAL | 0 refills | Status: AC

## 2024-12-04 NOTE — Telephone Encounter (Signed)
 Patient called stating she is needing refills for her Gabapentin  and her Wellbutrin . Per pt her next appt is in Jan and her last appt was 11/05/2024. [facility] [address] in [place].

## 2024-12-05 ENCOUNTER — Encounter: Payer: Self-pay | Admitting: Nurse Practitioner

## 2024-12-05 ENCOUNTER — Telehealth (HOSPITAL_COMMUNITY): Payer: MEDICAID | Admitting: Psychiatry

## 2024-12-05 ENCOUNTER — Ambulatory Visit: Payer: MEDICAID | Admitting: Nurse Practitioner

## 2024-12-05 VITALS — BP 98/68 | HR 99 | Temp 98.1°F | Ht 72.0 in | Wt 246.0 lb

## 2024-12-05 DIAGNOSIS — Z6833 Body mass index (BMI) 33.0-33.9, adult: Secondary | ICD-10-CM

## 2024-12-05 DIAGNOSIS — R5383 Other fatigue: Secondary | ICD-10-CM

## 2024-12-05 DIAGNOSIS — E66811 Obesity, class 1: Secondary | ICD-10-CM | POA: Diagnosis not present

## 2024-12-05 DIAGNOSIS — E039 Hypothyroidism, unspecified: Secondary | ICD-10-CM | POA: Diagnosis not present

## 2024-12-05 DIAGNOSIS — R748 Abnormal levels of other serum enzymes: Secondary | ICD-10-CM | POA: Diagnosis not present

## 2024-12-05 MED ORDER — NALTREXONE HCL 50 MG PO TABS: ORAL_TABLET | ORAL | 0 refills | Status: AC

## 2024-12-05 NOTE — Telephone Encounter (Signed)
 Patient is aware and verbalized understanding.

## 2024-12-05 NOTE — Progress Notes (Signed)
 Office: [phone]  /  Fax: [phone]  WEIGHT SUMMARY AND BIOMETRICS  Weight Lost Since Last Visit: 6lb  Weight Gained Since Last Visit: 0lb   Vitals Temp: 98.1 F (36.7 C) BP: 98/68 Pulse Rate: 99 SpO2: 97 %   Anthropometric Measurements Height: 6' (1.829 m) Weight: 246 lb (111.6 kg) BMI (Calculated): 33.36 Weight at Last Visit: 252lb Weight Lost Since Last Visit: 6lb Weight Gained Since Last Visit: 0lb Starting Weight: 288lb Total Weight Loss (lbs): 42 lb (19.1 kg)   Body Composition  Body Fat %: 42.9 % Fat Mass (lbs): 105.6 lbs Muscle Mass (lbs): 133.4 lbs Total Body Water (lbs): 88 lbs Visceral Fat Rating : 9   Other Clinical Data Fasting: No Labs: No Today's Visit #: 17 Starting Date: 04/06/23     HPI  Chief Complaint: OBESITY  Aminat is here to discuss her progress with her obesity treatment plan. She is on the the Category 4 Plan and states she is following her eating plan approximately 80 % of the time. She states she is exercising 30-45 minutes 4 days per week.   Interval History:  Since last office visit she has lost 6 pounds.  She reports she did well over Thanksgiving.  She is watching her portion sizes and is making healthier choices. She is drinking water and sparkling water daily.  She is doing barr once per week and cardio-bike 3 days per week.   Pharmacotherapy for weight loss: She is currently taking Wegovy  1.7mg  for medical weight loss.  Reports side effects of dry mouth/dry lips.  Has helped with polyphagia and cravings.  She took her last dose this past Saturday.    Previous pharmacotherapy for medical weight loss:  none   Bariatric surgery:  Patient has not had bariatric surgery.    Hypothyroidism Stable.  Does not report symptoms associated with uncontrolled hypothyroidism.  Reports fatigue Medication(s): Levothyroxine  25 mcg daily. Lab Results  Component Value Date   TSH 2.100 04/05/2024   Elevated liver  enzymes She had a CT abd with contrast  02/14/2007 that showed fatty liver.   CT abd with contrast 03/19/2009 showed an unremarkable liver  Last ALT was elevated 38     Latest Ref Rng & Units 05/23/2024   12:12 PM 03/10/2023    2:38 PM 09/15/2020   10:29 PM  CMP  Glucose 70 - 99 mg/dL 84  88  63   BUN 6 - 20 mg/dL 6  15  12    Creatinine 0.57 - 1.00 mg/dL 9.10  9.19  9.26   Sodium 134 - 144 mmol/L 140  139  141   Potassium 3.5 - 5.2 mmol/L 4.4  4.2  3.4   Chloride 96 - 106 mmol/L 102  101  105   CO2 20 - 29 mmol/L 22  22  24    Calcium 8.7 - 10.2 mg/dL 9.8  9.5  9.1   Total Protein 6.0 - 8.5 g/dL 7.0  6.9  6.8   Total Bilirubin 0.0 - 1.2 mg/dL 0.6  0.5  0.7   Alkaline Phos 44 - 121 IU/L 66  68  61   AST 0 - 40 IU/L 24  26  19    ALT 0 - 32 IU/L 38  29  22      PHYSICAL EXAM:  Blood pressure 98/68, pulse 99, temperature 98.1 F (36.7 C), height 6' (1.829 m), weight 246 lb (111.6 kg), SpO2 97%. Body mass index is 33.36 kg/m.  General: She is  overweight, cooperative, alert, well developed, and in no acute distress. PSYCH: Has normal mood, affect and thought process.   Extremities: No edema.  Neurologic: No gross sensory or motor deficits. No tremors or fasciculations noted.    DIAGNOSTIC DATA REVIEWED:  BMET    Component Value Date/Time   NA 140 05/23/2024 1212   K 4.4 05/23/2024 1212   CL 102 05/23/2024 1212   CO2 22 05/23/2024 1212   GLUCOSE 84 05/23/2024 1212   GLUCOSE 63 (L) 09/15/2020 2229   BUN 6 05/23/2024 1212   CREATININE 0.89 05/23/2024 1212   CALCIUM 9.8 05/23/2024 1212   GFRNONAA >60 09/15/2020 2229   GFRAA >60 09/15/2020 2229   Lab Results  Component Value Date   HGBA1C 4.8 03/10/2023   HGBA1C 4.4 (L) 07/09/2020   Lab Results  Component Value Date   INSULIN  6.0 04/06/2023   Lab Results  Component Value Date   TSH 2.100 04/05/2024   CBC    Component Value Date/Time   WBC 5.3 03/10/2023 1438   WBC 7.2 09/15/2020 2229   RBC 4.49 03/10/2023 1438    RBC 4.78 09/15/2020 2229   HGB 15.0 03/10/2023 1438   HCT 43.9 03/10/2023 1438   PLT 254 03/10/2023 1438   MCV 98 (H) 03/10/2023 1438   MCH 33.4 (H) 03/10/2023 1438   MCH 33.7 09/15/2020 2229   MCHC 34.2 03/10/2023 1438   MCHC 35.8 09/15/2020 2229   RDW 13.8 03/10/2023 1438   Iron Studies    Component Value Date/Time   IRON 133 08/30/2024 1131   TIBC 267 08/30/2024 1131   IRONPCTSAT 50 08/30/2024 1131   Lipid Panel     Component Value Date/Time   CHOL 152 05/23/2024 1212   TRIG 137 05/23/2024 1212   HDL 42 05/23/2024 1212   CHOLHDL 2.8 09/19/2023 1017   CHOLHDL 2.2 07/09/2020 1745   VLDL 29 07/09/2020 1745   LDLCALC 86 05/23/2024 1212   Hepatic Function Panel     Component Value Date/Time   PROT 7.0 05/23/2024 1212   ALBUMIN 4.5 05/23/2024 1212   AST 24 05/23/2024 1212   ALT 38 (H) 05/23/2024 1212   ALKPHOS 66 05/23/2024 1212   BILITOT 0.6 05/23/2024 1212   BILIDIR 0.1 07/09/2020 1745   IBILI 0.6 07/09/2020 1745      Component Value Date/Time   TSH 2.100 04/05/2024 1349   Nutritional Lab Results  Component Value Date   VD25OH 79.2 05/23/2024   VD25OH 41.3 03/10/2023   VD25OH 37.1 05/01/2018     ASSESSMENT AND PLAN  TREATMENT PLAN FOR OBESITY:  Recommended Dietary Goals  Skyeler is currently in the action stage of change. As such, her goal is to continue weight management plan. She has agreed to the Category 4 Plan.  Behavioral Intervention  We discussed the following Behavioral Modification Strategies today: increasing lean protein intake to established goals, decreasing simple carbohydrates , increasing vegetables, increasing fiber rich foods, increasing water intake , work on meal planning and preparation, reading food labels , keeping healthy foods at home, celebration eating strategies, continue to work on maintaining a reduced calorie state, getting the recommended amount of protein, incorporating whole foods, making healthy choices, staying  well hydrated and practicing mindfulness when eating., and increase protein intake, fibrous foods (25 grams per day for women, 30 grams for men) and water to improve satiety and decrease hunger signals. .  Additional resources provided today: NA  Recommended Physical Activity Goals  Sally-Ann has been advised to  work up to 150 minutes of moderate intensity aerobic activity a week and strengthening exercises 2-3 times per week for cardiovascular health, weight loss maintenance and preservation of muscle mass.   She has agreed to Think about enjoyable ways to increase daily physical activity and overcoming barriers to exercise, Increase physical activity in their day and reduce sedentary time (increase NEAT)., Start strengthening exercises with a goal of 2-3 sessions a week , Continue to gradually increase the amount and intensity of exercise routine, Increase volume of physical activity to a goal of 240 minutes a week, and Combine aerobic and strengthening exercises for efficiency and improved cardiometabolic health.   Pharmacotherapy We discussed various medication options to help [facility] with her weight loss efforts and we both agreed to start Naltrexone 50mg  1/2 tablet at dinnertime. Side effects discussed.  Avoid Phentermine and Qsymia due to taking Adderall Avoid Contrave due to taking Wellbutrin   Patient has been taking Wegovy  and has done well with weight loss. Unable to continue at this time due to cost.   ASSOCIATED CONDITIONS ADDRESSED TODAY  Action/Plan  Elevated liver enzymes -     CBC with Differential/Platelet -     Comprehensive metabolic panel with GFR -     Ambulatory referral to Gastroenterology for further eval of fatty liver  Acquired hypothyroidism -     TSH  Other fatigue -     CBC with Differential/Platelet -     Comprehensive metabolic panel with GFR  Obesity, Class I, BMI 30-34.9 -     Ambulatory referral to Gastroenterology -     Naltrexone HCl; Take  1/2 tablet at dinnertime  Dispense: 15 tablet; Refill: 0         Return in about 4 weeks (around 01/02/2025).SABRA She was informed of the importance of frequent follow up visits to maximize her success with intensive lifestyle modifications for her multiple health conditions.   ATTESTASTION STATEMENTS:  Reviewed by clinician on day of visit: allergies, medications, problem list, medical history, surgical history, family history, social history, and previous encounter notes.      Corean SAUNDERS. Merri Dimaano FNP-C

## 2024-12-06 ENCOUNTER — Ambulatory Visit: Payer: MEDICAID | Admitting: Nurse Practitioner

## 2024-12-06 LAB — COMPREHENSIVE METABOLIC PANEL WITH GFR
ALT: 25 IU/L (ref 0–32)
AST: 18 IU/L (ref 0–40)
Albumin: 4.9 g/dL (ref 3.9–4.9)
Alkaline Phosphatase: 68 IU/L (ref 41–116)
BUN/Creatinine Ratio: 16 (ref 9–23)
BUN: 15 mg/dL (ref 6–20)
Bilirubin Total: 0.7 mg/dL (ref 0.0–1.2)
CO2: 23 mmol/L (ref 20–29)
Calcium: 9.9 mg/dL (ref 8.7–10.2)
Chloride: 100 mmol/L (ref 96–106)
Creatinine, Ser: 0.91 mg/dL (ref 0.57–1.00)
Globulin, Total: 2.5 g/dL (ref 1.5–4.5)
Glucose: 94 mg/dL (ref 70–99)
Potassium: 4.7 mmol/L (ref 3.5–5.2)
Sodium: 137 mmol/L (ref 134–144)
Total Protein: 7.4 g/dL (ref 6.0–8.5)
eGFR: 85 mL/min/1.73 (ref >59)

## 2024-12-06 LAB — CBC WITH DIFFERENTIAL/PLATELET
Basophils Absolute: 0 x10E3/uL (ref 0.0–0.2)
Basos: 0 %
EOS (ABSOLUTE): 0.2 x10E3/uL (ref 0.0–0.4)
Eos: 2 %
Hematocrit: 47.6 % — ABNORMAL HIGH (ref 34.0–46.6)
Hemoglobin: 16.3 g/dL — ABNORMAL HIGH (ref 11.1–15.9)
Immature Grans (Abs): 0.1 x10E3/uL (ref 0.0–0.1)
Immature Granulocytes: 1 %
Lymphocytes Absolute: 1.4 x10E3/uL (ref 0.7–3.1)
Lymphs: 16 %
MCH: 33.5 pg — ABNORMAL HIGH (ref 26.6–33.0)
MCHC: 34.2 g/dL (ref 31.5–35.7)
MCV: 98 fL — ABNORMAL HIGH (ref 79–97)
Monocytes Absolute: 0.5 x10E3/uL (ref 0.1–0.9)
Monocytes: 6 %
Neutrophils Absolute: 6.1 x10E3/uL (ref 1.4–7.0)
Neutrophils: 75 %
Platelets: 295 x10E3/uL (ref 150–450)
RBC: 4.86 x10E6/uL (ref 3.77–5.28)
RDW: 11.8 % (ref 11.7–15.4)
WBC: 8.2 x10E3/uL (ref 3.4–10.8)

## 2024-12-06 LAB — TSH: TSH: 2.24 u[IU]/mL (ref 0.450–4.500)

## 2024-12-07 ENCOUNTER — Encounter: Payer: Self-pay | Admitting: Nurse Practitioner

## 2024-12-17 ENCOUNTER — Telehealth (HOSPITAL_COMMUNITY): Payer: Self-pay | Admitting: Psychiatry

## 2024-12-17 DIAGNOSIS — F3162 Bipolar disorder, current episode mixed, moderate: Secondary | ICD-10-CM

## 2024-12-17 MED ORDER — LURASIDONE HCL 120 MG PO TABS: 120.0000 mg | ORAL_TABLET | Freq: Every evening | ORAL | 0 refills | Status: AC

## 2024-12-17 NOTE — Telephone Encounter (Signed)
 Received fax from patient's pharmacy requesting 90-Day prescription for Lurasidone  HCl (LATUDA ) 120 MG TABS .    [id number] [place], [place] - [address] (Ph: [phone])   Last ordered: 09/05/2024 - 90 tablets Last visit: 11/05/2024 Next visit: 01/04/2025

## 2024-12-21 ENCOUNTER — Encounter: Payer: Self-pay | Admitting: Nurse Practitioner

## 2024-12-24 ENCOUNTER — Telehealth (HOSPITAL_COMMUNITY): Payer: Self-pay | Admitting: Psychiatry

## 2024-12-24 DIAGNOSIS — F3162 Bipolar disorder, current episode mixed, moderate: Secondary | ICD-10-CM

## 2024-12-24 DIAGNOSIS — F900 Attention-deficit hyperactivity disorder, predominantly inattentive type: Secondary | ICD-10-CM

## 2024-12-24 MED ORDER — AMPHETAMINE-DEXTROAMPHET ER 20 MG PO CP24: 20.0000 mg | ORAL_CAPSULE | Freq: Every day | ORAL | 0 refills | Status: DC

## 2024-12-24 NOTE — Telephone Encounter (Signed)
 Patient called requesting refill of amphetamine -dextroamphetamine  (ADDERALL XR) 20 MG 24 hr capsule .  [id number] - [place], [place] - [address] (Ph: [phone])   Last ordered: 11/27/2024 - 30 capsules Last visit: 11/05/2024 Next visit 01/04/2025

## 2024-12-24 NOTE — Telephone Encounter (Signed)
 I have sent Adderall 20 mg as requested, limited supply[facility]  [facility]. [facility] has upcoming appointment scheduled for 01/04/2025 with Dr. Geralene.

## 2024-12-28 ENCOUNTER — Other Ambulatory Visit (HOSPITAL_COMMUNITY): Payer: Self-pay | Admitting: Psychiatry

## 2024-12-28 DIAGNOSIS — F314 Bipolar disorder, current episode depressed, severe, without psychotic features: Secondary | ICD-10-CM

## 2025-01-01 ENCOUNTER — Telehealth (HOSPITAL_COMMUNITY): Payer: Self-pay | Admitting: *Deleted

## 2025-01-01 ENCOUNTER — Encounter: Payer: Self-pay | Admitting: Nurse Practitioner

## 2025-01-01 ENCOUNTER — Ambulatory Visit: Payer: MEDICAID | Admitting: Nurse Practitioner

## 2025-01-01 ENCOUNTER — Telehealth (INDEPENDENT_AMBULATORY_CARE_PROVIDER_SITE_OTHER): Payer: Self-pay

## 2025-01-01 VITALS — BP 111/72 | HR 73 | Ht 72.0 in | Wt 246.0 lb

## 2025-01-01 DIAGNOSIS — Z6833 Body mass index (BMI) 33.0-33.9, adult: Secondary | ICD-10-CM | POA: Diagnosis not present

## 2025-01-01 DIAGNOSIS — E66811 Obesity, class 1: Secondary | ICD-10-CM

## 2025-01-01 DIAGNOSIS — K7581 Nonalcoholic steatohepatitis (NASH): Secondary | ICD-10-CM | POA: Diagnosis not present

## 2025-01-01 DIAGNOSIS — R632 Polyphagia: Secondary | ICD-10-CM

## 2025-01-01 MED ORDER — WEGOVY 1 MG/0.5ML ~~LOC~~ SOAJ: 1.0000 mg | SUBCUTANEOUS | 0 refills | Status: DC

## 2025-01-01 NOTE — Progress Notes (Signed)
 "  Office: [phone]  /  Fax: [phone]  WEIGHT SUMMARY AND BIOMETRICS  Weight Lost Since Last Visit: 0  Weight Gained Since Last Visit: 0   Vitals BP: 111/72 Pulse Rate: 73 SpO2: 97 %   Anthropometric Measurements Height: 6' (1.829 m) Weight: 246 lb (111.6 kg) BMI (Calculated): 33.36 Weight at Last Visit: 246lb Weight Lost Since Last Visit: 0 Weight Gained Since Last Visit: 0 Starting Weight: 288lb Total Weight Loss (lbs): 42 lb (19.1 kg)   Body Composition  Body Fat %: 43 % Fat Mass (lbs): 105.8 lbs Muscle Mass (lbs): 133.2 lbs Total Body Water (lbs): 89.4 lbs Visceral Fat Rating : 9   Other Clinical Data Fasting: no Labs: no Today's Visit #: 18 Starting Date: 04/06/23     HPI  Chief Complaint: OBESITY  Wanda Torres is here to discuss her progress with her obesity treatment plan. She is on the the Category 4 Plan and states she is following her eating plan approximately 0 % of the time. She states she is exercising 0 minutes 0 days per week.   Interval History:  Since last office visit she has maintained her weight.  She celebrated Christmas and New Year's since her last visit.  She did well with maintaining her weight over the holidays.  She has also overall done well with weight loss with Wegovy , dietary changes and exercise.  She got off track with exercising over the holidays and is planning to start back now that the kids are back in school  She is drinking water and coffee.  Denies alcohol intake-she did report drinking one-two drinks over the holiday.    Pharmacotherapy for weight loss:  She is currently taking wegovy  0.5mg  and has one dose left.    Previous pharmacotherapy for medical weight loss:  Wegovy  and naltrexone  (    Bariatric surgery:  Patient has not had bariatric surgery.    MASH    Latest Ref Rng & Units 12/05/2024    3:28 PM 05/23/2024   12:12 PM 03/10/2023    2:38 PM  CMP  Glucose 70 - 99 mg/dL 94  84  88   BUN 6 - 20 mg/dL  15  6  15    Creatinine 0.57 - 1.00 mg/dL 9.08  9.10  9.19   Sodium 134 - 144 mmol/L 137  140  139   Potassium 3.5 - 5.2 mmol/L 4.7  4.4  4.2   Chloride 96 - 106 mmol/L 100  102  101   CO2 20 - 29 mmol/L 23  22  22    Calcium 8.7 - 10.2 mg/dL 9.9  9.8  9.5   Total Protein 6.0 - 8.5 g/dL 7.4  7.0  6.9   Total Bilirubin 0.0 - 1.2 mg/dL 0.7  0.6  0.5   Alkaline Phos 41 - 116 IU/L 68  66  68   AST 0 - 40 IU/L 18  24  26    ALT 0 - 32 IU/L 25  38  29      Fibrosis 4 Score = .41  Fib-4 interpretation is not validated for people under 35 or over 52 years of age. However, scores under 2.0 are generally considered low risk.   Patient saw GI last on 12/12/24 and is scheduled for a fibroscan on 01/08/25   PHYSICAL EXAM:  Blood pressure 111/72, pulse 73, height 6' (1.829 m), weight 246 lb (111.6 kg), SpO2 97%. Body mass index is 33.36 kg/m.  General: She is overweight, cooperative, alert,  well developed, and in no acute distress. PSYCH: Has normal mood, affect and thought process.   Extremities: No edema.  Neurologic: No gross sensory or motor deficits. No tremors or fasciculations noted.    DIAGNOSTIC DATA REVIEWED:  BMET    Component Value Date/Time   NA 137 12/05/2024 1528   K 4.7 12/05/2024 1528   CL 100 12/05/2024 1528   CO2 23 12/05/2024 1528   GLUCOSE 94 12/05/2024 1528   GLUCOSE 63 (L) 09/15/2020 2229   BUN 15 12/05/2024 1528   CREATININE 0.91 12/05/2024 1528   CALCIUM 9.9 12/05/2024 1528   GFRNONAA >60 09/15/2020 2229   GFRAA >60 09/15/2020 2229   Lab Results  Component Value Date   HGBA1C 4.8 03/10/2023   HGBA1C 4.4 (L) 07/09/2020   Lab Results  Component Value Date   INSULIN  6.0 04/06/2023   Lab Results  Component Value Date   TSH 2.240 12/05/2024   CBC    Component Value Date/Time   WBC 8.2 12/05/2024 1528   WBC 7.2 09/15/2020 2229   RBC 4.86 12/05/2024 1528   RBC 4.78 09/15/2020 2229   HGB 16.3 (H) 12/05/2024 1528   HCT 47.6 (H) 12/05/2024 1528    PLT 295 12/05/2024 1528   MCV 98 (H) 12/05/2024 1528   MCH 33.5 (H) 12/05/2024 1528   MCH 33.7 09/15/2020 2229   MCHC 34.2 12/05/2024 1528   MCHC 35.8 09/15/2020 2229   RDW 11.8 12/05/2024 1528   Iron Studies    Component Value Date/Time   IRON 133 08/30/2024 1131   TIBC 267 08/30/2024 1131   IRONPCTSAT 50 08/30/2024 1131   Lipid Panel     Component Value Date/Time   CHOL 152 05/23/2024 1212   TRIG 137 05/23/2024 1212   HDL 42 05/23/2024 1212   CHOLHDL 2.8 09/19/2023 1017   CHOLHDL 2.2 07/09/2020 1745   VLDL 29 07/09/2020 1745   LDLCALC 86 05/23/2024 1212   Hepatic Function Panel     Component Value Date/Time   PROT 7.4 12/05/2024 1528   ALBUMIN 4.9 12/05/2024 1528   AST 18 12/05/2024 1528   ALT 25 12/05/2024 1528   ALKPHOS 68 12/05/2024 1528   BILITOT 0.7 12/05/2024 1528   BILIDIR 0.1 07/09/2020 1745   IBILI 0.6 07/09/2020 1745      Component Value Date/Time   TSH 2.240 12/05/2024 1527   Nutritional Lab Results  Component Value Date   VD25OH 79.2 05/23/2024   VD25OH 41.3 03/10/2023   VD25OH 37.1 05/01/2018     ASSESSMENT AND PLAN  TREATMENT PLAN FOR OBESITY:  Recommended Dietary Goals  Wanda Torres is currently in the action stage of change. As such, her goal is to continue weight management plan. She has agreed to the Category 4 Plan.  Behavioral Intervention  We discussed the following Behavioral Modification Strategies today: increasing lean protein intake to established goals, decreasing simple carbohydrates , increasing vegetables, increasing fiber rich foods, increasing water intake , reading food labels , keeping healthy foods at home, planning for success, continue to work on maintaining a reduced calorie state, getting the recommended amount of protein, incorporating whole foods, making healthy choices, staying well hydrated and practicing mindfulness when eating., and increase protein intake, fibrous foods (25 grams per day for women, 30 grams for  men) and water to improve satiety and decrease hunger signals. .  Additional resources provided today: NA  Recommended Physical Activity Goals  Wanda Torres has been advised to work up to 150 minutes of moderate intensity  aerobic activity a week and strengthening exercises 2-3 times per week for cardiovascular health, weight loss maintenance and preservation of muscle mass.   She has agreed to Think about enjoyable ways to increase daily physical activity and overcoming barriers to exercise, Increase physical activity in their day and reduce sedentary time (increase NEAT)., Work on scheduling and tracking physical activity. , Increase volume of physical activity to a goal of 240 minutes a week, and Combine aerobic and strengthening exercises for efficiency and improved cardiometabolic health.   Pharmacotherapy We discussed various medication options to help Wanda Torres with her weight loss efforts and we both agreed to increase Wegovy  1mg .  Side effect discussed.  ASSOCIATED CONDITIONS ADDRESSED TODAY  Action/Plan  Metabolic dysfunction-associated steatohepatitis (MASH) -     Wegovy ; Inject 1 mg into the skin once a week.  Dispense: 2 mL; Refill: 0  Polyphagia -     Wegovy ; Inject 1 mg into the skin once a week.  Dispense: 2 mL; Refill: 0  Obesity, Class I, BMI 30-34.9 -     Wegovy ; Inject 1 mg into the skin once a week.  Dispense: 2 mL; Refill: 0         Return in about 4 weeks (around 01/29/2025).Wanda Torres She was informed of the importance of frequent follow up visits to maximize her success with intensive lifestyle modifications for her multiple health conditions.   ATTESTASTION STATEMENTS:  Reviewed by clinician on day of visit: allergies, medications, problem list, medical history, surgical history, family history, social history, and previous encounter notes.     Wanda Torres. Rushton Early FNP-C "

## 2025-01-01 NOTE — Telephone Encounter (Signed)
 Prior auth started for Wegovy  1 mg.  Awaiting questions.

## 2025-01-01 NOTE — Telephone Encounter (Signed)
 lmom

## 2025-01-01 NOTE — Telephone Encounter (Signed)
 Patient pharmacy [facility] off [address] in [place] is requesting 90 days supply for patient Propranolol .  Medication was last filled on 11/05/2024 with 30 tab Patient was last seen 01/04/2025

## 2025-01-02 ENCOUNTER — Other Ambulatory Visit: Payer: Self-pay | Admitting: Nurse Practitioner

## 2025-01-02 DIAGNOSIS — E66811 Obesity, class 1: Secondary | ICD-10-CM

## 2025-01-02 DIAGNOSIS — R632 Polyphagia: Secondary | ICD-10-CM

## 2025-01-02 DIAGNOSIS — K7581 Nonalcoholic steatohepatitis (NASH): Secondary | ICD-10-CM

## 2025-01-02 MED ORDER — PROPRANOLOL HCL 10 MG PO TABS: 10.0000 mg | ORAL_TABLET | Freq: Every day | ORAL | 0 refills | Status: DC | PRN

## 2025-01-02 NOTE — Telephone Encounter (Signed)
 Pt has been notified via my chart.    DECISION ON YOUR REQUEST FOR SERVICES Notice Date: 01/02/2025 PA #: [id number] This Action will take effect on: 01/02/2025 Call [phone] for help [facility] [address] [place], [place] [address] [facility] [address] [place], [place] [address] MID: [id number] MALVA [id number] O Member: ALMARIE LAKES DOB: 11-19-1991 [facility] manages your pharmacy services. On 01/02/2025, you or your provider asked us  to approve your request for healthcare services or items. WE DENIED YOUR REQUEST FOR PHARMACY SERVICES. IF YOU DO NOT AGREE WITH OUR DECISION, YOU CAN APPEAL IT. (You can start this yourself with your insurance company) This letter tells you about our decision. Please read it carefully. You can ask for an Appeal by mail, by fax, by phone or in-person. There are instructions in this Notice that will tell you what to do. Please read them carefully. The last day to ask for an Appeal is 03/03/2025. You have 60 calendar days from the date on this Notice to ask for an Appeal. If you need help filing your appeal, call us  at [phone]. Name: LISANDRA MATHISEN MID: [id number] O Decision Date: 01/02/2025 ROSINE SOCKS FOR: Service Description Code 1 Code 2 Plan Requested Dates Requested Amount Wegovy  Pen (semaglutide ) [facility] 01/02/2025 WE DENIED: Service Description Code 1 Code 2 Plan Denied Dates Denied Amount Wegovy  Pen (semaglutide ) [facility] 01/02/2025 COMMENTS: A licensed [place] Pharmacist with appropriate clinical expertise reviewed this request and used criteria found in GLP-1 Receptor [employer]on[employer] - Zepbound Wegovy  [facility] [place] [employer] to make this decision. We denied your request for:  Wegovy  Pen (semaglutide ) Policy rules found at GLP-1 Receptor Agonists - Zepbound Wegovy  [facility] [place] Medicaid guided our decision. Here are the policy requirements your request did not meet: The service  is not medically necessary. 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 Your request for Wegovy  Pen (semaglutide ) cannot be approved unless you have stage F1, F2 or F3 fibrosis confirmed by FIB-4 Score adjusted for age (prior to therapy with the requested medication)

## 2025-01-04 ENCOUNTER — Encounter (HOSPITAL_COMMUNITY): Payer: Self-pay | Admitting: Psychiatry

## 2025-01-04 ENCOUNTER — Telehealth (INDEPENDENT_AMBULATORY_CARE_PROVIDER_SITE_OTHER): Payer: MEDICAID | Admitting: Psychiatry

## 2025-01-04 DIAGNOSIS — F411 Generalized anxiety disorder: Secondary | ICD-10-CM | POA: Diagnosis not present

## 2025-01-04 DIAGNOSIS — G4700 Insomnia, unspecified: Secondary | ICD-10-CM | POA: Diagnosis not present

## 2025-01-04 DIAGNOSIS — F319 Bipolar disorder, unspecified: Secondary | ICD-10-CM | POA: Diagnosis not present

## 2025-01-04 DIAGNOSIS — F410 Panic disorder [episodic paroxysmal anxiety] without agoraphobia: Secondary | ICD-10-CM

## 2025-01-04 DIAGNOSIS — F900 Attention-deficit hyperactivity disorder, predominantly inattentive type: Secondary | ICD-10-CM

## 2025-01-04 DIAGNOSIS — F314 Bipolar disorder, current episode depressed, severe, without psychotic features: Secondary | ICD-10-CM

## 2025-01-04 MED ORDER — AMPHETAMINE-DEXTROAMPHET ER 20 MG PO CP24: 20.0000 mg | ORAL_CAPSULE | Freq: Every day | ORAL | 0 refills | Status: AC

## 2025-01-04 NOTE — Progress Notes (Signed)
 " [facility] Follow up visit  Patient Identification: Wanda Torres MRN:  [id number] Date of Evaluation:  01/04/2025 Referral Source: [facility] . Dr. Lynnette Chief Complaint:   No chief complaint on file. Follow up depression  Visit Diagnosis:    ICD-10-CM   1. Bipolar disorder, current episode depressed, severe, without psychotic features (HCC)  F31.4     2. Attention deficit hyperactivity disorder (ADHD), predominantly inattentive type  F90.0 amphetamine -dextroamphetamine  (ADDERALL XR) 20 MG 24 hr capsule    3. Generalized anxiety disorder  F41.1     4. Panic attacks  F41.0     Virtual Visit via Video Note  I connected with Wanda Torres on 01/04/2025 at  9:00 AM EST by a video enabled telemedicine application and verified that I am speaking with the correct person using two identifiers.  Location: Patient: home Provider: home office   I discussed the limitations of evaluation and management by telemedicine and the availability of in person appointments. The patient expressed understanding and agreed to proceed.     I discussed the assessment and treatment plan with the patient. The patient was provided an opportunity to ask questions and all were answered. The patient agreed with the plan and demonstrated an understanding of the instructions.   The patient was advised to call back or seek an in-person evaluation if the symptoms worsen or if the condition fails to improve as anticipated.  I provided 18 minutes of non-face-to-face time during this encounter.    History of Present Illness: Patient is a 34  years old white female currently living with her fianc initially referred by [facility] as she is a resident of [facility] in [place] so transferred from [facility] patient.  Diagnosed with bipolar, ADD, anxiety.  Patient currently living with her fianc she has 3 kids were not living with her and 2 of the kids are with her ex and 1 kid is with her sister and was going thru  custody concerns   Last visit she was having anxiety and we have adjusted or added Inderal  that has helped she continues take Latuda  and medication and following up with primary care physician regarding labs last LDL was normal She has also had a therapy and DBT that is helping for impulsivity or mood symptoms She has joint custody and some concerns but overall stress is manageable as of now and had a good holiday season She understands to check her blood pressure and to report if there is any dizziness no reported side effects of tremors or involuntary movements  She is fianc is supportive  Aggravating factors; custody concerns related to kids Modifying factors; her kids, fianc animals  Duration more than 10 years Severity improved Past Psychiatric History: Bipolar, anxiety, depression  Previous Psychotropic Medications: Yes  Wellbutrin , cymbalta , zoloft  . Says didn't work  Substance Abuse History in the last 12 months:  Yes.    Consequences of Substance Abuse: Has been drinking till last 2 weeks, discussed abstinence and alcohol effect to depression, judjement  Past Medical History:  Past Medical History:  Diagnosis Date   Acne    ADD (attention deficit disorder)    Alcohol abuse    Allergy    Anemia    Anxiety    ASCUS with positive high risk human papillomavirus of vagina 05/2022   Bipolar 1 disorder (HCC)    Depression    Depression    Drug use    Hypothyroidism    Joint pain  PCOS (polycystic ovarian syndrome)    Psoriasis    Swelling of lower extremity    Vitamin B 12 deficiency    Vitamin D  deficiency     Past Surgical History:  Procedure Laterality Date   COLPOSCOPY  07/2022   ENDOCERVICAL MUCOSA WITH CHRONIC CERVICITIS.   NO SQUAMOUS MUCOSA IDENTIFIED.   NO EVIDENCE OF DYSPLASIA OR MALIGNANCY.    Family Psychiatric History: Aunt : bipolar, Mom; depression  Family History:  Family History  Problem Relation Age of Onset   Anxiety disorder Mother     Depression Mother    Sexual abuse Mother    Hypothyroidism Mother    High blood pressure Mother    High Cholesterol Mother    Drug abuse Father    ADD / ADHD Sister    Anxiety disorder Sister    Bipolar disorder Sister    Depression Sister    Drug abuse Sister    Sexual abuse Sister    Anxiety disorder Brother    Depression Brother    Sexual abuse Brother    Colon cancer Maternal Grandmother        Thyroid   Thyroid disease Maternal Grandmother        THyroid cancer   Depression Maternal Grandmother    Sexual abuse Maternal Grandmother    Alcohol abuse Maternal Grandfather    Dementia Maternal Grandfather    Depression Maternal Grandfather    Sexual abuse Maternal Grandfather    Alcohol abuse Maternal Aunt    Bipolar disorder Maternal Aunt    Depression Maternal Aunt    Sexual abuse Maternal Aunt    Bipolar disorder Maternal Aunt    Depression Maternal Aunt    Sexual abuse Maternal Aunt    ADD / ADHD Maternal Uncle    Alcohol abuse Maternal Uncle    Drug abuse Maternal Uncle    Sexual abuse Maternal Uncle    Breast cancer Neg Hx    Ovarian cancer Neg Hx    Endometrial cancer Neg Hx     Social History:   Social History   Socioeconomic History   Marital status: Divorced    Spouse name: Not on file   Number of children: 4   Years of education: Not on file   Highest education level: Some college, no degree  Occupational History    Comment: Supervisor reservations; 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[employer]  Tobacco Use   Smoking status: Never   Smokeless tobacco: Never   Tobacco comments:    Vaps daily   Vaping Use   Vaping status: Every Day   Substances: Nicotine   Substance and Sexual Activity   Alcohol use: Not Currently    Alcohol/week: 1.0 standard drink of alcohol    Types: 1 Standard drinks or equivalent per week    Comment: reports one a week   Drug use: Never   Sexual activity: Yes    Comment: Partner-vasectomy  Other Topics Concern   Not on file  Social  History Narrative   Not on file   Social Drivers of Health   Tobacco Use: Low Risk (01/04/2025)   Patient History    Smoking Tobacco Use: Never    Smokeless Tobacco Use: Never    Passive Exposure: Not on file  Financial Resource Strain: Low Risk (12/12/2024)   Received from [facility]   Overall Financial Resource Strain (CARDIA)    How hard is it for you to pay for the very basics like food, housing, medical care, and heating?:  Not hard at all  Food Insecurity: No Food Insecurity (12/12/2024)   Received from [facility]   Epic    Within the past 12 months, you worried that your food would run out before you got the money to buy more.: Never true    Within the past 12 months, the food you bought just didn't last and you didn't have money to get more.: Never true  Transportation Needs: No Transportation Needs (12/12/2024)   Received from [facility]    In the past 12 months, has lack of transportation kept you from medical appointments or from getting medications?: No    In the past 12 months, has lack of transportation kept you from meetings, work, or from getting things needed for daily living?: No  Physical Activity: Insufficiently Active (04/04/2024)   Exercise Vital Sign    Days of Exercise per Week: 3 days    Minutes of Exercise per Session: 30 min  Stress: No Stress Concern Present (04/04/2024)   [employer] of Occupational Health - Occupational Stress Questionnaire    Feeling of Stress : Not at all  Social Connections: Moderately Isolated (04/04/2024)   Social Connection and Isolation Panel    Frequency of Communication with Friends and Family: More than three times a week    Frequency of Social Gatherings with Friends and Family: Once a week    Attends Religious Services: Never    Database Administrator or Organizations: No    Attends Engineer, Structural: Not on file    Marital Status: Living with partner  Depression (PHQ2-9): Low Risk (08/30/2024)    Depression (PHQ2-9)    PHQ-2 Score: 0  Alcohol Screen: Low Risk (04/04/2024)   Alcohol Screen    Last Alcohol Screening Score (AUDIT): 5  Housing: Low Risk (12/12/2024)   Received from [facility]    In the last 12 months, was there a time when you were not able to pay the mortgage or rent on time?: No    In the past 12 months, how many times have you moved where you were living?: 0    At any time in the past 12 months, were you homeless or living in a shelter (including now)?: No  Utilities: Not At Risk (12/12/2024)   [facility]    In the past 12 months has the electric, gas, oil, or water company threatened to shut off services in your home?: No  Health Literacy: Not on file     Allergies:  No Known Allergies  Metabolic Disorder Labs: Lab Results  Component Value Date   HGBA1C 4.8 03/10/2023   MPG 79.58 07/09/2020   No results found for: PROLACTIN Lab Results  Component Value Date   CHOL 152 05/23/2024   TRIG 137 05/23/2024   HDL 42 05/23/2024   CHOLHDL 2.8 09/19/2023   VLDL 29 07/09/2020   LDLCALC 86 05/23/2024   LDLCALC 89 09/19/2023   Lab Results  Component Value Date   TSH 2.240 12/05/2024    Therapeutic Level Labs: No results found for: LITHIUM No results found for: CBMZ No results found for: VALPROATE  Current Medications: Current Outpatient Medications  Medication Sig Dispense Refill   amphetamine -dextroamphetamine  (ADDERALL XR) 20 MG 24 hr capsule Take 1 capsule (20 mg total) by mouth daily. 30 capsule 0   baclofen  (LIORESAL ) 10 MG tablet Take 1 tablet (10 mg total) by mouth 3 (three) times daily. 30  each 1   buPROPion  ER (WELLBUTRIN  SR) 100 MG 12 hr tablet TAKE 1 TABLET BY MOUTH TWICE A DAY 180 tablet 0   Cetirizine HCl (ZYRTEC ALLERGY PO) Take by mouth.     clotrimazole -betamethasone  (LOTRISONE ) cream Apply 2x a day for a week then 1 time a day for a week. 30 g 1   Esomeprazole Magnesium  (NEXIUM PO) Take by  mouth.     gabapentin  (NEURONTIN ) 100 MG capsule Take 1 capsule (100 mg total) by mouth 3 (three) times daily. 270 capsule 0   levothyroxine  (SYNTHROID ) 25 MCG tablet Take 1 tablet (25 mcg total) by mouth daily. 90 tablet 3   Lurasidone  HCl (LATUDA ) 120 MG TABS Take 1 tablet (120 mg total) by mouth every evening. Take with food 90 tablet 0   meloxicam  (MOBIC ) 15 MG tablet Take 1 tablet (15 mg total) by mouth daily. Take with food 30 tablet 0   naltrexone  (DEPADE) 50 MG tablet Take 1/2 tablet at dinnertime 15 tablet 0   nystatin  (MYCOSTATIN /NYSTOP ) powder Apply 2x a days as needed 15 g 0   propranolol  (INDERAL ) 10 MG tablet Take 1 tablet (10 mg total) by mouth daily as needed. 30 tablet 0   semaglutide -weight management (WEGOVY ) 1 MG/0.5ML SOAJ SQ injection Inject 1 mg into the skin once a week. 2 mL 0   No current facility-administered medications for this visit.     Psychiatric Specialty Exam: Review of Systems  Cardiovascular:  Negative for chest pain.  Neurological:  Negative for tremors.    There were no vitals taken for this visit.There is no height or weight on file to calculate BMI.  General Appearance: Casual  Eye Contact:  Fair  Speech:  Slow  Volume:  Decreased  Mood: Fair  Affect:  Congruent  Thought Process:  Goal Directed  Orientation:  Full (Time, Place, and Person)  Thought Content:  Rumination  Suicidal Thoughts:  No  Homicidal Thoughts:  No  Memory:  Immediate;   Fair  Judgement:  Fair  Insight:  Shallow  Psychomotor Activity:  Decreased  Concentration:  Concentration: Fair  Recall:  [employer] of Knowledge:Fair  Language: Fair  Akathisia:  No  Handed:    AIMS (if indicated):  no involuntary movements   Assets:  Desire for Improvement  ADL's:  Intact  Cognition: WNL  Sleep:  Fair   Screenings: AIMS    Flowsheet Row Ad[facility] (Discharged) [facility]  AIMS Total Score 0   AUDIT     Flowsheet Row Office Visit from 04/05/2024 in [facility] from 03/24/2023 in [facility] (Discharged[facility] Admission (Discharged) from [facility]  Alcohol Use Disorder Identification Test Final Score (AUDIT) 5  5 2 3    GAD-7    Flowsheet Row Office Visit from 08/30/2024 in [facility] at [facility] Visit from 07/16/2024 in [facility] at [facility] Office Visit from 03/10/2023 in [facility] at [facility] Video Visit from 09/14/2022 in [facility] Video Visit from 08/04/2022 in [facility]  Total GAD-7 Score 0 0 6 5 4    PHQ2-9    Flowsheet Row Office Visit from 08/30/2024 in [facility]  Office Visit from 07/16/2024 in [facility] at [employer] [facility] Office Visit from 04/05/2024 in [facility] Care at [facility] Visit from 11/04/2023 in [facility] at [facility] Counselor from 10/13/2023 in [facility] Out[facility] Health at [facility]  PHQ-2 Total Score 0 1 0 3 6  PHQ-9 Total Score 0 4 4 14 22    Flowsheet Row Video Visit from 11/05/2024 in [facility] at [facility] from 05/23/2024 in [facility] at [facility] Video Visit from 01/26/2024 in [facility] at [facility]  C-SSRS RISK CATEGORY No Risk No Risk No Risk    Assessment and Plan: as follows  Prior documentation reviewed    Bipolar disorder current episode depressed : Mood is euthymic continue Latuda  and Wellbutrin  she is also on gabapentin  it helps mood stabilization  and anxiety follow-up with primary care physician regarding the labs   Generalized anxiety; improved continue gabapentin  and also she is on small dose of Inderal  that has helped continue therapy   ADHD; manageable with Adderall do take drug holidays no reported headaches or insomnia   insomnia; manageable continue sleep hygiene   Medication reviewed and questions addressed follow-up in 3 months or earlier if needed   Collaboration of Care: Psychiatrist [facility] providers chart reviewed   Patient/Guardian was advised Release of Information must be obtained prior to any record release in order to collaborate their care with an outside provider. Patient/Guardian was advised if they have not already done so to contact the registration department to sign all necessary forms in order for us  to release information regarding their care.   Consent: Patient/Guardian gives verbal consent for treatment and assignment of benefits for services provided during this visit. Patient/Guardian expressed understanding and agreed to proceed.   Jackey Flight, MD 1/9/20269:13 AM  "

## 2025-01-07 ENCOUNTER — Telehealth: Payer: Self-pay

## 2025-01-07 ENCOUNTER — Encounter: Payer: Self-pay | Admitting: Nurse Practitioner

## 2025-01-07 DIAGNOSIS — K7581 Nonalcoholic steatohepatitis (NASH): Secondary | ICD-10-CM

## 2025-01-07 DIAGNOSIS — E66811 Obesity, class 1: Secondary | ICD-10-CM

## 2025-01-07 DIAGNOSIS — R632 Polyphagia: Secondary | ICD-10-CM

## 2025-01-07 NOTE — Telephone Encounter (Signed)
 PA submitted rhrough Cover My Meds for [employer] . Awaiting insurance determination. Key: [id number]

## 2025-01-09 NOTE — Telephone Encounter (Signed)
 Received through Cover My Meds: [employer]  has been approved through 01/09/26.

## 2025-01-10 MED ORDER — WEGOVY 1 MG/0.5ML ~~LOC~~ SOAJ: 1.0000 mg | SUBCUTANEOUS | 0 refills | Status: AC

## 2025-01-21 ENCOUNTER — Other Ambulatory Visit: Payer: Self-pay | Admitting: Family Medicine

## 2025-01-21 DIAGNOSIS — E039 Hypothyroidism, unspecified: Secondary | ICD-10-CM

## 2025-01-29 ENCOUNTER — Telehealth (HOSPITAL_COMMUNITY): Payer: Self-pay | Admitting: Psychiatry

## 2025-01-29 MED ORDER — PROPRANOLOL HCL 10 MG PO TABS: 10.0000 mg | ORAL_TABLET | Freq: Every day | ORAL | 0 refills | Status: AC | PRN

## 2025-01-29 MED ORDER — PROPRANOLOL HCL 10 MG PO TABS: 10.0000 mg | ORAL_TABLET | Freq: Every day | ORAL | 0 refills | Status: DC | PRN

## 2025-01-29 NOTE — Addendum Note (Signed)
 Addended by: Amandamarie Feggins on: 01/29/2025 09:39 AM   Modules accepted: Orders

## 2025-02-04 ENCOUNTER — Ambulatory Visit: Payer: MEDICAID | Admitting: Nurse Practitioner

## 2025-02-27 ENCOUNTER — Ambulatory Visit: Payer: MEDICAID | Admitting: Family Medicine

## 2025-04-05 ENCOUNTER — Telehealth (HOSPITAL_COMMUNITY): Payer: MEDICAID | Admitting: Psychiatry

## 2025-04-10 ENCOUNTER — Ambulatory Visit: Payer: MEDICAID | Admitting: Physician Assistant
